# Patient Record
Sex: Male | Born: 1960 | Race: White | Hispanic: No | Marital: Married | Smoking: Never smoker
Health system: Southern US, Community
[De-identification: ages and names within clinical notes are randomized; demographics above are authoritative.]

## PROBLEM LIST (undated history)

## (undated) DIAGNOSIS — C73 Malignant neoplasm of thyroid gland: Secondary | ICD-10-CM

## (undated) DIAGNOSIS — F4310 Post-traumatic stress disorder, unspecified: Secondary | ICD-10-CM

## (undated) DIAGNOSIS — I10 Essential (primary) hypertension: Secondary | ICD-10-CM

## (undated) DIAGNOSIS — T8859XA Other complications of anesthesia, initial encounter: Secondary | ICD-10-CM

## (undated) DIAGNOSIS — C61 Malignant neoplasm of prostate: Secondary | ICD-10-CM

## (undated) DIAGNOSIS — T7840XA Allergy, unspecified, initial encounter: Secondary | ICD-10-CM

## (undated) DIAGNOSIS — J301 Allergic rhinitis due to pollen: Secondary | ICD-10-CM

## (undated) DIAGNOSIS — T148XXA Other injury of unspecified body region, initial encounter: Secondary | ICD-10-CM

## (undated) DIAGNOSIS — H9193 Unspecified hearing loss, bilateral: Secondary | ICD-10-CM

## (undated) DIAGNOSIS — J449 Chronic obstructive pulmonary disease, unspecified: Secondary | ICD-10-CM

## (undated) DIAGNOSIS — K219 Gastro-esophageal reflux disease without esophagitis: Secondary | ICD-10-CM

## (undated) DIAGNOSIS — L570 Actinic keratosis: Secondary | ICD-10-CM

## (undated) DIAGNOSIS — J45909 Unspecified asthma, uncomplicated: Secondary | ICD-10-CM

## (undated) DIAGNOSIS — T4145XA Adverse effect of unspecified anesthetic, initial encounter: Secondary | ICD-10-CM

## (undated) DIAGNOSIS — E785 Hyperlipidemia, unspecified: Secondary | ICD-10-CM

## (undated) HISTORY — DX: Chronic obstructive pulmonary disease, unspecified: J44.9

## (undated) HISTORY — DX: Essential (primary) hypertension: I10

## (undated) HISTORY — PX: INGUINAL HERNIA REPAIR: SUR1180

## (undated) HISTORY — DX: Malignant neoplasm of prostate: C61

## (undated) HISTORY — PX: THYROIDECTOMY, PARTIAL: SHX18

## (undated) HISTORY — DX: Hyperlipidemia, unspecified: E78.5

## (undated) HISTORY — DX: Actinic keratosis: L57.0

## (undated) HISTORY — PX: COLONOSCOPY W/ POLYPECTOMY: SHX1380

## (undated) HISTORY — DX: Malignant neoplasm of thyroid gland: C73

## (undated) HISTORY — DX: Post-traumatic stress disorder, unspecified: F43.10

## (undated) HISTORY — DX: Allergic rhinitis due to pollen: J30.1

## (undated) HISTORY — DX: Unspecified asthma, uncomplicated: J45.909

## (undated) HISTORY — DX: Allergy, unspecified, initial encounter: T78.40XA

## (undated) HISTORY — DX: Gastro-esophageal reflux disease without esophagitis: K21.9

---

## 1998-06-20 ENCOUNTER — Emergency Department (HOSPITAL_COMMUNITY): Admission: EM | Admit: 1998-06-20 | Discharge: 1998-06-20 | Payer: Self-pay | Admitting: Emergency Medicine

## 1998-06-20 ENCOUNTER — Encounter: Payer: Self-pay | Admitting: Emergency Medicine

## 2003-02-23 ENCOUNTER — Encounter: Payer: Self-pay | Admitting: *Deleted

## 2003-02-23 ENCOUNTER — Ambulatory Visit (HOSPITAL_COMMUNITY): Admission: RE | Admit: 2003-02-23 | Discharge: 2003-02-23 | Payer: Self-pay | Admitting: *Deleted

## 2004-03-27 ENCOUNTER — Ambulatory Visit (HOSPITAL_COMMUNITY): Admission: RE | Admit: 2004-03-27 | Discharge: 2004-03-27 | Payer: Self-pay | Admitting: *Deleted

## 2007-03-30 ENCOUNTER — Ambulatory Visit: Payer: Self-pay | Admitting: Unknown Physician Specialty

## 2008-07-11 ENCOUNTER — Ambulatory Visit: Payer: Self-pay | Admitting: Unknown Physician Specialty

## 2011-01-29 ENCOUNTER — Ambulatory Visit (INDEPENDENT_AMBULATORY_CARE_PROVIDER_SITE_OTHER): Payer: 59 | Admitting: Cardiovascular Disease

## 2011-01-29 ENCOUNTER — Encounter: Payer: Self-pay | Admitting: Cardiovascular Disease

## 2011-01-29 DIAGNOSIS — E785 Hyperlipidemia, unspecified: Secondary | ICD-10-CM

## 2011-01-29 DIAGNOSIS — I10 Essential (primary) hypertension: Secondary | ICD-10-CM

## 2011-01-29 MED ORDER — ATORVASTATIN CALCIUM 40 MG PO TABS: 40.0000 mg | ORAL_TABLET | Freq: Every day | ORAL | Status: DC

## 2011-01-29 MED ORDER — AMLODIPINE BESYLATE 10 MG PO TABS: 10.0000 mg | ORAL_TABLET | Freq: Every day | ORAL | Status: DC

## 2011-01-29 MED ORDER — LOSARTAN POTASSIUM 100 MG PO TABS: 100.0000 mg | ORAL_TABLET | Freq: Every day | ORAL | Status: DC

## 2011-01-29 NOTE — Assessment & Plan Note (Signed)
We will start him on losartan 100 mg daily. Have asked him to monitor his blood pressure closely. If he continues to be elevated, he can start amlodipine 5 mg daily, titrating to 10 mg daily if needed.

## 2011-01-29 NOTE — Assessment & Plan Note (Signed)
We will start Lipitor 20 mg daily, check of his cholesterol in 2 months and possible titration to 40 mg if needed.

## 2011-01-29 NOTE — Patient Instructions (Addendum)
You are doing well. Please start losartan 100 mg daily If blood pressure continues to be elevated after 5 day, start amlodipine 5 mg daily. Advance to 10 mg if BP continue to be high. Start lipitor 1/2 tab once blood pressure is well controlled.  Please call us if you have new issues that need to be addressed before your next appt.  Follow up in one year

## 2011-01-29 NOTE — Progress Notes (Signed)
   Patient ID: Austin Houston, male    DOB: 03-Dec-1960, 50 y.o.   MRN: [id number]  HPI Comments: Mr. Austin Houston, also known as "Austin Houston",  Works in the cardiac Cath Lab at [employer] with a history of hypertension and hyperlipidemia with no known coronary artery disease who presents to establish care. He was previously seen by myself at [facility].  He reports that he is doing well. He had come off his medications including atenolol, Vytorin and irbesartan/Avapro. He reports that his blood pressure had been well controlled. Recently, his blood pressure has been elevated. He would like to restart medications. He does take aspirin and Prilosec.  He reports that his triglycerides are 201, total cholesterol 222, LDL 112, HDL 70.  EKG shows normal sinus rhythm with rate 70 beats per minute with no significant ST-T wave changes, left axis deviation   Outpatient Encounter Prescriptions as of 01/29/2011  Medication Sig Dispense Refill  . Amoxicillin-Pot Clavulanate (AUGMENTIN PO) Take 875 mg by mouth once. Take one tablet daily for 10 days.       Marland Kitchen aspirin 81 MG tablet Take 81 mg by mouth daily.           Review of Systems  Constitutional: Negative.   HENT: Negative.   Eyes: Negative.   Respiratory: Negative.   Cardiovascular: Negative.   Gastrointestinal: Negative.   Musculoskeletal: Negative.   Skin: Negative.   Neurological: Negative.   Hematological: Negative.   Psychiatric/Behavioral: Negative.   All other systems reviewed and are negative.    BP 160/110  Pulse 92  Ht 6' (1.829 m)  Wt 214 lb (97.07 kg)  BMI 29.02 kg/m2  Physical Exam  Nursing note and vitals reviewed. Constitutional: He is oriented to person, place, and time. He appears well-developed and well-nourished.  HENT:  Head: Normocephalic.  Nose: Nose normal.  Mouth/Throat: Oropharynx is clear and moist.  Eyes: Conjunctivae are normal. Pupils are equal, round, and reactive to light.  Neck:  Normal range of motion. Neck supple. No JVD present.  Cardiovascular: Normal rate, regular rhythm, S1 normal, S2 normal, normal heart sounds and intact distal pulses.  Exam reveals no gallop and no friction rub.   No murmur heard. Pulmonary/Chest: Effort normal and breath sounds normal. No respiratory distress. He has no wheezes. He has no rales. He exhibits no tenderness.  Abdominal: Soft. Bowel sounds are normal. He exhibits no distension. There is no tenderness.  Musculoskeletal: Normal range of motion. He exhibits no edema and no tenderness.  Lymphadenopathy:    He has no cervical adenopathy.  Neurological: He is alert and oriented to person, place, and time. Coordination normal.  Skin: Skin is warm and dry. No rash noted. No erythema.  Psychiatric: He has a normal mood and affect. His behavior is normal. Judgment and thought content normal.           Assessment and Plan

## 2012-02-01 ENCOUNTER — Other Ambulatory Visit: Payer: Self-pay | Admitting: *Deleted

## 2012-02-01 DIAGNOSIS — I10 Essential (primary) hypertension: Secondary | ICD-10-CM

## 2012-02-01 MED ORDER — LOSARTAN POTASSIUM 100 MG PO TABS: 100.0000 mg | ORAL_TABLET | Freq: Every day | ORAL | Status: DC

## 2012-02-01 MED ORDER — AMLODIPINE BESYLATE 10 MG PO TABS: 10.0000 mg | ORAL_TABLET | Freq: Every day | ORAL | Status: DC

## 2012-02-01 NOTE — Telephone Encounter (Signed)
Refilled Losartan and Amlodipine.

## 2012-02-03 ENCOUNTER — Other Ambulatory Visit: Payer: Self-pay | Admitting: Cardiovascular Disease

## 2012-02-03 NOTE — Telephone Encounter (Signed)
Refilled Lipitor

## 2012-11-24 ENCOUNTER — Encounter: Payer: Self-pay | Admitting: Family Medicine

## 2012-11-24 ENCOUNTER — Telehealth: Payer: Self-pay | Admitting: Family Medicine

## 2012-11-24 ENCOUNTER — Ambulatory Visit (INDEPENDENT_AMBULATORY_CARE_PROVIDER_SITE_OTHER): Payer: 59 | Admitting: Family Medicine

## 2012-11-24 VITALS — BP 140/100 | HR 80 | Temp 98.0°F | Wt 223.0 lb

## 2012-11-24 DIAGNOSIS — E785 Hyperlipidemia, unspecified: Secondary | ICD-10-CM

## 2012-11-24 DIAGNOSIS — K219 Gastro-esophageal reflux disease without esophagitis: Secondary | ICD-10-CM

## 2012-11-24 DIAGNOSIS — F17200 Nicotine dependence, unspecified, uncomplicated: Secondary | ICD-10-CM

## 2012-11-24 DIAGNOSIS — C73 Malignant neoplasm of thyroid gland: Secondary | ICD-10-CM

## 2012-11-24 DIAGNOSIS — F4310 Post-traumatic stress disorder, unspecified: Secondary | ICD-10-CM

## 2012-11-24 DIAGNOSIS — F17220 Nicotine dependence, chewing tobacco, uncomplicated: Secondary | ICD-10-CM

## 2012-11-24 DIAGNOSIS — I10 Essential (primary) hypertension: Secondary | ICD-10-CM

## 2012-11-24 DIAGNOSIS — J301 Allergic rhinitis due to pollen: Secondary | ICD-10-CM

## 2012-11-24 MED ORDER — VARENICLINE TARTRATE 1 MG PO TABS: 1.0000 mg | ORAL_TABLET | Freq: Two times a day (BID) | ORAL | Status: DC

## 2012-11-24 MED ORDER — ALBUTEROL SULFATE HFA 108 (90 BASE) MCG/ACT IN AERS: 2.0000 | INHALATION_SPRAY | Freq: Four times a day (QID) | RESPIRATORY_TRACT | Status: DC | PRN

## 2012-11-24 MED ORDER — VARENICLINE TARTRATE 0.5 MG X 11 & 1 MG X 42 PO MISC: ORAL | Status: DC

## 2012-11-24 NOTE — Progress Notes (Signed)
[facility] [address] [place] [address] Phone: [phone] Fax: [phone]  Date:  11/24/2012   Name:  Austin Houston   DOB:  [birth year]   MRN:  [id number] Gender: male Age: 52 y.o.  Primary Physician:  Hannah Beat, MD  Evaluating MD: Hannah Beat, MD   Chief Complaint: Establish Care   History of Present Illness:  Austin Houston is a 52 y.o. pleasant patient who presents with the following:  [employer], then   Pleasant gentleman with h/o follicular adenoma of the thyroid, stable, with generally stable HTN and hyperlipidemia here to establish care.   On last tour of duty, started chewing SNUS, and has not been able to stop doing it this time.  He also has stable GERD and intermittent allergies.  Patient Active Problem List   Diagnosis Date Noted  . Chewing tobacco nicotine dependence, uncomplicated 11/25/2012  . Follicular cancer of thyroid   . GERD (gastroesophageal reflux disease)   . Allergic rhinitis due to pollen   . Hypertension   . Hyperlipidemia   . PTSD (post-traumatic stress disorder)     Past Medical History  Diagnosis Date  . Follicular cancer of thyroid     s/p partial thyroidectomy (follicular adenoma)  . GERD (gastroesophageal reflux disease)   . Allergic rhinitis due to pollen   . Hypertension   . Hyperlipidemia   . PTSD (post-traumatic stress disorder)     s/p multiple active deployments for [employer]    Past Surgical History  Procedure Laterality Date  . Inguinal hernia repair      right  . Thyroidectomy, partial      [facility])    History   Social History  . Marital Status: Married    Spouse Name: N/A    Number of Children: N/A  . Years of Education: N/A   Occupational History  . 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 Smoking status: Never Smoker   . Smokeless tobacco: Current User    Types: Snuff  .  Alcohol Use: 0.5 oz/week    1 drink(s) per week  . Drug Use: No  . Sexually Active: Yes -- Male partner(s)   Other Topics Concern  . 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   Active duty and Associate Professor    Family History  Problem Relation Age of Onset  . Heart disease Paternal Grandmother   . Prostate cancer Father     Recurrent x 3  . Hyperlipidemia Mother   . Hyperlipidemia Father   . Hypertension Maternal Grandmother   . Hypertension Maternal Grandfather   . Sudden death Brother 81    Allergies  Allergen Reactions  . Vioxx (Rofecoxib) Anaphylaxis  . Celebrex (Celecoxib) Rash  . Codeine Rash    Current Outpatient Prescriptions on File Prior to Visit  Medication Sig Dispense Refill  . amLODipine (NORVASC) 10 MG tablet Take 10 mg by mouth daily.      Marland Kitchen aspirin 81 MG tablet Take 81 mg by mouth daily.        Marland Kitchen atorvastatin (LIPITOR) 40 MG tablet TAKE 1 TABLET BY MOUTH DAILY  90 tablet  4  . losartan (COZAAR) 100 MG tablet Take 1 tablet (100 mg total) by mouth daily.  90 tablet  4   No current facility-administered medications on file prior to visit.     Review of Systems:   GEN: No acute illnesses, no fevers, chills. GI: No n/v/d, eating normally Pulm: No SOB Interactive and getting along well at home.  Otherwise, ROS is as per the HPI.   Physical Examination: BP 140/100  Pulse 80  Temp(Src) 98 F (36.7 C)  Wt 223 lb (101.152 kg)  BMI 30.24 kg/m2  Ideal Body Weight:     GEN: WDWN, NAD, Non-toxic, A & O x 3 HEENT: Atraumatic, Normocephalic. Neck supple. No masses, No LAD. Ears and Nose: No external deformity. CV: RRR, No M/G/R. No JVD. No thrill. No extra heart sounds. PULM: CTA B, mild tightness with minimal wheeze. No retractions. No resp. distress. No accessory muscle use. EXTR: No c/c/e NEURO  Normal gait.  PSYCH: Normally interactive. Conversant. Not depressed or anxious appearing.  Calm demeanor.    Assessment and Plan:  Follicular cancer of thyroid  GERD (gastroesophageal reflux disease)  Allergic rhinitis due to pollen  Hypertension  Hyperlipidemia  PTSD (post-traumatic stress disorder)  Chewing tobacco nicotine dependence, uncomplicated  Chantix. May have minor rad - prn ventolin  F/u full cpx in fall  Orders Today:  No orders of the defined types were placed in this encounter.    Updated Medication List: (Includes new medications, updates to list, dose adjustments) Meds ordered this encounter  Medications  . omeprazole (PRILOSEC) 40 MG capsule    Sig: Take 40 mg by mouth daily.  . cetirizine (ZYRTEC) 10 MG tablet    Sig: Take 10 mg by mouth daily.  . naproxen sodium (ANAPROX) 220 MG tablet    Sig: Take 220 mg by mouth daily.  . varenicline (CHANTIX STARTING MONTH PAK) 0.5 MG X 11 & 1 MG X 42 tablet    Sig: Take one 0.5mg  tablet once daily for 3 days, then increase to one 0.5mg  twice daily for 3 days, then increase to one 1mg  tablet twice daily.    Dispense:  53 tablet    Refill:  0  . varenicline (CHANTIX) 1 MG tablet    Sig: Take 1 tablet (1 mg total) by mouth 2 (two) times daily.    Dispense:  60 tablet    Refill:  3  . albuterol (PROVENTIL HFA;VENTOLIN HFA) 108 (90 BASE) MCG/ACT inhaler    Sig: Inhale 2 puffs into the lungs every 6 (six) hours as needed for wheezing.    Dispense:  1 Inhaler    Refill:  3    Medications Discontinued: Medications Discontinued During This Encounter  Medication Reason  . amLODipine (NORVASC) 10 MG tablet Error  . Amoxicillin-Pot Clavulanate (AUGMENTIN PO) Error  . atorvastatin (LIPITOR) 40 MG tablet Error  . losartan (COZAAR) 100 MG tablet Error      Signed, Marijose Curington T. Jalynne Persico, MD 11/24/2012 2:11 PM

## 2012-11-24 NOTE — Telephone Encounter (Signed)
Call  i do not know how to do that in Epic, but I can easily mail an order to him that he can use at the [facility] lab on a script.

## 2012-11-24 NOTE — Telephone Encounter (Signed)
Pt has a CPE scheduled for 04/13/2013. He works at the [employer] and would like to have his lab work drawn there. He wants to know can you put in the order so he can have his CPE lab work drawn there?

## 2012-11-25 ENCOUNTER — Encounter: Payer: Self-pay | Admitting: Family Medicine

## 2012-11-25 DIAGNOSIS — I10 Essential (primary) hypertension: Secondary | ICD-10-CM | POA: Insufficient documentation

## 2012-11-25 DIAGNOSIS — E785 Hyperlipidemia, unspecified: Secondary | ICD-10-CM | POA: Insufficient documentation

## 2012-11-25 DIAGNOSIS — E782 Mixed hyperlipidemia: Secondary | ICD-10-CM | POA: Insufficient documentation

## 2012-11-25 DIAGNOSIS — Z87891 Personal history of nicotine dependence: Secondary | ICD-10-CM | POA: Insufficient documentation

## 2012-11-25 DIAGNOSIS — C73 Malignant neoplasm of thyroid gland: Secondary | ICD-10-CM | POA: Insufficient documentation

## 2012-11-25 DIAGNOSIS — K219 Gastro-esophageal reflux disease without esophagitis: Secondary | ICD-10-CM | POA: Insufficient documentation

## 2012-11-25 DIAGNOSIS — J301 Allergic rhinitis due to pollen: Secondary | ICD-10-CM | POA: Insufficient documentation

## 2012-11-25 DIAGNOSIS — F4310 Post-traumatic stress disorder, unspecified: Secondary | ICD-10-CM | POA: Insufficient documentation

## 2012-11-28 NOTE — Telephone Encounter (Signed)
Patient is fine with order being mailed

## 2012-12-01 ENCOUNTER — Other Ambulatory Visit: Payer: Self-pay | Admitting: Family Medicine

## 2012-12-01 MED ORDER — NONFORMULARY OR COMPOUNDED ITEM: Status: DC

## 2012-12-01 NOTE — Telephone Encounter (Signed)
Done, ok to mail   Hannah Beat, MD 12/01/2012, 3:14 PM

## 2012-12-02 NOTE — Telephone Encounter (Signed)
Mailed to patient

## 2012-12-22 ENCOUNTER — Other Ambulatory Visit: Payer: Self-pay | Admitting: Family Medicine

## 2012-12-22 MED ORDER — OMEPRAZOLE 40 MG PO CPDR: 40.0000 mg | DELAYED_RELEASE_CAPSULE | Freq: Every day | ORAL | Status: DC

## 2013-02-20 ENCOUNTER — Other Ambulatory Visit: Payer: Self-pay | Admitting: Cardiovascular Disease

## 2013-02-20 ENCOUNTER — Telehealth: Payer: Self-pay | Admitting: *Deleted

## 2013-02-20 ENCOUNTER — Other Ambulatory Visit: Payer: Self-pay | Admitting: *Deleted

## 2013-02-20 DIAGNOSIS — I10 Essential (primary) hypertension: Secondary | ICD-10-CM

## 2013-02-20 MED ORDER — LOSARTAN POTASSIUM 100 MG PO TABS: 100.0000 mg | ORAL_TABLET | Freq: Every day | ORAL | Status: DC

## 2013-02-20 MED ORDER — AMLODIPINE BESYLATE 10 MG PO TABS: 10.0000 mg | ORAL_TABLET | Freq: Every day | ORAL | Status: DC

## 2013-02-20 MED ORDER — ATORVASTATIN CALCIUM 40 MG PO TABS: ORAL_TABLET | ORAL | Status: DC

## 2013-02-20 NOTE — Telephone Encounter (Signed)
Pt has not been seen since 2012 received refill request for Lipitor, Norvasc and Cozaar. LMTCB pt needs to schedule future appointment with Dr. Mariah Milling.

## 2013-02-21 ENCOUNTER — Telehealth: Payer: Self-pay

## 2013-02-21 NOTE — Telephone Encounter (Signed)
Pt called for status of refills; spoke with shasta at [facility] outpt pharmacy and meds ready for pick up. Pt was notified.

## 2013-02-21 NOTE — Telephone Encounter (Signed)
Pt called back regarding rx's. Per Jearld Adjutant, pt can have 30 day refill, but will need to schedule appt, pt informed, and states he will go to dr copland's office, since he doesn't have to pay to be seen there, but does have to pay copay, deductible, for dr Mariah Milling.

## 2013-02-24 ENCOUNTER — Telehealth: Payer: Self-pay | Admitting: Vascular Surgery

## 2013-02-24 NOTE — Telephone Encounter (Addendum)
Message copied by Fredrich Birks on Fri Feb 24, 2013  3:30 PM ------      Message from: Melene Plan      Created: Fri Feb 24, 2013 12:26 PM       Per Dr Kipp Brood make him an office visit with anyone next week. Also, needs U/S reflux study Bil LE for painful, bulging VV. To start Conservative Treatment & have 3 month follow up with him.He wants to have LA before end of year. His # is [phone]. He is off on Wed but can trade if needs to. 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 ------  02/24/13: left message for patient, dpm

## 2013-02-27 ENCOUNTER — Telehealth: Payer: Self-pay | Admitting: Vascular Surgery

## 2013-02-27 NOTE — Telephone Encounter (Signed)
Message copied by Jena Gauss on Mon Feb 27, 2013  3:53 PM ------      Message from: Micki Riley      Created: Mon Feb 27, 2013  8:06 AM       I will forward this to the appointment desk since I don't schedule labs. Thx      ----- Message -----         From: Melene Plan, RN         Sent: 02/24/2013  12:26 PM           To: Micki Riley, RN, Fredrich Birks            Per Dr Kipp Brood make him an office visit with anyone next week. Also, needs U/S reflux study Bil LE for painful, bulging VV. To start Conservative Treatment & have 3 month follow up with him.He wants to have LA before end of year. His # is [phone]. He is off on Wed but can trade if needs to. 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       ------

## 2013-02-28 ENCOUNTER — Encounter: Payer: Self-pay | Admitting: Vascular Surgery

## 2013-02-28 ENCOUNTER — Other Ambulatory Visit: Payer: Self-pay | Admitting: *Deleted

## 2013-02-28 DIAGNOSIS — M79609 Pain in unspecified limb: Secondary | ICD-10-CM

## 2013-03-01 ENCOUNTER — Ambulatory Visit (INDEPENDENT_AMBULATORY_CARE_PROVIDER_SITE_OTHER): Payer: 59 | Admitting: Vascular Surgery

## 2013-03-01 ENCOUNTER — Encounter: Payer: Self-pay | Admitting: Vascular Surgery

## 2013-03-01 ENCOUNTER — Encounter (INDEPENDENT_AMBULATORY_CARE_PROVIDER_SITE_OTHER): Payer: 59 | Admitting: *Deleted

## 2013-03-01 VITALS — BP 157/92 | HR 59 | Ht 72.0 in | Wt 227.3 lb

## 2013-03-01 DIAGNOSIS — M79609 Pain in unspecified limb: Secondary | ICD-10-CM

## 2013-03-01 DIAGNOSIS — I83893 Varicose veins of bilateral lower extremities with other complications: Secondary | ICD-10-CM | POA: Insufficient documentation

## 2013-03-01 NOTE — Progress Notes (Signed)
[facility]ascular and [facility]in Specialist of [facility]  Patient name: Austin Houston MRN: [id number] DOB: May 21, 1961 Sex: male  REASON FOR CONSULT: painful varicose veins  HPI: Austin Houston is a 52 y.o. 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 His symptoms are more significant on the right side. 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 He experiences aching pain in the right leg associated with standing and relieved with elevation. He has been wearing knee-high compression stockings for a long time and they do help some at work. He did have an episode of superficial thrombophlebitis of his right leg in 2010. He is unaware of any history of DVT. He denies any problems with bleeding from his varicosities.  Past Medical History  Diagnosis Date  . Follicular cancer of thyroid     s/p partial thyroidectomy (follicular adenoma)  . GERD (gastroesophageal reflux disease)   . Allergic rhinitis due to pollen   . Hypertension   . Hyperlipidemia   . PTSD (post-traumatic stress disorder)     s/p multiple active deployments for [employer]   Family History  Problem Relation Age of Onset  . Heart disease Paternal Grandmother   . Prostate cancer Father     Recurrent x 3  . Hyperlipidemia Father   . Other Father     varicose veins  . Hyperlipidemia Mother   . Other Mother     varicose veins  . Hypertension Maternal Grandmother   . Hypertension Maternal Grandfather   . Sudden death Brother 66   SOCIAL HISTORY: History  Substance Use Topics  . Smoking status: Never Smoker   . Smokeless tobacco: Former Neurosurgeon    Types: Snuff  . Alcohol Use: 6.0 oz/week    10 Cans of beer per week   Allergies  Allergen Reactions  . Vioxx [Rofecoxib] Anaphylaxis  . Celebrex [Celecoxib] Rash  . Codeine Rash   Current Outpatient Prescriptions  Medication Sig Dispense Refill  . albuterol (PROVENTIL HFA;VENTOLIN HFA) 108 (90  BASE) MCG/ACT inhaler Inhale 2 puffs into the lungs every 6 (six) hours as needed for wheezing.  1 Inhaler  3  . amLODipine (NORVASC) 10 MG tablet Take 1 tablet (10 mg total) by mouth daily.  90 tablet  1  . aspirin 81 MG tablet Take 81 mg by mouth daily.        Marland Kitchen atorvastatin (LIPITOR) 40 MG tablet TAKE 1 TABLET BY MOUTH DAILY  90 tablet  1  . cetirizine (ZYRTEC) 10 MG tablet Take 10 mg by mouth daily.      Marland Kitchen losartan (COZAAR) 100 MG tablet Take 1 tablet (100 mg total) by mouth daily.  90 tablet  1  . naproxen sodium (ANAPROX) 220 MG tablet Take 220 mg by mouth daily.      . NONFORMULARY OR COMPOUNDED ITEM Epic Account: [id number] Lab Studies: BMP, CBC with diff, HFP: v58.69 FLP: 272.4 PSA: v76.44  1 each  0  . omeprazole (PRILOSEC) 40 MG capsule Take 1 capsule (40 mg total) by mouth daily.  30 capsule  11  . varenicline (CHANTIX STARTING MONTH PAK) 0.5 MG X 11 & 1 MG X 42 tablet Take one 0.5mg  tablet once daily for 3 days, then increase to one 0.5mg  twice daily for 3 days, then increase to one 1mg  tablet twice daily.  53 tablet  0  . varenicline (CHANTIX) 1 MG tablet Take 1 tablet (  1 mg total) by mouth 2 (two) times daily.  60 tablet  3   No current facility-administered medications for this visit.   REVIEW OF SYSTEMS: Arly.Keller ] denotes positive finding; [  ] denotes negative finding  CARDIOVASCULAR:  [ ]  chest pain   [ ]  chest pressure   [ ]  palpitations   [ ]  orthopnea   [ ]  dyspnea on exertion   [ ]  claudication   [ ]  rest pain   [ ]  DVT   [ ]  phlebitis PULMONARY:   [ ]  productive cough   [ ]  asthma   [ ]  wheezing NEUROLOGIC:   [ ]  weakness  [ ]  paresthesias  [ ]  aphasia  [ ]  amaurosis  [ ]  dizziness HEMATOLOGIC:   [ ]  bleeding problems   [ ]  clotting disorders MUSCULOSKELETAL:  [ ]  joint pain   [ ]  joint swelling Arly.Keller ] leg swelling GASTROINTESTINAL: [ ]   blood in stool  [ ]   hematemesis GENITOURINARY:  [ ]   dysuria  [ ]   hematuria PSYCHIATRIC:  [ ]  history of major depression INTEGUMENTARY:  [  ] rashes  [ ]  ulcers CONSTITUTIONAL:  [ ]  fever   [ ]  chills  PHYSICAL EXAM: Filed Vitals:   03/01/13 1055  BP: 157/92  Pulse: 59  Height: 6' (1.829 m)  Weight: 227 lb 4.8 oz (103.103 kg)  SpO2: 100%   Body mass index is 30.82 kg/(m^2). GENERAL: The patient is a well-nourished male, in no acute distress. The vital signs are documented above. CARDIOVASCULAR: There is a regular rate and rhythm. I do not detect carotid bruits. He has palpable pedal pulses bilaterally. PULMONARY: There is good air exchange bilaterally without wheezing or rales. ABDOMEN: Soft and non-tender with normal pitched bowel sounds.  MUSCULOSKELETAL: There are no major deformities or cyanosis. NEUROLOGIC: No focal weakness or paresthesias are detected. SKIN: he has some large truncal varicosities along the medial aspect of his right leg. There is no evidence of phlebitis currently. PSYCHIATRIC: The patient has a normal affect.  DATA:  I have independently interpreted his venous duplex scan. He has no evidence of DVT in the right or left lower extremity. He had some partially occlusive thrombus in the left proximal small saphenous vein. He had venous incompetence in the right greater saphenous vein and also in the deep system.  MEDICAL ISSUES: This patient has painful varicose veins especially the right lower extremity. We have discussed the importance of intermittent leg elevation and compression therapy. I have written him a prescription for a thigh high compression stockings with a 20-30 mm of mercury pressure gradient. He will attempt treatment with this. We'll arrange for a follow up visit in 6 months. His symptoms progress and he could potentially be considered for laser ablation of the right greater saphenous vein. He knows to call sooner if he has problems.  Jazlen Ogarro S [facility]: [phone]

## 2013-03-01 NOTE — Addendum Note (Signed)
Addended by: Adria Dill L on: 03/01/2013 03:53 PM   Modules accepted: Orders

## 2013-04-13 ENCOUNTER — Other Ambulatory Visit (INDEPENDENT_AMBULATORY_CARE_PROVIDER_SITE_OTHER): Payer: 59

## 2013-04-13 ENCOUNTER — Encounter: Payer: 59 | Admitting: Family Medicine

## 2013-04-13 DIAGNOSIS — Z13220 Encounter for screening for lipoid disorders: Secondary | ICD-10-CM

## 2013-04-13 DIAGNOSIS — Z125 Encounter for screening for malignant neoplasm of prostate: Secondary | ICD-10-CM

## 2013-04-13 DIAGNOSIS — R5381 Other malaise: Secondary | ICD-10-CM

## 2013-04-13 LAB — CBC WITH DIFFERENTIAL/PLATELET
Basophils Relative: 0.3 % (ref 0.0–3.0)
Eosinophils Relative: 7.5 % — ABNORMAL HIGH (ref 0.0–5.0)
HCT: 41.7 % (ref 39.0–52.0)
Hemoglobin: 14.3 g/dL (ref 13.0–17.0)
Lymphocytes Relative: 28 % (ref 12.0–46.0)
Lymphs Abs: 1.2 10*3/uL (ref 0.7–4.0)
Monocytes Relative: 8.4 % (ref 3.0–12.0)
Neutro Abs: 2.4 10*3/uL (ref 1.4–7.7)
RBC: 4.5 Mil/uL (ref 4.22–5.81)

## 2013-04-14 LAB — HEPATIC FUNCTION PANEL
ALT: 75 U/L — ABNORMAL HIGH (ref 0–53)
AST: 47 U/L — ABNORMAL HIGH (ref 0–37)
Albumin: 4.2 g/dL (ref 3.5–5.2)
Alkaline Phosphatase: 54 U/L (ref 39–117)
Bilirubin, Direct: 0.1 mg/dL (ref 0.0–0.3)
Total Bilirubin: 0.7 mg/dL (ref 0.3–1.2)
Total Protein: 6.7 g/dL (ref 6.0–8.3)

## 2013-04-14 LAB — BASIC METABOLIC PANEL
CO2: 28 mEq/L (ref 19–32)
Calcium: 9.1 mg/dL (ref 8.4–10.5)
Chloride: 104 mEq/L (ref 96–112)
Creatinine, Ser: 1.2 mg/dL (ref 0.4–1.5)
Glucose, Bld: 87 mg/dL (ref 70–99)
Sodium: 136 mEq/L (ref 135–145)

## 2013-04-14 LAB — LIPID PANEL
HDL: 57 mg/dL (ref >39.00)
LDL Cholesterol: 78 mg/dL (ref 0–99)
Total CHOL/HDL Ratio: 3
Triglycerides: 50 mg/dL (ref 0.0–149.0)

## 2013-04-14 LAB — PSA: PSA: 2.5 ng/mL (ref 0.10–4.00)

## 2013-04-17 ENCOUNTER — Encounter: Payer: Self-pay | Admitting: Family Medicine

## 2013-04-17 ENCOUNTER — Ambulatory Visit (INDEPENDENT_AMBULATORY_CARE_PROVIDER_SITE_OTHER): Payer: 59 | Admitting: Family Medicine

## 2013-04-17 VITALS — BP 130/80 | HR 63 | Temp 97.8°F | Ht 73.0 in | Wt 222.5 lb

## 2013-04-17 DIAGNOSIS — Z1211 Encounter for screening for malignant neoplasm of colon: Secondary | ICD-10-CM

## 2013-04-17 DIAGNOSIS — Z0000 Encounter for general adult medical examination without abnormal findings: Secondary | ICD-10-CM

## 2013-04-17 DIAGNOSIS — R062 Wheezing: Secondary | ICD-10-CM

## 2013-04-17 DIAGNOSIS — Z125 Encounter for screening for malignant neoplasm of prostate: Secondary | ICD-10-CM | POA: Insufficient documentation

## 2013-04-17 DIAGNOSIS — R059 Cough, unspecified: Secondary | ICD-10-CM

## 2013-04-17 DIAGNOSIS — R748 Abnormal levels of other serum enzymes: Secondary | ICD-10-CM

## 2013-04-17 DIAGNOSIS — R05 Cough: Secondary | ICD-10-CM

## 2013-04-17 NOTE — Patient Instructions (Addendum)
REFERRAL: GO THE THE FRONT ROOM AT THE ENTRANCE OF OUR CLINIC, NEAR CHECK IN. ASK FOR MARION. SHE WILL HELP YOU SET UP YOUR REFERRAL. DATE: TIME:  

## 2013-04-17 NOTE — Progress Notes (Signed)
[facility] [address] [place] [address] Phone: [phone] Fax: [phone]  Date:  04/17/2013   Name:  Austin Houston   DOB:  [birth year]   MRN:  [id number] Gender: male Age: 52 y.o.  Primary Physician:  Hannah Beat, MD   Chief Complaint: Annual Exam   History of Present Illness:  [name] is a 52 y.o. pleasant patient who presents with the following:  CPX:  Flu Colon -   Preventative Health Maintenance Visit:  Health Maintenance Summary Reviewed and updated, unless pt declines services.  Tobacco History Reviewed. Alcohol: No concerns, no excessive use Exercise Habits: Some activity, rec at least 30 mins 5 times a week STD concerns: no risk or activity to increase risk Drug Use: None Encouraged self-testicular check  Health Maintenance  Topic Date Due  . Colonoscopy  07/11/2012  . Influenza Vaccine  02/10/2014  . Tetanus/tdap  07/13/2016    Labs reviewed with the patient.   Lipids:    Component Value Date/Time   CHOL 145 04/13/2013 1530   TRIG 50.0 04/13/2013 1530   HDL 57.00 04/13/2013 1530   VLDL 10.0 04/13/2013 1530   CHOLHDL 3 04/13/2013 1530    CBC:    Component Value Date/Time   WBC 4.3* 04/13/2013 1530   HGB 14.3 04/13/2013 1530   HCT 41.7 04/13/2013 1530   PLT 274.0 04/13/2013 1530   MCV 92.6 04/13/2013 1530   NEUTROABS 2.4 04/13/2013 1530   LYMPHSABS 1.2 04/13/2013 1530   MONOABS 0.4 04/13/2013 1530   EOSABS 0.3 04/13/2013 1530   BASOSABS 0.0 04/13/2013 1530    Basic Metabolic Panel:    Component Value Date/Time   NA 136 04/13/2013 1530   K 4.7 04/13/2013 1530   CL 104 04/13/2013 1530   CO2 28 04/13/2013 1530   BUN 13 04/13/2013 1530   CREATININE 1.2 04/13/2013 1530   GLUCOSE 87 04/13/2013 1530   CALCIUM 9.1 04/13/2013 1530    Lab Results  Component Value Date   ALT 75* 04/13/2013   AST 47* 04/13/2013   ALKPHOS 54 04/13/2013   BILITOT 0.7 04/13/2013    Lab Results  Component Value Date   PSA  2.50 04/13/2013     Saw Dr. Edilia Bo, f/u next year. Wearing compression stockings. Helping a little bit.  Asthma / cough? In the last two weeks, was coughing a lot at night. Was using his albuterol about every day and tightness in his chest. Sometimes in the middle of the night and could hear in his chest. Inhaler would make it better.   He will intermittently cough at night. Up and walking during the day, he will feel it. Most of the time, not. Sometimes wheezing during the day.   PFT's.    Patient Active Problem List   Diagnosis Date Noted  . Pain in limb 03/01/2013  . Varicose veins of lower extremities with other complications 03/01/2013  . Chewing tobacco nicotine dependence, uncomplicated 11/25/2012  . Follicular cancer of thyroid   . GERD (gastroesophageal reflux disease)   . Allergic rhinitis due to pollen   . Hypertension   . Hyperlipidemia   . PTSD (post-traumatic stress disorder)     Past Medical History  Diagnosis Date  . Follicular cancer of thyroid     s/p partial thyroidectomy (follicular adenoma)  . GERD (gastroesophageal reflux disease)   . Allergic rhinitis due to pollen   . Hypertension   . Hyperlipidemia   . PTSD (  post-traumatic stress disorder)     s/p multiple active deployments for [employer]    Past Surgical History  Procedure Laterality Date  . Inguinal hernia repair      right  . Thyroidectomy, partial      [facility])    History   Social History  . Marital Status: Married    Spouse Name: N/A    Number of Children: N/A  . Years of Education: N/A   Occupational History  . 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 Smoking status: Never Smoker   . Smokeless tobacco: Former Neurosurgeon    Types: Snuff    Quit date: 12/03/2012  . Alcohol Use: 6.0 oz/week    10 Cans of beer per week  . Drug Use: No  . Sexual Activity: Yes    Partners: Female   Other Topics Concern  .  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 Heart disease Paternal Grandmother   . Prostate cancer Father     Recurrent x 3  . Hyperlipidemia Father   . Other Father     varicose veins  . Hyperlipidemia Mother   . Other Mother     varicose veins  . Hypertension Maternal Grandmother   . Hypertension Maternal Grandfather   . Sudden death Brother 68    Allergies  Allergen Reactions  . Vioxx [Rofecoxib] Anaphylaxis  . Celebrex [Celecoxib] Rash  . Codeine Rash  . Doxycycline Rash    Medication list has been reviewed and updated.  Outpatient Prescriptions Prior to Visit  Medication Sig Dispense Refill  . albuterol (PROVENTIL HFA;VENTOLIN HFA) 108 (90 BASE) MCG/ACT inhaler Inhale 2 puffs into the lungs every 6 (six) hours as needed for wheezing.  1 Inhaler  3  . amLODipine (NORVASC) 10 MG tablet Take 1 tablet (10 mg total) by mouth daily.  90 tablet  1  . atorvastatin (LIPITOR) 40 MG tablet TAKE 1 TABLET BY MOUTH DAILY  90 tablet  1  . cetirizine (ZYRTEC) 10 MG tablet Take 10 mg by mouth daily.      Marland Kitchen losartan (COZAAR) 100 MG tablet Take 1 tablet (100 mg total) by mouth daily.  90 tablet  1  . naproxen sodium (ANAPROX) 220 MG tablet Take 220 mg by mouth daily.      . NONFORMULARY OR COMPOUNDED ITEM Epic Account: [id number] Lab Studies: BMP, CBC with diff, HFP: v58.69 FLP: 272.4 PSA: v76.44  1 each  0  . omeprazole (PRILOSEC) 40 MG capsule Take 1 capsule (40 mg total) by mouth daily.  30 capsule  11  . aspirin 81 MG tablet Take 81 mg by mouth daily.        . varenicline (CHANTIX STARTING MONTH PAK) 0.5 MG X 11 & 1 MG X 42 tablet Take one 0.5mg  tablet once daily for 3 days, then increase to one 0.5mg  twice daily for 3 days, then increase to one 1mg   tablet twice daily.  53 tablet  0  . varenicline (CHANTIX) 1 MG tablet Take 1 tablet (1 mg total) by mouth 2 (two) times  daily.  60 tablet  3   No facility-administered medications prior to visit.    Review of Systems:   General: Denies fever, chills, sweats. No significant weight loss. Eyes: Denies blurring,significant itching ENT: Denies earache, sore throat, and hoarseness. Cardiovascular: Denies chest pains, palpitations, dyspnea on exertion Respiratory: as above Breast: no concerns about lumps GI: Denies nausea, vomiting, diarrhea, constipation, change in bowel habits, abdominal pain, melena, hematochezia GU: Denies penile discharge, ED, urinary flow / outflow problems. No STD concerns. Musculoskeletal: Denies back pain, joint pain Derm: Denies rash, itching Neuro: Denies  paresthesias, frequent falls, frequent headaches Psych: Denies depression, anxiety Endocrine: Denies cold intolerance, heat intolerance, polydipsia Heme: Denies enlarged lymph nodes Allergy: No hayfever   Physical Examination: BP 130/80  Pulse 63  Temp(Src) 97.8 F (36.6 C) (Oral)  Ht 6\' 1"  (1.854 m)  Wt 222 lb 8 oz (100.925 kg)  BMI 29.36 kg/m2  Ideal Body Weight: Weight in (lb) to have BMI = 25: 189.1   Wt Readings from Last 3 Encounters:  04/17/13 222 lb 8 oz (100.925 kg)  03/01/13 227 lb 4.8 oz (103.103 kg)  11/24/12 223 lb (101.152 kg)    GEN: well developed, well nourished, no acute distress Eyes: conjunctiva and lids normal, PERRLA, EOMI ENT: TM clear, nares clear, oral exam WNL Neck: supple, no lymphadenopathy, no thyromegaly, no JVD Pulm: clear to auscultation and percussion, respiratory effort normal CV: regular rate and rhythm, S1-S2, no murmur, rub or gallop, no bruits, peripheral pulses normal and symmetric, no cyanosis, clubbing, edema or varicosities Chest: no scars, masses GI: soft, non-tender; no hepatosplenomegaly, masses; active bowel sounds all quadrants GU: no hernia,  testicular mass, penile discharge, or prostate enlargement Lymph: no cervical, axillary or inguinal adenopathy MSK: gait normal, muscle tone and strength WNL, no joint swelling, effusions, discoloration, crepitus  SKIN: clear, good turgor, color WNL, no rashes, lesions, or ulcerations Neuro: normal mental status, normal strength, sensation, and motion Psych: alert; oriented to person, place and time, normally interactive and not anxious or depressed in appearance.  Assessment and Plan:  Routine general medical examination at a health care facility  Wheezing - Plan: Pulmonary function test: wheezing ? Asthma, check formal PFT's to clearly define.   Cough - Plan: Pulmonary function test  Elevated liver enzymes - Plan: Hepatitis B core antibody, IgM, Hepatitis B surface antibody, Hepatitis B surface antigen, Hepatitis C antibody: unclear source, check Hep panel, if ok then check u/s  Special screening for malignant neoplasms, colon - Plan: Ambulatory referral to Gastroenterology  The patient's preventative maintenance and recommended screening tests for an annual wellness exam were reviewed in full today. Brought up to date unless services declined.  Counselled on the importance of diet, exercise, and its role in overall health and mortality. The patient's FH and SH was reviewed, including their home life, tobacco status, and drug and alcohol status.   Results for orders placed in visit on 04/17/13  HEPATITIS B CORE ANTIBODY, IGM      Result Value Range   Hep B C IgM NEG  NEGATIVE  HEPATITIS B SURFACE ANTIBODY      Result Value Range   Hep B S Ab POS (*) NEGATIVE  HEPATITIS B SURFACE ANTIGEN      Result Value Range   Hepatitis B Surface Ag NEGATIVE  NEGATIVE  HEPATITIS C ANTIBODY      Result Value Range   HCV Ab NEGATIVE  NEGATIVE     Orders Today:  Orders Placed This  Encounter  Procedures  . Hepatitis B core antibody, IgM  . Hepatitis B surface antibody  . Hepatitis B  surface antigen  . Hepatitis C antibody  . Ambulatory referral to Gastroenterology    Referral Priority:  Routine    Referral Type:  Consultation    Referral Reason:  Specialty Services Required    Requested Specialty:  Gastroenterology    Number of Visits Requested:  1  . Pulmonary function test    Standing Status: Future     Number of Occurrences:      Standing Expiration Date: 04/17/2014    Order Specific Question:  Where should this test be performed?    Answer:  [facility]    Order Specific Question:  Full PFT    Answer:  Yes    Order Specific Question:  Basic spirometry    Answer:  No    Order Specific Question:  Spirometry pre & post bronchodilator    Answer:  Yes    Order Specific Question:  ABG    Answer:  No    Order Specific Question:  Diffusion capacity (DLCO)    Answer:  No    Order Specific Question:  MIP/MEP    Answer:  No    Order Specific Question:  Lung volumes    Answer:  No    Order Specific Question:  Methacholine challenge    Answer:  No    Order Specific Question:  Exercise induced bronchospasm study (EIB)    Answer:  No    Order Specific Question:  Portable before and after    Answer:  No    Order Specific Question:  Portable simple    Answer:  No    Updated Medication List: (Includes new medications, updates to list, dose adjustments) No orders of the defined types were placed in this encounter.    Medications Discontinued: Medications Discontinued During This Encounter  Medication Reason  . varenicline (CHANTIX STARTING MONTH PAK) 0.5 MG X 11 & 1 MG X 42 tablet Completed Course  . varenicline (CHANTIX) 1 MG tablet Completed Course      Signed,  Karleen Hampshire T. Emin Foree, MD

## 2013-04-18 LAB — HEPATITIS B SURFACE ANTIBODY,QUALITATIVE: Hep B S Ab: POSITIVE — AB

## 2013-04-18 LAB — HEPATITIS B CORE ANTIBODY, IGM: Hep B C IgM: NEGATIVE

## 2013-04-18 LAB — HEPATITIS C ANTIBODY: HCV Ab: NEGATIVE

## 2013-04-20 ENCOUNTER — Ambulatory Visit (HOSPITAL_COMMUNITY)
Admission: RE | Admit: 2013-04-20 | Discharge: 2013-04-20 | Disposition: A | Discharge status: Home / Self Care | Payer: 59 | Source: Ambulatory Visit | Attending: Family Medicine | Admitting: Family Medicine

## 2013-04-20 DIAGNOSIS — R0989 Other specified symptoms and signs involving the circulatory and respiratory systems: Secondary | ICD-10-CM | POA: Insufficient documentation

## 2013-04-20 DIAGNOSIS — R0609 Other forms of dyspnea: Secondary | ICD-10-CM | POA: Insufficient documentation

## 2013-04-20 DIAGNOSIS — R05 Cough: Secondary | ICD-10-CM

## 2013-04-20 DIAGNOSIS — R062 Wheezing: Secondary | ICD-10-CM | POA: Insufficient documentation

## 2013-04-20 DIAGNOSIS — R059 Cough, unspecified: Secondary | ICD-10-CM | POA: Insufficient documentation

## 2013-04-20 LAB — PULMONARY FUNCTION TEST

## 2013-04-20 MED ORDER — ALBUTEROL SULFATE (5 MG/ML) 0.5% IN NEBU
2.5000 mg | INHALATION_SOLUTION | Freq: Once | RESPIRATORY_TRACT | Status: AC
  Administered 2013-04-20: 2.5 mg via RESPIRATORY_TRACT

## 2013-05-03 ENCOUNTER — Telehealth: Payer: Self-pay

## 2013-05-03 MED ORDER — ALBUTEROL SULFATE HFA 108 (90 BASE) MCG/ACT IN AERS: 2.0000 | INHALATION_SPRAY | Freq: Four times a day (QID) | RESPIRATORY_TRACT | Status: DC | PRN

## 2013-05-03 MED ORDER — FORMOTEROL FUMARATE 12 MCG IN CAPS: 12.0000 ug | ORAL_CAPSULE | Freq: Two times a day (BID) | RESPIRATORY_TRACT | Status: DC

## 2013-05-03 NOTE — Telephone Encounter (Signed)
i spoke with him and going to start foradil bid

## 2013-05-03 NOTE — Telephone Encounter (Signed)
Pt left v/m requesting cb [phone]; pt going out of town 05/04/13 for one week; pt request results of pulmonary function test done 04/20/13 (report under media tab). Pt still has persistent cough and using albuterol inhaler;Please advise.

## 2013-05-03 NOTE — Telephone Encounter (Signed)
Please call Austin Houston  PFT's showed a mild obstructive pattern.  He is using a lot of inhaler (albuterol) --- using a schedule long-acting b--agonist would be a good idea to decrease his need for albuterol. I think it would help him breath and feel better.

## 2013-05-03 NOTE — Telephone Encounter (Signed)
Left message for patient to return my call.

## 2013-06-28 ENCOUNTER — Other Ambulatory Visit: Payer: Self-pay | Admitting: Vascular Surgery

## 2013-06-28 DIAGNOSIS — M79609 Pain in unspecified limb: Secondary | ICD-10-CM

## 2013-06-28 DIAGNOSIS — I83893 Varicose veins of bilateral lower extremities with other complications: Secondary | ICD-10-CM

## 2013-08-17 ENCOUNTER — Encounter: Payer: Self-pay | Admitting: Family Medicine

## 2013-08-17 ENCOUNTER — Encounter: Payer: Self-pay | Admitting: Vascular Surgery

## 2013-08-18 MED ORDER — OSELTAMIVIR PHOSPHATE 75 MG PO CAPS: 75.0000 mg | ORAL_CAPSULE | Freq: Two times a day (BID) | ORAL | Status: DC

## 2013-09-05 ENCOUNTER — Ambulatory Visit: Payer: 59 | Admitting: Vascular Surgery

## 2013-09-05 ENCOUNTER — Encounter (HOSPITAL_COMMUNITY): Payer: 59

## 2013-09-06 ENCOUNTER — Other Ambulatory Visit: Payer: Self-pay | Admitting: Family Medicine

## 2013-09-08 ENCOUNTER — Encounter: Payer: Self-pay | Admitting: Internal Medicine

## 2013-09-08 ENCOUNTER — Ambulatory Visit (INDEPENDENT_AMBULATORY_CARE_PROVIDER_SITE_OTHER): Payer: 59 | Admitting: Internal Medicine

## 2013-09-08 VITALS — BP 122/86 | HR 67 | Temp 98.0°F | Wt 229.5 lb

## 2013-09-08 DIAGNOSIS — R059 Cough, unspecified: Secondary | ICD-10-CM

## 2013-09-08 DIAGNOSIS — R05 Cough: Secondary | ICD-10-CM

## 2013-09-08 DIAGNOSIS — J441 Chronic obstructive pulmonary disease with (acute) exacerbation: Secondary | ICD-10-CM

## 2013-09-08 MED ORDER — PREDNISONE 10 MG PO TABS: ORAL_TABLET | ORAL | Status: DC

## 2013-09-08 NOTE — Progress Notes (Signed)
Pre visit review using our clinic review tool, if applicable. No additional management support is needed unless otherwise documented below in the visit note. 

## 2013-09-08 NOTE — Patient Instructions (Addendum)

## 2013-09-08 NOTE — Progress Notes (Signed)
HPI  Pt presents to the clinic today with c/o cough. This started about 3 weeks ago. He had flu like symptoms and was prescribed Tamiflu. The cough is worse at night. It is productive of thick green sputum. He c/o coughing fits. He has been using his inhalers. He does have a history of allergies and COPD. He denies fever, chills or body aches. He has had PFT's which only showed mild decrease in lung function.  Review of Systems      Past Medical History  Diagnosis Date  . Follicular cancer of thyroid     s/p partial thyroidectomy (follicular adenoma)  . GERD (gastroesophageal reflux disease)   . Allergic rhinitis due to pollen   . Hypertension   . Hyperlipidemia   . PTSD (post-traumatic stress disorder)     s/p multiple active deployments for [employer]    Family History  Problem Relation Age of Onset  . Heart disease Paternal Grandmother   . Prostate cancer Father     Recurrent x 3  . Hyperlipidemia Father   . Other Father     varicose veins  . Hyperlipidemia Mother   . Other Mother     varicose veins  . Hypertension Maternal Grandmother   . Hypertension Maternal Grandfather   . Sudden death Brother 44    History   Social History  . Marital Status: Married    Spouse Name: N/A    Number of Children: N/A  . Years of Education: N/A   Occupational History  . 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 Smoking status: Never Smoker   . Smokeless tobacco: Former Systems developer    Types: Snuff    Quit date: 12/03/2012  . Alcohol Use: 6.0 oz/week    10 Cans of beer per week     Comment: moderate  . Drug Use: No  . Sexual Activity: Yes    Partners: Female   Other Topics Concern  . 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 Vioxx [Rofecoxib] Anaphylaxis  . Celebrex [Celecoxib] Rash  . Codeine Rash  . Doxycycline Rash     Constitutional: Denies headache, fatigue, fever or abrupt weight changes.  HEENT:  Denies eye redness, eye pain, pressure behind the eyes, facial pain, nasal congestion, ear pain, ringing in the ears, wax buildup, runny nose or bloody nose. Respiratory: Positive cough and wheezing. Denies difficulty breathing or shortness of breath.  Cardiovascular: Denies chest pain, chest tightness, palpitations or swelling in the hands or feet.   No other specific complaints in a complete review of systems (except as listed in HPI above).  Objective:   BP 122/86  Pulse 67  Temp(Src) 98 F (36.7 C) (Oral)  Wt 229 lb 8 oz (104.101 kg)  SpO2 97% Wt Readings from Last 3 Encounters:  09/08/13 229 lb 8 oz (104.101 kg)  04/17/13 222 lb 8 oz (100.925 kg)  03/01/13 227 lb 4.8 oz (103.103 kg)     General: Appears his stated age, well developed, well nourished in NAD. HEENT: Head: normal shape and size; Eyes: sclera white, no icterus, conjunctiva pink,  PERRLA and EOMs intact; Ears: Tm's gray and intact, normal light reflex; Nose: mucosa pink and moist, septum midline; Throat/Mouth: + PND. Teeth present, mucosa erythematous and moist, no exudate noted, no lesions or ulcerations noted.  Neck: Mild cervical lymphadenopathy. Neck supple, trachea midline. No massses, lumps or thyromegaly present.  Cardiovascular: Normal rate and rhythm. S1,S2 noted.  No murmur, rubs or gallops noted. No JVD or BLE edema. No carotid bruits noted. Pulmonary/Chest: Normal effort and bilateral inspiratory or expiratory wheeze. No respiratory distress. No  rales or ronchi noted.      Assessment & Plan:   Copd exacerbation:  Continue inhalers as prescribed eRx for pred taper x 6 days No need for abx at this time- I do not think this is infectious  RTC as needed or if symptoms  persist.

## 2013-09-11 ENCOUNTER — Telehealth: Payer: Self-pay | Admitting: Family Medicine

## 2013-09-11 NOTE — Telephone Encounter (Signed)
Relevant patient education assigned to patient using Emmi. ° °

## 2013-09-26 ENCOUNTER — Encounter: Payer: Self-pay | Admitting: Internal Medicine

## 2013-12-07 ENCOUNTER — Encounter: Payer: 59 | Admitting: Internal Medicine

## 2014-01-10 ENCOUNTER — Ambulatory Visit (AMBULATORY_SURGERY_CENTER): Payer: Self-pay | Admitting: *Deleted

## 2014-01-10 VITALS — Ht 74.0 in | Wt 229.6 lb

## 2014-01-10 DIAGNOSIS — Z1211 Encounter for screening for malignant neoplasm of colon: Secondary | ICD-10-CM

## 2014-01-10 MED ORDER — MOVIPREP 100 G PO SOLR: ORAL | Status: DC

## 2014-01-10 NOTE — Progress Notes (Signed)
No egg or soy allergy  Pt states when he has had general anesthesia twice before, he has become very hypertensive  No trouble with intubation  No diet medications taken  Registered in [facility]

## 2014-01-29 ENCOUNTER — Ambulatory Visit (AMBULATORY_SURGERY_CENTER): Payer: 59 | Admitting: Internal Medicine

## 2014-01-29 ENCOUNTER — Encounter: Payer: Self-pay | Admitting: Internal Medicine

## 2014-01-29 VITALS — BP 118/76 | HR 62 | Temp 97.7°F | Resp 17 | Ht 74.0 in | Wt 229.0 lb

## 2014-01-29 DIAGNOSIS — D126 Benign neoplasm of colon, unspecified: Secondary | ICD-10-CM

## 2014-01-29 DIAGNOSIS — Z1211 Encounter for screening for malignant neoplasm of colon: Secondary | ICD-10-CM

## 2014-01-29 MED ORDER — SODIUM CHLORIDE 0.9 % IV SOLN: 500.0000 mL | INTRAVENOUS | Status: DC

## 2014-01-29 NOTE — Progress Notes (Signed)
Procedure ends, to recovery, report given and VSS. 

## 2014-01-29 NOTE — Progress Notes (Signed)
Called to room to assist during endoscopic procedure.  Patient ID and intended procedure confirmed with present staff. Received instructions for my participation in the procedure from the performing physician.  

## 2014-01-29 NOTE — Patient Instructions (Signed)
YOU HAD AN ENDOSCOPIC PROCEDURE TODAY AT THE  ENDOSCOPY CENTER: Refer to the procedure report that was given to you for any specific questions about what was found during the examination.  If the procedure report does not answer your questions, please call your gastroenterologist to clarify.  If you requested that your care partner not be given the details of your procedure findings, then the procedure report has been included in a sealed envelope for you to review at your convenience later.  YOU SHOULD EXPECT: Some feelings of bloating in the abdomen. Passage of more gas than usual.  Walking can help get rid of the air that was put into your GI tract during the procedure and reduce the bloating. If you had a lower endoscopy (such as a colonoscopy or flexible sigmoidoscopy) you may notice spotting of blood in your stool or on the toilet paper. If you underwent a bowel prep for your procedure, then you may not have a normal bowel movement for a few days.  DIET: Your first meal following the procedure should be a light meal and then it is ok to progress to your normal diet.  A half-sandwich or bowl of soup is an example of a good first meal.  Heavy or fried foods are harder to digest and may make you feel nauseous or bloated.  Likewise meals heavy in dairy and vegetables can cause extra gas to form and this can also increase the bloating.  Drink plenty of fluids but you should avoid alcoholic beverages for 24 hours.  ACTIVITY: Your care partner should take you home directly after the procedure.  You should plan to take it easy, moving slowly for the rest of the day.  You can resume normal activity the day after the procedure however you should NOT DRIVE or use heavy machinery for 24 hours (because of the sedation medicines used during the test).    SYMPTOMS TO REPORT IMMEDIATELY: A gastroenterologist can be reached at any hour.  During normal business hours, 8:30 AM to 5:00 PM Monday through Friday,  call [phone].  After hours and on weekends, please call the GI answering service at [phone] who will take a message and have the physician on call contact you.   Following lower endoscopy (colonoscopy or flexible sigmoidoscopy):  Excessive amounts of blood in the stool  Significant tenderness or worsening of abdominal pains  Swelling of the abdomen that is new, acute  Fever of 100F or higher   FOLLOW UP: If any biopsies were taken you will be contacted by phone or by letter within the next 1-3 weeks.  Call your gastroenterologist if you have not heard about the biopsies in 3 weeks.  Our staff will call the home number listed on your records the next business day following your procedure to check on you and address any questions or concerns that you may have at that time regarding the information given to you following your procedure. This is a courtesy call and so if there is no answer at the home number and we have not heard from you through the emergency physician on call, we will assume that you have returned to your regular daily activities without incident.  SIGNATURES/CONFIDENTIALITY: You and/or your care partner have signed paperwork which will be entered into your electronic medical record.  These signatures attest to the fact that that the information above on your After Visit Summary has been reviewed and is understood.  Full responsibility of the confidentiality of   this discharge information lies with you and/or your care-partner.   Resume medications. Information given on polyps,diverticulosis and high fiber diet with discharge instructions. 

## 2014-01-29 NOTE — Op Note (Signed)
[facility]  [address] [place], [id number]   COLONOSCOPY PROCEDURE REPORT  PATIENT: Austin Houston, Austin Houston  MR#: [id number] BIRTHDATE: Aug 02, 1960 , 52  yrs. old GENDER: Male ENDOSCOPIST: Jerene Bears, MD REFERRED EB:RAXENMM Celedonio Savage, M.D. PROCEDURE DATE:  01/29/2014 PROCEDURE:   Colonoscopy with snare polypectomy First Screening Colonoscopy - Avg.  risk and is 50 yrs.  old or older Yes.  Prior Negative Screening - Now for repeat screening. N/A  History of Adenoma - Now for follow-up colonoscopy & has been > or = to 3 yrs.  N/A  Polyps Removed Today? Yes. ASA CLASS:   Class III INDICATIONS:average risk screening and first colonoscopy. MEDICATIONS: MAC sedation, administered by CRNA and propofol (Diprivan) 400mg  IV  DESCRIPTION OF PROCEDURE:   After the risks benefits and alternatives of the procedure were thoroughly explained, informed consent was obtained.  A digital rectal exam revealed no rectal mass.   The LB [id number]  endoscope was introduced through the anus and advanced to the cecum, which was identified by both the appendix and ileocecal valve. No adverse events experienced. The quality of the prep was good, using MoviPrep  The instrument was then slowly withdrawn as the colon was fully examined.  COLON FINDINGS: A sessile polyp measuring 5 mm in size was found in the ascending colon.  A polypectomy was performed with a cold snare.  The resection was complete and the polyp tissue was completely retrieved.   There was mild diverticulosis noted at the splenic flexure, in the descending colon, and sigmoid colon with associated muscular hypertrophy.  Retroflexed views revealed no abnormalities. The time to cecum=2 minutes 39 seconds.  Withdrawal time=13 minutes 50 seconds.  The scope was withdrawn and the procedure completed. COMPLICATIONS: There were no complications.  ENDOSCOPIC IMPRESSION: 1.   Sessile polyp measuring 5 mm in size was found in  the ascending colon; polypectomy was performed with a cold snare 2.   There was mild diverticulosis noted at the splenic flexure, in the descending colon, and sigmoid colon  RECOMMENDATIONS: 1.  Await pathology results 2.  High fiber diet 3.  Timing of repeat colonoscopy will be determined by pathology findings, 5 years if polyp is adenomatous. 4.  You will receive a letter within 1-2 weeks with the results of your biopsy as well as final recommendations.  Please call my office if you have not received a letter after 3 weeks.   eSigned:  Jerene Bears, MD 01/29/2014 9:05 AM  cc: The Patient and Kathryne Eriksson, MD

## 2014-01-30 ENCOUNTER — Telehealth: Payer: Self-pay

## 2014-01-30 NOTE — Telephone Encounter (Signed)
Left a message at [phone]  For the pt to call us back if any questions or concerns. Maw

## 2014-02-01 ENCOUNTER — Encounter: Payer: Self-pay | Admitting: Internal Medicine

## 2014-03-05 ENCOUNTER — Other Ambulatory Visit: Payer: Self-pay | Admitting: *Deleted

## 2014-03-05 MED ORDER — LOSARTAN POTASSIUM 100 MG PO TABS
ORAL_TABLET | ORAL | Status: DC
Start: 2014-03-05 — End: 2014-06-18

## 2014-03-05 MED ORDER — OMEPRAZOLE 40 MG PO CPDR: 40.0000 mg | DELAYED_RELEASE_CAPSULE | Freq: Every day | ORAL | Status: DC

## 2014-03-05 MED ORDER — ATORVASTATIN CALCIUM 40 MG PO TABS: ORAL_TABLET | ORAL | Status: DC

## 2014-03-05 MED ORDER — AMLODIPINE BESYLATE 10 MG PO TABS
ORAL_TABLET | ORAL | Status: DC
Start: 2014-03-05 — End: 2014-06-18

## 2014-03-05 NOTE — Telephone Encounter (Signed)
Received faxed refill request from pharmacy. Refills sent to pharmacy electronically. 

## 2014-03-29 ENCOUNTER — Emergency Department (INDEPENDENT_AMBULATORY_CARE_PROVIDER_SITE_OTHER): Payer: 59

## 2014-03-29 ENCOUNTER — Emergency Department (INDEPENDENT_AMBULATORY_CARE_PROVIDER_SITE_OTHER)
Admission: EM | Admit: 2014-03-29 | Discharge: 2014-03-29 | Disposition: A | Discharge status: Home / Self Care | Payer: Self-pay | Source: Home / Self Care | Attending: Family Medicine | Admitting: Family Medicine

## 2014-03-29 ENCOUNTER — Encounter (HOSPITAL_COMMUNITY): Payer: Self-pay | Admitting: Emergency Medicine

## 2014-03-29 DIAGNOSIS — M542 Cervicalgia: Secondary | ICD-10-CM

## 2014-03-29 DIAGNOSIS — M545 Low back pain, unspecified: Secondary | ICD-10-CM

## 2014-03-29 DIAGNOSIS — M25562 Pain in left knee: Secondary | ICD-10-CM

## 2014-03-29 DIAGNOSIS — M25569 Pain in unspecified knee: Secondary | ICD-10-CM
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
## 2014-03-29 NOTE — Discharge Instructions (Signed)
Thank you for coming in today. Continue ibuprofen as needed. Use a heating pad for the neck and back Followup with Dr. Edilia Bo for left knee pain if it continues for more than one or 2 weeks. Come back or go to the emergency room if you notice new weakness new numbness problems walking or bowel or bladder problems.   Meniscus Tear with Phase I Rehab The meniscus is a C-shaped cartilage structure, located in the knee joint between the thigh bone (femur) and the shinbone (tibia). Two menisci are located in each knee joint: the inner and outer meniscus. The meniscus acts as an adapter between the thigh bone and shinbone, allowing them to fit properly together. It also functions as a shock absorber, to reduce the stress placed on the knee joint and to help supply nutrients to the knee joint cartilage. As people age, the meniscus begins to harden and become more vulnerable to injury. Meniscus tears are a common injury, especially in older athletes. Inner meniscus tears are more common than outer meniscus tears.  SYMPTOMS   Pain in the knee, especially with standing or squatting with the affected leg.  Tenderness along the joint line.  Swelling in the knee joint (effusion), usually starting 1 to 2 days after injury.  Locking or catching of the knee joint, causing inability to straighten the knee completely.  Giving way or buckling of the knee. CAUSES  A meniscus tear occurs when a force is placed on the meniscus that is greater than it can handle. Common causes of injury include:  Direct hit (trauma) to the knee.  Twisting, pivoting, or cutting (rapidly changing direction while running), kneeling or squatting.  Without injury, due to aging. RISK INCREASES WITH:  Contact sports (football, rugby).  Sports in which cleats are used with pivoting (soccer, lacrosse) or sports in which good shoe grip and sudden change in direction are required (racquetball, basketball, squash).  Previous knee  injury.  Associated knee injury, particularly ligament injuries.  Poor strength and flexibility. PREVENTION  Warm up and stretch properly before activity.  Maintain physical fitness:  Strength, flexibility, and endurance.  Cardiovascular fitness.  Protect the knee with a brace or elastic bandage.  Wear properly fitted protective equipment (proper cleats for the surface). PROGNOSIS  Sometimes, meniscus tears heal on their own. However, definitive treatment requires surgery, followed by at least 6 weeks of recovery.  RELATED COMPLICATIONS   Recurring symptoms that result in a chronic problem.  Repeated knee injury, especially if sports are resumed too soon after injury or surgery.  Progression of the tear (the tear gets larger), if untreated.  Arthritis of the knee in later years (with or without surgery).  Complications of surgery, including infection, bleeding, injury to nerves (numbness, weakness, paralysis) continued pain, giving way, locking, nonhealing of meniscus (if repaired), need for further surgery, and knee stiffness (loss of motion). TREATMENT  Treatment first involves the use of ice and medicine, to reduce pain and inflammation. You may find using crutches to walk more comfortable. However, it is okay to bear weight on the injured knee, if the pain will allow it. Surgery is often advised as a definitive treatment. Surgery is performed through an incision near the joint (arthroscopically). The torn piece of the meniscus is removed, and if possible the joint cartilage is repaired. After surgery, the joint must be restrained. After restraint, it is important to perform strengthening and stretching exercises to help regain strength and a full range of motion. These exercises may  be completed at home or with a therapist.  MEDICATION  If pain medicine is needed, nonsteroidal anti-inflammatory medicines (aspirin and ibuprofen), or other minor pain relievers (acetaminophen),  are often advised.  Do not take pain medicine for 7 days before surgery.  Prescription pain relievers may be given, if your caregiver thinks they are needed. Use only as directed and only as much as you need. HEAT AND COLD  Cold treatment (icing) should be applied for 10 to 15 minutes every 2 to 3 hours for inflammation and pain, and immediately after activity that aggravates your symptoms. Use ice packs or an ice massage.  Heat treatment may be used before performing stretching and strengthening activities prescribed by your caregiver, physical therapist, or athletic trainer. Use a heat pack or a warm water soak. SEEK MEDICAL CARE IF:   Symptoms get worse or do not improve in 2 weeks, despite treatment.  New, unexplained symptoms develop. (Drugs used in treatment may produce side effects.) EXERCISES RANGE OF MOTION (ROM) AND STRETCHING EXERCISES - Meniscus Tear, Non-operative, Phase I These are some of the initial exercises with which you may start your rehabilitation program, until you see your caregiver again or until your symptoms are resolved. Remember:   These initial exercises are intended to be gentle. They will help you restore motion without increasing any swelling.  Completing these exercises allows less painful movement and prepares you for the more aggressive strengthening exercises in Phase II.  An effective stretch should be held for at least 30 seconds.  A stretch should never be painful. You should only feel a gentle lengthening or release in the stretched tissue. RANGE OF MOTION - Knee Flexion, Active  Lie on your back with both knees straight. (If this causes back discomfort, bend your healthy knee, placing your foot flat on the floor.)  Slowly slide your heel back toward your buttocks until you feel a gentle stretch in the front of your knee or thigh.  Hold for __________ seconds. Slowly slide your heel back to the starting position. Repeat __________ times.  Complete this exercise __________ times per day.  RANGE OF MOTION - Knee Flexion and Extension, Active-Assisted  Sit on the edge of a table or chair with your thighs firmly supported. It may be helpful to place a folded towel under the end of your right / left thigh.  Flexion (bending): Place the ankle of your healthy leg on top of the other ankle. Use your healthy leg to gently bend your right / left knee until you feel a mild tension across the top of your knee.  Hold for __________ seconds.  Extension (straightening): Switch your ankles so your right / left leg is on top. Use your healthy leg to straighten your right / left knee until you feel a mild tension on the backside of your knee.  Hold for __________ seconds. Repeat __________ times. Complete __________ times per day. STRETCH - Knee Flexion, Supine  Lie on the floor with your right / left heel and foot lightly touching the wall. (Place both feet on the wall if you do not use a door frame.)  Without using any effort, allow gravity to slide your foot down the wall slowly until you feel a gentle stretch in the front of your right / left knee.  Hold this stretch for __________ seconds. Then return the leg to the starting position, using your healthy leg for help, if needed. Repeat __________ times. Complete this stretch __________ times per day.  STRETCH - Knee Extension Sitting  Sit with your right / left leg/heel propped on another chair, coffee table, or foot stool.  Allow your leg muscles to relax, letting gravity straighten out your knee.*  You should feel a stretch behind your right / left knee. Hold this position for __________ seconds. Repeat __________ times. Complete this stretch __________ times per day.  *Your physician, physical therapist or athletic trainer may instruct you place a __________ weight on your thigh, just above your kneecap, to deepen the stretch.  STRENGTHENING EXERCISES - Meniscus Tear,  Non-operative, Phase I These exercises may help you when beginning to rehabilitate your injury. They may resolve your symptoms with or without further involvement from your physician, physical therapist or athletic trainer. While completing these exercises, remember:   Muscles can gain both the endurance and the strength needed for everyday activities through controlled exercises.  Complete these exercises as instructed by your physician, physical therapist or athletic trainer. Progress the resistance and repetitions only as guided. STRENGTH - Quadriceps, Isometrics  Lie on your back with your right / left leg extended and your opposite knee bent.  Gradually tense the muscles in the front of your right / left thigh. You should see either your knee cap slide up toward your hip or increased dimpling just above the knee. This motion will push the back of the knee down toward the floor, mat, or bed on which you are lying.  Hold the muscle as tight as you can, without increasing your pain, for __________ seconds.  Relax the muscles slowly and completely between each repetition. Repeat __________ times. Complete this exercise __________ times per day.  STRENGTH - Quadriceps, Short Arcs   Lie on your back. Place a __________ inch towel roll under your right / left knee, so that the knee bends slightly.  Raise only your lower leg by tightening the muscles in the front of your thigh. Do not allow your thigh to rise.  Hold this position for __________ seconds. Repeat __________ times. Complete this exercise __________ times per day.  OPTIONAL ANKLE WEIGHTS: Begin with ____________________, but DO NOT exceed ____________________. Increase in 1 pound/0.5 kilogram increments. STRENGTH - Quadriceps, Straight Leg Raises  Quality counts! Watch for signs that the quadriceps muscle is working, to be sure you are strengthening the correct muscles and not "cheating" by substituting with healthier  muscles.  Lay on your back with your right / left leg extended and your opposite knee bent.  Tense the muscles in the front of your right / left thigh. You should see either your knee cap slide up or increased dimpling just above the knee. Your thigh may even shake a bit.  Tighten these muscles even more and raise your leg 4 to 6 inches off the floor. Hold for __________ seconds.  Keeping these muscles tense, lower your leg.  Relax the muscles slowly and completely in between each repetition. Repeat __________ times. Complete this exercise __________ times per day.  STRENGTH - Hamstring, Curls   Lay on your stomach with your legs extended. (If you lay on a bed, your feet may hang over the edge.)  Tighten the muscles in the back of your thigh to bend your right / left knee up to 90 degrees. Keep your hips flat on the bed.  Hold this position for __________ seconds.  Slowly lower your leg back to the starting position. Repeat __________ times. Complete this exercise __________ times per day.  STRENGTH - Quadriceps, Squats  Stand in a door frame so that your feet and knees are in line with the frame.  Use your hands for balance, not support, on the frame.  Slowly lower your weight, bending at the hips and knees. Keep your lower legs upright so that they are parallel with the door frame. Squat only within the range that does not increase your knee pain. Never let your hips drop below your knees.  Slowly return upright, pushing with your legs, not pulling with your hands. Repeat __________ times. Complete this exercise __________ times per day.  STRENGTH - Quad/VMO, Isometric   Sit in a chair with your right / left knee slightly bent. With your fingertips, feel the VMO muscle just above the inside of your knee. The VMO is important in controlling the position of your kneecap.  Keeping your fingertips on this muscle. Without actually moving your leg, attempt to drive your knee down as  if straightening your leg. You should feel your VMO tense. If you have a difficult time, you may wish to try the same exercise on your healthy knee first.  Tense this muscle as hard as you can without increasing any knee pain.  Hold for __________ seconds. Relax the muscles slowly and completely in between each repetition. Repeat __________ times. Complete exercise __________ times per day.  Document Released: 07/13/1998 Document Revised: 09/21/2011 Document Reviewed: 10/11/2008 [facility] Patient Information 2015 [place], [place]. This information is not intended to replace advice given to you by your health care provider. Make sure you discuss any questions you have with your health care provider.

## 2014-06-15 ENCOUNTER — Other Ambulatory Visit (INDEPENDENT_AMBULATORY_CARE_PROVIDER_SITE_OTHER): Payer: 59

## 2014-06-15 DIAGNOSIS — E785 Hyperlipidemia, unspecified: Secondary | ICD-10-CM

## 2014-06-15 DIAGNOSIS — Z125 Encounter for screening for malignant neoplasm of prostate: Secondary | ICD-10-CM

## 2014-06-15 DIAGNOSIS — I10 Essential (primary) hypertension: Secondary | ICD-10-CM

## 2014-06-17 LAB — TSH: TSH: 3.01 u[IU]/mL (ref 0.35–4.50)

## 2014-06-17 LAB — CBC WITH DIFFERENTIAL/PLATELET
BASOS PCT: 0.6 % (ref 0.0–3.0)
Basophils Absolute: 0 10*3/uL (ref 0.0–0.1)
EOS ABS: 0.2 10*3/uL (ref 0.0–0.7)
EOS PCT: 4.1 % (ref 0.0–5.0)
HCT: 43.4 % (ref 39.0–52.0)
HEMOGLOBIN: 15 g/dL (ref 13.0–17.0)
LYMPHS PCT: 24.8 % (ref 12.0–46.0)
Lymphs Abs: 1.2 10*3/uL (ref 0.7–4.0)
MCHC: 34.6 g/dL (ref 30.0–36.0)
MCV: 91.5 fl (ref 78.0–100.0)
MONO ABS: 0.3 10*3/uL (ref 0.1–1.0)
Monocytes Relative: 6.7 % (ref 3.0–12.0)
NEUTROS ABS: 3 10*3/uL (ref 1.4–7.7)
Neutrophils Relative %: 63.8 % (ref 43.0–77.0)
Platelets: 277 10*3/uL (ref 150.0–400.0)
RBC: 4.74 Mil/uL (ref 4.22–5.81)
RDW: 12.5 % (ref 11.5–15.5)
WBC: 4.8 10*3/uL (ref 4.0–10.5)

## 2014-06-17 LAB — COMPREHENSIVE METABOLIC PANEL
ALK PHOS: 55 U/L (ref 39–117)
ALT: 96 U/L — AB (ref 0–53)
AST: 64 U/L — ABNORMAL HIGH (ref 0–37)
Albumin: 4.5 g/dL (ref 3.5–5.2)
BILIRUBIN TOTAL: 1 mg/dL (ref 0.2–1.2)
BUN: 15 mg/dL (ref 6–23)
CO2: 24 mEq/L (ref 19–32)
Calcium: 9.3 mg/dL (ref 8.4–10.5)
Chloride: 104 mEq/L (ref 96–112)
Creatinine, Ser: 1.2 mg/dL (ref 0.4–1.5)
GFR: 69.6 mL/min (ref >60.00)
Glucose, Bld: 93 mg/dL (ref 70–99)
Potassium: 4.5 mEq/L (ref 3.5–5.1)
SODIUM: 138 meq/L (ref 135–145)
Total Protein: 7.2 g/dL (ref 6.0–8.3)

## 2014-06-17 LAB — PSA: PSA: 3.47 ng/mL (ref 0.10–4.00)

## 2014-06-17 LAB — LIPID PANEL
CHOL/HDL RATIO: 3
Cholesterol: 214 mg/dL — ABNORMAL HIGH (ref 0–200)
HDL: 76.2 mg/dL (ref >39.00)
LDL CALC: 119 mg/dL — AB (ref 0–99)
NONHDL: 137.8
Triglycerides: 93 mg/dL (ref 0.0–149.0)
VLDL: 18.6 mg/dL (ref 0.0–40.0)

## 2014-06-18 ENCOUNTER — Other Ambulatory Visit: Payer: Self-pay | Admitting: Family Medicine

## 2014-06-18 ENCOUNTER — Other Ambulatory Visit: Payer: 59

## 2014-06-18 ENCOUNTER — Ambulatory Visit (INDEPENDENT_AMBULATORY_CARE_PROVIDER_SITE_OTHER): Payer: 59 | Admitting: Family Medicine

## 2014-06-18 ENCOUNTER — Encounter: Payer: Self-pay | Admitting: Family Medicine

## 2014-06-18 VITALS — BP 118/84 | HR 83 | Temp 98.2°F | Ht 72.5 in | Wt 227.5 lb

## 2014-06-18 DIAGNOSIS — E785 Hyperlipidemia, unspecified: Secondary | ICD-10-CM

## 2014-06-18 DIAGNOSIS — Z658 Other specified problems related to psychosocial circumstances: Secondary | ICD-10-CM

## 2014-06-18 DIAGNOSIS — Z8042 Family history of malignant neoplasm of prostate: Secondary | ICD-10-CM

## 2014-06-18 DIAGNOSIS — F439 Reaction to severe stress, unspecified: Secondary | ICD-10-CM

## 2014-06-18 DIAGNOSIS — Z0000 Encounter for general adult medical examination without abnormal findings: Secondary | ICD-10-CM

## 2014-06-18 DIAGNOSIS — R945 Abnormal results of liver function studies: Principal | ICD-10-CM

## 2014-06-18 DIAGNOSIS — R7989 Other specified abnormal findings of blood chemistry: Secondary | ICD-10-CM

## 2014-06-18 MED ORDER — ONDANSETRON HCL 4 MG PO TABS: 4.0000 mg | ORAL_TABLET | Freq: Three times a day (TID) | ORAL | Status: DC | PRN

## 2014-06-18 MED ORDER — PROMETHAZINE HCL 25 MG RE SUPP: 25.0000 mg | Freq: Four times a day (QID) | RECTAL | Status: DC | PRN

## 2014-06-18 NOTE — Patient Instructions (Signed)
Stop Lipitor Cut back on alcohol. Recheck liver in a few months.

## 2014-06-18 NOTE — Progress Notes (Signed)
Pre visit review using our clinic review tool, if applicable. No additional management support is needed unless otherwise documented below in the visit note. 

## 2014-06-18 NOTE — Progress Notes (Signed)
Dr. Frederico Hamman T. Sharvi Mooneyhan, MD, [facility] [place], [address] Phone: [phone] Fax: [phone]  06/18/2014  Patient: Austin Houston, MRN: [id number], DOB: March 19, 1961, 53 y.o.  Primary Physician:  Austin Loffler, MD  Chief Complaint: Annual Exam  Subjective:   Austin Houston is a 53 y.o. pleasant patient who presents with the following:  Preventative Health Maintenance Visit and a few acute issues.  Health Maintenance Summary Reviewed and updated, unless pt declines services.  Tobacco History Reviewed. Quit smokeless. Alcohol: 2-3 beers daily Exercise Habits: rare STD concerns: no risk or activity to increase risk Drug Use: None Encouraged self-testicular check  A lot of stress at home: Stress, step father dies in [place]. Doing LVAD, etc.  Step son living with him now. DOG Mother in law in the hospital a few times since august.  -Salmonella sepsis.   Elevated LFT's. Hepatic Function Latest Ref Rng 06/15/2014 04/13/2013  Total Protein 6.0 - 8.3 g/dL 7.2 6.7  Albumin 3.5 - 5.2 g/dL 4.5 4.2  AST 0 - 37 U/L 64(H) 47(H)  ALT 0 - 53 U/L 96(H) 75(H)  Alk Phosphatase 39 - 117 U/L 55 54  Total Bilirubin 0.2 - 1.2 mg/dL 1.0 0.7  Bilirubin, Direct 0.0 - 0.3 mg/dL - 0.1   Hep B and C negative last year.  Stop Lipitor Cut back on alcohol. Recheck liver in a few months.  Flu shot at work.   Lipids: Worsened despite taking meds Diet has been poor.  Limited exercise. Panel reviewed with patient.  Lipids:    Component Value Date/Time   CHOL 214* 06/15/2014 1210   TRIG 93.0 06/15/2014 1210   HDL 76.20 06/15/2014 1210   VLDL 18.6 06/15/2014 1210   CHOLHDL 3 06/15/2014 1210    Lab Results  Component Value Date   ALT 96* 06/15/2014   AST 64* 06/15/2014   ALKPHOS 55 06/15/2014   BILITOT 1.0 06/15/2014     Health Maintenance  Topic Date Due  . INFLUENZA VACCINE  02/11/2015  . TETANUS/TDAP  07/13/2016    . COLONOSCOPY  01/30/2019    There is no immunization history on file for this patient. Patient Active Problem List   Diagnosis Date Noted  . Follicular cancer of thyroid     Priority: High  . Family history of prostate cancer in father 06/19/2014  . Elevated LFTs 06/19/2014  . Special screening for malignant neoplasm of prostate 04/17/2013  . Varicose veins of lower extremities with other complications 25/95/6387  . Former smokeless tobacco use 11/25/2012  . GERD (gastroesophageal reflux disease)   . Allergic rhinitis due to pollen   . Hypertension   . Hyperlipidemia   . PTSD (post-traumatic stress disorder)    Past Medical History  Diagnosis Date  . Follicular cancer of thyroid     s/p partial thyroidectomy (follicular adenoma)  . GERD (gastroesophageal reflux disease)   . Allergic rhinitis due to pollen   . Hypertension   . Hyperlipidemia   . PTSD (post-traumatic stress disorder)     s/p multiple active deployments for [employer], no medications taken currently  . Allergy   . Asthma     vs COPD- taking inhalers  . COPD (chronic obstructive pulmonary disease)     vs asthma   Past Surgical History  Procedure Laterality Date  . Inguinal hernia repair      right  . Thyroidectomy, partial      Austin Houston [facility])  History   Social History  . Marital Status: Married    Spouse Name: N/A    Number of Children: N/A  . Years of Education: N/A   Occupational History  . 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 Smoking status: Never Smoker   . Smokeless tobacco: Former Systems developer    Types: Snuff    Quit date: 12/03/2012  . Alcohol Use: 6.0 oz/week    10 Cans of beer per week     Comment: moderate  . Drug Use: No  . Sexual Activity:    Partners: Female   Other Topics Concern  . 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 Heart disease Paternal Grandmother   . Prostate cancer Father     Recurrent x 3  . Hyperlipidemia Father   . Other Father     varicose veins  . Hyperlipidemia Mother   . Other Mother     varicose veins  . Hypertension Maternal Grandmother   . Hypertension Maternal Grandfather   . Sudden death Brother 8  . Colon cancer Neg Hx   . Esophageal cancer Neg Hx   . Rectal cancer Neg Hx   . Stomach cancer Neg Hx    Allergies  Allergen Reactions  . Vioxx [Rofecoxib] Anaphylaxis  . Celebrex [Celecoxib] Rash  . Codeine Rash  . Doxycycline Rash    Medication list has been reviewed and updated.   General: Denies fever, chills, sweats. No significant weight loss. Eyes: Denies blurring,significant itching ENT: Denies earache, sore throat, and hoarseness. Cardiovascular: Denies chest pains, palpitations, dyspnea on exertion Respiratory: Denies cough, dyspnea at rest,wheeezing Breast: no concerns about lumps GI: Denies nausea, vomiting, diarrhea, constipation, change in bowel habits, abdominal pain, melena, hematochezia GU: Denies penile discharge, ED, urinary flow / outflow problems. No STD concerns. Musculoskeletal: Denies back pain, joint pain Derm: Denies rash, itching Neuro: Denies  paresthesias, frequent falls, frequent headaches Psych: stress as above Endocrine: Denies cold intolerance, heat intolerance, polydipsia Heme: Denies enlarged lymph nodes Allergy: No hayfever  Objective:   BP 118/84 mmHg  Pulse 83  Temp(Src) 98.2 F (36.8 C) (Oral)  Ht 6' 0.5" (1.842 m)  Wt 227 lb 8 oz (103.193 kg)  BMI 30.41 kg/m2  No exam data present  GEN: well developed, well nourished, no acute distress Eyes: conjunctiva and lids normal, PERRLA, EOMI ENT: TM clear, nares clear, oral exam WNL Neck: supple, no lymphadenopathy, no  thyromegaly, no JVD Pulm: clear to auscultation and percussion, respiratory effort normal CV: regular rate and rhythm, S1-S2, no murmur, rub or gallop, no bruits, peripheral pulses normal and symmetric, no cyanosis, clubbing, edema or varicosities Chest: no scars, masses GI: soft, non-tender; no hepatosplenomegaly, masses; active bowel sounds all quadrants GU: no hernia, testicular mass, penile discharge, or prostate enlargement Lymph: no cervical, axillary or inguinal adenopathy MSK: gait normal, muscle tone and strength WNL, no joint swelling, effusions, discoloration, crepitus  SKIN: clear, good turgor, color WNL, no rashes, lesions, or ulcerations Neuro: normal mental status, normal strength, sensation, and motion Psych: alert; oriented to person, place and time, normally  interactive and not anxious or depressed in appearance. All labs reviewed with patient.  Lipids:    Component Value Date/Time   CHOL 214* 06/15/2014 1210   TRIG 93.0 06/15/2014 1210   HDL 76.20 06/15/2014 1210   VLDL 18.6 06/15/2014 1210   CHOLHDL 3 06/15/2014 1210   CBC: CBC Latest Ref Rng 06/15/2014 04/13/2013  WBC 4.0 - 10.5 K/uL 4.8 4.3(L)  Hemoglobin 13.0 - 17.0 g/dL 15.0 14.3  Hematocrit 39.0 - 52.0 % 43.4 41.7  Platelets 150.0 - 400.0 K/uL 277.0 947.6    Basic Metabolic Panel:    Component Value Date/Time   NA 138 06/15/2014 1210   K 4.5 06/15/2014 1210   CL 104 06/15/2014 1210   CO2 24 06/15/2014 1210   BUN 15 06/15/2014 1210   CREATININE 1.2 06/15/2014 1210   GLUCOSE 93 06/15/2014 1210   CALCIUM 9.3 06/15/2014 1210   Hepatic Function Latest Ref Rng 06/15/2014 04/13/2013  Total Protein 6.0 - 8.3 g/dL 7.2 6.7  Albumin 3.5 - 5.2 g/dL 4.5 4.2  AST 0 - 37 U/L 64(H) 47(H)  ALT 0 - 53 U/L 96(H) 75(H)  Alk Phosphatase 39 - 117 U/L 55 54  Total Bilirubin 0.2 - 1.2 mg/dL 1.0 0.7  Bilirubin, Direct 0.0 - 0.3 mg/dL - 0.1    Lab Results  Component Value Date   TSH 3.01 06/15/2014   Lab Results    Component Value Date   PSA 3.47 06/15/2014   PSA 2.50 04/13/2013    Assessment and Plan:   Elevated LFTs: discontinue Lipitor.  Discontinue alcohol. Increase exercise.  Recheck in 3 months.   Healthcare maintenance  Family history of prostate cancer in father  Hyperlipidemia: >15 minutes spent in face to face time with patient, >50% spent in counselling or coordination of care: Spent in discussion regarding hyperlipidemia, management of this, diet, exercise, change in lifestyle, decreased alcohol, and LFT elevation.  Also discussed stress and home situation.  Situational stress  Health Maintenance Exam: The patient's preventative maintenance and recommended screening tests for an annual wellness exam were reviewed in full today. Brought up to date unless services declined.  Counselled on the importance of diet, exercise, and its role in overall health and mortality. The patient's FH and SH was reviewed, including their home life, tobacco status, and drug and alcohol status.  Follow-up: 3 mo  New Prescriptions   ONDANSETRON (ZOFRAN) 4 MG TABLET    Take 1 tablet (4 mg total) by mouth every 8 (eight) hours as needed for nausea or vomiting.   PROMETHAZINE (PHENERGAN) 25 MG SUPPOSITORY    Place 1 suppository (25 mg total) rectally every 6 (six) hours as needed for nausea or vomiting.   No orders of the defined types were placed in this encounter.    Signed,  Maud Deed. , MD   Patient's Medications  New Prescriptions   ONDANSETRON (ZOFRAN) 4 MG TABLET    Take 1 tablet (4 mg total) by mouth every 8 (eight) hours as needed for nausea or vomiting.   PROMETHAZINE (PHENERGAN) 25 MG SUPPOSITORY    Place 1 suppository (25 mg total) rectally every 6 (six) hours as needed for nausea or vomiting.  Previous Medications   AMLODIPINE (NORVASC) 10 MG TABLET    TAKE 1 TABLET BY MOUTH DAILY   ASPIRIN 81 MG TABLET    Take 81 mg by mouth daily.     ATORVASTATIN (LIPITOR) 40 MG TABLET     TAKE 1 TABLET BY MOUTH DAILY  CETIRIZINE (ZYRTEC) 10 MG TABLET    Take 10 mg by mouth daily.   LOSARTAN (COZAAR) 100 MG TABLET    TAKE 1 TABLET BY MOUTH DAILY.   NAPROXEN SODIUM (ANAPROX) 220 MG TABLET    Take 440 mg by mouth 2 (two) times daily with a meal.   OMEPRAZOLE (PRILOSEC) 40 MG CAPSULE    TAKE 1 CAPSULE BY MOUTH DAILY.   VENTOLIN HFA 108 (90 BASE) MCG/ACT INHALER    INHALE 2 PUFFS INTO THE LUNGS EVERY 6 (SIX) HOURS AS NEEDED FOR WHEEZING.  Modified Medications   No medications on file  Discontinued Medications   FORMOTEROL (FORADIL) 12 MCG CAPSULE FOR INHALER    Place 1 capsule (12 mcg total) into inhaler and inhale 2 (two) times daily.   NAPROXEN SODIUM (ANAPROX) 220 MG TABLET    Take 220 mg by mouth daily. Total of 440 mg taken daily   NAPROXEN SODIUM (TH NAPROXEN) 220 MG TABLET    Take 440 mg by mouth 2 (two) times daily with a meal.

## 2014-06-19 DIAGNOSIS — R945 Abnormal results of liver function studies: Principal | ICD-10-CM

## 2014-06-19 DIAGNOSIS — Z8042 Family history of malignant neoplasm of prostate: Secondary | ICD-10-CM | POA: Insufficient documentation

## 2014-06-19 DIAGNOSIS — R7989 Other specified abnormal findings of blood chemistry: Secondary | ICD-10-CM | POA: Insufficient documentation

## 2015-05-28 ENCOUNTER — Encounter: Payer: Self-pay | Admitting: Physician Assistant

## 2015-05-28 ENCOUNTER — Ambulatory Visit: Payer: Self-pay | Admitting: Physician Assistant

## 2015-05-28 VITALS — BP 138/100 | HR 80 | Temp 98.5°F

## 2015-05-28 DIAGNOSIS — J069 Acute upper respiratory infection, unspecified: Secondary | ICD-10-CM

## 2015-05-28 DIAGNOSIS — J4520 Mild intermittent asthma, uncomplicated: Secondary | ICD-10-CM

## 2015-05-28 DIAGNOSIS — R05 Cough: Secondary | ICD-10-CM

## 2015-05-28 DIAGNOSIS — R059 Cough, unspecified: Secondary | ICD-10-CM

## 2015-05-28 MED ORDER — IPRATROPIUM-ALBUTEROL 0.5-2.5 (3) MG/3ML IN SOLN
3.0000 mL | Freq: Once | RESPIRATORY_TRACT | Status: AC
  Administered 2015-05-28: 3 mL via RESPIRATORY_TRACT

## 2015-05-28 MED ORDER — PREDNISONE 20 MG PO TABS: 20.0000 mg | ORAL_TABLET | Freq: Every day | ORAL | Status: DC

## 2015-05-28 MED ORDER — AZITHROMYCIN 250 MG PO TABS: ORAL_TABLET | ORAL | Status: DC

## 2015-05-28 MED ORDER — HYDROCOD POLST-CPM POLST ER 10-8 MG/5ML PO SUER: 5.0000 mL | Freq: Two times a day (BID) | ORAL | Status: DC | PRN

## 2015-05-28 NOTE — Addendum Note (Signed)
Addended by: Colleen Can A on: 05/28/2015 10:40 AM   Modules accepted: Orders

## 2015-05-28 NOTE — Progress Notes (Signed)
S/ 54 y/o vet with h/o RAD and seasonal allergies, 4 d hx uri sxs and increased wheezing , sob,  O/ mildly winded with talking NAD  ENT viral changes. Neck supple without nodes Heart rsr Lungs clear with scattered wheezes  A/ uri with rad  P/  duoneb given with subjective relief . viral course discussed and supportive measures encouraged.  rx for antbx to have on hand if sxs persist beyond viral timeline. Supportive measures.  rx pred taper. rx tussionex written i tsp q 12 h prn 100 cc o rf.  If sxs not improving seek urgent care.

## 2015-07-19 ENCOUNTER — Other Ambulatory Visit: Payer: Self-pay | Admitting: Family Medicine

## 2015-07-19 MED FILL — AMLODIPINE BESYLATE 10 MG T: 10 | 90 days supply | Qty: 90 | Fill #0

## 2015-07-19 MED FILL — OMEPRAZOLE DR 40 MG CAPSULE: 40 | 90 days supply | Qty: 90 | Fill #0

## 2015-07-19 MED FILL — LOSARTAN POTASSIUM 100 MG T: 100 | 90 days supply | Qty: 90 | Fill #0

## 2015-07-24 ENCOUNTER — Encounter: Payer: Self-pay | Admitting: Family Medicine

## 2015-07-24 ENCOUNTER — Telehealth: Payer: Self-pay | Admitting: Family Medicine

## 2015-07-24 NOTE — Telephone Encounter (Signed)
Pt did not come in for their appt today for cpe. Please let me know if pt needs to be contacted immediately for follow up or no follow up needed. Best phone number to contact pt is [phone].

## 2015-07-25 NOTE — Telephone Encounter (Signed)
Austin Houston should f/u for his physical, but it can be any time in the next couple of months when he has time.

## 2015-07-25 NOTE — Telephone Encounter (Signed)
Called pt and left voicemail to call back and reschedule appt at his convenience per [place].

## 2015-09-25 ENCOUNTER — Ambulatory Visit (INDEPENDENT_AMBULATORY_CARE_PROVIDER_SITE_OTHER): Payer: 59 | Admitting: Family Medicine

## 2015-09-25 ENCOUNTER — Encounter: Payer: Self-pay | Admitting: Family Medicine

## 2015-09-25 VITALS — BP 130/80 | HR 57 | Temp 97.8°F | Ht 72.75 in | Wt 222.2 lb

## 2015-09-25 DIAGNOSIS — Z79899 Other long term (current) drug therapy: Secondary | ICD-10-CM

## 2015-09-25 DIAGNOSIS — Z0000 Encounter for general adult medical examination without abnormal findings: Secondary | ICD-10-CM

## 2015-09-25 DIAGNOSIS — R945 Abnormal results of liver function studies: Secondary | ICD-10-CM

## 2015-09-25 DIAGNOSIS — Z125 Encounter for screening for malignant neoplasm of prostate: Secondary | ICD-10-CM

## 2015-09-25 DIAGNOSIS — E785 Hyperlipidemia, unspecified: Secondary | ICD-10-CM | POA: Diagnosis not present

## 2015-09-25 DIAGNOSIS — R7989 Other specified abnormal findings of blood chemistry: Secondary | ICD-10-CM | POA: Diagnosis not present

## 2015-09-25 LAB — HEPATIC FUNCTION PANEL
ALBUMIN: 4.7 g/dL (ref 3.5–5.2)
ALT: 97 U/L — ABNORMAL HIGH (ref 0–53)
AST: 85 U/L — ABNORMAL HIGH (ref 0–37)
Alkaline Phosphatase: 54 U/L (ref 39–117)
BILIRUBIN TOTAL: 0.6 mg/dL (ref 0.2–1.2)
Bilirubin, Direct: 0.2 mg/dL (ref 0.0–0.3)
Total Protein: 7.1 g/dL (ref 6.0–8.3)

## 2015-09-25 LAB — BASIC METABOLIC PANEL
BUN: 13 mg/dL (ref 6–23)
CHLORIDE: 100 meq/L (ref 96–112)
CO2: 30 mEq/L (ref 19–32)
Calcium: 9.4 mg/dL (ref 8.4–10.5)
Creatinine, Ser: 0.95 mg/dL (ref 0.40–1.50)
GFR: 88.07 mL/min (ref >60.00)
GLUCOSE: 81 mg/dL (ref 70–99)
POTASSIUM: 4.3 meq/L (ref 3.5–5.1)
SODIUM: 138 meq/L (ref 135–145)

## 2015-09-25 LAB — CBC WITH DIFFERENTIAL/PLATELET
BASOS ABS: 0 10*3/uL (ref 0.0–0.1)
Basophils Relative: 0.7 % (ref 0.0–3.0)
EOS PCT: 3.4 % (ref 0.0–5.0)
Eosinophils Absolute: 0.2 10*3/uL (ref 0.0–0.7)
HCT: 41.9 % (ref 39.0–52.0)
Hemoglobin: 14.5 g/dL (ref 13.0–17.0)
LYMPHS ABS: 1.3 10*3/uL (ref 0.7–4.0)
Lymphocytes Relative: 23 % (ref 12.0–46.0)
MCHC: 34.5 g/dL (ref 30.0–36.0)
MCV: 94 fl (ref 78.0–100.0)
MONO ABS: 0.4 10*3/uL (ref 0.1–1.0)
MONOS PCT: 7.6 % (ref 3.0–12.0)
NEUTROS ABS: 3.7 10*3/uL (ref 1.4–7.7)
NEUTROS PCT: 65.3 % (ref 43.0–77.0)
PLATELETS: 279 10*3/uL (ref 150.0–400.0)
RBC: 4.46 Mil/uL (ref 4.22–5.81)
RDW: 12.3 % (ref 11.5–15.5)
WBC: 5.6 10*3/uL (ref 4.0–10.5)

## 2015-09-25 LAB — LIPID PANEL
CHOLESTEROL: 228 mg/dL — AB (ref 0–200)
HDL: 91.6 mg/dL (ref >39.00)
LDL Cholesterol: 123 mg/dL — ABNORMAL HIGH (ref 0–99)
NonHDL: 135.95
Total CHOL/HDL Ratio: 2
Triglycerides: 64 mg/dL (ref 0.0–149.0)
VLDL: 12.8 mg/dL (ref 0.0–40.0)

## 2015-09-25 LAB — PSA: PSA: 4.67 ng/mL — ABNORMAL HIGH (ref 0.10–4.00)

## 2015-09-25 MED ORDER — AMLODIPINE BESYLATE 10 MG PO TABS: 10.0000 mg | ORAL_TABLET | Freq: Every day | ORAL | Status: DC

## 2015-09-25 MED ORDER — ALBUTEROL SULFATE HFA 108 (90 BASE) MCG/ACT IN AERS: INHALATION_SPRAY | RESPIRATORY_TRACT | Status: DC

## 2015-09-25 MED ORDER — LOSARTAN POTASSIUM 100 MG PO TABS: 100.0000 mg | ORAL_TABLET | Freq: Every day | ORAL | Status: DC

## 2015-09-25 MED ORDER — OMEPRAZOLE 40 MG PO CPDR: 40.0000 mg | DELAYED_RELEASE_CAPSULE | Freq: Every day | ORAL | Status: DC

## 2015-09-25 NOTE — Progress Notes (Signed)
Dr. Frederico Hamman T. Austin Hinchey, MD, [facility] [place], [address] Phone: [phone] Fax: [phone]  09/25/2015  Patient: Austin Houston, MRN: [id number], DOB: Jan 24, 1961, 55 y.o.  Primary Physician:  Austin Loffler, MD   Chief Complaint  Patient presents with  . Annual Exam   Subjective:   Austin Houston is a 55 y.o. pleasant patient who presents with the following:  Preventative Health Maintenance Visit:  Health Maintenance Summary Reviewed and updated, unless pt declines services.  Tobacco History Reviewed. Alcohol: No concerns, no excessive use Exercise Habits: Some activity, rec at least 30 mins 5 times a week STD concerns: no risk or activity to increase risk Drug Use: None Encouraged self-testicular check  Feeling really good. Lost 15 pounds.  Health Maintenance  Topic Date Due  . HIV Screening  07/11/1977  . INFLUENZA VACCINE  02/11/2016  . TETANUS/TDAP  07/13/2016  . COLONOSCOPY  01/30/2019  . Hepatitis C Screening  Completed   Immunization History  Administered Date(s) Administered  . Influenza,inj,Quad PF,36+ Mos 04/13/2015   Patient Active Problem List   Diagnosis Date Noted  . 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[place])     Priority: High  . Family history of prostate cancer in father 06/19/2014  . Elevated LFTs 06/19/2014  . Varicose veins of lower extremities with other complications 95/28/4132  . Former smokeless tobacco use 11/25/2012  . GERD (gastroesophageal reflux disease)   . Allergic rhinitis due to pollen   . Hypertension   . Hyperlipidemia   . PTSD (post-traumatic stress disorder)    Past Medical History  Diagnosis Date  . 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[place])     s/p partial thyroidectomy (follicular adenoma)  . GERD (gastroesophageal reflux disease)   . Allergic rhinitis due to pollen   . Hypertension   . Hyperlipidemia   . PTSD (post-traumatic stress disorder)     s/p  multiple active deployments for [employer], no medications taken currently  . Allergy   . Asthma     vs COPD- taking inhalers  . COPD (chronic obstructive pulmonary disease) ([facility])     vs asthma   Past Surgical History  Procedure Laterality Date  . Inguinal hernia repair      right  . Thyroidectomy, partial      Tami Ribas [facility])   Social History   Social History  . Marital Status: Married    Spouse Name: N/A  . Number of Children: N/A  . Years of Education: N/A   Occupational History  . 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 Smoking status: Never Smoker   . Smokeless tobacco: Former Systems developer    Types: Snuff    Quit date: 12/03/2012  . Alcohol Use: 6.0 oz/week    10 Cans of beer per week     Comment: moderate  . Drug Use: No  . Sexual Activity:    Partners: Female   Other Topics Concern  . 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 Heart disease Paternal Grandmother   . Prostate cancer  Father     Recurrent x 3  . Hyperlipidemia Father   . Other Father     varicose veins  . Hyperlipidemia Mother   . Other Mother     varicose veins  . Hypertension Maternal Grandmother   . Hypertension Maternal Grandfather   . Sudden death Brother 18  . Colon cancer Neg Hx   . Esophageal cancer Neg Hx   . Rectal cancer Neg Hx   . Stomach cancer Neg Hx    Allergies  Allergen Reactions  . Vioxx [Rofecoxib] Anaphylaxis  . Celebrex [Celecoxib] Rash  . Codeine Rash  . Doxycycline Rash    Medication list has been reviewed and updated.   General: Denies fever, chills, sweats. No significant weight loss. Eyes: Denies blurring,significant itching ENT: Denies earache, sore throat,  and hoarseness. Cardiovascular: Denies chest pains, palpitations, dyspnea on exertion Respiratory: Denies cough, dyspnea at rest,wheeezing Breast: no concerns about lumps GI: Denies nausea, vomiting, diarrhea, constipation, change in bowel habits, abdominal pain, melena, hematochezia GU: Denies penile discharge, (occ ED), urinary flow / outflow problems. No STD concerns. Musculoskeletal: Denies back pain, joint pain Derm: Denies rash, itching Neuro: Denies  paresthesias, frequent falls, frequent headaches Psych: Denies depression, anxiety Endocrine: Denies cold intolerance, heat intolerance, polydipsia Heme: Denies enlarged lymph nodes Allergy: No hayfever  Objective:   BP 130/80 mmHg  Pulse 57  Temp(Src) 97.8 F (36.6 C) (Oral)  Ht 6' 0.75" (1.848 m)  Wt 222 lb 4 oz (100.812 kg)  BMI 29.52 kg/m2 Ideal Body Weight: Weight in (lb) to have BMI = 25: 187.8  No exam data present  GEN: well developed, well nourished, no acute distress Eyes: conjunctiva and lids normal, PERRLA, EOMI ENT: TM clear, nares clear, oral exam WNL Neck: supple, no lymphadenopathy, no thyromegaly, no JVD Pulm: clear to auscultation and percussion, respiratory effort normal CV: regular rate and rhythm, S1-S2, no murmur, rub or gallop, no bruits, peripheral pulses normal and symmetric, no cyanosis, clubbing, edema or varicosities GI: soft, non-tender; no hepatosplenomegaly, masses; active bowel sounds all quadrants GU: no hernia, testicular mass, penile discharge Lymph: no cervical, axillary or inguinal adenopathy MSK: gait normal, muscle tone and strength WNL, no joint swelling, effusions, discoloration, crepitus  SKIN: clear, good turgor, color WNL, no rashes, lesions, or ulcerations Neuro: normal mental status, normal strength, sensation, and motion Psych: alert; oriented to person, place and time, normally interactive and not anxious or depressed in appearance. All labs reviewed with patient.  Lipids:     Component Value Date/Time   CHOL 214* 06/15/2014 1210   TRIG 93.0 06/15/2014 1210   HDL 76.20 06/15/2014 1210   VLDL 18.6 06/15/2014 1210   CHOLHDL 3 06/15/2014 1210   CBC: CBC Latest Ref Rng 06/15/2014 04/13/2013  WBC 4.0 - 10.5 K/uL 4.8 4.3(L)  Hemoglobin 13.0 - 17.0 g/dL 15.0 14.3  Hematocrit 39.0 - 52.0 % 43.4 41.7  Platelets 150.0 - 400.0 K/uL 277.0 294.7    Basic Metabolic Panel:    Component Value Date/Time   NA 138 06/15/2014 1210   K 4.5 06/15/2014 1210   CL 104 06/15/2014 1210   CO2 24 06/15/2014 1210   BUN 15 06/15/2014 1210   CREATININE 1.2 06/15/2014 1210   GLUCOSE 93 06/15/2014 1210   CALCIUM 9.3 06/15/2014 1210   Hepatic Function Latest Ref Rng 06/15/2014 04/13/2013  Total Protein 6.0 - 8.3 g/dL 7.2 6.7  Albumin 3.5 - 5.2 g/dL 4.5 4.2  AST 0 - 37 U/L 64(H) 47(H)  ALT  0 - 53 U/L 96(H) 75(H)  Alk Phosphatase 39 - 117 U/L 55 54  Total Bilirubin 0.2 - 1.2 mg/dL 1.0 0.7  Bilirubin, Direct 0.0 - 0.3 mg/dL - 0.1    Lab Results  Component Value Date   TSH 3.01 06/15/2014   Lab Results  Component Value Date   PSA 3.47 06/15/2014   PSA 2.50 04/13/2013    Assessment and Plan:   Healthcare maintenance  Hyperlipidemia - Plan: Lipid panel  Elevated LFTs - Plan: Hepatic function panel  Screening PSA (prostate specific antigen) - Plan: PSA  Encounter for long-term (current) use of medications - Plan: Basic metabolic panel, CBC with Differential/Platelet  Health Maintenance Exam: The patient's preventative maintenance and recommended screening tests for an annual wellness exam were reviewed in full today. Brought up to date unless services declined.  Counselled on the importance of diet, exercise, and its role in overall health and mortality. The patient's FH and SH was reviewed, including their home life, tobacco status, and drug and alcohol status.  Doing well overall and feeling well.  Follow-up: No Follow-up on file. Unless noted, follow-up in 1  year for Health Maintenance Exam.  New Prescriptions   No medications on file   Modified Medications   Modified Medication Previous Medication   ALBUTEROL (VENTOLIN HFA) 108 (90 BASE) MCG/ACT INHALER VENTOLIN HFA 108 (90 BASE) MCG/ACT inhaler      INHALE 2 PUFFS INTO THE LUNGS EVERY 6 (SIX) HOURS AS NEEDED FOR WHEEZING.    INHALE 2 PUFFS INTO THE LUNGS EVERY 6 (SIX) HOURS AS NEEDED FOR WHEEZING.   AMLODIPINE (NORVASC) 10 MG TABLET amLODipine (NORVASC) 10 MG tablet      Take 1 tablet (10 mg total) by mouth daily.    TAKE 1 TABLET BY MOUTH DAILY   LOSARTAN (COZAAR) 100 MG TABLET losartan (COZAAR) 100 MG tablet      Take 1 tablet (100 mg total) by mouth daily.    TAKE 1 TABLET BY MOUTH DAILY.   OMEPRAZOLE (PRILOSEC) 40 MG CAPSULE omeprazole (PRILOSEC) 40 MG capsule      Take 1 capsule (40 mg total) by mouth daily.    TAKE 1 CAPSULE BY MOUTH DAILY.   Orders Placed This Encounter  Procedures  . Basic metabolic panel  . CBC with Differential/Platelet  . Hepatic function panel  . Lipid panel  . PSA    Signed,  Austin Hamman T. Aleksandar Duve, MD   Patient's Medications  New Prescriptions   No medications on file  Previous Medications   ASPIRIN 81 MG TABLET    Take 81 mg by mouth daily.     CETIRIZINE (ZYRTEC) 10 MG TABLET    Take 10 mg by mouth daily.   NAPROXEN SODIUM (ANAPROX) 220 MG TABLET    Take 440 mg by mouth 2 (two) times daily with a meal.   ONDANSETRON (ZOFRAN) 4 MG TABLET    Take 1 tablet (4 mg total) by mouth every 8 (eight) hours as needed for nausea or vomiting.   PROMETHAZINE (PHENERGAN) 25 MG SUPPOSITORY    Place 1 suppository (25 mg total) rectally every 6 (six) hours as needed for nausea or vomiting.  Modified Medications   Modified Medication Previous Medication   ALBUTEROL (VENTOLIN HFA) 108 (90 BASE) MCG/ACT INHALER VENTOLIN HFA 108 (90 BASE) MCG/ACT inhaler      INHALE 2 PUFFS INTO THE LUNGS EVERY 6 (SIX) HOURS AS NEEDED FOR WHEEZING.    INHALE 2 PUFFS INTO THE LUNGS EVERY  6 (SIX) HOURS AS NEEDED FOR WHEEZING.   AMLODIPINE (NORVASC) 10 MG TABLET amLODipine (NORVASC) 10 MG tablet      Take 1 tablet (10 mg total) by mouth daily.    TAKE 1 TABLET BY MOUTH DAILY   LOSARTAN (COZAAR) 100 MG TABLET losartan (COZAAR) 100 MG tablet      Take 1 tablet (100 mg total) by mouth daily.    TAKE 1 TABLET BY MOUTH DAILY.   OMEPRAZOLE (PRILOSEC) 40 MG CAPSULE omeprazole (PRILOSEC) 40 MG capsule      Take 1 capsule (40 mg total) by mouth daily.    TAKE 1 CAPSULE BY MOUTH DAILY.  Discontinued Medications   ATORVASTATIN (LIPITOR) 40 MG TABLET    TAKE 1 TABLET BY MOUTH DAILY   AZITHROMYCIN (ZITHROMAX) 250 MG TABLET    As directed   CHLORPHENIRAMINE-HYDROCODONE (TUSSIONEX PENNKINETIC ER) 10-8 MG/5ML SUER    Take 5 mLs by mouth every 12 (twelve) hours as needed for cough.   PREDNISONE (DELTASONE) 20 MG TABLET    Take 1 tablet (20 mg total) by mouth daily with breakfast.

## 2015-09-25 NOTE — Progress Notes (Signed)
Pre visit review using our clinic review tool, if applicable. No additional management support is needed unless otherwise documented below in the visit note. 

## 2015-10-01 ENCOUNTER — Telehealth: Payer: Self-pay | Admitting: Family Medicine

## 2015-10-01 NOTE — Telephone Encounter (Signed)
Patient returned Donna's call.  Patient said he's agreeable to referral, but would like to wait until another PSA is drawn.

## 2015-10-01 NOTE — Telephone Encounter (Signed)
Patient returned Donna's call. °

## 2015-10-01 NOTE — Telephone Encounter (Signed)
See result note on 09/25/2015 labs.

## 2015-10-02 ENCOUNTER — Other Ambulatory Visit: Payer: Self-pay | Admitting: Family Medicine

## 2015-10-02 DIAGNOSIS — R972 Elevated prostate specific antigen [PSA]: Secondary | ICD-10-CM

## 2015-10-02 NOTE — Telephone Encounter (Signed)
Left message for [name] to repeat PSA in about 4 weeks per Dr. Lorelei Pont.

## 2015-10-02 NOTE — Telephone Encounter (Signed)
Please call [name] - I think that is a very reasonable approach, too. Let's repeat a PSA in a about 4 weeks. He can schedule a lab draw when it is convenient to him sometime around that time.

## 2015-10-03 ENCOUNTER — Encounter: Payer: Self-pay | Admitting: Family Medicine

## 2015-10-04 ENCOUNTER — Encounter: Payer: Self-pay | Admitting: Family Medicine

## 2015-10-04 NOTE — Telephone Encounter (Signed)
Medication is not on pts current med list. Last f/u 09/2015.  pls advise

## 2015-10-07 ENCOUNTER — Encounter: Payer: Self-pay | Admitting: Family Medicine

## 2015-10-07 MED ORDER — SILDENAFIL CITRATE 20 MG PO TABS: ORAL_TABLET | ORAL | Status: DC

## 2015-10-07 NOTE — Telephone Encounter (Signed)
DONE

## 2015-11-01 ENCOUNTER — Other Ambulatory Visit (INDEPENDENT_AMBULATORY_CARE_PROVIDER_SITE_OTHER): Payer: 59

## 2015-11-01 DIAGNOSIS — R972 Elevated prostate specific antigen [PSA]: Secondary | ICD-10-CM | POA: Diagnosis not present

## 2015-11-02 LAB — PSA, TOTAL AND FREE
PSA FREE PCT: 10 % — AB (ref >25)
PSA FREE: 0.43 ng/mL
PSA: 4.36 ng/mL — ABNORMAL HIGH (ref <=4.00)

## 2015-11-04 ENCOUNTER — Encounter: Payer: Self-pay | Admitting: Family Medicine

## 2015-11-04 DIAGNOSIS — R972 Elevated prostate specific antigen [PSA]: Secondary | ICD-10-CM

## 2015-11-12 MED FILL — OMEPRAZOLE DR 40 MG CAPSULE: 40 | 90 days supply | Qty: 90 | Fill #0

## 2015-11-12 MED FILL — LOSARTAN POTASSIUM 100 MG T: 100 | 90 days supply | Qty: 90 | Fill #0

## 2015-11-12 MED FILL — AMLODIPINE BESYLATE 10 MG T: 10 | 90 days supply | Qty: 90 | Fill #0

## 2015-12-06 DIAGNOSIS — Z0000 Encounter for general adult medical examination without abnormal findings: Secondary | ICD-10-CM | POA: Diagnosis not present

## 2015-12-06 DIAGNOSIS — R972 Elevated prostate specific antigen [PSA]: Secondary | ICD-10-CM | POA: Diagnosis not present

## 2015-12-06 MED FILL — levoFLOXacin 500 MG TABS: 500 | 3 days supply | Qty: 3 | Fill #0

## 2015-12-19 DIAGNOSIS — R972 Elevated prostate specific antigen [PSA]: Secondary | ICD-10-CM | POA: Diagnosis not present

## 2015-12-19 DIAGNOSIS — D075 Carcinoma in situ of prostate: Secondary | ICD-10-CM | POA: Diagnosis not present

## 2015-12-20 ENCOUNTER — Encounter: Payer: Self-pay | Admitting: Family Medicine

## 2016-01-01 ENCOUNTER — Ambulatory Visit: Payer: 59 | Admitting: Family Medicine

## 2016-01-01 ENCOUNTER — Telehealth: Payer: Self-pay | Admitting: Family Medicine

## 2016-01-01 DIAGNOSIS — Z0289 Encounter for other administrative examinations: Secondary | ICD-10-CM

## 2016-01-01 NOTE — Telephone Encounter (Signed)
Austin Houston can reschedule at his discretion

## 2016-01-01 NOTE — Telephone Encounter (Signed)
Patient did not come for their scheduled appointment todayLeft shoulder pain. Rotator cuff? Follow up re: +PCa biopsies. 3 of 12 +. .   Please let me know if the patient needs to be contacted immediately for follow up or if no follow up is necessary.

## 2016-01-08 ENCOUNTER — Ambulatory Visit (INDEPENDENT_AMBULATORY_CARE_PROVIDER_SITE_OTHER): Payer: 59 | Admitting: Family Medicine

## 2016-01-08 ENCOUNTER — Encounter: Payer: Self-pay | Admitting: Family Medicine

## 2016-01-08 VITALS — BP 108/64 | HR 60 | Temp 97.8°F | Ht 72.75 in | Wt 216.8 lb

## 2016-01-08 DIAGNOSIS — C61 Malignant neoplasm of prostate: Secondary | ICD-10-CM | POA: Diagnosis not present

## 2016-01-08 DIAGNOSIS — M7542 Impingement syndrome of left shoulder: Secondary | ICD-10-CM | POA: Diagnosis not present

## 2016-01-08 DIAGNOSIS — M7582 Other shoulder lesions, left shoulder: Secondary | ICD-10-CM

## 2016-01-08 DIAGNOSIS — M7552 Bursitis of left shoulder: Secondary | ICD-10-CM

## 2016-01-08 HISTORY — DX: Malignant neoplasm of prostate: C61

## 2016-01-08 NOTE — Progress Notes (Signed)
Dr. Frederico Hamman T. Kiyanna Biegler, MD, [facility] [place], [address] Phone: [phone] Fax: [phone]  01/08/2016  Patient: Austin Houston, MRN: [id number], DOB: [birth year], 55 y.o.  Primary Physician:  Owens Loffler, MD   Chief Complaint  Patient presents with  . Shoulder Pain    Left x 8 to 10 weeks   Subjective:   This 55 y.o. male patient noted above presents with shoulder pain that has been ongoing for 8-10 weeks there is no history of trauma or accident recently The patient denies neck pain or radicular symptoms. Denies dislocation, subluxation, separation of the shoulder. The patient does complain of pain in the overhead plane with significant painful arc of motion.  L RTC tendonitis  Left shoulder, has been ongoing for about eight or ten weeks. Certain things hurt a lot - a constant ache. Has been taken some alleve and in the mornings. Wife has a compound with lidocaine and asa. Cannot really apply at work.  Cannot flex arm and now pain with   Recent dx prostate ca  Medications Tried: Tylenol, NSAIDS Ice or Heat: minimally helpful Tried PT: No  Prior shoulder Injury: No Prior surgery: No Prior fracture: No  The PMH, PSH, Social History, Family History, Medications, and allergies have been reviewed in [facility], and have been updated if relevant.  Patient Active Problem List   Diagnosis Date Noted  . 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 Prostate cancer ([place]) 01/08/2016  . Family history of prostate cancer in father 06/19/2014  . Varicose veins of lower extremities with other complications [id number]  . Former smokeless tobacco use 11/25/2012  . GERD (gastroesophageal reflux disease)   . Allergic rhinitis due to pollen   . Hypertension   . Hyperlipidemia   . PTSD (post-traumatic stress disorder)     Past Medical History  Diagnosis Date  . 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 GERD (gastroesophageal reflux disease)   . Allergic rhinitis due to pollen   . Hypertension   . Hyperlipidemia   . PTSD (post-traumatic stress disorder)     s/p multiple active deployments for [employer], no medications taken currently  . Allergy   . Asthma     vs COPD- taking inhalers  . COPD (chronic obstructive pulmonary disease) (HCC)     vs asthma  . Prostate cancer ([place]) 01/08/2016    Past Surgical History  Procedure Laterality Date  . Inguinal hernia repair      right  . Thyroidectomy, partial      Tami Ribas [facility])    Social History   Social History  . Marital Status: Married    Spouse Name: N/A  . Number of Children: N/A  . Years of Education: N/A   Occupational History  . 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 Smoking status: Never Smoker   . Smokeless tobacco: Former Systems developer    Types: Snuff    Quit date: 12/03/2012  . Alcohol Use: 6.0 oz/week    10 Cans of beer per week     Comment: moderate  . Drug Use: No  . Sexual Activity:    Partners: Female   Other Topics Concern  . 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   Active duty and Magazine features editor    Family History  Problem Relation Age of Onset  . Heart disease Paternal Grandmother   . Prostate cancer Father     Recurrent x 3  . Hyperlipidemia Father   . Other Father     varicose veins  . Hyperlipidemia Mother   . Other Mother     varicose veins  . Hypertension Maternal Grandmother   . Hypertension Maternal Grandfather   . Sudden death Brother 67  . Colon cancer Neg Hx   . Esophageal cancer Neg Hx   . Rectal cancer Neg Hx   . Stomach cancer Neg Hx     Allergies  Allergen Reactions  . Vioxx [Rofecoxib] Anaphylaxis  .  Celebrex [Celecoxib] Rash  . Codeine Rash  . Doxycycline Rash    Medication list reviewed and updated in full in [facility].  GEN: No fevers, chills. Nontoxic. Primarily MSK c/o today. MSK: Detailed in the HPI GI: tolerating PO intake without difficulty Neuro: No numbness, parasthesias, or tingling associated. Otherwise the pertinent positives of the ROS are noted above.   Objective:   Blood pressure 108/64, pulse 60, temperature 97.8 F (36.6 C), temperature source Oral, height 6' 0.75" (1.848 m), weight 216 lb 12 oz (98.317 kg).  GEN: Well-developed,well-nourished,in no acute distress; alert,appropriate and cooperative throughout examination HEENT: Normocephalic and atraumatic without obvious abnormalities. Ears, externally no deformities PULM: Breathing comfortably in no respiratory distress EXT: No clubbing, cyanosis, or edema PSYCH: Normally interactive. Cooperative during the interview. Pleasant. Friendly and conversant. Not anxious or depressed appearing. Normal, full affect.  Shoulder: L Inspection: No muscle wasting or winging Ecchymosis/edema: neg  AC joint, scapula, clavicle: NT Cervical spine: NT, full ROM Spurling's: neg Abduction: full, 5/5 Flexion: full, 5/5 IR, full, lift-off: 5/5 ER at neutral: full, 5/5 AC crossover: neg Neer: pos Hawkins: pos Drop Test: neg Empty Can: pos Supraspinatus insertion: mild-mod T Bicipital groove: NT Speed's: neg Yergason's: neg Sulcus sign: neg Scapular dyskinesis: none C5-T1 intact  Neuro: Sensation intact Grip 5/5   Radiology: No results found.  Assessment and Plan:    Shoulder impingement, left - Plan: Ambulatory referral to Physical Therapy  Rotator cuff tendonitis, left - Plan: Ambulatory referral to Physical Therapy  Subacromial bursitis, left - Plan: Ambulatory referral to Physical Therapy  Prostate cancer ([place])  >25 minutes spent in face to face time with patient, >50% spent in counselling or  coordination of care: Additional time spent in discussing the patient's new diagnosis of prostate cancer, counseling, and discussing different types of treatment options. He has good follow-up and will be seeing Dr. Alinda Money in short order.  Rotator cuff strengthening and scapular stabilization exercises were reviewed with the patient.  MOON shoulder protocol given to the patient. Retraining shoulder mechanics and function was emphasized to the patient with rehab done at least 5-6 days a week.  The patient could benefit from formal PT to assist with scapular stabilization and RTC strengthening.   Follow-up: 6-8 weeks if needed  Orders Placed This Encounter  Procedures  . Ambulatory referral to Physical Therapy    Signed,  Frederico Hamman T. Jessicamarie Amiri, MD   Patient's Medications  New Prescriptions   No medications on file  Previous Medications   ALBUTEROL (VENTOLIN HFA) 108 (90 BASE) MCG/ACT INHALER    INHALE 2 PUFFS INTO  THE LUNGS EVERY 6 (SIX) HOURS AS NEEDED FOR WHEEZING.   AMLODIPINE (NORVASC) 10 MG TABLET    Take 1 tablet (10 mg total) by mouth daily.   ASPIRIN 81 MG TABLET    Take 81 mg by mouth daily.     CETIRIZINE (ZYRTEC) 10 MG TABLET    Take 10 mg by mouth daily.   LOSARTAN (COZAAR) 100 MG TABLET    Take 1 tablet (100 mg total) by mouth daily.   NAPROXEN SODIUM (ANAPROX) 220 MG TABLET    Take 440 mg by mouth 2 (two) times daily with a meal.   OMEPRAZOLE (PRILOSEC) 40 MG CAPSULE    Take 1 capsule (40 mg total) by mouth daily.   ONDANSETRON (ZOFRAN) 4 MG TABLET    Take 1 tablet (4 mg total) by mouth every 8 (eight) hours as needed for nausea or vomiting.   PROMETHAZINE (PHENERGAN) 25 MG SUPPOSITORY    Place 1 suppository (25 mg total) rectally every 6 (six) hours as needed for nausea or vomiting.   SILDENAFIL (REVATIO) 20 MG TABLET    Generic Revatio / Sildanefil 20 mg. 2 - 5 tabs 30 mins prior to intercourse.  Modified Medications   No medications on file  Discontinued Medications    No medications on file

## 2016-01-08 NOTE — Progress Notes (Signed)
Pre visit review using our clinic review tool, if applicable. No additional management support is needed unless otherwise documented below in the visit note. 

## 2016-01-16 DIAGNOSIS — C61 Malignant neoplasm of prostate: Secondary | ICD-10-CM | POA: Diagnosis not present

## 2016-01-23 ENCOUNTER — Ambulatory Visit: Payer: 59 | Attending: Family Medicine

## 2016-01-23 DIAGNOSIS — M25512 Pain in left shoulder: Secondary | ICD-10-CM | POA: Diagnosis not present

## 2016-01-23 NOTE — Therapy (Signed)
[facility] [address] [place], [address] Phone: [phone]   Fax:  [phone]  Physical Therapy Evaluation  Patient Details  Name: Austin Houston MRN: [id number] Date of Birth: [birth year] Referring Provider: Owens Loffler, MD  Encounter Date: 01/23/2016      PT End of Session - 01/23/16 0937    Visit Number 1   Number of Visits 5   Date for PT Re-Evaluation 02/20/16   PT Start Time 0937   PT Stop Time 1036   PT Time Calculation (min) 59 min   Activity Tolerance Patient tolerated treatment well   Behavior During Therapy [facility] for tasks assessed/performed      Past Medical History  Diagnosis Date  . 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 GERD (gastroesophageal reflux disease)   . Allergic rhinitis due to pollen   . Hypertension   . Hyperlipidemia   . PTSD (post-traumatic stress disorder)     s/p multiple active deployments for [employer], no medications taken currently  . Allergy   . Asthma     vs COPD- taking inhalers  . COPD (chronic obstructive pulmonary disease) (HCC)     vs asthma  . Prostate cancer ([place]) 01/08/2016    Past Surgical History  Procedure Laterality Date  . Inguinal hernia repair      right  . Thyroidectomy, partial      [name])    There were no vitals filed for this visit.      Subjective Assessment - 01/23/16 0940    Subjective L shoulder: 0.5/10 current. Pt states that his L shoulder pain is always there but not more than a 1-2/10. 2/10 at most when he sleeps on his L shoulder.    Pertinent History L shoulder pain. Has been performing the MOON shoulder protocol which has been helping. 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 Also took an 8 hour car ride at the end of April 2017 which excacerbated his L shoulder pain. Driving and moving patients (at work) increases his shoulder  symptoms. Was recently diagnosed with prostate CA but localized. The surgery for his thyroid also took care of his thyroid CA.    Patient Stated Goals No pain.    Currently in Pain? Yes   Pain Score 1   0.5/10   Pain Location Shoulder   Pain Orientation Left   Pain Descriptors / Indicators Aching   Pain Type Acute pain   Pain Onset More than a month ago  Mid April 2017   Pain Frequency Constant   Aggravating Factors  sleeping on his L side, driving, lifting patients at work   Pain Relieving Factors naproxed sodium, rest, hot shower. Pt states that ice does not help nearly as much as heat from a hot shower.    Multiple Pain Sites No            [facility] PT Assessment - 01/23/16 0950    Assessment   Medical Diagnosis L shoulder impingement, rotator cuff tendonitis, subacromial bursitis   Referring Provider Owens Loffler, MD   Onset Date/Surgical Date 10/28/15  Mid April 2017. No specific date provided.    Hand Dominance Right   Next MD Visit None scheduled yet.    Prior Therapy None   Precautions   Precaution Comments No known precautions   Restrictions   Other Position/Activity Restrictions  No known restrictions   Balance Screen   Has the patient fallen in the past 6 months No   Has the patient had a decrease in activity level because of a fear of falling?  No   Is the patient reluctant to leave their home because of a fear of falling?  No   Prior Function   Vocation Full time employment   Vocation Requirements PLOF: able to drive, sleep on his L side, lift patients at work without L shoulder pain   Observation/Other Assessments   Observations (-) empty can test, (+) Michel Bickers, Yocum, and Neer's impingement tests   Quick DASH  16%   Posture/Postural Control   Posture Comments Bilaterally protracted shoulders and neck. slight L lateral shift, L shoulder blade higher   AROM   Overall AROM Comments painful arc L shoulder for flexion and abduction   Right Shoulder  Flexion 151 Degrees   Right Shoulder ABduction 154 Degrees   Left Shoulder Flexion 150 Degrees  with pain; PROM 160 degrees   Left Shoulder ABduction 156 Degrees  with pain; PROM 175 degrees with pain   Left Shoulder Internal Rotation 82 Degrees  with discomfort; PROM 92 degrees   Left Shoulder External Rotation 91 Degrees   Strength   Right Shoulder Flexion 5/5   Right Shoulder ABduction 5/5   Right Shoulder Internal Rotation 5/5   Right Shoulder External Rotation 4+/5   Left Shoulder Flexion 5/5   Left Shoulder ABduction 4+/5  with discomfort   Left Shoulder Internal Rotation 5/5   Left Shoulder External Rotation 5/5  with slight shoulder pain   Palpation   Palpation comment Slight decreased mobility posterior and inferior L glenohumeral joint capsule. slight decreased inferior and posterior glide L distal clavicle            Objectives:  Manual therapy:   Posterior and inferior glide L shoulder joint in supine, with L arm in about 90 to 100 degrees flexion and then abduction grade 3  Decreased pain with shoulder flexion and abduction  Caudal glide L distal clavilcle grade 3  Decreased pain with shoulder abduction     There-ex  Directed patient with L shoulder ER in about 90 degrees scaption, with manual resistance 10x2.  Decreased L shoulder pain with abduction  Improved exercise technique, movement at target joints, use of target muscles after mod verbal, visual, tactile cues.      Decreased L shoulder pain wit flexion and abduction after manual therapy and there-ex           PT Education - 01/23/16 2145    Education provided Yes   Education Details plan of care   Person(s) Educated Patient   Methods Explanation   Comprehension Verbalized understanding             PT Long Term Goals - 01/23/16 2149    PT LONG TERM GOAL #1   Title Patient will have a decrease in L shoulder pain to 0.5/10 at worst to promote ability to drive, and lift  patients at work.     Baseline 2/10 at worst   Time 4   Period Weeks   Status New   PT LONG TERM GOAL #2   Title Patient will improve his Quick Dash Disability/Symptom Score by at least 8% as a demonstration of improved function.    Baseline 16%   Time 4   Period Weeks   Status New   PT LONG TERM GOAL #3  Title Patient will be able to perform L shoulder flexion and abduction AROM without complain of pain to promote ability to raise his arm.   Baseline Painful arc with L shoulder flexion and abduction.    Time 4   Period Weeks   Status New               Plan - 01/23/16 1309    Clinical Impression Statement Patient is a 55 year old male who came to physical therapy secondary to L shoulder pain. He also presents with reproduction of symptoms with L shoulder flexion and abduction, positive special tests suggesting impingement; poor posture, decreased use of ER muscles when raising his arm, slight decreased posterior and inferior L shoulder joint and distal clavicle mobility, and difficulty performing functional tasks such as driving, and lifting patients at work. Patient will benefit from skilled physical therapy services to address the aforementioned deficits.    Rehab Potential Good   Clinical Impairments Affecting Rehab Potential No known clinical impairments affecting rehab potential.    PT Frequency 1x / week   PT Duration 4 weeks   PT Treatment/Interventions Therapeutic exercise;Therapeutic activities;Manual techniques;Electrical Stimulation;Iontophoresis 4mg /ml Dexamethasone;Ultrasound;Patient/family education;Neuromuscular re-education;Dry needling   PT Next Visit Plan manual therapy, scapular control, increasing use of ER muscle when raising his L arm   Consulted and Agree with Plan of Care Patient      Patient will benefit from skilled therapeutic intervention in order to improve the following deficits and impairments:  Pain, Decreased strength, Improper body  mechanics  Visit Diagnosis: Pain in left shoulder - Plan: PT plan of care cert/re-cert     Problem List Patient Active Problem List   Diagnosis Date Noted  . Prostate cancer ([place]) 01/08/2016  . Family history of prostate cancer in father 06/19/2014  . Varicose veins of lower extremities with other complications [id number]  . Former smokeless tobacco use 11/25/2012  . 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 GERD (gastroesophageal reflux disease)   . Allergic rhinitis due to pollen   . Hypertension   . Hyperlipidemia   . PTSD (post-traumatic stress disorder)    Thank you for your referral.   Joneen Boers PT, DPT    Florentina Marquart 01/23/2016, 10:02 PM  [facility] [address] [place], [address] Phone: [phone]   Fax:  [phone]  Name: Austin Houston MRN: [id number] Date of Birth: [birth year]

## 2016-02-24 ENCOUNTER — Other Ambulatory Visit: Payer: Self-pay | Admitting: Family Medicine

## 2016-02-24 MED FILL — AMLODIPINE BESYLATE 10 MG T: 10 | 90 days supply | Qty: 90 | Fill #1

## 2016-02-24 MED FILL — LOSARTAN POTASSIUM 100 MG T: 100 | 90 days supply | Qty: 90 | Fill #1

## 2016-02-24 MED FILL — OMEPRAZOLE DR 40 MG CAPSULE: 40 | 90 days supply | Qty: 90 | Fill #1

## 2016-02-24 MED FILL — ATORVASTATIN 40 MG TABLET: 40 | 90 days supply | Qty: 90 | Fill #0

## 2016-04-02 DIAGNOSIS — C61 Malignant neoplasm of prostate: Secondary | ICD-10-CM | POA: Diagnosis not present

## 2016-05-14 ENCOUNTER — Other Ambulatory Visit: Payer: Self-pay | Admitting: Urology

## 2016-05-18 ENCOUNTER — Other Ambulatory Visit: Payer: Self-pay | Admitting: *Deleted

## 2016-05-18 MED ORDER — ONDANSETRON HCL 4 MG PO TABS: 4.0000 mg | ORAL_TABLET | Freq: Three times a day (TID) | ORAL | 2 refills | Status: DC | PRN

## 2016-05-18 MED FILL — AMLODIPINE BESYLATE 10 MG T: 10 | 90 days supply | Qty: 90 | Fill #2 | Status: TO

## 2016-05-18 MED FILL — OMEPRAZOLE DR 40 MG CAPSULE: 40 | 90 days supply | Qty: 90 | Fill #2 | Status: TO

## 2016-05-18 MED FILL — VENTOLIN HFA 90 MCG INHALER: 108 (90 BAS | 75 days supply | Qty: 54 | Fill #0

## 2016-05-18 MED FILL — LOSARTAN POTASSIUM 100 MG T: 100 | 90 days supply | Qty: 90 | Fill #2 | Status: TO

## 2016-05-18 NOTE — Telephone Encounter (Signed)
Last office visit 01/08/2016.  Last refilled 06/18/2014 for #20 with 2 refills. Ok to refill?

## 2016-05-19 MED FILL — ONDANSETRON HCL 4 MG TABLET: 4 | 7 days supply | Qty: 20 | Fill #0 | Status: TO

## 2016-06-16 DIAGNOSIS — C61 Malignant neoplasm of prostate: Secondary | ICD-10-CM | POA: Diagnosis not present

## 2016-06-22 DIAGNOSIS — C61 Malignant neoplasm of prostate: Secondary | ICD-10-CM | POA: Diagnosis not present

## 2016-06-22 DIAGNOSIS — M6281 Muscle weakness (generalized): Secondary | ICD-10-CM | POA: Diagnosis not present

## 2016-07-08 DIAGNOSIS — M62838 Other muscle spasm: Secondary | ICD-10-CM | POA: Diagnosis not present

## 2016-07-08 DIAGNOSIS — C61 Malignant neoplasm of prostate: Secondary | ICD-10-CM | POA: Diagnosis not present

## 2016-07-08 DIAGNOSIS — M6281 Muscle weakness (generalized): Secondary | ICD-10-CM | POA: Diagnosis not present

## 2016-07-16 ENCOUNTER — Ambulatory Visit (HOSPITAL_COMMUNITY)
Admission: RE | Admit: 2016-07-16 | Discharge: 2016-07-16 | Disposition: A | Discharge status: Home / Self Care | Payer: 59 | Source: Ambulatory Visit | Attending: Anesthesiology | Admitting: Anesthesiology

## 2016-07-16 ENCOUNTER — Encounter (HOSPITAL_COMMUNITY)
Admission: RE | Admit: 2016-07-16 | Discharge: 2016-07-16 | Disposition: A | Discharge status: Home / Self Care | Payer: 59 | Source: Ambulatory Visit | Attending: Urology | Admitting: Urology

## 2016-07-16 ENCOUNTER — Encounter (HOSPITAL_COMMUNITY): Payer: Self-pay

## 2016-07-16 DIAGNOSIS — Z01810 Encounter for preprocedural cardiovascular examination: Secondary | ICD-10-CM | POA: Diagnosis not present

## 2016-07-16 DIAGNOSIS — C61 Malignant neoplasm of prostate: Secondary | ICD-10-CM

## 2016-07-16 DIAGNOSIS — Z01812 Encounter for preprocedural laboratory examination: Secondary | ICD-10-CM | POA: Insufficient documentation

## 2016-07-16 DIAGNOSIS — Z01818 Encounter for other preprocedural examination: Secondary | ICD-10-CM

## 2016-07-16 DIAGNOSIS — I517 Cardiomegaly: Secondary | ICD-10-CM | POA: Diagnosis not present

## 2016-07-16 HISTORY — DX: Other injury of unspecified body region, initial encounter: T14.8XXA

## 2016-07-16 HISTORY — DX: Adverse effect of unspecified anesthetic, initial encounter: T41.45XA

## 2016-07-16 HISTORY — DX: Unspecified hearing loss, bilateral: H91.93

## 2016-07-16 HISTORY — DX: Other complications of anesthesia, initial encounter: T88.59XA

## 2016-07-16 LAB — BASIC METABOLIC PANEL
ANION GAP: 8 (ref 5–15)
BUN: 18 mg/dL (ref 6–20)
CALCIUM: 9.6 mg/dL (ref 8.9–10.3)
CO2: 25 mmol/L (ref 22–32)
Chloride: 102 mmol/L (ref 101–111)
Creatinine, Ser: 1 mg/dL (ref 0.61–1.24)
GFR calc Af Amer: >60 mL/min (ref >60)
GFR calc non Af Amer: >60 mL/min (ref >60)
GLUCOSE: 100 mg/dL — AB (ref 65–99)
Potassium: 4.4 mmol/L (ref 3.5–5.1)
Sodium: 135 mmol/L (ref 135–145)

## 2016-07-16 LAB — CBC
HCT: 40.6 % (ref 39.0–52.0)
Hemoglobin: 14.7 g/dL (ref 13.0–17.0)
MCH: 32.8 pg (ref 26.0–34.0)
MCHC: 36.2 g/dL — ABNORMAL HIGH (ref 30.0–36.0)
MCV: 90.6 fL (ref 78.0–100.0)
PLATELETS: 241 10*3/uL (ref 150–400)
RBC: 4.48 MIL/uL (ref 4.22–5.81)
RDW: 11.8 % (ref 11.5–15.5)
WBC: 4.5 10*3/uL (ref 4.0–10.5)

## 2016-07-16 LAB — ABO/RH: ABO/RH(D): O POS

## 2016-07-16 NOTE — Pre-Procedure Instructions (Signed)
EKG/ CXR done today per MD order. 

## 2016-07-16 NOTE — Patient Instructions (Signed)
Austin Houston  07/16/2016   Your procedure is scheduled on: 07-23-16   Report to [facility] Main  Entrance take [facility]  elevators to 3rd floor to  [place] at  [place]  AM.  Call this number if you have problems the morning of surgery [phone] Follow bowel prep per MD office(Magnesium Citrate 1 bottle and Fleet enema one rectally). Drink clear liquids plentiful day of bowel prep.  Remember: ONLY 1 PERSON MAY GO WITH YOU TO SHORT STAY TO GET  READY MORNING OF YOUR SURGERY.  Do not eat food or drink liquids :After Midnight.     Take these medicines the morning of surgery with A SIP OF WATER:  Amlodipine. Cetirizine. Omeprazole. Eye drops, Inhalers -usual. DO NOT TAKE ANY DIABETIC MEDICATIONS DAY OF YOUR SURGERY                     ________________________________________________________________________              Austin Houston may not have any metal on your body including hair pins and              piercings  Do not wear jewelry, make-up, lotions, powders or perfumes, deodorant             Do not wear nail polish.  Do not shave  48 hours prior to surgery.              Men may shave face and neck.   Do not bring valuables to the hospital. [place].  Contacts, dentures or bridgework may not be worn into surgery.  Leave suitcase in the car. After surgery it may be brought to your room.     Patients discharged the day of surgery will not be allowed to drive home.  Name and phone number of your driver: Austin Houston- spouse [phone] cell  Special Instructions: N/A              Please read over the following fact sheets you were given: _____________________________________________________________________             [facility] - Preparing for Surgery Before surgery, you can play an important role.  Because skin is not sterile, your skin needs to be as free of germs as possible.  You can reduce the number of  germs on your skin by washing with CHG (chlorahexidine gluconate) soap before surgery.  CHG is an antiseptic cleaner which kills germs and bonds with the skin to continue killing germs even after washing. Please DO NOT use if you have an allergy to CHG or antibacterial soaps.  If your skin becomes reddened/irritated stop using the CHG and inform your nurse when you arrive at Short Stay. Do not shave (including legs and underarms) for at least 48 hours prior to the first CHG shower.  You may shave your face/neck. Please follow these instructions carefully:  1.  Shower with CHG Soap the night before surgery and the  morning of Surgery.  2.  If you choose to wash your hair, wash your hair first as usual with your  normal  shampoo.  3.  After you shampoo, rinse your hair and body thoroughly to remove the  shampoo.  4.  Use CHG as you would any other liquid soap.  You can apply chg directly  to the skin and wash                       Gently with a scrungie or clean washcloth.  5.  Apply the CHG Soap to your body ONLY FROM THE NECK DOWN.   Do not use on face/ open                           Wound or open sores. Avoid contact with eyes, ears mouth and genitals (private parts).                       Wash face,  Genitals (private parts) with your normal soap.             6.  Wash thoroughly, paying special attention to the area where your surgery  will be performed.  7.  Thoroughly rinse your body with warm water from the neck down.  8.  DO NOT shower/wash with your normal soap after using and rinsing off  the CHG Soap.                9.  Pat yourself dry with a clean towel.            10.  Wear clean pajamas.            11.  Place clean sheets on your bed the night of your first shower and do not  sleep with pets. Day of Surgery : Do not apply any lotions/deodorants the morning of surgery.  Please wear clean clothes to the hospital/surgery center.  FAILURE TO FOLLOW THESE  INSTRUCTIONS MAY RESULT IN THE CANCELLATION OF YOUR SURGERY PATIENT SIGNATURE_________________________________  NURSE SIGNATURE__________________________________  ________________________________________________________________________  Austin Houston  An incentive spirometer is a tool that can help keep your lungs clear and active. This tool measures how well you are filling your lungs with each breath. Taking long deep breaths may help reverse or decrease the chance of developing breathing (pulmonary) problems (especially infection) following:  A long period of time when you are unable to move or be active. BEFORE THE PROCEDURE   If the spirometer includes an indicator to show your best effort, your nurse or respiratory therapist will set it to a desired goal.  If possible, sit up straight or lean slightly forward. Try not to slouch.  Hold the incentive spirometer in an upright position. INSTRUCTIONS FOR USE  1. Sit on the edge of your bed if possible, or sit up as far as you can in bed or on a chair. 2. Hold the incentive spirometer in an upright position. 3. Breathe out normally. 4. Place the mouthpiece in your mouth and seal your lips tightly around it. 5. Breathe in slowly and as deeply as possible, raising the piston or the ball toward the top of the column. 6. Hold your breath for 3-5 seconds or for as long as possible. Allow the piston or ball to fall to the bottom of the column. 7. Remove the mouthpiece from your mouth and breathe out normally. 8. Rest for a few seconds and repeat Steps 1 through 7 at least 10 times every 1-2 hours when you are awake. Take your time and take a few normal breaths between deep breaths. 9. The spirometer may include an indicator to show your  best effort. Use the indicator as a goal to work toward during each repetition. 10. After each set of 10 deep breaths, practice coughing to be sure your lungs are clear. If you have an incision (the cut  made at the time of surgery), support your incision when coughing by placing a pillow or rolled up towels firmly against it. Once you are able to get out of bed, walk around indoors and cough well. You may stop using the incentive spirometer when instructed by your caregiver.  RISKS AND COMPLICATIONS  Take your time so you do not get dizzy or light-headed.  If you are in pain, you may need to take or ask for pain medication before doing incentive spirometry. It is harder to take a deep breath if you are having pain. AFTER USE  Rest and breathe slowly and easily.  It can be helpful to keep track of a log of your progress. Your caregiver can provide you with a simple table to help with this. If you are using the spirometer at home, follow these instructions: [place] IF:   You are having difficultly using the spirometer.  You have trouble using the spirometer as often as instructed.  Your pain medication is not giving enough relief while using the spirometer.  You develop fever of 100.5 F (38.1 C) or higher. SEEK IMMEDIATE MEDICAL CARE IF:   You cough up bloody sputum that had not been present before.  You develop fever of 102 F (38.9 C) or greater.  You develop worsening pain at or near the incision site. MAKE SURE YOU:   Understand these instructions.  Will watch your condition.  Will get help right away if you are not doing well or get worse. Document Released: 11/09/2006 Document Revised: 09/21/2011 Document Reviewed: 01/10/2007 [facility] Patient Information 2014 [place], [place].   ________________________________________________________________________

## 2016-07-22 NOTE — H&P (Signed)
Office Visit Report     06/16/2016   --------------------------------------------------------------------------------   Austin Houston  MRN: [id number]  PRIMARY CARE:  Owens Loffler, MD  DOB: [birth year], 56 year old Male  REFERRING:    SSN:   PROVIDER:  Raynelle Bring, M.D.    LOCATION:  [facility] [phone]   --------------------------------------------------------------------------------   CC/HPI: CC: Prostate Cancer   PCP: Dr. 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   Family history: None.   Imaging studies: None.   PMH: He has a history of hypertension, GERD, asthma, PTSD, and dyslipidemia. Although he cannot take COX-2 inhibitors, he can take all other NSAIDs.  PSH: Inguinal hernia repair.   TNM stage: cT1c Nx Mx  PSA: 4.36  Gleason score: 3+3=6  Biopsy (01/18/16): 3/12 cores positive  Left: L lateral mid (40%), L mid (20%)  Right: R apex (10%)  Prostate volume: 28.0 cc  PSAD: 0.16   Nomogram  OC disease: 62%  EPE: 37%  SVI: 1%  LNI: 1%  PFS (5 year, 10 year): 95%, 92%   Urinary function: IPSS is 1.  Erectile function: SHIM score is 25.     ALLERGIES: CeleBREX CAPS Codeine Derivatives Doxycycline Hyclate CAPS Vioxx    MEDICATIONS: Adult Aspirin Low Strength 81 MG TBDP Oral  Albuterol 90 MCG/ACT AERS Inhalation  AmLODIPine Besylate 10 MG Oral Tablet Oral  Losartan Potassium 100 MG Oral Tablet Oral  Omeprazole 40 MG Oral Capsule Delayed Release Oral  Zyrtec 10 MG TABS Oral     GU PSH: Prostate Needle Biopsy - 12/19/2015      PSH Notes: Thyroid Surgery Substernal Thyroidectomy Partial, Inguinal Hernia Repair   NON-GU PSH: Surgical Pathology, Gross And  Microscopic Examination For Prostate Needle - 12/19/2015    GU PMH: Prostate Cancer - 01/16/2016    NON-GU PMH: Muscle weakness (generalized) - 06/22/2016 Malignant neoplasm of thyroid gland, 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 Personal history of other diseases of the circulatory system, History of hypertension - 12/05/2015 Personal history of other diseases of the digestive system, History of esophageal reflux - 12/05/2015 Personal history of other diseases of the respiratory system, History of asthma - 12/05/2015 Personal history of other endocrine, nutritional and metabolic disease, History of hyperlipidemia - 12/05/2015 Post-traumatic stress disorder, unspecified, PTSD (post-traumatic stress disorder) - 12/05/2015    FAMILY HISTORY: hyperlipidemia - Runs In Family Prostate Cancer - Runs In Family sudden death - Runs In Family Varicose veins of legs - Runs In Family   SOCIAL HISTORY: Marital Status: Married Current Smoking Status: Patient has never smoked.  Social Drinker.  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     Notes: Occupation, Married, Alcohol use, Never a smoker   REVIEW OF SYSTEMS:    GU Review Male:   Patient denies frequent urination, hard to postpone urination, burning/ pain with urination, get up at night to urinate, leakage of urine, stream starts and stops, trouble starting your streams, and have to strain to urinate .  Gastrointestinal (Upper):   Patient denies nausea and vomiting.  Gastrointestinal (Lower):   Patient denies diarrhea and constipation.  Constitutional:   Patient denies fever, night sweats, weight loss, and  fatigue.  Skin:   Patient denies skin rash/ lesion and itching.  Eyes:   Patient denies blurred vision and double vision.  Ears/ Nose/ Throat:   Patient denies sore throat and sinus problems.  Hematologic/Lymphatic:   Patient denies swollen glands and easy bruising.  Cardiovascular:   Patient denies leg swelling and chest pains.   Respiratory:   Patient denies cough and shortness of breath.  Endocrine:   Patient denies excessive thirst.  Musculoskeletal:   Patient denies back pain and joint pain.  Neurological:   Patient denies headaches and dizziness.  Psychologic:   Patient denies depression and anxiety.   VITAL SIGNS:      06/16/2016 03:56 PM  Weight 211 lb / 95.71 kg  Height 74 in / 187.96 cm  BP 166/90 mmHg  Pulse 68 /min  BMI 27.1 kg/m   MULTI-SYSTEM PHYSICAL EXAMINATION:    Constitutional: Well-nourished. No physical deformities. Normally developed. Good grooming.  Neck: Neck symmetrical, not swollen. Normal tracheal position.  Respiratory: No labored breathing, no use of accessory muscles. Clear bilaterally.  Cardiovascular: Normal temperature, normal extremity pulses, no swelling, no varicosities. Regular rate and rhythm.  Lymphatic: No enlargement of neck, axillae, groin.  Skin: No paleness, no jaundice, no cyanosis. No lesion, no ulcer, no rash.  Neurologic / Psychiatric: Oriented to time, oriented to place, oriented to person. No depression, no anxiety, no agitation.  Gastrointestinal: No mass, no tenderness, no rigidity, non obese abdomen.  Eyes: Normal conjunctivae. Normal eyelids.  Ears, Nose, Mouth, and Throat: Left ear no scars, no lesions, no masses. Right ear no scars, no lesions, no masses. Nose no scars, no lesions, no masses. Normal hearing. Normal lips.  Musculoskeletal: Normal gait and station of head and neck.     PAST DATA REVIEWED:  Source Of History:  Patient  Lab Test Review:   PSA  Records Review:   Pathology Reports, Previous Patient Records  Urine Test Review:   Urinalysis   04/02/16  PSA  Total PSA 5.06     PROCEDURES:          Urinalysis - 81003         Urinalysis w/Scope - 81001    ASSESSMENT:      ICD-10 Details  1 GU:   Prostate Cancer - C61    PLAN:           Schedule Return Visit: Keep Scheduled Appointment  Return Visit: Next Available  Appointment - PT/OT Referral          Document Letter(s):  Created for Patient: Clinical Summary         Notes:   1. Prostate cancer: Austin Houston has elected to proceed with surgical treatment of his low risk prostate cancer. We again reviewed the potential impact with regard to urinary function and erectile function and the expected recovery process associated with this procedure. We discussed surgical therapy for prostate cancer including the different available surgical approaches. We discussed, in detail, the risks and expectations of surgery with regard to cancer control, urinary control, and erectile function as well as the expected postoperative recovery process. Additional risks of surgery including but not limited to bleeding, infection, hernia formation, nerve damage, lymphocele formation, bowel/rectal injury potentially necessitating colostomy, damage to the urinary tract resulting in urine leakage, urethral stricture, and the cardiopulmonary risks such as myocardial infarction, stroke, death, venothromboembolism, etc. were explained. The risk of open surgical conversion for robotic/laparoscopic prostatectomy was also discussed.   He is scheduled to proceed with  a bilateral nerve sparing robot-assisted laparoscopic radical prostatectomy. He did request to have no resident involved with his care He also requested Exparel for postoperative pain management.   Cc: Dr. Frederico Hamman Copland      E & M CODE: I spent at least 40 minutes face to face with the patient, more than 50% of that time was spent on counseling and/or coordinating care.     * Signed by Raynelle Bring, M.D. on 06/25/16 at 2:25 PM (EST)*

## 2016-07-22 NOTE — Anesthesia Preprocedure Evaluation (Addendum)
Anesthesia Evaluation  Patient identified by MRN, date of birth, ID band Patient awake    Reviewed: Allergy & Precautions, H&P , Patient's Chart, lab work & pertinent test results, reviewed documented beta blocker date and time   Airway Mallampati: II  TM Distance: >3 FB Neck ROM: full    Dental no notable dental hx.    Pulmonary    Pulmonary exam normal breath sounds clear to auscultation       Cardiovascular hypertension,  Rhythm:regular Rate:Normal     Neuro/Psych    GI/Hepatic   Endo/Other    Renal/GU      Musculoskeletal   Abdominal   Peds  Hematology   Anesthesia Other Findings GERD (gastroesophageal reflux disease)   Allergic rhinitis due to pollen   Hypertension   Hyperlipidemia   PTSD (post-traumatic stress disorder)  s/p multiple active deployments for [employer], no medications taken currently Allergy   COPD (chronic obstructive pulmonary disease) (HCC)  vs asthma Complication of anesthesia  Hypertension only not malignant in nature in teenage yrs after surgery. Asthma  vs COPD- taking inhalers- as related to allergies only. 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)-surgery only Prostate cancer ([place]) 01/08/2016        Reproductive/Obstetrics                            Anesthesia Physical Anesthesia Plan  ASA: II  Anesthesia Plan: General   Post-op Pain Management:    Induction: Intravenous  Airway Management Planned: Oral ETT  Additional Equipment:   Intra-op Plan:   Post-operative Plan: Extubation in OR  Informed Consent: I have reviewed the patients History and Physical, chart, labs and discussed the procedure including the risks, benefits and alternatives for the proposed anesthesia with the patient or authorized representative who has indicated his/her understanding and acceptance.   Dental Advisory Given and Dental  advisory given  Plan Discussed with: CRNA and Surgeon  Anesthesia Plan Comments: (  Discussed general anesthesia, including possible nausea, instrumentation of airway, sore throat,pulmonary aspiration, etc. I asked if the were any outstanding questions, or  concerns before we proceeded.)        Anesthesia Quick Evaluation

## 2016-07-23 ENCOUNTER — Inpatient Hospital Stay (HOSPITAL_COMMUNITY)
Admission: RE | Admit: 2016-07-23 | Discharge: 2016-07-24 | DRG: 708 | Disposition: A | Discharge status: Home / Self Care | Payer: 59 | Source: Ambulatory Visit | Attending: Urology | Admitting: Urology

## 2016-07-23 ENCOUNTER — Inpatient Hospital Stay (HOSPITAL_COMMUNITY): Payer: 59 | Admitting: Anesthesiology

## 2016-07-23 ENCOUNTER — Encounter (HOSPITAL_COMMUNITY): Payer: Self-pay | Admitting: *Deleted

## 2016-07-23 ENCOUNTER — Encounter (HOSPITAL_COMMUNITY)
Admission: RE | Disposition: A | Discharge status: Home / Self Care | Payer: Self-pay | Source: Ambulatory Visit | Attending: Urology

## 2016-07-23 DIAGNOSIS — Z888 Allergy status to other drugs, medicaments and biological substances status: Secondary | ICD-10-CM | POA: Diagnosis not present

## 2016-07-23 DIAGNOSIS — J45909 Unspecified asthma, uncomplicated: Secondary | ICD-10-CM | POA: Diagnosis present

## 2016-07-23 DIAGNOSIS — Z8042 Family history of malignant neoplasm of prostate: Secondary | ICD-10-CM | POA: Diagnosis not present

## 2016-07-23 DIAGNOSIS — K219 Gastro-esophageal reflux disease without esophagitis: Secondary | ICD-10-CM | POA: Diagnosis present

## 2016-07-23 DIAGNOSIS — Z79899 Other long term (current) drug therapy: Secondary | ICD-10-CM | POA: Diagnosis not present

## 2016-07-23 DIAGNOSIS — F4310 Post-traumatic stress disorder, unspecified: Secondary | ICD-10-CM | POA: Diagnosis present

## 2016-07-23 DIAGNOSIS — E785 Hyperlipidemia, unspecified: Secondary | ICD-10-CM | POA: Diagnosis present

## 2016-07-23 DIAGNOSIS — Z85850 Personal history of malignant neoplasm of thyroid: Secondary | ICD-10-CM | POA: Diagnosis not present

## 2016-07-23 DIAGNOSIS — C61 Malignant neoplasm of prostate: Secondary | ICD-10-CM | POA: Diagnosis not present

## 2016-07-23 DIAGNOSIS — I10 Essential (primary) hypertension: Secondary | ICD-10-CM | POA: Diagnosis present

## 2016-07-23 DIAGNOSIS — Z7982 Long term (current) use of aspirin: Secondary | ICD-10-CM

## 2016-07-23 HISTORY — PX: ROBOT ASSISTED LAPAROSCOPIC RADICAL PROSTATECTOMY: SHX5141

## 2016-07-23 LAB — TYPE AND SCREEN
ABO/RH(D): O POS
ANTIBODY SCREEN: NEGATIVE

## 2016-07-23 LAB — HEMOGLOBIN AND HEMATOCRIT, BLOOD
HCT: 40 % (ref 39.0–52.0)
Hemoglobin: 13.9 g/dL (ref 13.0–17.0)

## 2016-07-23 SURGERY — XI ROBOTIC ASSISTED LAPAROSCOPIC RADICAL PROSTATECTOMY LEVEL 1
Anesthesia: General

## 2016-07-23 MED ORDER — ONDANSETRON HCL 4 MG/2ML IJ SOLN: 4.0000 mg | Freq: Once | INTRAMUSCULAR | Status: DC | PRN

## 2016-07-23 MED ORDER — DIPHENHYDRAMINE HCL 12.5 MG/5ML PO ELIX: 12.5000 mg | ORAL_SOLUTION | Freq: Four times a day (QID) | ORAL | Status: DC | PRN

## 2016-07-23 MED ORDER — KCL IN DEXTROSE-NACL 20-5-0.45 MEQ/L-%-% IV SOLN
INTRAVENOUS | Status: DC
  Administered 2016-07-23 – 2016-07-24 (×3): via INTRAVENOUS
  Filled 2016-07-23 (×3): qty 1000

## 2016-07-23 MED ORDER — HEPARIN SODIUM (PORCINE) 1000 UNIT/ML IJ SOLN
INTRAMUSCULAR | Status: AC
  Filled 2016-07-23: qty 1

## 2016-07-23 MED ORDER — BUPIVACAINE LIPOSOME 1.3 % IJ SUSP
INTRAMUSCULAR | Status: DC | PRN
  Administered 2016-07-23: 20 mL

## 2016-07-23 MED ORDER — ONDANSETRON HCL 4 MG/2ML IJ SOLN
INTRAMUSCULAR | Status: AC
  Filled 2016-07-23: qty 2

## 2016-07-23 MED ORDER — BUPIVACAINE LIPOSOME 1.3 % IJ SUSP
INTRAMUSCULAR | Status: AC
  Filled 2016-07-23: qty 20

## 2016-07-23 MED ORDER — MIDAZOLAM HCL 2 MG/2ML IJ SOLN
INTRAMUSCULAR | Status: DC | PRN
  Administered 2016-07-23: 2 mg via INTRAVENOUS

## 2016-07-23 MED ORDER — LACTATED RINGERS IV SOLN
INTRAVENOUS | Status: DC | PRN
  Administered 2016-07-23: 1000 mL

## 2016-07-23 MED ORDER — CEFAZOLIN SODIUM-DEXTROSE 2-4 GM/100ML-% IV SOLN
INTRAVENOUS | Status: AC
  Filled 2016-07-23: qty 100

## 2016-07-23 MED ORDER — ACETAMINOPHEN 10 MG/ML IV SOLN
INTRAVENOUS | Status: AC
  Filled 2016-07-23: qty 100

## 2016-07-23 MED ORDER — FENTANYL CITRATE (PF) 250 MCG/5ML IJ SOLN
INTRAMUSCULAR | Status: DC | PRN
  Administered 2016-07-23 (×7): 50 ug via INTRAVENOUS

## 2016-07-23 MED ORDER — HYDROCODONE-ACETAMINOPHEN 5-325 MG PO TABS: 1.0000 | ORAL_TABLET | Freq: Four times a day (QID) | ORAL | 0 refills | Status: DC | PRN

## 2016-07-23 MED ORDER — ACETAMINOPHEN 10 MG/ML IV SOLN
1000.0000 mg | Freq: Once | INTRAVENOUS | Status: AC
  Administered 2016-07-23: 1000 mg via INTRAVENOUS

## 2016-07-23 MED ORDER — ROCURONIUM BROMIDE 50 MG/5ML IV SOSY
PREFILLED_SYRINGE | INTRAVENOUS | Status: AC
  Filled 2016-07-23: qty 5

## 2016-07-23 MED ORDER — SODIUM CHLORIDE 0.9 % IR SOLN
Status: DC | PRN
  Administered 2016-07-23: 1000 mL via INTRAVESICAL

## 2016-07-23 MED ORDER — SUGAMMADEX SODIUM 200 MG/2ML IV SOLN
INTRAVENOUS | Status: AC
  Filled 2016-07-23: qty 2

## 2016-07-23 MED ORDER — LIDOCAINE 2% (20 MG/ML) 5 ML SYRINGE
INTRAMUSCULAR | Status: DC | PRN
  Administered 2016-07-23: 80 mg via INTRAVENOUS

## 2016-07-23 MED ORDER — SULFAMETHOXAZOLE-TRIMETHOPRIM 800-160 MG PO TABS: 1.0000 | ORAL_TABLET | Freq: Two times a day (BID) | ORAL | 0 refills | Status: DC

## 2016-07-23 MED ORDER — PROPOFOL 10 MG/ML IV BOLUS
INTRAVENOUS | Status: AC
  Filled 2016-07-23: qty 20

## 2016-07-23 MED ORDER — DEXAMETHASONE SODIUM PHOSPHATE 10 MG/ML IJ SOLN
INTRAMUSCULAR | Status: DC | PRN
  Administered 2016-07-23: 10 mg via INTRAVENOUS

## 2016-07-23 MED ORDER — DEXAMETHASONE SODIUM PHOSPHATE 10 MG/ML IJ SOLN
INTRAMUSCULAR | Status: AC
  Filled 2016-07-23: qty 1

## 2016-07-23 MED ORDER — HYDROMORPHONE HCL 1 MG/ML IJ SOLN
INTRAMUSCULAR | Status: AC
  Filled 2016-07-23: qty 1

## 2016-07-23 MED ORDER — FENTANYL CITRATE (PF) 250 MCG/5ML IJ SOLN
INTRAMUSCULAR | Status: AC
  Filled 2016-07-23: qty 5

## 2016-07-23 MED ORDER — EPHEDRINE SULFATE 50 MG/ML IJ SOLN
INTRAMUSCULAR | Status: DC | PRN
  Administered 2016-07-23: 15 mg via INTRAVENOUS

## 2016-07-23 MED ORDER — SUCCINYLCHOLINE CHLORIDE 200 MG/10ML IV SOSY
PREFILLED_SYRINGE | INTRAVENOUS | Status: AC
  Filled 2016-07-23: qty 10

## 2016-07-23 MED ORDER — CEFAZOLIN IN D5W 1 GM/50ML IV SOLN
1.0000 g | Freq: Three times a day (TID) | INTRAVENOUS | Status: AC
  Administered 2016-07-23 – 2016-07-24 (×2): 1 g via INTRAVENOUS
  Filled 2016-07-23 (×3): qty 50

## 2016-07-23 MED ORDER — PROPOFOL 10 MG/ML IV BOLUS
INTRAVENOUS | Status: DC | PRN
  Administered 2016-07-23: 200 mg via INTRAVENOUS

## 2016-07-23 MED ORDER — FENTANYL CITRATE (PF) 100 MCG/2ML IJ SOLN
INTRAMUSCULAR | Status: AC
  Filled 2016-07-23: qty 2

## 2016-07-23 MED ORDER — AMLODIPINE BESYLATE 10 MG PO TABS
10.0000 mg | ORAL_TABLET | Freq: Every day | ORAL | Status: DC
Start: 2016-07-24 — End: 2016-07-24
  Administered 2016-07-24: 10 mg via ORAL
  Filled 2016-07-23: qty 1

## 2016-07-23 MED ORDER — PANTOPRAZOLE SODIUM 40 MG PO TBEC
40.0000 mg | DELAYED_RELEASE_TABLET | Freq: Every day | ORAL | Status: DC
  Administered 2016-07-24: 40 mg via ORAL
  Filled 2016-07-23: qty 1

## 2016-07-23 MED ORDER — DOCUSATE SODIUM 100 MG PO CAPS
100.0000 mg | ORAL_CAPSULE | Freq: Two times a day (BID) | ORAL | Status: DC
  Administered 2016-07-23 – 2016-07-24 (×2): 100 mg via ORAL
  Filled 2016-07-23 (×2): qty 1

## 2016-07-23 MED ORDER — BACITRACIN-NEOMYCIN-POLYMYXIN 400-5-5000 EX OINT: TOPICAL_OINTMENT | CUTANEOUS | Status: DC | PRN

## 2016-07-23 MED ORDER — ONDANSETRON HCL 4 MG/2ML IJ SOLN
INTRAMUSCULAR | Status: DC | PRN
  Administered 2016-07-23: 4 mg via INTRAVENOUS

## 2016-07-23 MED ORDER — SODIUM CHLORIDE 0.9 % IJ SOLN
INTRAMUSCULAR | Status: AC
  Filled 2016-07-23: qty 50

## 2016-07-23 MED ORDER — DIPHENHYDRAMINE HCL 50 MG/ML IJ SOLN: 12.5000 mg | Freq: Four times a day (QID) | INTRAMUSCULAR | Status: DC | PRN

## 2016-07-23 MED ORDER — TRIAMCINOLONE ACETONIDE 55 MCG/ACT NA AERO
1.0000 | INHALATION_SPRAY | Freq: Every day | NASAL | Status: DC | PRN
  Filled 2016-07-23: qty 10.8

## 2016-07-23 MED ORDER — MORPHINE SULFATE (PF) 10 MG/ML IV SOLN: 2.0000 mg | INTRAVENOUS | Status: DC | PRN

## 2016-07-23 MED ORDER — LIDOCAINE 2% (20 MG/ML) 5 ML SYRINGE
INTRAMUSCULAR | Status: AC
  Filled 2016-07-23: qty 5

## 2016-07-23 MED ORDER — MIDAZOLAM HCL 2 MG/2ML IJ SOLN
INTRAMUSCULAR | Status: AC
  Filled 2016-07-23: qty 2

## 2016-07-23 MED ORDER — FENTANYL CITRATE (PF) 100 MCG/2ML IJ SOLN: 25.0000 ug | INTRAMUSCULAR | Status: DC | PRN

## 2016-07-23 MED ORDER — SODIUM CHLORIDE 0.9 % IV BOLUS (SEPSIS)
1000.0000 mL | Freq: Once | INTRAVENOUS | Status: AC
  Administered 2016-07-23: 1000 mL via INTRAVENOUS

## 2016-07-23 MED ORDER — MORPHINE SULFATE (PF) 2 MG/ML IV SOLN
2.0000 mg | INTRAVENOUS | Status: DC | PRN
  Administered 2016-07-23 – 2016-07-24 (×4): 2 mg via INTRAVENOUS
  Administered 2016-07-24: 4 mg via INTRAVENOUS
  Filled 2016-07-23: qty 1
  Filled 2016-07-23: qty 2
  Filled 2016-07-23 (×3): qty 1

## 2016-07-23 MED ORDER — CEFAZOLIN SODIUM-DEXTROSE 2-4 GM/100ML-% IV SOLN
2.0000 g | INTRAVENOUS | Status: AC
  Administered 2016-07-23: 2 g via INTRAVENOUS

## 2016-07-23 MED ORDER — LORATADINE 10 MG PO TABS
10.0000 mg | ORAL_TABLET | Freq: Every day | ORAL | Status: DC
  Administered 2016-07-24: 10 mg via ORAL
  Filled 2016-07-23: qty 1

## 2016-07-23 MED ORDER — TRIAMCINOLONE ACETONIDE 55 MCG/ACT NA AERO: 1.0000 | INHALATION_SPRAY | Freq: Every day | NASAL | Status: DC | PRN

## 2016-07-23 MED ORDER — ACETAMINOPHEN 325 MG PO TABS: 650.0000 mg | ORAL_TABLET | ORAL | Status: DC | PRN

## 2016-07-23 MED ORDER — SUGAMMADEX SODIUM 200 MG/2ML IV SOLN
INTRAVENOUS | Status: DC | PRN
  Administered 2016-07-23: 200 mg via INTRAVENOUS

## 2016-07-23 MED ORDER — EPHEDRINE 5 MG/ML INJ
INTRAVENOUS | Status: AC
  Filled 2016-07-23: qty 10

## 2016-07-23 MED ORDER — HYDROMORPHONE HCL 1 MG/ML IJ SOLN
0.2500 mg | INTRAMUSCULAR | Status: DC | PRN
  Administered 2016-07-23 (×2): 0.5 mg via INTRAVENOUS

## 2016-07-23 MED ORDER — LACTATED RINGERS IV SOLN
INTRAVENOUS | Status: DC | PRN
  Administered 2016-07-23 (×2): via INTRAVENOUS

## 2016-07-23 MED ORDER — ALBUTEROL SULFATE (2.5 MG/3ML) 0.083% IN NEBU: 2.5000 mg | INHALATION_SOLUTION | Freq: Two times a day (BID) | RESPIRATORY_TRACT | Status: DC | PRN

## 2016-07-23 MED ORDER — ALBUTEROL SULFATE HFA 108 (90 BASE) MCG/ACT IN AERS: 2.0000 | INHALATION_SPRAY | Freq: Two times a day (BID) | RESPIRATORY_TRACT | Status: DC | PRN

## 2016-07-23 MED ORDER — LACTATED RINGERS IV SOLN: INTRAVENOUS | Status: DC

## 2016-07-23 MED ORDER — FENTANYL CITRATE (PF) 100 MCG/2ML IJ SOLN
25.0000 ug | INTRAMUSCULAR | Status: DC | PRN
  Administered 2016-07-23 (×3): 50 ug via INTRAVENOUS

## 2016-07-23 MED ORDER — KETOROLAC TROMETHAMINE 15 MG/ML IJ SOLN
15.0000 mg | Freq: Four times a day (QID) | INTRAMUSCULAR | Status: DC
  Administered 2016-07-23 – 2016-07-24 (×4): 15 mg via INTRAVENOUS
  Filled 2016-07-23 (×4): qty 1

## 2016-07-23 MED ORDER — ROCURONIUM BROMIDE 50 MG/5ML IV SOSY
PREFILLED_SYRINGE | INTRAVENOUS | Status: DC | PRN
  Administered 2016-07-23 (×3): 10 mg via INTRAVENOUS
  Administered 2016-07-23 (×2): 20 mg via INTRAVENOUS
  Administered 2016-07-23: 50 mg via INTRAVENOUS
  Administered 2016-07-23: 10 mg via INTRAVENOUS

## 2016-07-23 SURGICAL SUPPLY — 53 items
APPLICATOR COTTON TIP 6IN STRL (MISCELLANEOUS) ×2 IMPLANT
CATH FOLEY 2WAY SLVR 18FR 30CC (CATHETERS) ×2 IMPLANT
CATH ROBINSON RED A/P 16FR (CATHETERS) ×2 IMPLANT
CATH ROBINSON RED A/P 8FR (CATHETERS) ×2 IMPLANT
CATH TIEMANN FOLEY 18FR 5CC (CATHETERS) ×2 IMPLANT
CHLORAPREP W/TINT 26ML (MISCELLANEOUS) ×2 IMPLANT
CLIP LIGATING HEM O LOK PURPLE (MISCELLANEOUS) ×2 IMPLANT
COVER SURGICAL LIGHT HANDLE (MISCELLANEOUS) ×2 IMPLANT
COVER TIP SHEARS 8 DVNC (MISCELLANEOUS) ×1 IMPLANT
COVER TIP SHEARS 8MM DA VINCI (MISCELLANEOUS) ×1
CUTTER ECHEON FLEX ENDO 45 340 (ENDOMECHANICALS) ×2 IMPLANT
DECANTER SPIKE VIAL GLASS SM (MISCELLANEOUS) IMPLANT
DERMABOND ADVANCED (GAUZE/BANDAGES/DRESSINGS)
DERMABOND ADVANCED .7 DNX12 (GAUZE/BANDAGES/DRESSINGS) IMPLANT
DRAPE ARM DVNC X/XI (DISPOSABLE) ×4 IMPLANT
DRAPE COLUMN DVNC XI (DISPOSABLE) ×1 IMPLANT
DRAPE DA VINCI XI ARM (DISPOSABLE) ×4
DRAPE DA VINCI XI COLUMN (DISPOSABLE) ×1
DRAPE SURG IRRIG POUCH 19X23 (DRAPES) ×2 IMPLANT
DRSG TEGADERM 4X4.75 (GAUZE/BANDAGES/DRESSINGS) ×2 IMPLANT
ELECT REM PT RETURN 9FT ADLT (ELECTROSURGICAL) ×2
ELECTRODE REM PT RTRN 9FT ADLT (ELECTROSURGICAL) ×1 IMPLANT
GLOVE BIO SURGEON STRL SZ 6.5 (GLOVE) ×2 IMPLANT
GLOVE BIOGEL M STRL SZ7.5 (GLOVE) ×4 IMPLANT
GOWN STRL REUS W/TWL LRG LVL3 (GOWN DISPOSABLE) ×6 IMPLANT
HEMOSTAT SURGICEL 2X3 (HEMOSTASIS) ×2 IMPLANT
HOLDER FOLEY CATH W/STRAP (MISCELLANEOUS) ×2 IMPLANT
IRRIG SUCT STRYKERFLOW 2 WTIP (MISCELLANEOUS) ×2
IRRIGATION SUCT STRKRFLW 2 WTP (MISCELLANEOUS) ×1 IMPLANT
IV LACTATED RINGERS 1000ML (IV SOLUTION) ×2 IMPLANT
NDL SAFETY ECLIPSE 18X1.5 (NEEDLE) IMPLANT
NEEDLE HYPO 18GX1.5 SHARP (NEEDLE)
PACK ROBOT UROLOGY CUSTOM (CUSTOM PROCEDURE TRAY) ×2 IMPLANT
RELOAD GREEN ECHELON 45 (STAPLE) ×2 IMPLANT
SEAL CANN UNIV 5-8 DVNC XI (MISCELLANEOUS) ×4 IMPLANT
SEAL XI 5MM-8MM UNIVERSAL (MISCELLANEOUS) ×4
SOLUTION ELECTROLUBE (MISCELLANEOUS) ×2 IMPLANT
SUT ETHILON 3 0 PS 1 (SUTURE) ×2 IMPLANT
SUT MNCRL 3 0 RB1 (SUTURE) ×1 IMPLANT
SUT MNCRL 3 0 VIOLET RB1 (SUTURE) ×1 IMPLANT
SUT MNCRL AB 4-0 PS2 18 (SUTURE) ×4 IMPLANT
SUT MONOCRYL 3 0 RB1 (SUTURE) ×2
SUT VIC AB 0 CT1 27 (SUTURE) ×1
SUT VIC AB 0 CT1 27XBRD ANTBC (SUTURE) ×1 IMPLANT
SUT VIC AB 0 UR5 27 (SUTURE) ×2 IMPLANT
SUT VIC AB 2-0 SH 27 (SUTURE) ×1
SUT VIC AB 2-0 SH 27X BRD (SUTURE) ×1 IMPLANT
SUT VICRYL 0 UR6 27IN ABS (SUTURE) ×4 IMPLANT
SYR 27GX1/2 1ML LL SAFETY (SYRINGE) ×2 IMPLANT
TOWEL OR 17X26 10 PK STRL BLUE (TOWEL DISPOSABLE) IMPLANT
TOWEL OR NON WOVEN STRL DISP B (DISPOSABLE) ×2 IMPLANT
WATER STERILE IRR 1000ML POUR (IV SOLUTION) ×2 IMPLANT
WATER STERILE IRR 1500ML POUR (IV SOLUTION) IMPLANT

## 2016-07-23 NOTE — Progress Notes (Signed)
Dr. Linna Caprice at Bedside to administer Precedex

## 2016-07-23 NOTE — Anesthesia Postprocedure Evaluation (Signed)
Anesthesia Post Note  Patient: CASETON HALABY  Procedure(s) Performed: Procedure(s) (LRB): XI ROBOTIC ASSISTED LAPAROSCOPIC RADICAL PROSTATECTOMY LEVEL 1 (N/A)  Patient location during evaluation: PACU Anesthesia Type: General Level of consciousness: awake, awake and alert and oriented Pain management: pain level controlled Vital Signs Assessment: post-procedure vital signs reviewed and stable Respiratory status: spontaneous breathing, nonlabored ventilation and respiratory function stable Cardiovascular status: blood pressure returned to baseline Anesthetic complications: no       Last Vitals:  Vitals:   07/23/16 1430 07/23/16 1509  BP: 109/66 135/79  Pulse: 69 77  Resp: (!) 21 15  Temp:  36.9 C    Last Pain:  Vitals:   07/23/16 1526  TempSrc:   PainSc: 1                  Ifeoma Vallin COKER

## 2016-07-23 NOTE — Progress Notes (Signed)
Anesthesiology Note:  80 mcg. precedex given in 10-15 mcg increments in PACU over 5 minutes for analgesia.  Roberts Gaudy

## 2016-07-23 NOTE — Interval H&P Note (Signed)
History and Physical Interval Note:  07/23/2016 6:55 AM  Austin Houston  has presented today for surgery, with the diagnosis of PROSTATE CANCER  The various methods of treatment have been discussed with the patient and family. After consideration of risks, benefits and other options for treatment, the patient has consented to  Procedure(s): XI ROBOTIC [place] 1 (N/A) as a surgical intervention .  The patient's history has been reviewed, patient examined, no change in status, stable for surgery.  I have reviewed the patient's chart and labs.  Questions were answered to the patient's satisfaction.     Brittanni Cariker,LES

## 2016-07-23 NOTE — Op Note (Signed)
Preoperative diagnosis: Clinically localized adenocarcinoma of the prostate (clinical stage T1c Nx Mx)  Postoperative diagnosis: Clinically localized adenocarcinoma of the prostate (clinical stage T1c Nx Mx)  Procedure:  1. Robotic assisted laparoscopic radical prostatectomy (bilateral nerve sparing)  Surgeon: Roxy Horseman, Brooke Bonito. M.D.  Assistant: Debbrah Alar, PA-C  An assistant was required for this surgical procedure.  The duties of the assistant included but were not limited to suctioning, passing suture, camera manipulation, retraction. This procedure would not be able to be performed without an Environmental consultant.  Anesthesia: General  Complications: None  EBL: 50 mL  IVF:  1400 mL crystalloid  Specimens: 1. Prostate and seminal vesicles  Disposition of specimens: Pathology  Drains: 1. 20 Fr coude catheter 2. # 19 Blake pelvic drain  Indication: Austin Houston is a 56 y.o. year old patient with clinically localized prostate cancer.  After a thorough review of the management options for treatment of prostate cancer, he elected to proceed with surgical therapy and the above procedure(s).  We have discussed the potential benefits and risks of the procedure, side effects of the proposed treatment, the likelihood of the patient achieving the goals of the procedure, and any potential problems that might occur during the procedure or recuperation. Informed consent has been obtained.  Description of procedure:  The patient was taken to the operating room and a general anesthetic was administered. He was given preoperative antibiotics, placed in the dorsal lithotomy position, and prepped and draped in the usual sterile fashion. Next a preoperative timeout was performed. A urethral catheter was placed into the bladder and a site was selected near the umbilicus for placement of the camera port. This was placed using a standard open Hassan technique which allowed entry into the peritoneal  cavity under direct vision and without difficulty. An 8 mm port was placed and a pneumoperitoneum established. The camera was then used to inspect the abdomen and there was no evidence of any intra-abdominal injuries or other abnormalities. The remaining abdominal ports were then placed. 8 mm robotic ports were placed in the right lower quadrant, left lower quadrant, and far left lateral abdominal wall. A 5 mm port was placed in the right upper quadrant and a 12 mm port was placed in the right lateral abdominal wall for laparoscopic assistance. All ports were placed under direct vision without difficulty. The surgical cart was then docked.   Utilizing the cautery scissors, the bladder was reflected posteriorly allowing entry into the space of Retzius and identification of the endopelvic fascia and prostate. The periprostatic fat was then removed from the prostate allowing full exposure of the endopelvic fascia. The endopelvic fascia was then incised from the apex back to the base of the prostate bilaterally and the underlying levator muscle fibers were swept laterally off the prostate thereby isolating the dorsal venous complex. The dorsal vein was then stapled and divided with a 45 mm Flex Echelon stapler. Attention then turned to the bladder neck which was divided anteriorly thereby allowing entry into the bladder and exposure of the urethral catheter. The catheter balloon was deflated and the catheter was brought into the operative field and used to retract the prostate anteriorly. The posterior bladder neck was then examined and was divided allowing further dissection between the bladder and prostate posteriorly until the vasa deferentia and seminal vessels were identified. The vasa deferentia were isolated, divided, and lifted anteriorly. The seminal vesicles were dissected down to their tips with care to control the seminal vascular arterial blood  supply. These structures were then lifted anteriorly and the  space between Denonvillier's fascia and the anterior rectum was developed with a combination of sharp and blunt dissection. This isolated the vascular pedicles of the prostate.  The lateral prostatic fascia was then sharply incised allowing release of the neurovascular bundles bilaterally. The vascular pedicles of the prostate were then ligated with Weck clips between the prostate and neurovascular bundles and divided with sharp cold scissor dissection resulting in neurovascular bundle preservation. The neurovascular bundles were then separated off the apex of the prostate and urethra bilaterally.  The urethra was then sharply transected allowing the prostate specimen to be disarticulated. The pelvis was copiously irrigated and hemostasis was ensured. There was no evidence for rectal injury.  Attention then turned to the urethral anastomosis. A 2-0 Vicryl slip knot was placed between Denonvillier's fascia, the posterior bladder neck, and the posterior urethra to reapproximate these structures. A double-armed 3-0 Monocryl suture was then used to perform a 360 running tension-free anastomosis between the bladder neck and urethra. A new urethral catheter was then placed into the bladder and irrigated. There were no blood clots within the bladder and the anastomosis appeared to be watertight. A #19 Blake drain was then brought through the left lateral 8 mm port site and positioned appropriately within the pelvis. It was secured to the skin with a nylon suture. The surgical cart was then undocked. The right lateral 12 mm port site was closed at the fascial level with a 0 Vicryl suture placed laparoscopically. All remaining ports were then removed under direct vision. The prostate specimen was removed intact within the Endopouch retrieval bag via the periumbilical camera port site. This fascial opening was closed with two running 0 Vicryl sutures. 0.25% Marcaine was then injected into all port sites and all  incisions were reapproximated at the skin level with 4-0 Monocryl subcuticular sutures and liquid skin adhesive. The patient appeared to tolerate the procedure well and without complications. The patient was able to be extubated and transferred to the recovery unit in satisfactory condition.  Pryor Curia MD

## 2016-07-23 NOTE — Transfer of Care (Signed)
Immediate Anesthesia Transfer of Care Note  Patient: Austin Houston  Procedure(s) Performed: Procedure(s): XI ROBOTIC ASSISTED LAPAROSCOPIC RADICAL PROSTATECTOMY LEVEL 1 (N/A)  Patient Location:2 PACU  Anesthesia Type:General  Level of Consciousness: awake, alert , oriented and patient cooperative  Airway & Oxygen Therapy: Patient Spontanous Breathing and Patient connected to face mask oxygen  Post-op Assessment: Report given to RN and Post -op Vital signs reviewed and stable  Post vital signs: Reviewed and stable  Last Vitals:  Vitals:   07/23/16 0558  BP: (!) 143/99  Pulse: 100  Resp: 16  Temp: 36.9 C    Last Pain:  Vitals:   07/23/16 0558  TempSrc: Oral      Patients Stated Pain Goal: 3 ([id number])  Complications: No apparent anesthesia complications

## 2016-07-23 NOTE — Anesthesia Procedure Notes (Signed)
Procedure Name: Intubation Date/Time: 07/23/2016 7:25 AM Performed by: Dione Booze Pre-anesthesia Checklist: Emergency Drugs available, Suction available, Patient being monitored and Patient identified Patient Re-evaluated:Patient Re-evaluated prior to inductionOxygen Delivery Method: Circle system utilized Preoxygenation: Pre-oxygenation with 100% oxygen Intubation Type: IV induction Ventilation: Two handed mask ventilation required and Oral airway inserted - appropriate to patient size Laryngoscope Size: Mac and 4 Grade View: Grade I Tube type: Oral Tube size: 7.5 mm Number of attempts: 1 Airway Equipment and Method: Stylet Placement Confirmation: ETT inserted through vocal cords under direct vision,  positive ETCO2 and breath sounds checked- equal and bilateral Secured at: 22 cm Tube secured with: Tape Dental Injury: Teeth and Oropharynx as per pre-operative assessment

## 2016-07-23 NOTE — Progress Notes (Signed)
Post-op note  Subjective: The patient is doing well.  No complaints.  Still in PACU.  Denies N/V  Objective: Vital signs in last 24 hours: Temp:  [97.1 F (36.2 C)-98.8 F (37.1 C)] 98.8 F (37.1 C) (01/11 1415) Pulse Rate:  [68-100] 69 (01/11 1430) Resp:  [11-21] 21 (01/11 1430) BP: (101-162)/(66-112) 109/66 (01/11 1430) SpO2:  [90 %-100 %] 100 % (01/11 1430) Weight:  [97.1 kg (214 lb)] 97.1 kg (214 lb) (01/11 0533)  Intake/Output from previous day: No intake/output data recorded. Intake/Output this shift: Total I/O In: 2100 [I.V.:2100] Out: 550 [Urine:500; Blood:50]  Physical Exam:  General: Alert and oriented. Abdomen: Soft, Nondistended. Incisions: Clean and dry. Urine: pink  Lab Results:  Recent Labs  07/23/16 1115  HGB 13.9  HCT 40.0    Assessment/Plan: POD#0   1) Continue to monitor  2) DVT prophy, clears, IS, amb, pain control   LOS: 0 days   Lachina Salsberry 07/23/2016, 2:52 PM

## 2016-07-23 NOTE — Progress Notes (Signed)
Transferring to Main PACU due to no bed available

## 2016-07-23 NOTE — Discharge Instructions (Signed)

## 2016-07-23 NOTE — Progress Notes (Signed)
Resumed care from Ollen Barges, RN

## 2016-07-24 LAB — HEMOGLOBIN AND HEMATOCRIT, BLOOD
HCT: 34.7 % — ABNORMAL LOW (ref 39.0–52.0)
Hemoglobin: 11.9 g/dL — ABNORMAL LOW (ref 13.0–17.0)

## 2016-07-24 MED ORDER — BISACODYL 10 MG RE SUPP
10.0000 mg | Freq: Once | RECTAL | Status: AC
  Administered 2016-07-24: 10 mg via RECTAL
  Filled 2016-07-24: qty 1

## 2016-07-24 MED ORDER — HYDROCODONE-ACETAMINOPHEN 5-325 MG PO TABS
1.0000 | ORAL_TABLET | Freq: Four times a day (QID) | ORAL | Status: DC | PRN
  Administered 2016-07-24: 2 via ORAL
  Filled 2016-07-24: qty 2

## 2016-07-24 NOTE — Care Management Note (Signed)
Case Management Note  Patient Details  Name: Austin Houston MRN: [id number] Date of Birth: 1961/05/20  Subjective/Objective:                    Action/Plan:d/c home.   Expected Discharge Date:  07/24/16               Expected Discharge Plan:  Home/Self Care  In-House Referral:     Discharge planning Services  CM Consult  Post Acute Care Choice:    Choice offered to:     DME Arranged:    DME Agency:     HH Arranged:    HH Agency:     Status of Service:  Completed, signed off  If discussed at [employer] of Stay Meetings, dates discussed:    Additional Comments:  Dessa Phi, RN 07/24/2016, 11:47 AM

## 2016-07-24 NOTE — Discharge Summary (Signed)
Date of admission: 07/23/2016  Date of discharge: 07/24/2016  Admission diagnosis: Prostate Cancer  Discharge diagnosis: Prostate Cancer  History and Physical: For full details, please see admission history and physical. Briefly, Austin Houston is a 56 y.o. gentleman with localized prostate cancer.  After discussing management/treatment options, he elected to proceed with surgical treatment.  Hospital Course: Austin Houston was taken to the operating room on 07/23/2016 and underwent a robotic assisted laparoscopic radical prostatectomy. He tolerated this procedure well and without complications. Postoperatively, he was able to be transferred to a regular hospital room following recovery from anesthesia.  He was able to begin ambulating the night of surgery. He remained hemodynamically stable overnight.  He had excellent urine output with appropriately minimal output from his pelvic drain and his pelvic drain was removed on POD #1.  He was transitioned to oral pain medication, tolerated a clear liquid diet, and had met all discharge criteria and was able to be discharged home later on POD#1.  Laboratory values:  Recent Labs  07/23/16 1115 07/24/16 0517  HGB 13.9 11.9*  HCT 40.0 34.7*    Disposition: Home  Discharge instruction: He was instructed to be ambulatory but to refrain from heavy lifting, strenuous activity, or driving. He was instructed on urethral catheter care.  Discharge medications:  Allergies as of 07/24/2016      Reactions   Vioxx [rofecoxib] Anaphylaxis   Tolerates aspirin and naproxen.   Celebrex [celecoxib] Rash   Tolerates aspirin and naproxen.   Codeine Rash   Doxycycline Rash      Medication List    STOP taking these medications   aspirin 81 MG tablet   cetirizine 10 MG tablet Commonly known as:  ZYRTEC   DAILY MENS HEALTH FORMULA Tabs   naproxen sodium 220 MG tablet Commonly known as:  ANAPROX   sildenafil 20 MG tablet Commonly known as:   REVATIO     TAKE these medications   albuterol 108 (90 Base) MCG/ACT inhaler Commonly known as:  VENTOLIN HFA INHALE 2 PUFFS INTO THE LUNGS EVERY 6 (SIX) HOURS AS NEEDED FOR WHEEZING. What changed:  how much to take  how to take this  when to take this  reasons to take this  additional instructions   amLODipine 10 MG tablet Commonly known as:  NORVASC Take 1 tablet (10 mg total) by mouth daily.   atorvastatin 40 MG tablet Commonly known as:  LIPITOR TAKE 1 TABLET BY MOUTH DAILY   HYDROcodone-acetaminophen 5-325 MG tablet Commonly known as:  NORCO Take 1-2 tablets by mouth every 6 (six) hours as needed for moderate pain or severe pain.   losartan 100 MG tablet Commonly known as:  COZAAR Take 1 tablet (100 mg total) by mouth daily.   LUBRICATING EYE DROPS OP Apply 1 drop to eye daily as needed (dry eyes).   NASACORT ALLERGY 24HR NA Place 1 spray into the nose daily as needed (allergies).   omeprazole 40 MG capsule Commonly known as:  PRILOSEC Take 1 capsule (40 mg total) by mouth daily.   ondansetron 4 MG tablet Commonly known as:  ZOFRAN Take 1 tablet (4 mg total) by mouth every 8 (eight) hours as needed for nausea or vomiting.   promethazine 25 MG suppository Commonly known as:  PHENERGAN Place 1 suppository (25 mg total) rectally every 6 (six) hours as needed for nausea or vomiting.   sulfamethoxazole-trimethoprim 800-160 MG tablet Commonly known as:  BACTRIM DS,SEPTRA DS Take 1 tablet by mouth 2 (  two) times daily. Start the day prior to foley removal appointment       Followup: He will followup in 1 week for catheter removal and to discuss his surgical pathology results.

## 2016-07-24 NOTE — Consult Note (Signed)
   [facility] CM Inpatient Consult   07/24/2016  LEVOY ISIDORE 12-08-1960 [id number]    Came to visit Mr. Steedley at bedside on behalf of Link to [facility] Care Management program for [facility] employees/dependents with [facility] insurance. Had a very pleasant bedside conversation with Mr. Metzker. Discussed Link to Wellness/[facility] program for HTN. He indicates he has already signed up for the [employer]. Confirmed best contact number for post discharge call as [phone]. Appreciative of visit. Contact information and 24-hr nurse line magnet provided.    Marthenia Rolling, MSN-Ed, RN,BSN [facility] Liaison [phone]

## 2016-07-24 NOTE — Progress Notes (Signed)
Patient ID: Austin Houston, male   DOB: [birth year], 56 y.o.   MRN: [id number]  1 Day Post-Op Subjective: The patient is doing well.  No nausea or vomiting. Pain is adequately controlled.  Objective: Vital signs in last 24 hours: Temp:  [97.1 F (36.2 C)-99.2 F (37.3 C)] 98.9 F (37.2 C) (01/12 0532) Pulse Rate:  [64-98] 68 (01/12 0532) Resp:  [11-21] 18 (01/12 0532) BP: (101-162)/(66-112) 128/80 (01/12 0532) SpO2:  [90 %-100 %] 97 % (01/12 0532)  Intake/Output from previous day: 01/11 0701 - 01/12 0700 In: 4167.5 [P.O.:240; I.V.:3827.5; IV Piggyback:100] Out: [id number] [Urine:3450; Drains:40; Blood:50] Intake/Output this shift: No intake/output data recorded.  Physical Exam:  General: Alert and oriented. CV: RRR Lungs: Clear bilaterally. GI: Soft, Nondistended. Incisions: Clean, dry, and intact Urine: Clear Extremities: Nontender, no erythema, no edema.  Lab Results:  Recent Labs  07/23/16 1115 07/24/16 0517  HGB 13.9 11.9*  HCT 40.0 34.7*      Assessment/Plan: POD# 1 s/p robotic prostatectomy.  1) SL IVF 2) Ambulate, Incentive spirometry 3) Transition to oral pain medication 4) Dulcolax suppository 5) D/C pelvic drain 6) Plan for likely discharge later today   Pryor Curia. MD   LOS: 1 day   Austin Houston,LES 07/24/2016, 7:10 AM

## 2016-07-27 ENCOUNTER — Encounter: Payer: Self-pay | Admitting: *Deleted

## 2016-07-27 ENCOUNTER — Other Ambulatory Visit: Payer: Self-pay | Admitting: *Deleted

## 2016-07-27 NOTE — Patient Outreach (Signed)
[facility]) Care Management  07/27/2016  Austin Houston August 29, 1960 [id number]   Subjective: Telephone call to patient's home / mobile number, spoke with patient, and HIPAA verified.  Discussed [facility] Care Management UMR Transition of care follow up, [facility], patient voiced understanding, and is in agreement to complete follow up. Patient states he is doing well, tolerating diet, tolerating activities of daily living with restrictions, having bowel movements without difficulty, making good progress,  and has enrolled in the [facility] program for hypertension.   States he has follow up appointment with surgeon on 07/29/16 and will email primary MD through [place] regarding follow up appointment.  Patient states he utilizes [facility] for medications.  States he has been approved for family medical leave act (FMLA) through [employer].   State he has purchased hospital indemnity supplemental insurance and will file a claim once need documentation obtained.  Patient given contact number for patient accounting ([id number])  to request itemized statement.   States he has already contacted [facility] benefit specialist, received contact numbers for short term disability provider, and supplemental insurance provider.   States he is very frustrated with short term disability provider Scientist, clinical (histocompatibility and immunogenetics)) regarding request for additional information that was already provided and request was only approved through 07/23/16 (date of his surgery).  States he has a copy of the original short term disability request, the documentation that was provided by Psychologist, sport and exercise, he has already faxed all requested information to [employer] Falling, and [employer] is still requesting additional information.   RNCM educated patient on strategies for short term disability and supplemental insurance claims follow up.  States is return to work date is 09/04/16.  Patient voices understanding, appreciative of information, and states he will  follow up to file claims.  Patient states he does not have any transition of care, care coordination, transportation, community resource, or pharmacy needs at this time.  Patient in agreement to receive [facility] Care Management information.   Objective: Per chart review, patient hospitalized 1/11/8 - 07/24/16 for prostate cancer.   Status post Robotic assisted laparoscopic radical prostatectomy (bilateral nerve sparing) on 07/23/16.     Assessment: Received UMR Transition of care referral on 07/24/16.  Transition of care follow up completed, no Telephonic care management needs, and will proceed with case closure.   Plan: RNCM will send patient successful outreach letter, [facility] pamphlet, and magnet. RNCM will send case closure due to follow up completed / no care management needs request to [facility] Management.    Myna Freimark H. Annia Friendly, BSN, [place] Management [facility] Telephonic CM Phone: [phone] Fax: [phone]

## 2016-07-31 ENCOUNTER — Ambulatory Visit: Payer: Self-pay | Admitting: Physician Assistant

## 2016-08-02 ENCOUNTER — Encounter: Payer: Self-pay | Admitting: Family Medicine

## 2016-08-18 DIAGNOSIS — M6281 Muscle weakness (generalized): Secondary | ICD-10-CM | POA: Diagnosis not present

## 2016-08-18 DIAGNOSIS — N393 Stress incontinence (female) (male): Secondary | ICD-10-CM | POA: Diagnosis not present

## 2016-08-18 DIAGNOSIS — M62838 Other muscle spasm: Secondary | ICD-10-CM | POA: Diagnosis not present

## 2016-08-24 DIAGNOSIS — N3000 Acute cystitis without hematuria: Secondary | ICD-10-CM | POA: Diagnosis not present

## 2016-08-24 MED FILL — SULFAMETHOXAZOLE/TMP DS TAB: 800-160 | 7 days supply | Qty: 14 | Fill #0

## 2016-08-27 ENCOUNTER — Telehealth: Payer: Self-pay | Admitting: *Deleted

## 2016-08-27 NOTE — Telephone Encounter (Signed)
Received fax from [place] requesting PA for Sildenafil 20 mg.  PA completed on CoverMyMeds.  PA will be faxed to [place].  Awaiting decision.

## 2016-09-04 NOTE — Telephone Encounter (Signed)
PA for Sildenafil was denied.  [facility] notified of denial via fax.

## 2016-09-09 DIAGNOSIS — M6281 Muscle weakness (generalized): Secondary | ICD-10-CM | POA: Diagnosis not present

## 2016-09-09 DIAGNOSIS — M62838 Other muscle spasm: Secondary | ICD-10-CM | POA: Diagnosis not present

## 2016-09-09 DIAGNOSIS — N393 Stress incontinence (female) (male): Secondary | ICD-10-CM | POA: Diagnosis not present

## 2016-10-13 DIAGNOSIS — C61 Malignant neoplasm of prostate: Secondary | ICD-10-CM | POA: Diagnosis not present

## 2016-10-21 ENCOUNTER — Ambulatory Visit (INDEPENDENT_AMBULATORY_CARE_PROVIDER_SITE_OTHER): Payer: 59 | Admitting: Family Medicine

## 2016-10-21 ENCOUNTER — Encounter: Payer: Self-pay | Admitting: Family Medicine

## 2016-10-21 ENCOUNTER — Other Ambulatory Visit: Payer: 59

## 2016-10-21 VITALS — BP 124/80 | HR 62 | Temp 98.3°F | Ht 72.0 in | Wt 212.0 lb

## 2016-10-21 DIAGNOSIS — Z0000 Encounter for general adult medical examination without abnormal findings: Secondary | ICD-10-CM

## 2016-10-21 DIAGNOSIS — R5383 Other fatigue: Secondary | ICD-10-CM

## 2016-10-21 DIAGNOSIS — E7800 Pure hypercholesterolemia, unspecified: Secondary | ICD-10-CM | POA: Diagnosis not present

## 2016-10-21 DIAGNOSIS — Z23 Encounter for immunization: Secondary | ICD-10-CM

## 2016-10-21 DIAGNOSIS — Z79899 Other long term (current) drug therapy: Secondary | ICD-10-CM | POA: Diagnosis not present

## 2016-10-21 MED ORDER — FORMOTEROL FUMARATE 12 MCG IN CAPS: 12.0000 ug | ORAL_CAPSULE | Freq: Two times a day (BID) | RESPIRATORY_TRACT | 3 refills | Status: DC

## 2016-10-21 MED ORDER — ONDANSETRON HCL 4 MG PO TABS: 4.0000 mg | ORAL_TABLET | Freq: Three times a day (TID) | ORAL | 2 refills | Status: DC | PRN

## 2016-10-21 MED ORDER — ALBUTEROL SULFATE HFA 108 (90 BASE) MCG/ACT IN AERS: INHALATION_SPRAY | RESPIRATORY_TRACT | 3 refills | Status: DC

## 2016-10-21 MED ORDER — AMLODIPINE BESYLATE 10 MG PO TABS: 10.0000 mg | ORAL_TABLET | Freq: Every day | ORAL | 3 refills | Status: DC

## 2016-10-21 MED ORDER — PROMETHAZINE HCL 25 MG RE SUPP: 25.0000 mg | Freq: Four times a day (QID) | RECTAL | 2 refills | Status: DC | PRN

## 2016-10-21 MED ORDER — LOSARTAN POTASSIUM 100 MG PO TABS: 100.0000 mg | ORAL_TABLET | Freq: Every day | ORAL | 3 refills | Status: DC

## 2016-10-21 NOTE — Progress Notes (Signed)
Dr. Frederico Hamman T. Tameca Jerez, MD, [facility] [place], [address] Phone: [phone] Fax: [phone]  10/21/2016  Patient: Austin Houston, MRN: [id number], DOB: May 25, 1961, 56 y.o.  Primary Physician:  Owens Loffler, MD   Chief Complaint  Patient presents with  . Annual Exam   Subjective:   Austin Houston is a 56 y.o. pleasant patient who presents with the following:  Preventative Health Maintenance Visit:  Health Maintenance Summary Reviewed and updated, unless pt declines services.  Tobacco History Reviewed. Alcohol: No concerns, no excessive use Exercise Habits: Some activity, rec at least 30 mins 5 times a week STD concerns: no risk or activity to increase risk Drug Use: None Encouraged self-testicular check  Prostate cancer this last year  Lost 20 pounds.   Enjoyed [facility].  Cough x 3 weeks.  Intermittent coughing for 3 weeks. He has been using his Foradil for one week, and he has also been using some intermittent albuterol.  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  Social research officer, government for many years.  Questions if ongoing pulmonary issues could be secondary to occupational exposure from above.  Health Maintenance  Topic Date Due  . INFLUENZA VACCINE  02/10/2017  . COLONOSCOPY  01/30/2019  . TETANUS/TDAP  10/22/2026  . Hepatitis C Screening  Completed  . HIV Screening  Addressed   Immunization History  Administered Date(s) Administered  . Influenza,inj,Quad PF,36+ Mos 04/13/2015  . Tdap 10/21/2016   Patient Active Problem List   Diagnosis Date Noted  . 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 Prostate cancer ([place]) 01/08/2016  . Family history of prostate cancer in father 06/19/2014  . Varicose veins of lower extremities with other complications 80/32/1224  . Former smokeless tobacco use 11/25/2012  . GERD  (gastroesophageal reflux disease)   . Allergic rhinitis due to pollen   . Hypertension   . Hyperlipidemia   . PTSD (post-traumatic stress disorder)    Past Medical History:  Diagnosis Date  . Allergic rhinitis due to pollen   . Asthma    vs COPD- taking inhalers- as related to allergies only.  . Complication of anesthesia    Hypertension only not malignant in nature in teenage yrs after surgery.  Marland Kitchen COPD (chronic obstructive pulmonary disease) (HCC)    vs asthma  . Follicular cancer of thyroid [facility])    s/p partial thyroidectomy (follicular adenoma)-surgery only  . Fracture    left arm age 50-splinted only  . GERD (gastroesophageal reflux disease)   . Hearing impaired person, bilateral    hearing aida bilateral"high frequency loss  . Hyperlipidemia   . Hypertension   . Prostate cancer ([place]) 01/08/2016  . PTSD (post-traumatic stress disorder)    s/p multiple active deployments for [employer], no medications taken currently   Past Surgical History:  Procedure Laterality Date  . COLONOSCOPY W/ POLYPECTOMY     age 31 "adenoma"  . INGUINAL HERNIA REPAIR     right  . ROBOT ASSISTED LAPAROSCOPIC RADICAL PROSTATECTOMY N/A 07/23/2016   Procedure: XI ROBOTIC ASSISTED LAPAROSCOPIC RADICAL PROSTATECTOMY LEVEL 1;  Surgeon: Raynelle Bring, MD;  Location: [facility];  Service: Urology;  Laterality: N/A;  . THYROIDECTOMY, PARTIAL     Tami Ribas [facility])   Social History   Social History  . Marital status: Married    Spouse name: N/A  . Number of children: N/A  .  Years of education: N/A   Occupational History  . 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 Smoking status: Never Smoker  . Smokeless tobacco: Former Systems developer    Types: Snuff    Quit date: 12/03/2012  . Alcohol use 6.0 oz/week    10 Cans of beer per week     Comment: moderate  . Drug use: No  . Sexual activity: Yes    Partners: Female   Other Topics Concern  . 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   Active duty and Magazine features editor   Family History  Problem Relation Age of Onset  . Heart disease Paternal Grandmother   . Prostate cancer Father     Recurrent x 3  . Hyperlipidemia Father   . Other Father     varicose veins  . Hyperlipidemia Mother   . Other Mother     varicose veins  . Hypertension Maternal Grandmother   . Hypertension Maternal Grandfather   . Sudden death Brother 72  . Colon cancer Neg Hx   . Esophageal cancer Neg Hx   . Rectal cancer Neg Hx   . Stomach cancer Neg Hx    Allergies  Allergen Reactions  . Vioxx [Rofecoxib] Anaphylaxis    Tolerates aspirin and naproxen.  . Celebrex [Celecoxib] Rash    Tolerates aspirin and naproxen.  . Codeine Rash  . Doxycycline Rash    Medication list has been reviewed and updated.   General: Denies fever, chills, sweats. No significant weight loss. Eyes: Denies blurring,significant itching ENT: Denies earache, sore throat, and hoarseness. Cardiovascular: Denies chest pains, palpitations, dyspnea on exertion Respiratory: Denies cough, dyspnea at rest,wheeezing Breast: no concerns about lumps GI: Denies nausea, vomiting, diarrhea, constipation, change in bowel habits, abdominal pain, melena, hematochezia GU: still with intermittent ED after prostatectomy. No significant leakage. Musculoskeletal: Denies back pain, joint pain Derm: Denies rash, itching Neuro: Denies  paresthesias, frequent falls, frequent headaches Psych: Denies depression, anxiety Endocrine: Denies cold intolerance, heat intolerance, polydipsia Heme: Denies enlarged lymph nodes Allergy: No hayfever  Objective:   BP 124/80   Pulse 62   Temp 98.3 F (36.8 C) (Oral)   Ht 6' (1.829 m)   Wt 212 lb (96.2 kg)   BMI 28.75 kg/m   No exam data present  GEN: well  developed, well nourished, no acute distress Eyes: conjunctiva and lids normal, PERRLA, EOMI ENT: TM clear, nares clear, oral exam WNL Neck: supple, no lymphadenopathy, no thyromegaly, no JVD Pulm: clear to auscultation and percussion, respiratory effort normal CV: regular rate and rhythm, S1-S2, no murmur, rub or gallop, no bruits, peripheral pulses normal and symmetric, no cyanosis, clubbing, edema or varicosities Chest: no scars, masses GI: soft, non-tender; no hepatosplenomegaly, masses; active bowel sounds all quadrants GU: no hernia, testicular mass, penile discharge, or prostate enlargement Lymph: no cervical, axillary or inguinal adenopathy MSK: gait normal, muscle tone and strength WNL, no joint swelling, effusions, discoloration, crepitus  SKIN: clear, good turgor, color WNL, no rashes, lesions, or ulcerations Neuro: normal mental status, normal strength, sensation, and motion Psych: alert; oriented to person, place and time, normally interactive and not anxious or depressed in appearance.  All labs reviewed  with patient.  Lipids:    Component Value Date/Time   CHOL 240 (H) 10/21/2016 1613   TRIG 126.0 10/21/2016 1613   HDL 67.40 10/21/2016 1613   VLDL 25.2 10/21/2016 1613   CHOLHDL 4 10/21/2016 1613   CBC: CBC Latest Ref Rng & Units 10/21/2016 07/24/2016 07/23/2016  WBC 4.0 - 10.5 K/uL 5.7 - -  Hemoglobin 13.0 - 17.0 g/dL 15.4 11.9(L) 13.9  Hematocrit 39.0 - 52.0 % 44.8 34.7(L) 40.0  Platelets 150.0 - 400.0 K/uL 315.0 - -    Basic Metabolic Panel:    Component Value Date/Time   NA 139 10/21/2016 1613   K 4.5 10/21/2016 1613   CL 101 10/21/2016 1613   CO2 28 10/21/2016 1613   BUN 15 10/21/2016 1613   CREATININE 1.13 10/21/2016 1613   GLUCOSE 77 10/21/2016 1613   CALCIUM 10.1 10/21/2016 1613   Hepatic Function Latest Ref Rng & Units 10/21/2016 09/25/2015 06/15/2014  Total Protein 6.0 - 8.3 g/dL 7.6 7.1 7.2  Albumin 3.5 - 5.2 g/dL 4.6 4.7 4.5  AST 0 - 37 U/L 36  85(H) 64(H)  ALT 0 - 53 U/L 46 97(H) 96(H)  Alk Phosphatase 39 - 117 U/L 52 54 55  Total Bilirubin 0.2 - 1.2 mg/dL 0.5 0.6 1.0  Bilirubin, Direct 0.0 - 0.3 mg/dL 0.2 0.2 -    Lab Results  Component Value Date   TSH 4.42 10/21/2016   Lab Results  Component Value Date   PSA 4.36 (H) 11/01/2015   PSA 4.67 (H) 09/25/2015   PSA 3.47 06/15/2014    Assessment and Plan:   Healthcare maintenance  Pure hypercholesterolemia - Plan: Lipid panel  Encounter for long-term (current) use of medications - Plan: Basic metabolic panel, CBC with Differential/Platelet, Hepatic function panel  Other fatigue - Plan: TSH  Need for prophylactic vaccination with combined diphtheria-tetanus-pertussis (DTP) vaccine - Plan: Tdap vaccine greater than or equal to 7yo IM   intervally, due to insurance, I converted the patient's Foradil to Serevent.  Overall doing well. Check baseline labs. Good progress on his weight loss.  Health Maintenance Exam: The patient's preventative maintenance and recommended screening tests for an annual wellness exam were reviewed in full today. Brought up to date unless services declined.  Counselled on the importance of diet, exercise, and its role in overall health and mortality. The patient's FH and SH was reviewed, including their home life, tobacco status, and drug and alcohol status.  Follow-up in 1 year for physical exam or additional follow-up below.  Follow-up: No Follow-up on file. Or follow-up in 1 year if not noted.  Meds ordered this encounter  Medications  . DISCONTD: formoterol (FORADIL) 12 MCG capsule for inhaler    Sig: Place 12 mcg into inhaler and inhale every 12 (twelve) hours.  . formoterol (FORADIL) 12 MCG capsule for inhaler    Sig: Place 1 capsule (12 mcg total) into inhaler and inhale every 12 (twelve) hours.    Dispense:  180 capsule    Refill:  3  . ondansetron (ZOFRAN) 4 MG tablet    Sig: Take 1 tablet (4 mg total) by mouth every 8  (eight) hours as needed for nausea or vomiting.    Dispense:  20 tablet    Refill:  2  . promethazine (PHENERGAN) 25 MG suppository    Sig: Place 1 suppository (25 mg total) rectally every 6 (six) hours as needed for nausea or vomiting.    Dispense:  12 each    Refill:  2  . albuterol (VENTOLIN HFA) 108 (90 Base) MCG/ACT inhaler    Sig: INHALE 2 PUFFS INTO THE LUNGS EVERY 6 (SIX) HOURS AS NEEDED FOR WHEEZING.    Dispense:  56 g    Refill:  3  . amLODipine (NORVASC) 10 MG tablet    Sig: Take 1 tablet (10 mg total) by mouth daily.    Dispense:  90 tablet    Refill:  3  . losartan (COZAAR) 100 MG tablet    Sig: Take 1 tablet (100 mg total) by mouth daily.    Dispense:  90 tablet    Refill:  3   Medications Discontinued During This Encounter  Medication Reason  . sulfamethoxazole-trimethoprim (BACTRIM DS,SEPTRA DS) 800-160 MG tablet Completed Course  . atorvastatin (LIPITOR) 40 MG tablet Completed Course  . formoterol (FORADIL) 12 MCG capsule for inhaler Reorder  . ondansetron (ZOFRAN) 4 MG tablet Reorder  . promethazine (PHENERGAN) 25 MG suppository Reorder  . albuterol (VENTOLIN HFA) 108 (90 Base) MCG/ACT inhaler Reorder  . amLODipine (NORVASC) 10 MG tablet Reorder  . losartan (COZAAR) 100 MG tablet Reorder   Orders Placed This Encounter  Procedures  . Tdap vaccine greater than or equal to 7yo IM  . Basic metabolic panel  . CBC with Differential/Platelet  . Hepatic function panel  . Lipid panel  . TSH    Signed,  Frederico Hamman T. Jamieson Lisa, MD   Allergies as of 10/21/2016      Reactions   Vioxx [rofecoxib] Anaphylaxis   Tolerates aspirin and naproxen.   Celebrex [celecoxib] Rash   Tolerates aspirin and naproxen.   Codeine Rash   Doxycycline Rash      Medication List       Accurate as of 10/21/16 11:59 PM. Always use your most recent med list.          albuterol 108 (90 Base) MCG/ACT inhaler Commonly known as:  VENTOLIN HFA INHALE 2 PUFFS INTO THE LUNGS EVERY 6  (SIX) HOURS AS NEEDED FOR WHEEZING.   amLODipine 10 MG tablet Commonly known as:  NORVASC Take 1 tablet (10 mg total) by mouth daily.   cetirizine 10 MG tablet Commonly known as:  ZYRTEC Take 10 mg by mouth daily.   formoterol 12 MCG capsule for inhaler Commonly known as:  FORADIL Place 1 capsule (12 mcg total) into inhaler and inhale every 12 (twelve) hours.   HYDROcodone-acetaminophen 5-325 MG tablet Commonly known as:  NORCO Take 1-2 tablets by mouth every 6 (six) hours as needed for moderate pain or severe pain.   losartan 100 MG tablet Commonly known as:  COZAAR Take 1 tablet (100 mg total) by mouth daily.   LUBRICATING EYE DROPS OP Apply 1 drop to eye daily as needed (dry eyes).   NASACORT ALLERGY 24HR NA Place 1 spray into the nose daily as needed (allergies).   omeprazole 40 MG capsule Commonly known as:  PRILOSEC Take 1 capsule (40 mg total) by mouth daily.   ondansetron 4 MG tablet Commonly known as:  ZOFRAN Take 1 tablet (4 mg total) by mouth every 8 (eight) hours as needed for nausea or vomiting.   promethazine 25 MG suppository Commonly known as:  PHENERGAN Place 1 suppository (25 mg total) rectally every 6 (six) hours as needed for nausea or vomiting.

## 2016-10-21 NOTE — Progress Notes (Signed)
Pre visit review using our clinic review tool, if applicable. No additional management support is needed unless otherwise documented below in the visit note. 

## 2016-10-22 LAB — BASIC METABOLIC PANEL
BUN: 15 mg/dL (ref 6–23)
CALCIUM: 10.1 mg/dL (ref 8.4–10.5)
CO2: 28 mEq/L (ref 19–32)
CREATININE: 1.13 mg/dL (ref 0.40–1.50)
Chloride: 101 mEq/L (ref 96–112)
GFR: 71.8 mL/min (ref >60.00)
GLUCOSE: 77 mg/dL (ref 70–99)
Potassium: 4.5 mEq/L (ref 3.5–5.1)
Sodium: 139 mEq/L (ref 135–145)

## 2016-10-22 LAB — CBC WITH DIFFERENTIAL/PLATELET
BASOS ABS: 0 10*3/uL (ref 0.0–0.1)
Basophils Relative: 0.4 % (ref 0.0–3.0)
EOS ABS: 0.3 10*3/uL (ref 0.0–0.7)
EOS PCT: 4.5 % (ref 0.0–5.0)
HCT: 44.8 % (ref 39.0–52.0)
Hemoglobin: 15.4 g/dL (ref 13.0–17.0)
LYMPHS ABS: 1.7 10*3/uL (ref 0.7–4.0)
Lymphocytes Relative: 29.5 % (ref 12.0–46.0)
MCHC: 34.4 g/dL (ref 30.0–36.0)
MCV: 95.6 fl (ref 78.0–100.0)
MONO ABS: 0.5 10*3/uL (ref 0.1–1.0)
Monocytes Relative: 8 % (ref 3.0–12.0)
Neutro Abs: 3.3 10*3/uL (ref 1.4–7.7)
Neutrophils Relative %: 57.6 % (ref 43.0–77.0)
Platelets: 315 10*3/uL (ref 150.0–400.0)
RBC: 4.69 Mil/uL (ref 4.22–5.81)
RDW: 12.2 % (ref 11.5–15.5)
WBC: 5.7 10*3/uL (ref 4.0–10.5)

## 2016-10-22 LAB — LIPID PANEL
CHOL/HDL RATIO: 4
CHOLESTEROL: 240 mg/dL — AB (ref 0–200)
HDL: 67.4 mg/dL (ref >39.00)
LDL CALC: 147 mg/dL — AB (ref 0–99)
NonHDL: 172.15
Triglycerides: 126 mg/dL (ref 0.0–149.0)
VLDL: 25.2 mg/dL (ref 0.0–40.0)

## 2016-10-22 LAB — TSH: TSH: 4.42 u[IU]/mL (ref 0.35–4.50)

## 2016-10-22 LAB — HEPATIC FUNCTION PANEL
ALT: 46 U/L (ref 0–53)
AST: 36 U/L (ref 0–37)
Albumin: 4.6 g/dL (ref 3.5–5.2)
Alkaline Phosphatase: 52 U/L (ref 39–117)
BILIRUBIN DIRECT: 0.2 mg/dL (ref 0.0–0.3)
BILIRUBIN TOTAL: 0.5 mg/dL (ref 0.2–1.2)
Total Protein: 7.6 g/dL (ref 6.0–8.3)

## 2016-10-28 ENCOUNTER — Encounter: Payer: Self-pay | Admitting: Family Medicine

## 2016-10-28 ENCOUNTER — Other Ambulatory Visit: Payer: Self-pay | Admitting: Family Medicine

## 2016-10-28 DIAGNOSIS — N393 Stress incontinence (female) (male): Secondary | ICD-10-CM | POA: Diagnosis not present

## 2016-10-28 DIAGNOSIS — C61 Malignant neoplasm of prostate: Secondary | ICD-10-CM | POA: Diagnosis not present

## 2016-10-28 DIAGNOSIS — N5201 Erectile dysfunction due to arterial insufficiency: Secondary | ICD-10-CM | POA: Diagnosis not present

## 2016-11-03 ENCOUNTER — Other Ambulatory Visit: Payer: Self-pay | Admitting: Family Medicine

## 2016-11-06 DIAGNOSIS — H02823 Cysts of right eye, unspecified eyelid: Secondary | ICD-10-CM | POA: Diagnosis not present

## 2016-12-02 ENCOUNTER — Other Ambulatory Visit: Payer: Self-pay | Admitting: Family Medicine

## 2016-12-02 ENCOUNTER — Telehealth: Payer: Self-pay | Admitting: Family Medicine

## 2016-12-02 ENCOUNTER — Encounter: Payer: Self-pay | Admitting: Family Medicine

## 2016-12-02 MED ORDER — FLUTICASONE-SALMETEROL 250-50 MCG/DOSE IN AEPB: 1.0000 | INHALATION_SPRAY | Freq: Two times a day (BID) | RESPIRATORY_TRACT | 3 refills | Status: DC

## 2016-12-02 NOTE — Telephone Encounter (Signed)
Have him d/c salmeterol or foradil - I think he has used both in th epsat.   Instead:  Advair 250/50 diskus, 1 puff bid, disp 1 diskus, 3 refill.  I would try this and if no improvement, then f/u

## 2016-12-02 NOTE — Telephone Encounter (Signed)
Left message for [place] to return my call.  Advair sent into [place].  MyChart message also sent to patient.

## 2016-12-30 ENCOUNTER — Encounter: Payer: Self-pay | Admitting: Pulmonary Disease

## 2016-12-30 ENCOUNTER — Ambulatory Visit (INDEPENDENT_AMBULATORY_CARE_PROVIDER_SITE_OTHER): Payer: 59 | Admitting: Pulmonary Disease

## 2016-12-30 VITALS — BP 128/72 | HR 68 | Ht 72.0 in | Wt 216.0 lb

## 2016-12-30 DIAGNOSIS — R05 Cough: Secondary | ICD-10-CM

## 2016-12-30 DIAGNOSIS — R0602 Shortness of breath: Secondary | ICD-10-CM | POA: Diagnosis not present

## 2016-12-30 DIAGNOSIS — R059 Cough, unspecified: Secondary | ICD-10-CM

## 2016-12-30 DIAGNOSIS — J4540 Moderate persistent asthma, uncomplicated: Secondary | ICD-10-CM | POA: Diagnosis not present

## 2016-12-30 LAB — NITRIC OXIDE: Nitric Oxide: 33

## 2016-12-30 MED ORDER — BECLOMETHASONE DIPROP HFA 80 MCG/ACT IN AERB: 2.0000 | INHALATION_SPRAY | Freq: Two times a day (BID) | RESPIRATORY_TRACT | 0 refills | Status: DC

## 2016-12-30 NOTE — Patient Instructions (Signed)
For your cough and shortness of breath: I believe that you have mild to moderate persistent asthma -Take Qvar 2 puffs twice a day -Use albuterol as needed for chest tightness or shortness of breath -We will check a pulmonary function test -We will check a high-resolution CT scan of the chest -We will check an exhaled nitric oxide testing  We will see you back in 3 months or sooner if needed

## 2016-12-30 NOTE — Progress Notes (Signed)
Subjective:    Patient ID: Austin Houston, male    DOB: 1961-05-16, 56 y.o.   MRN: [id number]  Synopsis: Referred in 2018 for evaluation of recurrent cough, wheezing and shortness of breath which occurred after his [place] with burn pit exposure.  HPI Chief Complaint  Patient presents with  . Advice Only    self-referral to establish care for chronic breathing issues- c/o chronic cough worse qhs, keeping pt from sleeping well    "Austin Houston" is here to establish care with me right now for some respiratory problems.  He sees Dr. Lorelei Houston for his PCP.  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  He reports that nearly 6 of his friends that he was deployed with also have respiratory problems.  He was in [place], [place], [place], [place] and worked at [employer] fairly frequently.  He started in [facility], was in [place], [place].  Flew [employer].  He noted cough fairly freuqntly while he was in the [employer] which would improve when he came home.  Childhood was normal, no respiratory problems, stayed very active.  Never smoked.    He ntoes 2 times per year with cough, wheezing, difficulty sleeping due ot "violent coughing spells".  He says that his rescue inhaler helps a little when he takes it.  He has tried several different treatment options including Delsym, but the only thing that has really been effective has been Advair.  He notes that he is keeping the family up due ot his coughing.  He tried using foradil along but this didn't help.  These episodes will last one month at a time.  Over the last 5 years the episodes have been happening more frequently, are more intense, and last longer.  They started around 2010 to 2011 and have worsned since then.  Pollen made it worse at first, but now they happen all the time and don't correllate with the season any more.  He had a radical prostatectomy in January and had no prostate problems afterwards.   However in March he had symptoms again and by April he ended back on foradil but this didn't work so he was started the [place].  He took it for 2-3 weeks.  He denies post nasal drip.  He says that moeprazole controls his GERD (no symptoms now).  The cough is worse with vigorous activity and lying flat.  The rescue inhaler helps it.  The cough is associated with wheezing and dyspnea but only when he has the attacks.    He has been in his house for 19 years, no mold, no new pets.    When the cough is worse he may get.   He exercises regularly.    While in the [employer] (retired 2011) he would see a Risk analyst as well as Austin Houston when he had flare ups of cough which would be treated with cough.     Past Medical History:  Diagnosis Date  . Allergic rhinitis due to pollen   . Asthma    vs COPD- taking inhalers- as related to allergies only.  . Complication of anesthesia    Hypertension only not malignant in nature in teenage yrs after surgery.  Marland Kitchen COPD (chronic obstructive pulmonary disease) (HCC)    vs asthma  . 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 Fracture    left arm age 44-splinted only  .  GERD (gastroesophageal reflux disease)   . Hearing impaired person, bilateral    hearing aida bilateral"high frequency loss  . Hyperlipidemia   . Hypertension   . Prostate cancer ([place]) 01/08/2016  . PTSD (post-traumatic stress disorder)    s/p multiple active deployments for [employer], no medications taken currently     Family History  Problem Relation Age of Onset  . Heart disease Paternal Grandmother   . Prostate cancer Father        Recurrent x 3  . Hyperlipidemia Father   . Other Father        varicose veins  . Hyperlipidemia Mother   . Other Mother        varicose veins  . Hypertension Maternal Grandmother   . Hypertension Maternal Grandfather   . Sudden death Brother 80  . Colon cancer Neg Hx   . Esophageal  cancer Neg Hx   . Rectal cancer Neg Hx   . Stomach cancer Neg Hx      Social History   Social History  . Marital status: Married    Spouse name: N/A  . Number of children: N/A  . Years of education: N/A   Occupational History  . 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 Smoking status: Never Smoker  . Smokeless tobacco: Former Systems developer    Types: Snuff    Quit date: 12/03/2012  . Alcohol use 6.0 oz/week    10 Cans of beer per week     Comment: moderate  . Drug use: No  . Sexual activity: Yes    Partners: Female   Other Topics Concern  . 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   Active duty and Magazine features editor     Allergies  Allergen Reactions  . Vioxx [Rofecoxib] Anaphylaxis    Tolerates aspirin and naproxen.  . Celebrex [Celecoxib] Rash    Tolerates aspirin and naproxen.  . Codeine Rash  . Doxycycline Rash     Outpatient Medications Prior to Visit  Medication Sig Dispense Refill  . albuterol (VENTOLIN HFA) 108 (90 Base) MCG/ACT inhaler INHALE 2 PUFFS INTO THE LUNGS EVERY 6 (SIX) HOURS AS NEEDED FOR WHEEZING. 56 g 3  . amLODipine (NORVASC) 10 MG tablet Take 1 tablet (10 mg total) by mouth daily. 90 tablet 3  . Carboxymethylcellul-Glycerin (LUBRICATING EYE DROPS OP) Apply 1 drop to eye daily as needed (dry eyes).    . cetirizine (ZYRTEC) 10 MG tablet Take 10 mg by mouth daily.    Marland Kitchen HYDROcodone-acetaminophen (NORCO) 5-325 MG tablet Take 1-2 tablets by mouth every 6 (six) hours as needed for moderate pain or severe pain. 30 tablet 0  . losartan (COZAAR) 100 MG tablet Take 1 tablet (100 mg total) by mouth daily. 90 tablet 3  . omeprazole (PRILOSEC) 40 MG capsule TAKE 1 CAPSULE BY MOUTH DAILY. 90 capsule 3  . ondansetron (ZOFRAN) 4 MG tablet Take 1 tablet (4  mg total) by mouth every 8 (eight) hours as needed for nausea or vomiting. 20 tablet 2  . promethazine (PHENERGAN) 25 MG suppository Place 1 suppository (25 mg total) rectally every 6 (six) hours as needed for nausea or vomiting. 12 each 2  .  sildenafil (REVATIO) 20 MG tablet TAKE 2 TO 5 TABLETS BY MOUTH 30 MINS PRIOR INTERCOURSE 50 tablet 11  . Fluticasone-Salmeterol (ADVAIR DISKUS) 250-50 MCG/DOSE AEPB Inhale 1 puff into the lungs 2 (two) times daily. (Patient not taking: Reported on 12/30/2016) 1 each 3  . Triamcinolone Acetonide (NASACORT ALLERGY 24HR NA) Place 1 spray into the nose daily as needed (allergies).     No facility-administered medications prior to visit.       Review of Systems  Constitutional: Negative for fever and unexpected weight change.  HENT: Negative for congestion, dental problem, ear pain, nosebleeds, postnasal drip, rhinorrhea, sinus pressure, sneezing, sore throat and trouble swallowing.   Eyes: Negative for redness and itching.  Respiratory: Positive for cough, chest tightness, shortness of breath and wheezing.   Cardiovascular: Negative for palpitations and leg swelling.  Gastrointestinal: Negative for nausea and vomiting.  Genitourinary: Negative for dysuria.  Musculoskeletal: Negative for joint swelling.  Skin: Negative for rash.  Neurological: Negative for headaches.  Hematological: Does not bruise/bleed easily.  Psychiatric/Behavioral: Negative for dysphoric mood. The patient is not nervous/anxious.        Objective:   Physical Exam Vitals:   12/30/16 0902  BP: 128/72  Pulse: 68  SpO2: 97%  Weight: 216 lb (98 kg)  Height: 6' (1.829 m)   Gen: well appearing, no acute distress HENT: NCAT, OP clear, neck supple without masses Eyes: PERRL, EOMi Lymph: no cervical lymphadenopathy PULM: CTA B CV: RRR, no mgr, no JVD GI: BS+, soft, nontender, no hsm Derm: no rash or skin breakdown MSK: normal bulk and tone Neuro: A&Ox4, CN II-XII intact,  strength 5/5 in all 4 extremities Psyche: normal mood and affect   Chest imaging: February 2018 chest x-ray images and apparently reviewed showing slightly increased AP diameter normal cardiac silhouette normal pulmonary parenchyma otherwise  April 2018 primary care doctor notes reviewed were he was seen for a routine health maintenance visit wellness labs were collected. He was hospitalized in January 2018 for treatment of his prostate cancer    Assessment & Plan:  Cough - Plan: Pulmonary function test, CT Chest High Resolution, Nitric oxide  Shortness of breath - Plan: Pulmonary function test, CT Chest High Resolution, Nitric oxide  Moderate persistent asthma, unspecified whether complicated   Discussion: Mr. Melchior describes symptoms which are suggestive of mild to moderate persistent asthma with recurrent exacerbations throughout the course of the year. The differential diagnosis here includes small airways disease or bronchiolitis obliterans syndrome related to his burn pit exposure which I think is very likely considering the time course of his events. He had no asthma as a child and now he has symptoms which are very suggestive of this. The differential diagnosis includes eosinophilic bronchitis or some sort of other interstitial lung disease which hopefully is not present.  Plan:For your cough and shortness of breath: I believe that you have mild to moderate persistent asthma -Take Qvar 2 puffs twice a day -Use albuterol as needed for chest tightness or shortness of breath -We will check a pulmonary function test -We will check a high-resolution CT scan of the chest -We will check an exhaled nitric oxide testing  We will see you back in 3 months or sooner if needed  Current Outpatient Prescriptions:  .  albuterol (VENTOLIN HFA) 108 (90 Base) MCG/ACT inhaler, INHALE 2 PUFFS INTO THE LUNGS EVERY 6 (SIX) HOURS AS NEEDED FOR WHEEZING., Disp: 56 g, Rfl: 3 .  amLODipine (NORVASC) 10  MG tablet, Take 1  tablet (10 mg total) by mouth daily., Disp: 90 tablet, Rfl: 3 .  Carboxymethylcellul-Glycerin (LUBRICATING EYE DROPS OP), Apply 1 drop to eye daily as needed (dry eyes)., Disp: , Rfl:  .  cetirizine (ZYRTEC) 10 MG tablet, Take 10 mg by mouth daily., Disp: , Rfl:  .  fluticasone (FLONASE) 50 MCG/ACT nasal spray, Place 2 sprays into both nostrils daily., Disp: , Rfl:  .  HYDROcodone-acetaminophen (NORCO) 5-325 MG tablet, Take 1-2 tablets by mouth every 6 (six) hours as needed for moderate pain or severe pain., Disp: 30 tablet, Rfl: 0 .  losartan (COZAAR) 100 MG tablet, Take 1 tablet (100 mg total) by mouth daily., Disp: 90 tablet, Rfl: 3 .  Multiple Vitamin (MULTIVITAMIN WITH MINERALS) TABS tablet, Take 1 tablet by mouth daily., Disp: , Rfl:  .  naproxen (NAPROSYN) 250 MG tablet, Take 500 mg by mouth 2 (two) times daily with a meal., Disp: , Rfl:  .  omeprazole (PRILOSEC) 40 MG capsule, TAKE 1 CAPSULE BY MOUTH DAILY., Disp: 90 capsule, Rfl: 3 .  ondansetron (ZOFRAN) 4 MG tablet, Take 1 tablet (4 mg total) by mouth every 8 (eight) hours as needed for nausea or vomiting., Disp: 20 tablet, Rfl: 2 .  promethazine (PHENERGAN) 25 MG suppository, Place 1 suppository (25 mg total) rectally every 6 (six) hours as needed for nausea or vomiting., Disp: 12 each, Rfl: 2 .  sildenafil (REVATIO) 20 MG tablet, TAKE 2 TO 5 TABLETS BY MOUTH 30 MINS PRIOR INTERCOURSE, Disp: 50 tablet, Rfl: 11 .  Fluticasone-Salmeterol (ADVAIR DISKUS) 250-50 MCG/DOSE AEPB, Inhale 1 puff into the lungs 2 (two) times daily. (Patient not taking: Reported on 12/30/2016), Disp: 1 each, Rfl: 3

## 2017-01-07 ENCOUNTER — Inpatient Hospital Stay: Admission: RE | Admit: 2017-01-07 | Payer: Self-pay | Source: Ambulatory Visit

## 2017-01-07 ENCOUNTER — Ambulatory Visit (INDEPENDENT_AMBULATORY_CARE_PROVIDER_SITE_OTHER)
Admission: RE | Admit: 2017-01-07 | Discharge: 2017-01-07 | Disposition: A | Discharge status: Home / Self Care | Payer: 59 | Source: Ambulatory Visit | Attending: Pulmonary Disease | Admitting: Pulmonary Disease

## 2017-01-07 DIAGNOSIS — R0600 Dyspnea, unspecified: Secondary | ICD-10-CM | POA: Diagnosis not present

## 2017-01-07 DIAGNOSIS — R05 Cough: Secondary | ICD-10-CM | POA: Diagnosis not present

## 2017-01-07 DIAGNOSIS — R0602 Shortness of breath: Secondary | ICD-10-CM

## 2017-01-07 DIAGNOSIS — R059 Cough, unspecified: Secondary | ICD-10-CM

## 2017-01-08 ENCOUNTER — Telehealth: Payer: Self-pay | Admitting: Pulmonary Disease

## 2017-01-08 NOTE — Telephone Encounter (Signed)
Spoke with patient about results. He verbalized understanding and had no further questions.

## 2017-02-01 ENCOUNTER — Telehealth: Payer: Self-pay | Admitting: Pulmonary Disease

## 2017-02-01 MED ORDER — BECLOMETHASONE DIPROP HFA 80 MCG/ACT IN AERB: 2.0000 | INHALATION_SPRAY | Freq: Two times a day (BID) | RESPIRATORY_TRACT | 11 refills | Status: DC

## 2017-02-01 NOTE — Telephone Encounter (Signed)
[id number] x1 

## 2017-02-01 NOTE — Telephone Encounter (Signed)
Pt called back, requesting a refill on qvar.  This has been sent to preferred pharmacy.  Nothing further needed.

## 2017-03-29 DIAGNOSIS — H903 Sensorineural hearing loss, bilateral: Secondary | ICD-10-CM | POA: Diagnosis not present

## 2017-04-06 ENCOUNTER — Ambulatory Visit: Payer: Self-pay | Admitting: Pulmonary Disease

## 2017-04-06 ENCOUNTER — Ambulatory Visit (INDEPENDENT_AMBULATORY_CARE_PROVIDER_SITE_OTHER): Payer: 59 | Admitting: Pulmonary Disease

## 2017-04-06 ENCOUNTER — Encounter: Payer: Self-pay | Admitting: Pulmonary Disease

## 2017-04-06 VITALS — BP 144/76 | HR 82 | Ht 73.0 in | Wt 212.0 lb

## 2017-04-06 DIAGNOSIS — R05 Cough: Secondary | ICD-10-CM | POA: Diagnosis not present

## 2017-04-06 DIAGNOSIS — R059 Cough, unspecified: Secondary | ICD-10-CM

## 2017-04-06 DIAGNOSIS — J4540 Moderate persistent asthma, uncomplicated: Secondary | ICD-10-CM

## 2017-04-06 DIAGNOSIS — R0602 Shortness of breath: Secondary | ICD-10-CM

## 2017-04-06 LAB — PULMONARY FUNCTION TEST
DL/VA % pred: 117 %
DL/VA: 5.62 ml/min/mmHg/L
DLCO COR: 42.25 ml/min/mmHg
DLCO UNC: 42.13 ml/min/mmHg
DLCO cor % pred: 116 %
DLCO unc % pred: 115 %
FEF 25-75 POST: 4.11 L/s
FEF 25-75 PRE: 3.54 L/s
FEF2575-%Change-Post: 16 %
FEF2575-%PRED-PRE: 100 %
FEF2575-%Pred-Post: 116 %
FEV1-%Change-Post: 3 %
FEV1-%PRED-PRE: 103 %
FEV1-%Pred-Post: 106 %
FEV1-POST: 4.45 L
FEV1-Pre: 4.32 L
FEV1FVC-%Change-Post: 6 %
FEV1FVC-%PRED-PRE: 100 %
FEV6-%CHANGE-POST: -1 %
FEV6-%PRED-POST: 103 %
FEV6-%PRED-PRE: 105 %
FEV6-POST: 5.41 L
FEV6-Pre: 5.52 L
FEV6FVC-%CHANGE-POST: 1 %
FEV6FVC-%PRED-POST: 104 %
FEV6FVC-%Pred-Pre: 102 %
FVC-%Change-Post: -2 %
FVC-%PRED-POST: 100 %
FVC-%PRED-PRE: 103 %
FVC-POST: 5.44 L
FVC-Pre: 5.6 L
POST FEV6/FVC RATIO: 100 %
PRE FEV1/FVC RATIO: 77 %
Post FEV1/FVC ratio: 82 %
Pre FEV6/FVC Ratio: 98 %
RV % pred: 138 %
RV: 3.2 L
TLC % PRED: 113 %
TLC: 8.57 L

## 2017-04-06 NOTE — Progress Notes (Signed)
Subjective:    Patient ID: Austin Houston, male    DOB: 1961/01/28, 56 y.o.   MRN: [id number]  Synopsis: Referred in 2018 for mild persistent asthma which occurred after his [place] with burn pit exposure.  HPI Chief Complaint  Patient presents with  . Follow-up    review PFT.  pt states he is doing well since starting qvar.     [name] has been busy at work.  He decreased his QVar to 1 puffs twice a day.  Since starting Qvar he has not needed to use albuterol. Per my recommendation he decreased the dose to 2 puffs a day and he has not had increased symptoms with this. Prior to this he was using albuterol on a daily basis and was using it at night as well. He was also needing prednisone several times per year. He says that this was typically worse in the warm months spring through fall. Now he's well controlled.  Past Medical History:  Diagnosis Date  . Allergic rhinitis due to pollen   . Asthma    vs COPD- taking inhalers- as related to allergies only.  . Complication of anesthesia    Hypertension only not malignant in nature in teenage yrs after surgery.  Marland Kitchen COPD (chronic obstructive pulmonary disease) (HCC)    vs asthma  . 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 Fracture    left arm age 23-splinted only  . GERD (gastroesophageal reflux disease)   . Hearing impaired person, bilateral    hearing aida bilateral"high frequency loss  . Hyperlipidemia   . Hypertension   . Prostate cancer ([place]) 01/08/2016  . PTSD (post-traumatic stress disorder)    s/p multiple active deployments for [employer], no medications taken currently          Review of Systems  Constitutional: Negative for fever and unexpected weight change.  HENT: Negative for congestion, dental problem, ear pain, nosebleeds, postnasal drip, rhinorrhea, sinus pressure, sneezing, sore throat and trouble swallowing.   Eyes: Negative for redness and  itching.  Respiratory: Positive for cough, chest tightness, shortness of breath and wheezing.   Cardiovascular: Negative for palpitations and leg swelling.  Gastrointestinal: Negative for nausea and vomiting.  Genitourinary: Negative for dysuria.  Musculoskeletal: Negative for joint swelling.  Skin: Negative for rash.  Neurological: Negative for headaches.  Hematological: Does not bruise/bleed easily.  Psychiatric/Behavioral: Negative for dysphoric mood. The patient is not nervous/anxious.        Objective:   Physical Exam Vitals:   04/06/17 1014  BP: (!) 144/76  Pulse: 82  SpO2: 99%  Weight: 212 lb (96.2 kg)  Height: 6\' 1"  (1.854 m)     Gen: well appearing HENT: OP clear, TM's clear, neck supple PULM: CTA B, normal percussion CV: RRR, no mgr, trace edema GI: BS+, soft, nontender Derm: no cyanosis or rash Psyche: normal mood and affect    Chest imaging: February 2018 chest x-ray images and apparently reviewed showing slightly increased AP diameter normal cardiac silhouette normal pulmonary parenchyma otherwise  April 2018 primary care doctor notes reviewed were he was seen for a routine health maintenance visit wellness labs were collected. He was hospitalized in January 2018 for treatment of his prostate cancer  PFT: September 2018 ratio 77%, FEV1 4.32 L change to 4.45 L after bronchodilator 106% predicted, total lung capacity 8.6 L 113% predicted, DLCO 42.13 115%     Assessment & Plan:  Moderate  persistent reactive airway disease without complication  Moderate persistent asthma without complication  Discussion: He has moderate persistent reactive airways disease secondary to burn bit exposure while serving with the [employer] for many years. His symptoms are similar to an consistent with moderate persistent asthma and well controlled with an inhaled corticosteroid.  He can start decreasing the dose of QVar, and hopefully we will be able to have him only use the  QVar in the spring-fall months.  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 However, I would recommend that she restart Qvar 2 puffs daily in the spring. Continue using albuterol on an as-needed basis for chest tightness wheezing or shortness of breath Flu shot is up-to-date  We will see you back in one year or sooner if needed     Current Outpatient Prescriptions:  .  albuterol (VENTOLIN HFA) 108 (90 Base) MCG/ACT inhaler, INHALE 2 PUFFS INTO THE LUNGS EVERY 6 (SIX) HOURS AS NEEDED FOR WHEEZING., Disp: 56 g, Rfl: 3 .  amLODipine (NORVASC) 10 MG tablet, Take 1 tablet (10 mg total) by mouth daily., Disp: 90 tablet, Rfl: 3 .  beclomethasone (QVAR REDIHALER) 80 MCG/ACT inhaler, Inhale 2 puffs into the lungs 2 (two) times daily. (Patient taking differently: Inhale 2 puffs into the lungs daily. ), Disp: 1 Inhaler, Rfl: 11 .  Carboxymethylcellul-Glycerin (LUBRICATING EYE DROPS OP), Apply 1 drop to eye daily as needed (dry eyes)., Disp: , Rfl:  .  cetirizine (ZYRTEC) 10 MG tablet, Take 10 mg by mouth daily., Disp: , Rfl:  .  fluticasone (FLONASE) 50 MCG/ACT nasal spray, Place 2 sprays into both nostrils daily., Disp: , Rfl:  .  losartan (COZAAR) 100 MG tablet, Take 1 tablet (100 mg total) by mouth daily., Disp: 90 tablet, Rfl: 3 .  Multiple Vitamin (MULTIVITAMIN WITH MINERALS) TABS tablet, Take 1 tablet by mouth daily., Disp: , Rfl:  .  naproxen (NAPROSYN) 250 MG tablet, Take 500 mg by mouth 2 (two) times daily with a meal., Disp: , Rfl:  .  omeprazole (PRILOSEC) 40 MG capsule, TAKE 1 CAPSULE BY MOUTH DAILY., Disp: 90 capsule, Rfl: 3 .  ondansetron (ZOFRAN) 4 MG tablet, Take 1 tablet (4 mg total) by mouth every 8 (eight) hours as needed for nausea or vomiting., Disp: 20 tablet, Rfl: 2 .  promethazine (PHENERGAN) 25 MG suppository, Place 1 suppository (25 mg total)  rectally every 6 (six) hours as needed for nausea or vomiting., Disp: 12 each, Rfl: 2 .  sildenafil (REVATIO) 20 MG tablet, TAKE 2 TO 5 TABLETS BY MOUTH 30 MINS PRIOR INTERCOURSE, Disp: 50 tablet, Rfl: 11

## 2017-04-06 NOTE — Progress Notes (Signed)
PFT done today. 

## 2017-04-06 NOTE — Patient Instructions (Signed)
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 However, I would recommend that she restart Qvar 2 puffs daily in the spring. Continue using albuterol on an as-needed basis for chest tightness wheezing or shortness of breath Flu shot is up-to-date  We will see you back in one year or sooner if needed

## 2017-04-14 DIAGNOSIS — C61 Malignant neoplasm of prostate: Secondary | ICD-10-CM | POA: Diagnosis not present

## 2017-04-21 DIAGNOSIS — C61 Malignant neoplasm of prostate: Secondary | ICD-10-CM | POA: Diagnosis not present

## 2017-04-21 DIAGNOSIS — N5201 Erectile dysfunction due to arterial insufficiency: Secondary | ICD-10-CM | POA: Diagnosis not present

## 2017-04-21 DIAGNOSIS — N393 Stress incontinence (female) (male): Secondary | ICD-10-CM | POA: Diagnosis not present

## 2017-10-14 ENCOUNTER — Encounter: Payer: Self-pay | Admitting: Family Medicine

## 2017-10-28 DIAGNOSIS — C61 Malignant neoplasm of prostate: Secondary | ICD-10-CM | POA: Diagnosis not present

## 2017-11-02 ENCOUNTER — Encounter: Payer: Self-pay | Admitting: Family Medicine

## 2017-11-02 ENCOUNTER — Ambulatory Visit: Payer: Self-pay | Admitting: Family Medicine

## 2017-11-02 VITALS — Temp 98.3°F

## 2017-11-02 DIAGNOSIS — R05 Cough: Secondary | ICD-10-CM

## 2017-11-02 DIAGNOSIS — J9801 Acute bronchospasm: Secondary | ICD-10-CM

## 2017-11-02 DIAGNOSIS — R059 Cough, unspecified: Secondary | ICD-10-CM

## 2017-11-02 DIAGNOSIS — J329 Chronic sinusitis, unspecified: Secondary | ICD-10-CM

## 2017-11-02 MED ORDER — AMOXICILLIN-POT CLAVULANATE 875-125 MG PO TABS: 1.0000 | ORAL_TABLET | Freq: Two times a day (BID) | ORAL | 0 refills | Status: DC

## 2017-11-02 MED ORDER — GUAIFENESIN-CODEINE 100-10 MG/5ML PO SYRP: 5.0000 mL | ORAL_SOLUTION | Freq: Three times a day (TID) | ORAL | 0 refills | Status: DC | PRN

## 2017-11-02 NOTE — Patient Instructions (Addendum)
Start Augmentin 1 tablet twice daily for 10 days  with food to avoid stomach upset Complete all medication.  For cough, I am starting you on Guaifenesin with codiene, 5 ml up to 3 times daily as needed for cough.  Continue long-acting inhaler daily and short-acting as needed.  Return for care if no improvement.     Bronchospasm, Adult Bronchospasm is when airways in the lungs get smaller. When this happens, it can be hard to breathe. You may cough. You may also make a whistling sound when you breathe (wheeze). Follow these instructions at home: Medicines  Take over-the-counter and prescription medicines only as told by your doctor.  If you need to use an inhaler or nebulizer to take your medicine, ask your doctor how to use it.  If you were given a spacer, always use it with your inhaler. Lifestyle  Change your heating and air conditioning filter. Do this at least once a month.  Try not to use fireplaces and wood stoves.  Do not  smoke. Do not  allow smoking in your home.  Try not to use things that have a strong smell, like perfume.  Get rid of pests (such as roaches and mice) and their poop.  Remove any mold from your home.  Keep your house clean. Get rid of dust.  Use cleaning products that have no smell.  Replace carpet with wood, tile, or vinyl flooring.  Use allergy-proof pillows, mattress covers, and box spring covers.  Wash bed sheets and blankets every week. Use hot water. Dry them in a dryer.  Use blankets that are made of polyester or cotton.  Wash your hands often.  Keep pets out of your bedroom.  When you exercise, try not to breathe in cold air. General instructions  Have a plan for getting medical care. Know these things: ? When to call your doctor. ? When to call local emergency services (911 in the [place] ? Where to go in an emergency.  Stay up to date on your shots (immunizations).  When you have an episode: ? Stay  calm. ? Relax. ? Breathe slowly. Contact a doctor if:  Your muscles ache.  Your chest hurts.  The color of the mucus you cough up (sputum) changes from clear or white to yellow, green, gray, or bloody.  The mucus you cough up gets thicker.  You have a fever. Get help right away if:  The whistling sound gets worse, even after you take your medicines.  Your coughing gets worse.  You find it even harder to breathe.  Your chest hurts very much. Summary  Bronchospasm is when airways in the lungs get smaller.  When this happens, it can be hard to breathe. You may cough. You may also make a whistling sound when you breathe.  Stay away from things that cause you to have episodes. These include smoke or dust. This information is not intended to replace advice given to you by your health care provider. Make sure you discuss any questions you have with your health care provider. Document Released: 04/26/2009 Document Revised: 07/02/2016 Document Reviewed: 07/02/2016 Elsevier Interactive Patient Education  2017 Elsevier Inc.     Sinusitis, Adult Sinusitis is soreness and inflammation of your sinuses. Sinuses are hollow spaces in the bones around your face. They are located:  Around your eyes.  In the middle of your forehead.  Behind your nose.  In your cheekbones.  Your sinuses and nasal passages are lined with a stringy fluid (  mucus). Mucus normally drains out of your sinuses. When your nasal tissues get inflamed or swollen, the mucus can get trapped or blocked so air cannot flow through your sinuses. This lets bacteria, viruses, and funguses grow, and that leads to infection. Follow these instructions at home: Medicines  Take, use, or apply over-the-counter and prescription medicines only as told by your doctor. These may include nasal sprays.  If you were prescribed an antibiotic medicine, take it as told by your doctor. Do not stop taking the antibiotic even if you  start to feel better. Hydrate and Humidify  Drink enough water to keep your pee (urine) clear or pale yellow.  Use a cool mist humidifier to keep the humidity level in your home above 50%.  Breathe in steam for 10-15 minutes, 3-4 times a day or as told by your doctor. You can do this in the bathroom while a hot shower is running.  Try not to spend time in cool or dry air. Rest  Rest as much as possible.  Sleep with your head raised (elevated).  Make sure to get enough sleep each night. General instructions  Put a warm, moist washcloth on your face 3-4 times a day or as told by your doctor. This will help with discomfort.  Wash your hands often with soap and water. If there is no soap and water, use hand sanitizer.  Do not smoke. Avoid being around people who are smoking (secondhand smoke).  Keep all follow-up visits as told by your doctor. This is important. Contact a doctor if:  You have a fever.  Your symptoms get worse.  Your symptoms do not get better within 10 days. Get help right away if:  You have a very bad headache.  You cannot stop throwing up (vomiting).  You have pain or swelling around your face or eyes.  You have trouble seeing.  You feel confused.  Your neck is stiff.  You have trouble breathing. This information is not intended to replace advice given to you by your health care provider. Make sure you discuss any questions you have with your health care provider. Document Released: 12/16/2007 Document Revised: 02/23/2016 Document Reviewed: 04/24/2015 Elsevier Interactive Patient Education  [employer].

## 2017-11-02 NOTE — Progress Notes (Signed)
Patient ID: Austin Houston, male    DOB: 20-Oct-1960, 57 y.o.   MRN: [id number]  PCP: Owens Loffler, MD  Chief Complaint  Patient presents with  . save-congestion/green mucus    Subjective:  HPI Austin Houston is a 57 y.o. male presents for evaluation of URI like symptoms. Patient reports a 10-day history of persistent nasal congestion, worsening cough, and chest congestion. He has underlying chronic bronchitis and chronically uses a long-acting Qvar and albuterol as needed for acute shortness of breath or wheezing.  He reports since experiencing this current course of illness he has had experienced increased use of his short acting inhaler with an average use of up to 3 times per day.  Coughing is worsened at night. He complains of associated postnasal drainage and scratchy throat. He denies fever, body aches, chills, or current headache.  He has attempted relief with Mucinex and guaifenesin without significant improvement of symptoms. Social History   Socioeconomic History  . Marital status: Married    Spouse name: Not on file  . Number of children: Not on file  . Years of education: Not on file  . Highest education level: Not on file  Occupational History  . Occupation: Cardiac Specialist    Employer: [employer]    Comment: EMTP Cath Lab and Hybrid OR at [employer]  . Financial resource strain: Not on file  . Food insecurity:    Worry: Not on file    Inability: Not on file  . Transportation needs:    Medical: Not on file    Non-medical: Not on file  Tobacco Use  . Smoking status: Never Smoker  . Smokeless tobacco: Former Systems developer    Types: Snuff  Substance and Sexual Activity  . Alcohol use: Yes    Alcohol/week: 6.0 oz    Types: 10 Cans of beer per week    Comment: moderate  . Drug use: No  . Sexual activity: Yes    Partners: Female  Lifestyle  . Physical activity:    Days per week: Not on file    Minutes per session: Not on file  . Stress: Not on  file  Relationships  . Social connections:    Talks on phone: Not on file    Gets together: Not on file    Attends religious service: Not on file    Active member of club or organization: Not on file    Attends meetings of clubs or organizations: Not on file    Relationship status: Not on file  . Intimate partner violence:    Fear of current or ex partner: Not on file    Emotionally abused: Not on file    Physically abused: Not on file    Forced sexual activity: Not on file  Other Topics Concern  . 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   Active duty and Magazine features editor    Family History  Problem Relation Age of Onset  . Heart disease Paternal Grandmother   . Prostate cancer Father        Recurrent x 3  . Hyperlipidemia Father   . Other Father        varicose veins  . Hyperlipidemia Mother   . Other Mother  varicose veins  . Hypertension Maternal Grandmother   . Hypertension Maternal Grandfather   . Sudden death Brother 56  . Colon cancer Neg Hx   . Esophageal cancer Neg Hx   . Rectal cancer Neg Hx   . Stomach cancer Neg Hx    Review of Systems Pertinent negatives listed in HPI  Patient Active Problem List   Diagnosis Date Noted  . Prostate cancer ([place]) 01/08/2016  . Family history of prostate cancer in father 06/19/2014  . Varicose veins of lower extremities with other complications 16/04/9603  . Former smokeless tobacco use 11/25/2012  . 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 GERD (gastroesophageal reflux disease)   . Allergic rhinitis due to pollen   . Hypertension   . Hyperlipidemia   . PTSD (post-traumatic stress disorder)     Allergies  Allergen Reactions  . Vioxx [Rofecoxib] Anaphylaxis    Tolerates aspirin and naproxen.  . Celebrex [Celecoxib] Rash    Tolerates aspirin and naproxen.  .  Codeine Rash  . Doxycycline Rash    Prior to Admission medications   Medication Sig Start Date End Date Taking? Authorizing Provider  albuterol (VENTOLIN HFA) 108 (90 Base) MCG/ACT inhaler INHALE 2 PUFFS INTO THE LUNGS EVERY 6 (SIX) HOURS AS NEEDED FOR WHEEZING. 10/21/16  Yes Copland, Frederico Hamman, MD  amLODipine (NORVASC) 10 MG tablet Take 1 tablet (10 mg total) by mouth daily. 10/21/16  Yes Copland, Frederico Hamman, MD  beclomethasone (QVAR REDIHALER) 80 MCG/ACT inhaler Inhale 2 puffs into the lungs 2 (two) times daily. Patient taking differently: Inhale 2 puffs into the lungs daily.  02/01/17  Yes Juanito Doom, MD  Carboxymethylcellul-Glycerin (LUBRICATING EYE DROPS OP) Apply 1 drop to eye daily as needed (dry eyes).   Yes [provider]  cetirizine (ZYRTEC) 10 MG tablet Take 10 mg by mouth daily.   Yes [provider]  fluticasone (FLONASE) 50 MCG/ACT nasal spray Place 2 sprays into both nostrils daily.   Yes [provider]  losartan (COZAAR) 100 MG tablet Take 1 tablet (100 mg total) by mouth daily. 10/21/16  Yes Copland, Frederico Hamman, MD  Multiple Vitamin (MULTIVITAMIN WITH MINERALS) TABS tablet Take 1 tablet by mouth daily.   Yes [provider]  naproxen (NAPROSYN) 250 MG tablet Take 500 mg by mouth 2 (two) times daily with a meal.   Yes [provider]  omeprazole (PRILOSEC) 40 MG capsule TAKE 1 CAPSULE BY MOUTH DAILY. 11/03/16  Yes Copland, Frederico Hamman, MD  ondansetron (ZOFRAN) 4 MG tablet Take 1 tablet (4 mg total) by mouth every 8 (eight) hours as needed for nausea or vomiting. 10/21/16  Yes Copland, Frederico Hamman, MD  promethazine (PHENERGAN) 25 MG suppository Place 1 suppository (25 mg total) rectally every 6 (six) hours as needed for nausea or vomiting. 10/21/16  Yes Copland, Frederico Hamman, MD  sildenafil (REVATIO) 20 MG tablet TAKE 2 TO 5 TABLETS BY MOUTH 30 MINS PRIOR INTERCOURSE 10/28/16  Yes Copland, Frederico Hamman, MD    Past Medical, Surgical Family and Social History  reviewed and updated.    Objective:   Today's Vitals   11/02/17 0930  Temp: 98.3 F (36.8 C)  SpO2: 98%    Wt Readings from Last 3 Encounters:  04/06/17 212 lb (96.2 kg)  12/30/16 216 lb (98 kg)  10/21/16 212 lb (96.2 kg)    Physical Exam  Constitutional: He appears well-developed and well-nourished.  HENT:  Head: Normocephalic.  Left Ear: External ear normal.  Nose: Mucosal edema and rhinorrhea present.  Mouth/Throat: Oropharynx is clear and moist.  Bilateral hearing aid devices noted.  Eyes: EOM are normal.  Neck: Normal range of motion. Neck supple.  Pulmonary/Chest: Effort normal and breath sounds normal.  Lymphadenopathy:    He has no cervical adenopathy.    Assessment & Plan:  1. Sinusitis, unspecified chronicity, unspecified location, treated empirically with broad-spectrum antibiotic.  Augmentin 875-125 1 tablet every 12 hours x10 days.  Patient chronically takes Zyrtec recommend continuing for antihistamine effect.  2. Cough, guaifenesin with codeine 5 mL's up to 3 times daily as needed for cough.  3. Bronchospasm, continue Qvar as prescribed and albuterol 2 puffs every 4-6 hours as needed for wheezing or shortness of breath.   If symptoms worsen or do not improve, return for follow-up, follow-up with PCP, or at the emergency department if severity of symptoms warrant a higher level of care.   Carroll Sage. Kenton Kingfisher, MSN, [place], [place] [address] [phone]

## 2017-11-03 DIAGNOSIS — N5201 Erectile dysfunction due to arterial insufficiency: Secondary | ICD-10-CM | POA: Diagnosis not present

## 2017-11-03 DIAGNOSIS — C61 Malignant neoplasm of prostate: Secondary | ICD-10-CM | POA: Diagnosis not present

## 2017-11-04 ENCOUNTER — Telehealth: Payer: Self-pay | Admitting: Emergency Medicine

## 2017-11-04 NOTE — Telephone Encounter (Signed)
Left message following up on patients visit with [facility]

## 2017-12-08 ENCOUNTER — Other Ambulatory Visit: Payer: Self-pay

## 2017-12-16 ENCOUNTER — Encounter: Payer: Self-pay | Admitting: Family Medicine

## 2017-12-16 ENCOUNTER — Other Ambulatory Visit: Payer: Self-pay

## 2017-12-16 ENCOUNTER — Ambulatory Visit (INDEPENDENT_AMBULATORY_CARE_PROVIDER_SITE_OTHER): Payer: 59 | Admitting: Family Medicine

## 2017-12-16 VITALS — BP 120/78 | HR 68 | Temp 98.0°F | Ht 71.75 in | Wt 210.5 lb

## 2017-12-16 DIAGNOSIS — Z0000 Encounter for general adult medical examination without abnormal findings: Secondary | ICD-10-CM | POA: Diagnosis not present

## 2017-12-16 DIAGNOSIS — E7800 Pure hypercholesterolemia, unspecified: Secondary | ICD-10-CM

## 2017-12-16 DIAGNOSIS — Z79899 Other long term (current) drug therapy: Secondary | ICD-10-CM

## 2017-12-16 NOTE — Progress Notes (Addendum)
Dr. Frederico Hamman T. Quincey Nored, MD, [facility] [place], [address] Phone: [phone] Fax: [phone]  12/16/2017  Patient: Austin Houston, MRN: [id number], DOB: 07-Dec-1960, 57 y.o.  Primary Physician:  Owens Loffler, MD   Chief Complaint  Patient presents with  . Annual Exam   Subjective:   Austin Houston is a 57 y.o. pleasant patient who presents with the following:  Preventative Health Maintenance Visit:  Health Maintenance Summary Reviewed and updated, unless pt declines services.  Tobacco History Reviewed. Alcohol: No concerns, no excessive use Exercise Habits: Some activity, rec at least 30 mins 5 times a week STD concerns: no risk or activity to increase risk Drug Use: None Encouraged self-testicular check   Daughter got married last October. Mom fell and got a humeral fracture. Going back and forth to [place]. Fasciotomy, back and forth to [place]. Retroperitoneal bleed large and hgb to 7.   Dad with lewy body dementia. Step sister has inpatient alcohol rehab  And other with breast cancer.   Trouble sleeping. Dreading to make drive to [place]. No SI or HI. No end in site. Energy +/-.    Health Maintenance  Topic Date Due  . INFLUENZA VACCINE  02/10/2018  . COLONOSCOPY  01/30/2019  . TETANUS/TDAP  10/22/2026  . Hepatitis C Screening  Completed  . HIV Screening  Completed   Immunization History  Administered Date(s) Administered  . Influenza Split 09/10/2016  . Influenza,inj,Quad PF,6+ Mos 04/13/2015  . Influenza-Unspecified 03/31/2017  . Tdap 10/21/2016   Patient Active Problem List   Diagnosis Date Noted  . 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 Prostate cancer ([place]) 01/08/2016  . Family history of prostate cancer in father 06/19/2014  . Varicose veins of lower extremities with other complications 46/65/9935  . Former smokeless tobacco use 11/25/2012  . GERD  (gastroesophageal reflux disease)   . Allergic rhinitis due to pollen   . Hypertension   . Hyperlipidemia   . PTSD (post-traumatic stress disorder)    Past Medical History:  Diagnosis Date  . Allergic rhinitis due to pollen   . Asthma    vs COPD- taking inhalers- as related to allergies only.  . Complication of anesthesia    Hypertension only not malignant in nature in teenage yrs after surgery.  Marland Kitchen COPD (chronic obstructive pulmonary disease) ([facility])    vs asthma  . Follicular cancer of thyroid [facility])    s/p partial thyroidectomy (follicular adenoma)-surgery only  . Fracture    left arm age 72-splinted only  . GERD (gastroesophageal reflux disease)   . Hearing impaired person, bilateral    hearing aida bilateral"high frequency loss  . Hyperlipidemia   . Hypertension   . Prostate cancer ([place]) 01/08/2016  . PTSD (post-traumatic stress disorder)    s/p multiple active deployments for [employer], no medications taken currently   Past Surgical History:  Procedure Laterality Date  . COLONOSCOPY W/ POLYPECTOMY     age 89 "adenoma"  . INGUINAL HERNIA REPAIR     right  . ROBOT ASSISTED LAPAROSCOPIC RADICAL PROSTATECTOMY N/A 07/23/2016   Procedure: XI ROBOTIC ASSISTED LAPAROSCOPIC RADICAL PROSTATECTOMY LEVEL 1;  Surgeon: Raynelle Bring, MD;  Location: [facility];  Service: Urology;  Laterality: N/A;  . THYROIDECTOMY, PARTIAL     [name])   Social History   Socioeconomic History  . Marital status: Married    Spouse name: Not on file  .  Number of children: Not on file  . Years of education: Not on file  . Highest education level: Not on file  Occupational History  . Occupation: Cardiac Specialist    Employer: [employer]    Comment: EMTP Cath Lab and Hybrid OR at [employer]  . Financial resource strain: Not on file  . Food insecurity:    Worry: Not on file    Inability: Not on file  . Transportation needs:    Medical: Not on file    Non-medical: Not on file    Tobacco Use  . Smoking status: Never Smoker  . Smokeless tobacco: Former Systems developer    Types: Snuff  Substance and Sexual Activity  . Alcohol use: Yes    Alcohol/week: 6.0 oz    Types: 10 Cans of beer per week    Comment: moderate  . Drug use: No  . Sexual activity: Yes    Partners: Female  Lifestyle  . Physical activity:    Days per week: Not on file    Minutes per session: Not on file  . Stress: Not on file  Relationships  . Social connections:    Talks on phone: Not on file    Gets together: Not on file    Attends religious service: Not on file    Active member of club or organization: Not on file    Attends meetings of clubs or organizations: Not on file    Relationship status: Not on file  . Intimate partner violence:    Fear of current or ex partner: Not on file    Emotionally abused: Not on file    Physically abused: Not on file    Forced sexual activity: Not on file  Other Topics Concern  . 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 Heart disease Paternal Grandmother   . Prostate cancer Father        Recurrent x 3  . Hyperlipidemia Father   . Other Father        varicose veins  . Hyperlipidemia Mother   . Other Mother        varicose veins  . Hypertension Maternal Grandmother   . Hypertension Maternal Grandfather   . Sudden death Brother 8  . Colon cancer Neg Hx   . Esophageal cancer Neg Hx   . Rectal cancer Neg Hx   . Stomach cancer Neg Hx    Allergies  Allergen Reactions  . Vioxx [Rofecoxib] Anaphylaxis    Tolerates aspirin and naproxen.  . Celebrex [Celecoxib] Rash    Tolerates aspirin and naproxen.  . Codeine Rash    Patient reports this happened as a child. He has tolerated Codeine since that time  . Doxycycline  Rash    Medication list has been reviewed and updated.   General: Denies fever, chills, sweats. No significant weight loss. Eyes: Denies blurring,significant itching ENT: Denies earache, sore throat, and hoarseness. Cardiovascular: Denies chest pains, palpitations, dyspnea on exertion Respiratory: Denies cough, dyspnea at rest,wheeezing Breast: no concerns about lumps GI: Denies nausea, vomiting, diarrhea, constipation, change in bowel habits, abdominal pain, melena, hematochezia GU: Denies penile discharge, ED, urinary flow / outflow problems.  No STD concerns. Musculoskeletal: Denies back pain, joint pain Derm: Denies rash, itching Neuro: Denies  paresthesias, frequent falls, frequent headaches Psych: A lot of stress at home - as above Endocrine: Denies cold intolerance, heat intolerance, polydipsia Heme: Denies enlarged lymph nodes Allergy: No hayfever  Objective:   BP 120/78   Pulse 68   Temp 98 F (36.7 C) (Oral)   Ht 5' 11.75" (1.822 m)   Wt 210 lb 8 oz (95.5 kg)   BMI 28.75 kg/m  Ideal Body Weight: Weight in (lb) to have BMI = 25: 182.7  No exam data present  GEN: well developed, well nourished, no acute distress Eyes: conjunctiva and lids normal, PERRLA, EOMI ENT: TM clear, nares clear, oral exam WNL Neck: supple, no lymphadenopathy, no thyromegaly, no JVD Pulm: clear to auscultation and percussion, respiratory effort normal CV: regular rate and rhythm, S1-S2, no murmur, rub or gallop, no bruits, peripheral pulses normal and symmetric, no cyanosis, clubbing, edema or varicosities GI: soft, non-tender; no hepatosplenomegaly, masses; active bowel sounds all quadrants GU: intermittent ED, PSA undetectable Lymph: no cervical, axillary or inguinal adenopathy MSK: gait normal, muscle tone and strength WNL, no joint swelling, effusions, discoloration, crepitus  SKIN: clear, good turgor, color WNL, no rashes, lesions, or ulcerations Neuro: normal mental status, normal  strength, sensation, and motion Psych: alert; oriented to person, place and time, normally interactive and not anxious or depressed in appearance. All labs reviewed with patient.  Lipids:    Component Value Date/Time   CHOL 240 (H) 10/21/2016 1613   TRIG 126.0 10/21/2016 1613   HDL 67.40 10/21/2016 1613   VLDL 25.2 10/21/2016 1613   CHOLHDL 4 10/21/2016 1613   CBC: CBC Latest Ref Rng & Units 10/21/2016 07/24/2016 07/23/2016  WBC 4.0 - 10.5 K/uL 5.7 - -  Hemoglobin 13.0 - 17.0 g/dL 15.4 11.9(L) 13.9  Hematocrit 39.0 - 52.0 % 44.8 34.7(L) 40.0  Platelets 150.0 - 400.0 K/uL 315.0 - -    Basic Metabolic Panel:    Component Value Date/Time   NA 139 10/21/2016 1613   K 4.5 10/21/2016 1613   CL 101 10/21/2016 1613   CO2 28 10/21/2016 1613   BUN 15 10/21/2016 1613   CREATININE 1.13 10/21/2016 1613   GLUCOSE 77 10/21/2016 1613   CALCIUM 10.1 10/21/2016 1613   Hepatic Function Latest Ref Rng & Units 10/21/2016 09/25/2015 06/15/2014  Total Protein 6.0 - 8.3 g/dL 7.6 7.1 7.2  Albumin 3.5 - 5.2 g/dL 4.6 4.7 4.5  AST 0 - 37 U/L 36 85(H) 64(H)  ALT 0 - 53 U/L 46 97(H) 96(H)  Alk Phosphatase 39 - 117 U/L 52 54 55  Total Bilirubin 0.2 - 1.2 mg/dL 0.5 0.6 1.0  Bilirubin, Direct 0.0 - 0.3 mg/dL 0.2 0.2 -    Lab Results  Component Value Date   TSH 4.42 10/21/2016   Lab Results  Component Value Date   PSA 4.36 (H) 11/01/2015   PSA 4.67 (H) 09/25/2015   PSA 3.47 06/15/2014    Assessment and Plan:   Healthcare maintenance  Doing reasonably ok given very large amount of psychosocial stress  Addendum, 01/30/18   I made an error when coding the patient's physical exam laboratory codes associated with that lab draw.   His BMP, Lipid panel, CBC, and hepatic function panel should have all been billed under his health maintenance code. Z00.00 Electronically Signed  By: Owens Loffler, MD On: 01/30/2018 2:27 PM   Health Maintenance Exam: The patient's preventative maintenance and  recommended screening tests for an annual wellness exam were reviewed in full today. Brought up to date unless services declined.  Counselled on the importance of diet, exercise, and its role in overall health and mortality. The patient's FH and SH was reviewed, including their home life, tobacco status, and drug and alcohol status.  Follow-up in 1 year for physical exam or additional follow-up below.  Follow-up: No follow-ups on file. Or follow-up in 1 year if not noted.  Signed,  Maud Deed. Jazlen Ogarro, MD   Allergies as of 12/16/2017      Reactions   Vioxx [rofecoxib] Anaphylaxis   Tolerates aspirin and naproxen.   Celebrex [celecoxib] Rash   Tolerates aspirin and naproxen.   Codeine Rash   Patient reports this happened as a child. He has tolerated Codeine since that time   Doxycycline Rash      Medication List        Accurate as of 12/16/17  8:28 AM. Always use your most recent med list.          albuterol 108 (90 Base) MCG/ACT inhaler Commonly known as:  VENTOLIN HFA INHALE 2 PUFFS INTO THE LUNGS EVERY 6 (SIX) HOURS AS NEEDED FOR WHEEZING.   amLODipine 10 MG tablet Commonly known as:  NORVASC Take 1 tablet (10 mg total) by mouth daily.   beclomethasone 80 MCG/ACT inhaler Commonly known as:  QVAR Inhale 2 puffs into the lungs daily.   cetirizine 10 MG tablet Commonly known as:  ZYRTEC Take 10 mg by mouth daily.   fluticasone 50 MCG/ACT nasal spray Commonly known as:  FLONASE Place 2 sprays into both nostrils daily.   losartan 100 MG tablet Commonly known as:  COZAAR Take 1 tablet (100 mg total) by mouth daily.   LUBRICATING EYE DROPS OP Apply 1 drop to eye daily as needed (dry eyes).   multivitamin with minerals Tabs tablet Take 1 tablet by mouth daily.   naproxen 250 MG tablet Commonly known as:  NAPROSYN Take 500 mg by mouth 2 (two) times daily with a meal.   omeprazole 40 MG capsule Commonly known as:  PRILOSEC TAKE 1 CAPSULE BY MOUTH DAILY.     ondansetron 4 MG tablet Commonly known as:  ZOFRAN Take 1 tablet (4 mg total) by mouth every 8 (eight) hours as needed for nausea or vomiting.   promethazine 25 MG suppository Commonly known as:  PHENERGAN Place 1 suppository (25 mg total) rectally every 6 (six) hours as needed for nausea or vomiting.   sildenafil 20 MG tablet Commonly known as:  REVATIO TAKE 2 TO 5 TABLETS BY MOUTH 30 MINS PRIOR INTERCOURSE

## 2017-12-23 ENCOUNTER — Other Ambulatory Visit (INDEPENDENT_AMBULATORY_CARE_PROVIDER_SITE_OTHER): Payer: 59

## 2017-12-23 DIAGNOSIS — Z79899 Other long term (current) drug therapy: Secondary | ICD-10-CM

## 2017-12-23 DIAGNOSIS — Z0000 Encounter for general adult medical examination without abnormal findings: Secondary | ICD-10-CM | POA: Diagnosis not present

## 2017-12-23 DIAGNOSIS — E7800 Pure hypercholesterolemia, unspecified: Secondary | ICD-10-CM | POA: Diagnosis not present

## 2017-12-23 LAB — LIPID PANEL
CHOLESTEROL: 188 mg/dL (ref 0–200)
HDL: 92.7 mg/dL (ref >39.00)
LDL CALC: 86 mg/dL (ref 0–99)
NonHDL: 94.93
TRIGLYCERIDES: 45 mg/dL (ref 0.0–149.0)
Total CHOL/HDL Ratio: 2
VLDL: 9 mg/dL (ref 0.0–40.0)

## 2017-12-23 LAB — CBC WITH DIFFERENTIAL/PLATELET
Basophils Absolute: 0 10*3/uL (ref 0.0–0.1)
Basophils Relative: 1.1 % (ref 0.0–3.0)
EOS PCT: 7.4 % — AB (ref 0.0–5.0)
Eosinophils Absolute: 0.2 10*3/uL (ref 0.0–0.7)
HCT: 40.7 % (ref 39.0–52.0)
Hemoglobin: 14.3 g/dL (ref 13.0–17.0)
LYMPHS ABS: 1 10*3/uL (ref 0.7–4.0)
Lymphocytes Relative: 29.8 % (ref 12.0–46.0)
MCHC: 35.1 g/dL (ref 30.0–36.0)
MCV: 95.2 fl (ref 78.0–100.0)
Monocytes Absolute: 0.3 10*3/uL (ref 0.1–1.0)
Monocytes Relative: 10 % (ref 3.0–12.0)
NEUTROS ABS: 1.7 10*3/uL (ref 1.4–7.7)
NEUTROS PCT: 51.7 % (ref 43.0–77.0)
PLATELETS: 290 10*3/uL (ref 150.0–400.0)
RBC: 4.28 Mil/uL (ref 4.22–5.81)
RDW: 12.4 % (ref 11.5–15.5)
WBC: 3.3 10*3/uL — ABNORMAL LOW (ref 4.0–10.5)

## 2017-12-23 LAB — BASIC METABOLIC PANEL
BUN: 14 mg/dL (ref 6–23)
CO2: 30 mEq/L (ref 19–32)
Calcium: 9.6 mg/dL (ref 8.4–10.5)
Chloride: 102 mEq/L (ref 96–112)
Creatinine, Ser: 1 mg/dL (ref 0.40–1.50)
GFR: 82.32 mL/min (ref >60.00)
GLUCOSE: 89 mg/dL (ref 70–99)
POTASSIUM: 4.6 meq/L (ref 3.5–5.1)
Sodium: 138 mEq/L (ref 135–145)

## 2017-12-23 LAB — HEPATIC FUNCTION PANEL
ALT: 64 U/L — AB (ref 0–53)
AST: 48 U/L — ABNORMAL HIGH (ref 0–37)
Albumin: 4.4 g/dL (ref 3.5–5.2)
Alkaline Phosphatase: 61 U/L (ref 39–117)
BILIRUBIN TOTAL: 0.6 mg/dL (ref 0.2–1.2)
Bilirubin, Direct: 0.2 mg/dL (ref 0.0–0.3)
Total Protein: 7.1 g/dL (ref 6.0–8.3)

## 2017-12-31 ENCOUNTER — Other Ambulatory Visit: Payer: Self-pay | Admitting: Family Medicine

## 2018-01-28 ENCOUNTER — Encounter: Payer: Self-pay | Admitting: Family Medicine

## 2018-04-27 ENCOUNTER — Encounter: Payer: Self-pay | Admitting: Pulmonary Disease

## 2018-04-27 ENCOUNTER — Ambulatory Visit (INDEPENDENT_AMBULATORY_CARE_PROVIDER_SITE_OTHER): Payer: 59 | Admitting: Pulmonary Disease

## 2018-04-27 VITALS — BP 158/92 | HR 90 | Ht 72.52 in | Wt 211.0 lb

## 2018-04-27 DIAGNOSIS — J4530 Mild persistent asthma, uncomplicated: Secondary | ICD-10-CM

## 2018-04-27 MED ORDER — BECLOMETHASONE DIPROPIONATE 80 MCG/ACT IN AERS: 2.0000 | INHALATION_SPRAY | Freq: Every day | RESPIRATORY_TRACT | 3 refills | Status: DC

## 2018-04-27 NOTE — Patient Instructions (Signed)
Mild persistent asthma: Continue taking Qvar 80 mcg 1 puff twice a day Continue to use albuterol as needed for chest tightness wheezing or shortness of breath I am glad you have had a flu shot Stay active Practice good hand hygiene  Follow-up in 1 year or sooner if needed

## 2018-04-27 NOTE — Progress Notes (Signed)
Subjective:    Patient ID: Austin Houston, male    DOB: 1961/01/12, 57 y.o.   MRN: [id number]  Synopsis: Referred in 2018 for mild persistent asthma which occurred after his [place] with burn pit exposure.  HPI Chief Complaint  Patient presents with  . Follow-up    reports feeling better    From a respiratory standpoint he has not had any problems this year.  He says that his cough has improved and he has not had any severe episodes of shortness of breath.  He says that he was unable to come off of Qvar altogether and he ended up taking albuterol more frequently when he came down to Qvar 1 puff daily so he went back to taking it twice a day.  With this dose he says that his shortness of breath has improved and he has used minimal albuterol.  Past Medical History:  Diagnosis Date  . Allergic rhinitis due to pollen   . Asthma    vs COPD- taking inhalers- as related to allergies only.  . Complication of anesthesia    Hypertension only not malignant in nature in teenage yrs after surgery.  Marland Kitchen COPD (chronic obstructive pulmonary disease) (HCC)    vs asthma  . 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 Fracture    left arm age 17-splinted only  . GERD (gastroesophageal reflux disease)   . Hearing impaired person, bilateral    hearing aida bilateral"high frequency loss  . Hyperlipidemia   . Hypertension   . Prostate cancer ([place]) 01/08/2016  . PTSD (post-traumatic stress disorder)    s/p multiple active deployments for [employer], no medications taken currently          Review of Systems  Constitutional: Negative for fever and unexpected weight change.  HENT: Negative for congestion, dental problem, ear pain, nosebleeds, postnasal drip, rhinorrhea, sinus pressure, sneezing, sore throat and trouble swallowing.   Eyes: Negative for redness and itching.  Respiratory: Positive for cough, chest tightness, shortness of  breath and wheezing.   Cardiovascular: Negative for palpitations and leg swelling.  Gastrointestinal: Negative for nausea and vomiting.  Genitourinary: Negative for dysuria.  Musculoskeletal: Negative for joint swelling.  Skin: Negative for rash.  Neurological: Negative for headaches.  Hematological: Does not bruise/bleed easily.  Psychiatric/Behavioral: Negative for dysphoric mood. The patient is not nervous/anxious.        Objective:   Physical Exam Vitals:   04/27/18 1022  BP: (!) 158/92  Pulse: 90  SpO2: 99%  Weight: 211 lb (95.7 kg)  Height: 6' 0.52" (1.842 m)     Gen: well appearing HENT: OP clear, TM's clear, neck supple PULM: CTA B, normal percussion CV: RRR, no mgr, trace edema GI: BS+, soft, nontender Derm: no cyanosis or rash Psyche: normal mood and affect     Chest imaging: February 2018 chest x-ray images and apparently reviewed showing slightly increased AP diameter normal cardiac silhouette normal pulmonary parenchyma otherwise  April 2018 primary care doctor notes reviewed were he was seen for a routine health maintenance visit wellness labs were collected. He was hospitalized in January 2018 for treatment of his prostate cancer  PFT: September 2018 ratio 77%, FEV1 4.32 L change to 4.45 L after bronchodilator 106% predicted, total lung capacity 8.6 L 113% predicted, DLCO 42.13 115%     Assessment & Plan:  Mild persistent asthma without complication  Discussion: This has been a stable interval without  exacerbation.  His mild persistent asthma is significantly controlled with 2 puffs of Qvar a day.  I do not believe he can go lower than this based on self titrating of the medicine at home. He has had a flu shot.  Plan: Mild persistent asthma: Continue taking Qvar 80 mcg 1 puff twice a day Continue to use albuterol as needed for chest tightness wheezing or shortness of breath I am glad you have had a flu shot Stay active Practice good hand  hygiene  Follow-up in 1 year or sooner if needed    Current Outpatient Medications:  .  albuterol (VENTOLIN HFA) 108 (90 Base) MCG/ACT inhaler, INHALE 2 PUFFS INTO THE LUNGS EVERY 6 (SIX) HOURS AS NEEDED FOR WHEEZING., Disp: 56 g, Rfl: 3 .  amLODipine (NORVASC) 10 MG tablet, TAKE 1 TABLET (10 MG TOTAL) BY MOUTH DAILY., Disp: 90 tablet, Rfl: 3 .  beclomethasone (QVAR) 80 MCG/ACT inhaler, Inhale 2 puffs into the lungs daily., Disp: 3 Inhaler, Rfl: 3 .  Carboxymethylcellul-Glycerin (LUBRICATING EYE DROPS OP), Apply 1 drop to eye daily as needed (dry eyes)., Disp: , Rfl:  .  cetirizine (ZYRTEC) 10 MG tablet, Take 10 mg by mouth daily., Disp: , Rfl:  .  fluticasone (FLONASE) 50 MCG/ACT nasal spray, Place 2 sprays into both nostrils daily., Disp: , Rfl:  .  losartan (COZAAR) 100 MG tablet, TAKE 1 TABLET (100 MG TOTAL) BY MOUTH DAILY., Disp: 90 tablet, Rfl: 3 .  Multiple Vitamin (MULTIVITAMIN WITH MINERALS) TABS tablet, Take 1 tablet by mouth daily., Disp: , Rfl:  .  naproxen (NAPROSYN) 250 MG tablet, Take 500 mg by mouth 2 (two) times daily with a meal., Disp: , Rfl:  .  omeprazole (PRILOSEC) 40 MG capsule, TAKE 1 CAPSULE BY MOUTH DAILY., Disp: 90 capsule, Rfl: 3 .  ondansetron (ZOFRAN) 4 MG tablet, Take 1 tablet (4 mg total) by mouth every 8 (eight) hours as needed for nausea or vomiting., Disp: 20 tablet, Rfl: 2 .  promethazine (PHENERGAN) 25 MG suppository, Place 1 suppository (25 mg total) rectally every 6 (six) hours as needed for nausea or vomiting., Disp: 12 each, Rfl: 2 .  sildenafil (REVATIO) 20 MG tablet, TAKE 2 TO 5 TABLETS BY MOUTH 30 MINS PRIOR INTERCOURSE, Disp: 50 tablet, Rfl: 11

## 2018-05-16 ENCOUNTER — Encounter: Payer: Self-pay | Admitting: Family Medicine

## 2018-05-16 MED ORDER — CYCLOBENZAPRINE HCL 10 MG PO TABS: 5.0000 mg | ORAL_TABLET | Freq: Three times a day (TID) | ORAL | 0 refills | Status: DC | PRN

## 2018-06-08 DIAGNOSIS — C61 Malignant neoplasm of prostate: Secondary | ICD-10-CM | POA: Diagnosis not present

## 2018-06-15 DIAGNOSIS — C61 Malignant neoplasm of prostate: Secondary | ICD-10-CM | POA: Diagnosis not present

## 2018-06-15 DIAGNOSIS — N5201 Erectile dysfunction due to arterial insufficiency: Secondary | ICD-10-CM | POA: Diagnosis not present

## 2018-11-23 ENCOUNTER — Encounter: Payer: 59 | Admitting: Family Medicine

## 2018-12-13 ENCOUNTER — Other Ambulatory Visit: Payer: Self-pay | Admitting: Family Medicine

## 2018-12-13 ENCOUNTER — Encounter: Payer: Self-pay | Admitting: Internal Medicine

## 2018-12-13 ENCOUNTER — Encounter: Payer: Self-pay | Admitting: Family Medicine

## 2018-12-13 DIAGNOSIS — Z1211 Encounter for screening for malignant neoplasm of colon: Secondary | ICD-10-CM

## 2018-12-17 IMAGING — DX DG CHEST 2V
2 series · 2 of 2 positions shown · non-contrast
Comparison: 07/11/2008

CLINICAL DATA: Prostatectomy.

EXAM:
CHEST  2 VIEW

[chest pa]
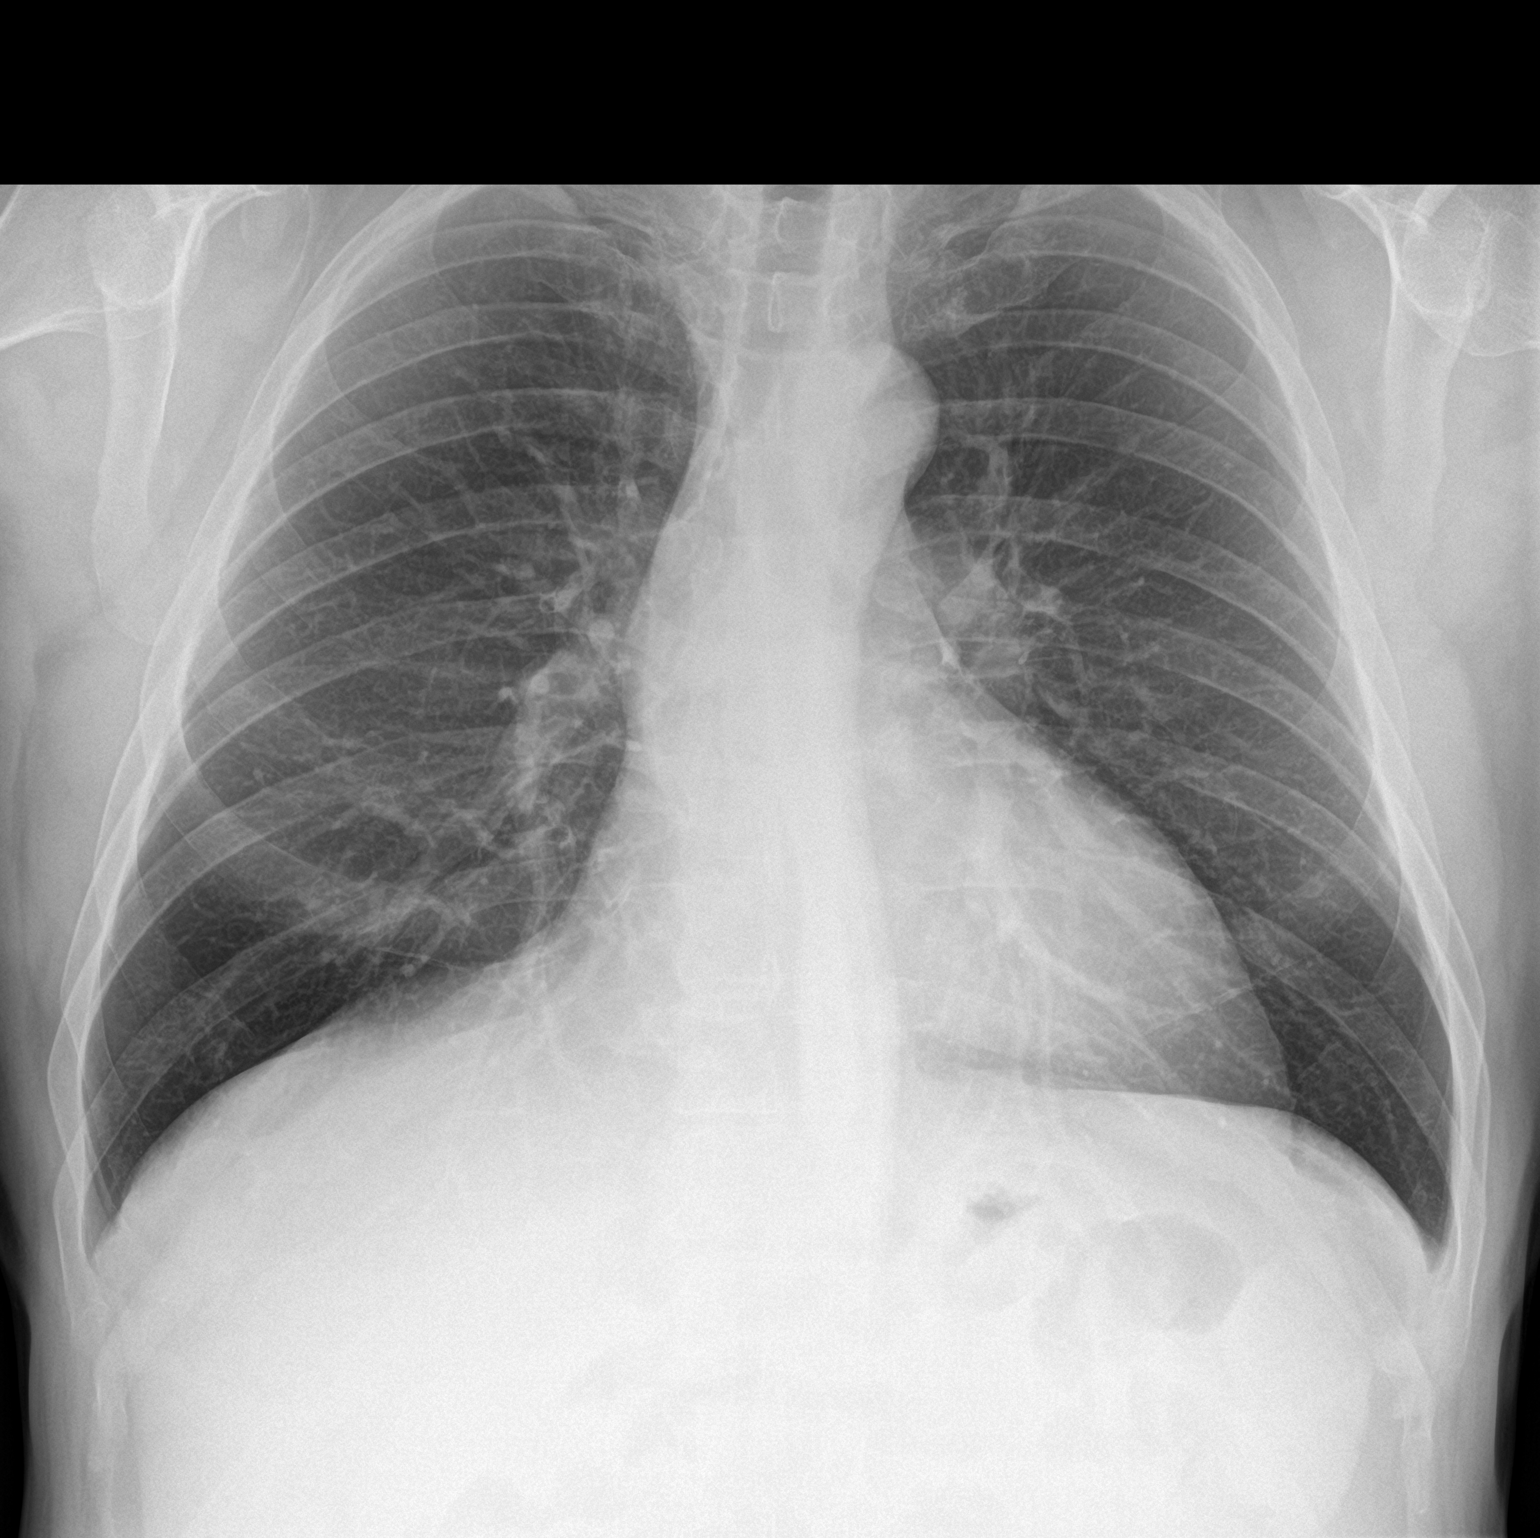

[chest lat]
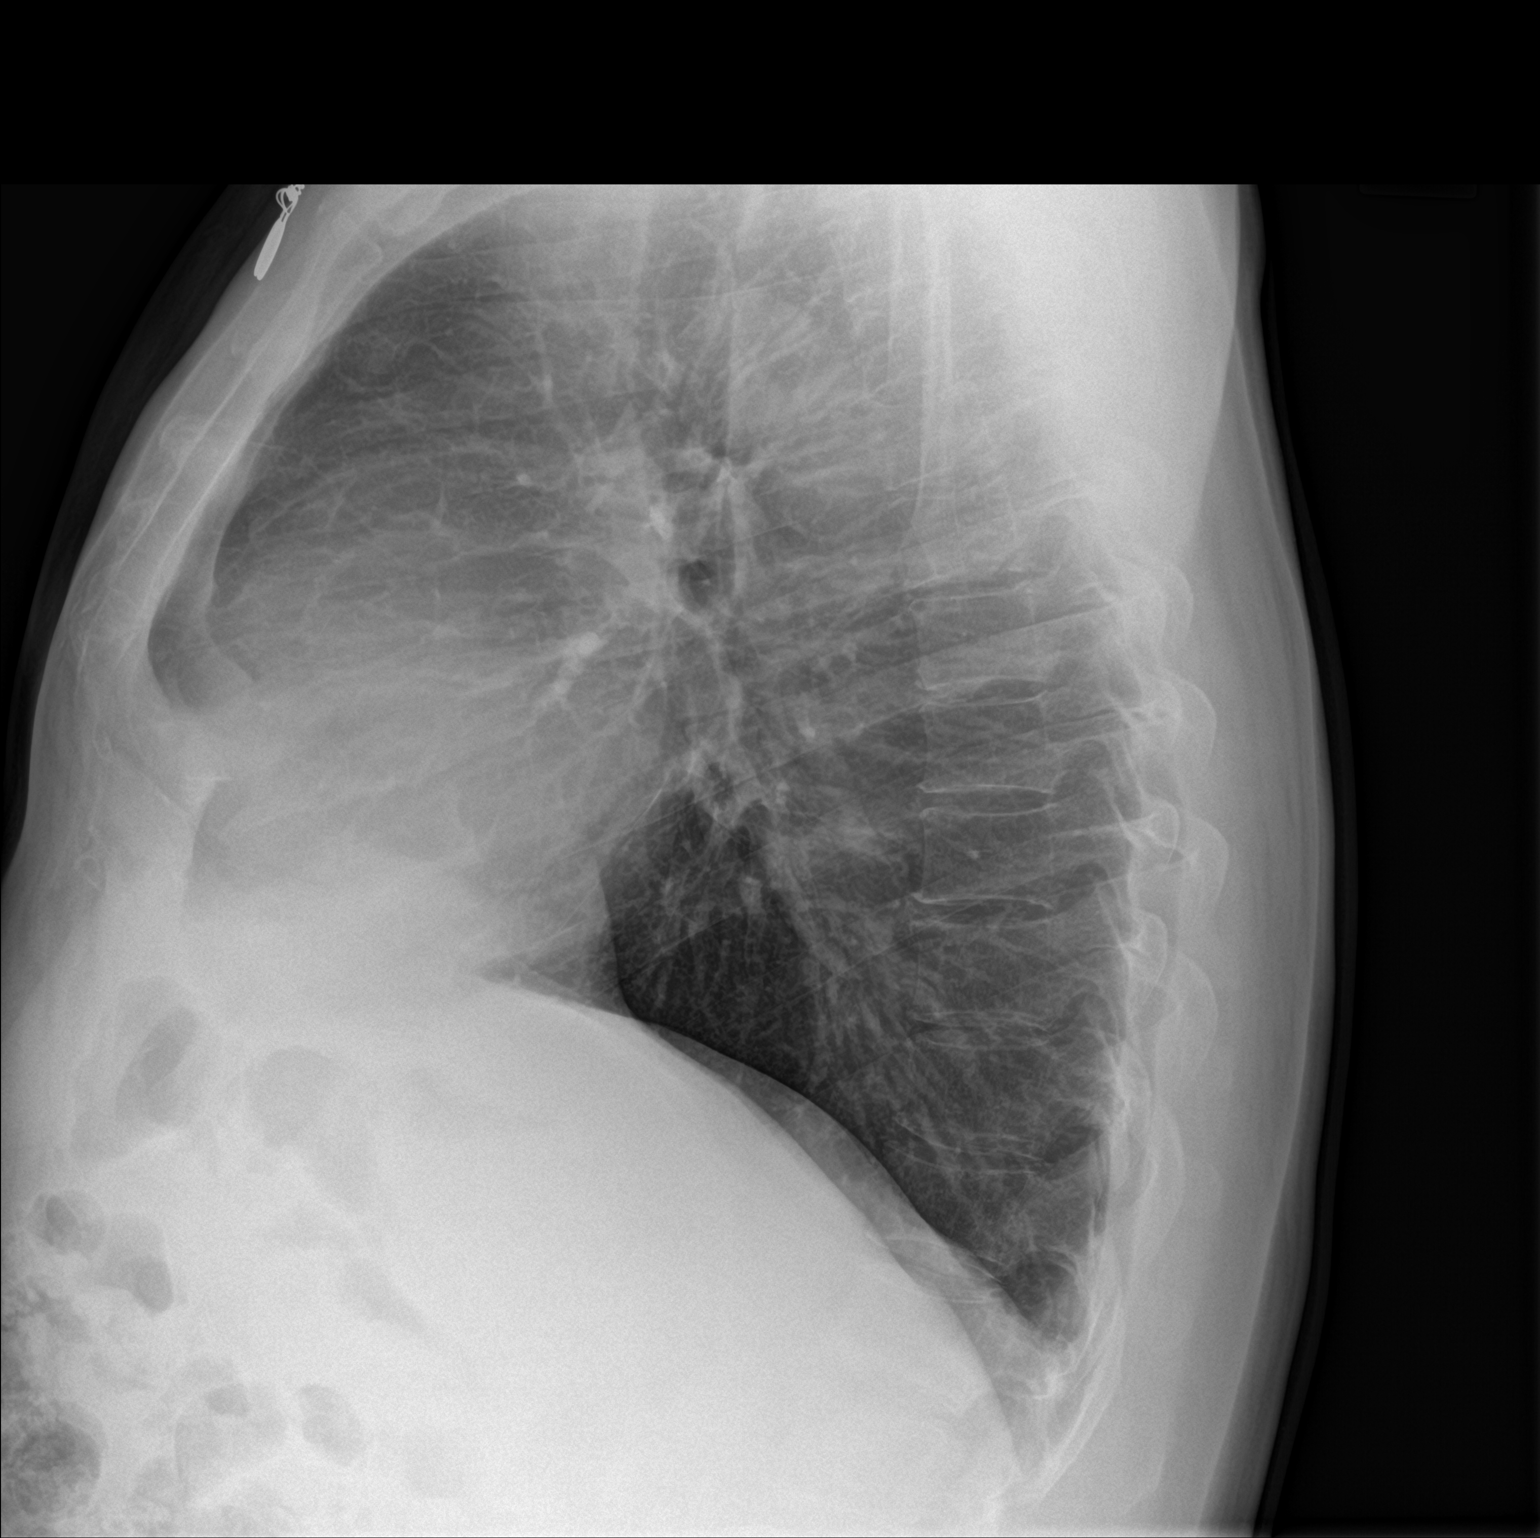

[2 of 2 positions shown; findings below may reference images not displayed]

FINDINGS: Mediastinum hilar structures normal. Lungs are clear. No focal
infiltrate. No pleural effusion or pneumothorax. Nipple shadows
again noted. Cardiomegaly with normal pulmonary vascularity.
Degenerative changes thoracic spine .
IMPRESSION: Cardiomegaly. No pulmonary venous congestion. No acute pulmonary
infiltrate.

## 2018-12-19 ENCOUNTER — Other Ambulatory Visit: Payer: Self-pay

## 2018-12-19 ENCOUNTER — Encounter: Payer: Self-pay | Admitting: Family Medicine

## 2018-12-19 ENCOUNTER — Ambulatory Visit (INDEPENDENT_AMBULATORY_CARE_PROVIDER_SITE_OTHER): Payer: 59 | Admitting: Family Medicine

## 2018-12-19 VITALS — BP 122/72 | HR 105 | Temp 98.3°F | Ht 71.75 in | Wt 209.5 lb

## 2018-12-19 DIAGNOSIS — R55 Syncope and collapse: Secondary | ICD-10-CM

## 2018-12-19 DIAGNOSIS — Z131 Encounter for screening for diabetes mellitus: Secondary | ICD-10-CM | POA: Diagnosis not present

## 2018-12-19 DIAGNOSIS — I10 Essential (primary) hypertension: Secondary | ICD-10-CM

## 2018-12-19 DIAGNOSIS — M2550 Pain in unspecified joint: Secondary | ICD-10-CM | POA: Diagnosis not present

## 2018-12-19 DIAGNOSIS — E782 Mixed hyperlipidemia: Secondary | ICD-10-CM

## 2018-12-19 NOTE — Telephone Encounter (Signed)
Patient left a voicemail stating that he had sent Dr. Lorelei Pont a mychart message and wanted to make sure that he is aware of that.

## 2018-12-19 NOTE — Progress Notes (Signed)
Austin Houston T. Sincere Liuzzi, MD Primary Care and [place] at [facility] [place], [address] Phone: [phone]  FAX: [place] - 58 y.o. male  MRN [id number]  Date of Birth: 30-Jul-1960  Visit Date: 12/19/2018  PCP: Austin Loffler, MD  Referred by: Austin Loffler, MD  Chief Complaint  Patient presents with  . Fatigue    Negative Covid Test  . Nausea  . Dizziness  . Emesis  . Tingling    in fingers   Subjective:   Austin Houston is a 58 y.o. very pleasant male patient who presents with the following:  Got pretty syncopal, the week before, took a trip a week before and took a trip with a trailer.  Stays for a week.  Met up with them at [name]. 80 degrees of weather.  Sunshine and sat around.  Drank some tea and beer and then on Saturday, started to feel kind of bad and achy.  Sunday, woke up and felt like he had been beaten by a basaeball bat. Packed everything up in the truc and   Got some fatigue and shortness of breath and had some 90% humidity.  Ran the cool water on him.  After the shower he started to get quezy Ate a protein bar.   All of the sudden, got really dizzy and things got a little bit blurry and hands started to tingle and laid down on the picnic table.  Thought maybe a little got dehydrated.  Drank a lot of water at the same time.   Wellspaced out.  Was drinking beer.  Drank a liter of water.  Did not feel right and laid down on the bed, got nauseated and wife brough him a bowl. BS was 102 Pulse, 140/80 Got nauseated.  Put his up and put his legs up. Laid up and felt queesy and went away in twenty or thirty minutes.    About thirty minutes later, ok and he drove home.  Was in [place] or past and ate some at [employer]. Ate half of a hamburger.   Nauseated and threw it up again.  Felt really weak and all the way up to his shoulders and up to his hips.  Felt cool,  achy, queesy, felt like something not right.   ? Tick bite? None known.    Had his covid-19 test. Negative - sleeping all the time.  By Thursday, result was negative.  Still was really fatigued  Sat aches were gone, and met his new neightbors. Did some little stuff around the house and felt pretty good on Sunday morning. Cleared to RTW last Saturday.  Yesterday, outisde and a ve came all over him and heart pounding out of his chest.  Took BP and blood sugar.  Was not exerting himself.   Never had any neurological symptoms such as facial drooping, one sided weakness, asphasia, balance disturbance or anything else.   Whole body doxycycline rash in the past.   Past Medical History, Surgical History, Social History, Family History, Problem List, Medications, and Allergies have been reviewed and updated if relevant.  Patient Active Problem List   Diagnosis Date Noted  . 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[place])     Priority: High  . Prostate cancer ([place]) 01/08/2016  . Family history of prostate cancer in father 06/19/2014  . Varicose veins of lower extremities with other complications 72/62/0355  . Former smokeless tobacco use 11/25/2012  .  GERD (gastroesophageal reflux disease)   . Allergic rhinitis due to pollen   . Hypertension   . Hyperlipidemia   . PTSD (post-traumatic stress disorder)     Past Medical History:  Diagnosis Date  . Allergic rhinitis due to pollen   . Asthma    vs COPD- taking inhalers- as related to allergies only.  . Complication of anesthesia    Hypertension only not malignant in nature in teenage yrs after surgery.  Marland Kitchen COPD (chronic obstructive pulmonary disease) (HCC)    vs asthma  . 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 Fracture    left arm age 80-splinted only  . GERD (gastroesophageal reflux disease)   . Hearing impaired person, bilateral    hearing aida bilateral"high frequency loss  .  Hyperlipidemia   . Hypertension   . Prostate cancer ([place]) 01/08/2016  . PTSD (post-traumatic stress disorder)    s/p multiple active deployments for [employer], no medications taken currently    Past Surgical History:  Procedure Laterality Date  . COLONOSCOPY W/ POLYPECTOMY     age 31 "adenoma"  . INGUINAL HERNIA REPAIR     right  . ROBOT ASSISTED LAPAROSCOPIC RADICAL PROSTATECTOMY N/A 07/23/2016   Procedure: XI ROBOTIC ASSISTED LAPAROSCOPIC RADICAL PROSTATECTOMY LEVEL 1;  Surgeon: Raynelle Bring, MD;  Location: [facility];  Service: Urology;  Laterality: N/A;  . THYROIDECTOMY, PARTIAL     [name])    Social History   Socioeconomic History  . Marital status: Married    Spouse name: Not on file  . Number of children: Not on file  . Years of education: Not on file  . Highest education level: Not on file  Occupational History  . Occupation: Cardiac Specialist    Employer: [employer]    Comment: EMTP Cath Lab and Hybrid OR at [employer]  . Financial resource strain: Not on file  . Food insecurity:    Worry: Not on file    Inability: Not on file  . Transportation needs:    Medical: Not on file    Non-medical: Not on file  Tobacco Use  . Smoking status: Never Smoker  . Smokeless tobacco: Former Systems developer    Types: Snuff  Substance and Sexual Activity  . Alcohol use: Yes    Alcohol/week: 10.0 standard drinks    Types: 10 Cans of beer per week    Comment: moderate  . Drug use: No  . Sexual activity: Yes    Partners: Female  Lifestyle  . Physical activity:    Days per week: Not on file    Minutes per session: Not on file  . Stress: Not on file  Relationships  . Social connections:    Talks on phone: Not on file    Gets together: Not on file    Attends religious service: Not on file    Active member of club or organization: Not on file    Attends meetings of clubs or organizations: Not on file    Relationship status: Not on file  . Intimate partner  violence:    Fear of current or ex partner: Not on file    Emotionally abused: Not on file    Physically abused: Not on file    Forced sexual activity: Not on file  Other Topics Concern  . 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   Active duty and Magazine features editor    Family History  Problem Relation Age of Onset  . Heart disease Paternal Grandmother   . Prostate cancer Father        Recurrent x 3  . Hyperlipidemia Father   . Other Father        varicose veins  . Hyperlipidemia Mother   . Other Mother        varicose veins  . Hypertension Maternal Grandmother   . Hypertension Maternal Grandfather   . Sudden death Brother 25  . Colon cancer Neg Hx   . Esophageal cancer Neg Hx   . Rectal cancer Neg Hx   . Stomach cancer Neg Hx     Allergies  Allergen Reactions  . Vioxx [Rofecoxib] Anaphylaxis    Tolerates aspirin and naproxen.  . Celebrex [Celecoxib] Rash    Tolerates aspirin and naproxen.  . Codeine Rash    Patient reports this happened as a child. He has tolerated Codeine since that time  . Doxycycline Rash    Medication list reviewed and updated in full in [place].   GEN: as above GI: No n/v/d, eating normally Pulm: none currently Interactive and getting along well at home. Otherwise, ROS is as per the HPI.  Objective:   BP 122/72   Pulse (!) 105   Temp 98.3 F (36.8 C) (Oral)   Ht 5' 11.75" (1.822 m)   Wt 209 lb 8 oz (95 kg)   SpO2 97%   BMI 28.61 kg/m   GEN: WDWN, NAD, Non-toxic, A & O x 3 HEENT: Atraumatic, Normocephalic. Neck supple. No masses, No LAD. Ears and Nose: No external deformity. CV: RRR, No M/G/R. No JVD. No thrill. No extra heart sounds. PULM: CTA B, no wheezes, crackles, rhonchi. No retractions. No resp. distress. No accessory muscle use. ABD: S, NT, ND, + BS, No rebound, No  HSM  EXTR: No c/c/e NEURO Normal gait.  PSYCH: Normally interactive. Conversant. Not depressed or anxious appearing.  Calm demeanor.   Neuro: CN 2-12 grossly intact. PERRLA. EOMI. Sensation intact throughout. Str 5/5 all extremities. DTR 2+. No clonus. A and o x 4. Romberg neg. Finger nose neg. Heel -shin neg.   Laboratory and Imaging Data:  Assessment and Plan:   Near syncope - Plan: EKG 12-Lead, CBC with Differential/Platelet, Basic metabolic panel, Hepatic function panel, B. burgdorfi antibodies by WB, Rocky mtn spotted fvr abs pnl(IgG+IgM), Ehrlichia antibody panel, TSH, Hemoglobin A1c  Polyarthralgia - Plan: B. burgdorfi antibodies by WB, Rocky mtn spotted fvr abs pnl(IgG+IgM), Ehrlichia antibody panel  Screening for diabetes mellitus - Plan: Hemoglobin A1c  Presyncope recurrent with unclear origin.  Happened twice in a short time period.  Multiple causes possible.  EKG: Normal sinus rhythm. Normal axis, normal R wave progression, No acute ST elevation or depression. Nonspecific ST changes.   Globally ok, non-focal exam.  Check all basic labs, tick labs.  If none are significant, then I think a cardiology consultation would be an appropriate next step.  Follow-up: No follow-ups on file.  No orders of the defined types were placed in this encounter.  Orders Placed This Encounter  Procedures  . CBC with Differential/Platelet  . Basic metabolic panel  . Hepatic function panel  . B. burgdorfi antibodies by WB  . Rocky mtn spotted fvr abs pnl(IgG+IgM)  .  Ehrlichia antibody panel  . TSH  . Hemoglobin A1c  . EKG 12-Lead    Signed,  Elianna Windom T. Lillyan Hitson, MD   Outpatient Encounter Medications as of 12/19/2018  Medication Sig  . tadalafil (CIALIS) 20 MG tablet Take 5-20 mg by mouth daily as needed for erectile dysfunction.  Marland Kitchen albuterol (VENTOLIN HFA) 108 (90 Base) MCG/ACT inhaler INHALE 2 PUFFS INTO THE LUNGS EVERY 6 (SIX) HOURS AS NEEDED FOR WHEEZING.  Marland Kitchen amLODipine (NORVASC)  10 MG tablet TAKE 1 TABLET (10 MG TOTAL) BY MOUTH DAILY.  . beclomethasone (QVAR) 80 MCG/ACT inhaler Inhale 2 puffs into the lungs daily.  . Carboxymethylcellul-Glycerin (LUBRICATING EYE DROPS OP) Apply 1 drop to eye daily as needed (dry eyes).  . cetirizine (ZYRTEC) 10 MG tablet Take 10 mg by mouth daily.  . cyclobenzaprine (FLEXERIL) 10 MG tablet Take 0.5-1 tablets (5-10 mg total) by mouth 3 (three) times daily as needed for muscle spasms.  . fluticasone (FLONASE) 50 MCG/ACT nasal spray Place 2 sprays into both nostrils daily.  Marland Kitchen losartan (COZAAR) 100 MG tablet TAKE 1 TABLET (100 MG TOTAL) BY MOUTH DAILY.  . Multiple Vitamin (MULTIVITAMIN WITH MINERALS) TABS tablet Take 1 tablet by mouth daily.  . naproxen (NAPROSYN) 250 MG tablet Take 500 mg by mouth 2 (two) times daily with a meal.  . omeprazole (PRILOSEC) 40 MG capsule TAKE 1 CAPSULE BY MOUTH DAILY.  Marland Kitchen ondansetron (ZOFRAN) 4 MG tablet Take 1 tablet (4 mg total) by mouth every 8 (eight) hours as needed for nausea or vomiting.  . promethazine (PHENERGAN) 25 MG suppository Place 1 suppository (25 mg total) rectally every 6 (six) hours as needed for nausea or vomiting.  . [DISCONTINUED] sildenafil (REVATIO) 20 MG tablet TAKE 2 TO 5 TABLETS BY MOUTH 30 MINS PRIOR INTERCOURSE   No facility-administered encounter medications on file as of 12/19/2018.

## 2018-12-20 LAB — BASIC METABOLIC PANEL
BUN: 10 mg/dL (ref 6–23)
CO2: 27 mEq/L (ref 19–32)
Calcium: 10.1 mg/dL (ref 8.4–10.5)
Chloride: 101 mEq/L (ref 96–112)
Creatinine, Ser: 0.99 mg/dL (ref 0.40–1.50)
GFR: 78.07 mL/min (ref >60.00)
Glucose, Bld: 90 mg/dL (ref 70–99)
Potassium: 4.4 mEq/L (ref 3.5–5.1)
Sodium: 140 mEq/L (ref 135–145)

## 2018-12-20 LAB — HEPATIC FUNCTION PANEL
ALT: 112 U/L — ABNORMAL HIGH (ref 0–53)
AST: 131 U/L — ABNORMAL HIGH (ref 0–37)
Albumin: 5 g/dL (ref 3.5–5.2)
Alkaline Phosphatase: 69 U/L (ref 39–117)
Bilirubin, Direct: 0.1 mg/dL (ref 0.0–0.3)
Total Bilirubin: 0.6 mg/dL (ref 0.2–1.2)
Total Protein: 7.5 g/dL (ref 6.0–8.3)

## 2018-12-20 LAB — CBC WITH DIFFERENTIAL/PLATELET
Basophils Absolute: 0 10*3/uL (ref 0.0–0.1)
Basophils Relative: 0.9 % (ref 0.0–3.0)
Eosinophils Absolute: 0 10*3/uL (ref 0.0–0.7)
Eosinophils Relative: 1.1 % (ref 0.0–5.0)
HCT: 45.3 % (ref 39.0–52.0)
Hemoglobin: 15.3 g/dL (ref 13.0–17.0)
Lymphocytes Relative: 16.6 % (ref 12.0–46.0)
Lymphs Abs: 0.7 10*3/uL (ref 0.7–4.0)
MCHC: 33.8 g/dL (ref 30.0–36.0)
MCV: 97.7 fl (ref 78.0–100.0)
Monocytes Absolute: 0.4 10*3/uL (ref 0.1–1.0)
Monocytes Relative: 8.9 % (ref 3.0–12.0)
Neutro Abs: 3.2 10*3/uL (ref 1.4–7.7)
Neutrophils Relative %: 72.5 % (ref 43.0–77.0)
Platelets: 275 10*3/uL (ref 150.0–400.0)
RBC: 4.63 Mil/uL (ref 4.22–5.81)
RDW: 12.8 % (ref 11.5–15.5)
WBC: 4.4 10*3/uL (ref 4.0–10.5)

## 2018-12-20 LAB — TSH: TSH: 3.33 u[IU]/mL (ref 0.35–4.50)

## 2018-12-20 LAB — HEMOGLOBIN A1C: Hgb A1c MFr Bld: 5.3 % (ref 4.6–6.5)

## 2018-12-21 LAB — B. BURGDORFI ANTIBODIES BY WB

## 2018-12-21 LAB — EHRLICHIA ANTIBODY PANEL
E. CHAFFEENSIS AB IGG: <1:64 {titer}
E. CHAFFEENSIS AB IGM: <1:20 {titer}

## 2018-12-21 LAB — ROCKY MTN SPOTTED FVR ABS PNL(IGG+IGM)
RMSF IgG: NOT DETECTED
RMSF IgM: NOT DETECTED

## 2018-12-22 DIAGNOSIS — C61 Malignant neoplasm of prostate: Secondary | ICD-10-CM | POA: Diagnosis not present

## 2018-12-30 DIAGNOSIS — N5201 Erectile dysfunction due to arterial insufficiency: Secondary | ICD-10-CM | POA: Diagnosis not present

## 2018-12-30 DIAGNOSIS — C61 Malignant neoplasm of prostate: Secondary | ICD-10-CM | POA: Diagnosis not present

## 2019-01-12 ENCOUNTER — Other Ambulatory Visit: Payer: Self-pay

## 2019-01-12 ENCOUNTER — Ambulatory Visit (AMBULATORY_SURGERY_CENTER): Payer: Self-pay

## 2019-01-12 VITALS — Ht 73.0 in | Wt 205.0 lb

## 2019-01-12 DIAGNOSIS — H524 Presbyopia: Secondary | ICD-10-CM | POA: Diagnosis not present

## 2019-01-12 DIAGNOSIS — Z86010 Personal history of colonic polyps: Secondary | ICD-10-CM

## 2019-01-12 MED ORDER — NA SULFATE-K SULFATE-MG SULF 17.5-3.13-1.6 GM/177ML PO SOLN: 1.0000 | Freq: Once | ORAL | 0 refills | Status: AC

## 2019-01-12 NOTE — Progress Notes (Signed)
Denies allergies to eggs or soy products. Denies complication of anesthesia or sedation. Denies use of weight loss medication. Denies use of O2.   Emmi instructions given for colonoscopy.  Pre-Visit was conducted by phone due to Covid 19. Instructions were reviewed and mailed to patients confirmed home address. A 15.00 coupon for Suprep was given to the patient. Patient was encouraged to call if he had any questions regarding instructions.  

## 2019-01-19 ENCOUNTER — Telehealth: Payer: Self-pay | Admitting: Internal Medicine

## 2019-01-19 NOTE — Telephone Encounter (Signed)
Pt called back. The pt states he was exposed to a COVID positive pt for 45 minutes to an hour, but was wearing double PPE and was following [place] policy. He has not had any symptoms and a COVID test will not be done unless he has symptoms.  He has not been under quarantine and is back at work. Just wanted you to be aware. Please advise. Thanks, Gwyndolyn Saxon in [facility]

## 2019-01-19 NOTE — Telephone Encounter (Signed)
Pt informed that he works for [employer] and will be in contact with a known positive Covid-19 patient.  Pt would like to know if it will be OK to proceed with his procedure.

## 2019-01-19 NOTE — Telephone Encounter (Signed)
Called pt and left message to call our office. Austin Houston in [facility]

## 2019-01-20 ENCOUNTER — Telehealth: Payer: Self-pay | Admitting: Internal Medicine

## 2019-01-20 NOTE — Telephone Encounter (Signed)
Returned patients call regarding procedure on Monday. Patient works in [employer] and was exposed to [place]. Patient has no symptoms. Per Tina Griffiths to proceed with his procedure. Patient was told that if he develops symptoms prior to his procedure to call.   Riki Sheer, LPN ( Admitting )

## 2019-01-20 NOTE — Telephone Encounter (Signed)

## 2019-01-20 NOTE — Telephone Encounter (Signed)
Dr. Hilarie Fredrickson was off yesterday and today;  Please call pt back to advise.  His procedure is on Monday.

## 2019-01-23 ENCOUNTER — Other Ambulatory Visit: Payer: Self-pay

## 2019-01-23 ENCOUNTER — Encounter: Payer: Self-pay | Admitting: Internal Medicine

## 2019-01-23 ENCOUNTER — Ambulatory Visit (AMBULATORY_SURGERY_CENTER): Payer: 59 | Admitting: Internal Medicine

## 2019-01-23 VITALS — BP 123/81 | HR 62 | Temp 98.0°F | Resp 12 | Ht 73.0 in | Wt 205.0 lb

## 2019-01-23 DIAGNOSIS — J449 Chronic obstructive pulmonary disease, unspecified: Secondary | ICD-10-CM | POA: Diagnosis not present

## 2019-01-23 DIAGNOSIS — Z860100 Personal history of colon polyps, unspecified: Secondary | ICD-10-CM

## 2019-01-23 DIAGNOSIS — D122 Benign neoplasm of ascending colon: Secondary | ICD-10-CM | POA: Diagnosis not present

## 2019-01-23 DIAGNOSIS — Z1211 Encounter for screening for malignant neoplasm of colon: Secondary | ICD-10-CM | POA: Diagnosis not present

## 2019-01-23 DIAGNOSIS — I10 Essential (primary) hypertension: Secondary | ICD-10-CM | POA: Diagnosis not present

## 2019-01-23 DIAGNOSIS — D120 Benign neoplasm of cecum: Secondary | ICD-10-CM

## 2019-01-23 MED ORDER — SODIUM CHLORIDE 0.9 % IV SOLN: 500.0000 mL | Freq: Once | INTRAVENOUS | Status: DC

## 2019-01-23 NOTE — Patient Instructions (Signed)
Impression/Recommendations:  Polyp handout given to patient. Diverticulosis handout given to patient.  Resume previous diet. Continue present medications. Await pathology results.  Repeat colonoscopy recommended for surveillance.  Date to be determined after pathology results reviewed.  YOU HAD AN ENDOSCOPIC PROCEDURE TODAY AT THE [facility]:   Refer to the procedure report that was given to you for any specific questions about what was found during the examination.  If the procedure report does not answer your questions, please call your gastroenterologist to clarify.  If you requested that your care partner not be given the details of your procedure findings, then the procedure report has been included in a sealed envelope for you to review at your convenience later.  YOU SHOULD EXPECT: Some feelings of bloating in the abdomen. Passage of more gas than usual.  Walking can help get rid of the air that was put into your GI tract during the procedure and reduce the bloating. If you had a lower endoscopy (such as a colonoscopy or flexible sigmoidoscopy) you may notice spotting of blood in your stool or on the toilet paper. If you underwent a bowel prep for your procedure, you may not have a normal bowel movement for a few days.  Please Note:  You might notice some irritation and congestion in your nose or some drainage.  This is from the oxygen used during your procedure.  There is no need for concern and it should clear up in a day or so.  SYMPTOMS TO REPORT IMMEDIATELY:   Following lower endoscopy (colonoscopy or flexible sigmoidoscopy):  Excessive amounts of blood in the stool  Significant tenderness or worsening of abdominal pains  Swelling of the abdomen that is new, acute  Fever of 100F or higher For urgent or emergent issues, a gastroenterologist can be reached at any hour by calling [phone].   DIET:  We do recommend a small meal at first, but then you may  proceed to your regular diet.  Drink plenty of fluids but you should avoid alcoholic beverages for 24 hours.  ACTIVITY:  You should plan to take it easy for the rest of today and you should NOT DRIVE or use heavy machinery until tomorrow (because of the sedation medicines used during the test).    FOLLOW UP: Our staff will call the number listed on your records 48-72 hours following your procedure to check on you and address any questions or concerns that you may have regarding the information given to you following your procedure. If we do not reach you, we will leave a message.  We will attempt to reach you two times.  During this call, we will ask if you have developed any symptoms of COVID 19. If you develop any symptoms (ie: fever, flu-like symptoms, shortness of breath, cough etc.) before then, please call [phone].  If you test positive for Covid 19 in the 2 weeks post procedure, please call and report this information to us.    If any biopsies were taken you will be contacted by phone or by letter within the next 1-3 weeks.  Please call us at [phone] if you have not heard about the biopsies in 3 weeks.    SIGNATURES/CONFIDENTIALITY: You and/or your care partner have signed paperwork which will be entered into your electronic medical record.  These signatures attest to the fact that that the information above on your After Visit Summary has been reviewed and is understood.  Full responsibility of the confidentiality of   this discharge information lies with you and/or your care-partner.   

## 2019-01-23 NOTE — Progress Notes (Signed)
Called to room to assist during endoscopic procedure.  Patient ID and intended procedure confirmed with present staff. Received instructions for my participation in the procedure from the performing physician.  

## 2019-01-23 NOTE — Op Note (Signed)
[facility] Patient Name: Austin Houston Procedure Date: 01/23/2019 7:23 AM MRN: [id number] Endoscopist: Jerene Bears , MD Age: 58 Referring MD:  Date of Birth: 1961-01-26 Gender: Male Account #: [id number] Procedure:                Colonoscopy Indications:              High risk colon cancer surveillance: Personal                            history of non-advanced adenoma, Last colonoscopy 5                            years ago Medicines:                Monitored Anesthesia Care Procedure:                Pre-Anesthesia Assessment:                           - Prior to the procedure, a History and Physical                            was performed, and patient medications and                            allergies were reviewed. The patient's tolerance of                            previous anesthesia was also reviewed. The risks                            and benefits of the procedure and the sedation                            options and risks were discussed with the patient.                            All questions were answered, and informed consent                            was obtained. Prior Anticoagulants: The patient has                            taken no previous anticoagulant or antiplatelet                            agents. ASA Grade Assessment: II - A patient with                            mild systemic disease. After reviewing the risks                            and benefits, the patient was deemed in  satisfactory condition to undergo the procedure.                           After obtaining informed consent, the colonoscope                            was passed under direct vision. Throughout the                            procedure, the patient's blood pressure, pulse, and                            oxygen saturations were monitored continuously. The                            Colonoscope was introduced through the anus and                        advanced to the cecum, identified by appendiceal                            orifice and ileocecal valve. The colonoscopy was                            performed without difficulty. The patient tolerated                            the procedure well. The quality of the bowel                            preparation was good. Scope In: 7:45:45 AM Scope Out: 8:01:13 AM Scope Withdrawal Time: 0 hours 10 minutes 48 seconds  Total Procedure Duration: 0 hours 15 minutes 28 seconds  Findings:                 The digital rectal exam was normal.                           A 2 mm polyp was found in the cecum. The polyp was                            sessile. The polyp was removed with a cold snare.                            Resection and retrieval were complete.                           A 4 mm polyp was found in the ascending colon. The                            polyp was sessile. The polyp was removed with a                            cold snare. Resection and retrieval were complete.  Multiple small and large-mouthed diverticula were                            found in the sigmoid colon, descending colon,                            hepatic flexure and ascending colon.                           The retroflexed view of the distal rectum and anal                            verge was normal and showed no anal or rectal                            abnormalities. Complications:            No immediate complications. Estimated Blood Loss:     Estimated blood loss was minimal. Impression:               - One 2 mm polyp in the cecum, removed with a cold                            snare. Resected and retrieved.                           - One 4 mm polyp in the ascending colon, removed                            with a cold snare. Resected and retrieved.                           - Diverticulosis in the sigmoid colon, in the                            descending  colon, at the hepatic flexure and in the                            ascending colon.                           - The distal rectum and anal verge are normal on                            retroflexion view. Recommendation:           - Patient has a contact number available for                            emergencies. The signs and symptoms of potential                            delayed complications were discussed with the  patient. Return to normal activities tomorrow.                            Written discharge instructions were provided to the                            patient.                           - Resume previous diet.                           - Continue present medications.                           - Await pathology results.                           - Repeat colonoscopy is recommended for                            surveillance. The colonoscopy date will be                            determined after pathology results from today's                            exam become available for review. Jerene Bears, MD 01/23/2019 8:04:49 AM This report has been signed electronically.

## 2019-01-23 NOTE — Progress Notes (Signed)
Report given to PACU, vss 

## 2019-01-25 ENCOUNTER — Telehealth: Payer: Self-pay

## 2019-01-25 NOTE — Telephone Encounter (Signed)
No answer, left message to call back if having any issues or concerns, B.Amberlin Utke RN. 

## 2019-01-25 NOTE — Telephone Encounter (Signed)
No answer, left message to call back later today, B.Antonie Borjon RN. 

## 2019-01-30 ENCOUNTER — Encounter: Payer: Self-pay | Admitting: Internal Medicine

## 2019-01-31 ENCOUNTER — Telehealth: Payer: 59 | Admitting: Cardiovascular Disease

## 2019-02-01 ENCOUNTER — Telehealth: Payer: 59 | Admitting: Cardiovascular Disease

## 2019-02-23 ENCOUNTER — Encounter: Payer: 59 | Admitting: Family Medicine

## 2019-03-15 ENCOUNTER — Encounter: Payer: 59 | Admitting: Family Medicine

## 2019-04-10 ENCOUNTER — Other Ambulatory Visit: Payer: Self-pay | Admitting: Family Medicine

## 2019-05-01 ENCOUNTER — Ambulatory Visit: Payer: 59 | Admitting: Family Medicine

## 2019-05-24 ENCOUNTER — Ambulatory Visit: Payer: 59 | Admitting: Family Medicine

## 2019-06-21 ENCOUNTER — Other Ambulatory Visit: Payer: Self-pay

## 2019-06-21 ENCOUNTER — Encounter: Payer: Self-pay | Admitting: Family Medicine

## 2019-06-21 ENCOUNTER — Ambulatory Visit (INDEPENDENT_AMBULATORY_CARE_PROVIDER_SITE_OTHER): Payer: 59 | Admitting: Family Medicine

## 2019-06-21 VITALS — BP 126/80 | HR 89 | Temp 97.7°F | Ht 72.5 in | Wt 203.5 lb

## 2019-06-21 DIAGNOSIS — Z0000 Encounter for general adult medical examination without abnormal findings: Secondary | ICD-10-CM

## 2019-06-21 NOTE — Progress Notes (Signed)
Austin Mcgillis T. Mertie Haslem, MD Primary Care and [place] at [facility] [place], [address] Phone: [phone]  FAX: [place] - 58 y.o. male  MRN [id number]  Date of Birth: 1960/08/24  Visit Date: 06/21/2019  PCP: Owens Loffler, MD  Referred by: Owens Loffler, MD  Chief Complaint  Patient presents with  . Annual Exam   This visit occurred during the SARS-CoV-2 public health emergency.  Safety protocols were in place, including screening questions prior to the visit, additional usage of staff PPE, and extensive cleaning of exam room while observing appropriate contact time as indicated for disinfecting solutions.   This note is not being shared with the patient for the following reason: To respect privacy (The patient or proxy has requested that the information not be shared).  I honestly do not know how to correct this in [facility].  This is not really the reason that it is not been shared.  Patient Care Team: Owens Loffler, MD as PCP - General (Family Medicine) Subjective:   Austin Houston is a 58 y.o. pleasant patient who presents with the following:  Preventative Health Maintenance Visit:  Health Maintenance Summary Reviewed and updated, unless pt declines services.  Tobacco History Reviewed. Alcohol: No concerns, no excessive use - 2-3 about four or five a weeks.  Exercise Habits: Some activity, rec at least 30 mins 5 times a week STD concerns: no risk or activity to increase risk Drug Use: None  Doing better since lasast year whn his daddied last year.  Pulled up an EKG from 2012.  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  Could have been a lot of things.  Has been fine since then. Kept out of work for a couple of.  Wife's niece had a runny nose, came back negative. Feeling fine now.   Lost weight and now is up to 204.   Question about his right shoulder and killing him a  month ago. No injury at all.  Built a swing set for his grandson.  May have done it then.  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  Did some heat and cold.  Did some cuff exercises.  Pain behind his knee.  Pain in pop area.  Has been doin some rehab.    Colon in July.  Removed 2 polyps.  Urology and follow-up for 2 years.  PSA is non-detectable.  Has been using cialis and tolerating it better.   Try the daily 5 mg.  5 mg is working well. Yolanda Bonine is 28 pounds.   Health Maintenance  Topic Date Due  . COLONOSCOPY  01/22/2026  . TETANUS/TDAP  10/22/2026  . INFLUENZA VACCINE  Completed  . Hepatitis C Screening  Completed  . HIV Screening  Completed   Immunization History  Administered Date(s) Administered  . Influenza Split 09/10/2016  . Influenza, Quadrivalent, Recombinant, Inj, Pf 04/13/2018  . Influenza,inj,Quad PF,6+ Mos 04/13/2015  . Influenza-Unspecified 03/31/2017, 04/14/2019  . Tdap 10/21/2016   Patient Active Problem List   Diagnosis Date Noted  . 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 Prostate cancer ([place]) 01/08/2016  . Family history of prostate cancer in father 06/19/2014  . Varicose veins of lower extremities with other complications [id number]  . Former smokeless tobacco use 11/25/2012  . GERD (gastroesophageal reflux disease)   . Allergic rhinitis due to pollen   . Hypertension   . Hyperlipidemia   .  PTSD (post-traumatic stress disorder)    Past Medical History:  Diagnosis Date  . Allergic rhinitis due to pollen   . Allergy   . Asthma    vs COPD- taking inhalers- as related to allergies only.  . Complication of anesthesia    Hypertension only not malignant in nature in teenage yrs after surgery.  Marland Kitchen COPD (chronic obstructive pulmonary disease) (H[facility])    vs asthma  . Follicular cancer of thyroid [facility])    s/p partial thyroidectomy (follicular adenoma)-surgery only  . Fracture    left arm age 32-splinted only  . GERD (gastroesophageal reflux disease)    . Hearing impaired person, bilateral    hearing aida bilateral"high frequency loss  . Hyperlipidemia   . Hypertension   . Prostate cancer ([place]) 01/08/2016  . PTSD (post-traumatic stress disorder)    s/p multiple active deployments for [employer], no medications taken currently   Past Surgical History:  Procedure Laterality Date  . COLONOSCOPY W/ POLYPECTOMY     age 39 "adenoma"  . INGUINAL HERNIA REPAIR     right  . ROBOT ASSISTED LAPAROSCOPIC RADICAL PROSTATECTOMY N/A 07/23/2016   Procedure: XI ROBOTIC ASSISTED LAPAROSCOPIC RADICAL PROSTATECTOMY LEVEL 1;  Surgeon: Raynelle Bring, MD;  Location: [facility];  Service: Urology;  Laterality: N/A;  . THYROIDECTOMY, PARTIAL     [name])   Social History   Socioeconomic History  . Marital status: Married    Spouse name: Not on file  . Number of children: Not on file  . Years of education: Not on file  . Highest education level: Not on file  Occupational History  . 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 Smoking status: Never Smoker  . Smokeless tobacco: Former Systems developer    Types: Snuff  Substance and Sexual Activity  . Alcohol use: Yes    Alcohol/week: 10.0 standard drinks    Types: 10 Cans of beer per week    Comment: moderate  . Drug use: No  . Sexual activity: Yes    Partners: Female  Other Topics Concern  . 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   Active duty and Magazine features editor   Social Determinants of Health   Financial Resource Strain:   . Difficulty of Paying Living Expenses: Not on file  Food Insecurity:   . Worried About Charity fundraiser in the Last Year: Not on file  . Ran Out of Food in the Last Year: Not on file  Transportation Needs:   . Lack of Transportation (Medical):  Not on file  . Lack of Transportation (Non-Medical): Not on file  Physical Activity:   . Days of Exercise per Week: Not on file  . Minutes of Exercise per Session: Not on file  Stress:   . Feeling of Stress : Not on file  Social Connections:   . Frequency of Communication with Friends and Family: Not on file  . Frequency of Social Gatherings with Friends and Family: Not on file  . Attends Religious Services: Not on file  . Active Member of Clubs or Organizations: Not on file  . Attends Archivist Meetings: Not on file  . Marital Status:  Not on file  Intimate Partner Violence:   . Fear of Current or Ex-Partner: Not on file  . Emotionally Abused: Not on file  . Physically Abused: Not on file  . Sexually Abused: Not on file   Family History  Problem Relation Age of Onset  . Heart disease Paternal Grandmother   . Prostate cancer Father        Recurrent x 3  . Hyperlipidemia Father   . Other Father        varicose veins  . Hyperlipidemia Mother   . Other Mother        varicose veins  . Hypertension Maternal Grandmother   . Hypertension Maternal Grandfather   . Sudden death Brother 57  . Colon cancer Neg Hx   . Esophageal cancer Neg Hx   . Rectal cancer Neg Hx   . Stomach cancer Neg Hx    Allergies  Allergen Reactions  . Vioxx [Rofecoxib] Anaphylaxis    Tolerates aspirin and naproxen.  . Celebrex [Celecoxib] Rash    Tolerates aspirin and naproxen.  . Codeine Rash    Patient reports this happened as a child. He has tolerated Codeine since that time  . Doxycycline Rash    Medication list has been reviewed and updated.   General: Denies fever, chills, sweats. No significant weight loss. Eyes: Denies blurring,significant itching ENT: Denies earache, sore throat, and hoarseness. Cardiovascular: Denies chest pains, palpitations, dyspnea on exertion Respiratory: Denies cough, dyspnea at rest,wheeezing Breast: no concerns about lumps GI: Denies nausea, vomiting,  diarrhea, constipation, change in bowel habits, abdominal pain, melena, hematochezia GU: Denies penile discharge, ED, urinary flow / outflow problems. No STD concerns. Musculoskeletal: As above.  Shoulder, sciatica. Derm: Denies rash, itching Neuro: Denies  paresthesias, frequent falls, frequent headaches Psych: Denies depression, anxiety Endocrine: Denies cold intolerance, heat intolerance, polydipsia Heme: Denies enlarged lymph nodes Allergy: No hayfever  Objective:   BP 126/80   Pulse 89   Temp 97.7 F (36.5 C) (Temporal)   Ht 6' 0.5" (1.842 m)   Wt 203 lb 8 oz (92.3 kg)   SpO2 98%   BMI 27.22 kg/m  Ideal Body Weight: Weight in (lb) to have BMI = 25: 186.5  Ideal Body Weight: Weight in (lb) to have BMI = 25: 186.5 No exam data present Depression screen [facility] 2/9 06/21/2019 12/16/2017  Decreased Interest 0 1  Down, Depressed, Hopeless 0 1  PHQ - 2 Score 0 2  Altered sleeping - 1  Tired, decreased energy - 1  Change in appetite - 1  Feeling bad or failure about yourself  - 0  Trouble concentrating - 1  Moving slowly or fidgety/restless - 1  Suicidal thoughts - 0  PHQ-9 Score - 7     GEN: well developed, well nourished, no acute distress Eyes: conjunctiva and lids normal, PERRLA, EOMI ENT: TM clear, nares clear, oral exam WNL Neck: supple, no lymphadenopathy, no thyromegaly, no JVD Pulm: clear to auscultation and percussion, respiratory effort normal CV: regular rate and rhythm, S1-S2, no murmur, rub or gallop, no bruits, peripheral pulses normal and symmetric, no cyanosis, clubbing, edema or varicosities GI: soft, non-tender; no hepatosplenomegaly, masses; active bowel sounds all quadrants GU: no hernia, testicular mass, penile discharge Lymph: no cervical, axillary or inguinal adenopathy  Shoulder: R Inspection: No muscle wasting or winging Ecchymosis/edema: neg  AC joint, scapula, clavicle: NT Cervical spine: NT, full ROM Spurling's: neg Abduction: full, 5/5  Flexion: full, 5/5 IR, full, lift-off: 5/5 ER at  neutral: full, 5/5 AC crossover: neg Neer: pos Hawkins: pos Drop Test: neg Empty Can: pos Supraspinatus insertion: mild-mod T Bicipital groove: NT Speed's: neg Yergason's: neg Sulcus sign: neg Scapular dyskinesis: none C5-T1 intact  Neuro: Sensation intact Grip 5/5   Back:  Range of motion at  the waist: Flexion, extension, lateral bending and rotation: Grossly full range of motion in all directions.  No echymosis or edema Rises to examination table with mild difficulty Gait: minimally antalgic  Inspection/Deformity: N Paraspinus Tenderness: Relatively minor bilaterally  B Ankle Dorsiflexion (L5,4): 5/5 B Great Toe Dorsiflexion (L5,4): 5/5 Heel Walk (L5): WNL Toe Walk (S1): WNL Rise/Squat (L4): WNL, mild pain  SENSORY B Medial Foot (L4): WNL B Dorsum (L5): WNL B Lateral (S1): WNL Light Touch: WNL Pinprick: WNL  REFLEXES Knee (L4): 2+ Ankle (S1): 2+  B SLR, seated: neg B SLR, supine: neg B FABER: neg B Reverse FABER: neg B Greater Troch: NT B Log Roll: neg B Stork: NT B Sciatic Notch: NT   SKIN: clear, good turgor, color WNL, no rashes, lesions, or ulcerations Neuro: normal mental status, normal strength, sensation, and motion Psych: alert; oriented to person, place and time, normally interactive and not anxious or depressed in appearance. All labs reviewed with patient.  These are pending for this annual exam.  Assessment and Plan:     ICD-10-CM   1. Healthcare maintenance  123456 Basic metabolic panel    CBC with Differential    Hepatic function panel    Lipid panel    Hemoglobin A1c   Continue to work on weight loss and fitness.  He does have some rotator cuff impingement and have some sciatica with a component of piriformis.  I gave him some rehab to augment what he is already doing.  Health Maintenance Exam: The patient's preventative maintenance and recommended screening tests for an  annual wellness exam were reviewed in full today. Brought up to date unless services declined.  Counselled on the importance of diet, exercise, and its role in overall health and mortality. The patient's FH and SH was reviewed, including their home life, tobacco status, and drug and alcohol status.  Follow-up in 1 year for physical exam or additional follow-up below.  Follow-up: No follow-ups on file. Or follow-up in 1 year if not noted.  No orders of the defined types were placed in this encounter.  Medications Discontinued During This Encounter  Medication Reason  . tadalafil (CIALIS) 20 MG tablet Change in therapy   Orders Placed This Encounter  Procedures  . Basic metabolic panel  . CBC with Differential  . Hepatic function panel  . Lipid panel  . Hemoglobin A1c    Signed,  Arush Gatliff T. Neil Brickell, MD   Allergies as of 06/21/2019      Reactions   Vioxx [rofecoxib] Anaphylaxis   Tolerates aspirin and naproxen.   Celebrex [celecoxib] Rash   Tolerates aspirin and naproxen.   Codeine Rash   Patient reports this happened as a child. He has tolerated Codeine since that time   Doxycycline Rash      Medication List       Accurate as of June 21, 2019 11:59 PM. If you have any questions, ask your nurse or doctor.        albuterol 108 (90 Base) MCG/ACT inhaler Commonly known as: Ventolin HFA INHALE 2 PUFFS INTO THE LUNGS EVERY 6 (SIX) HOURS AS NEEDED FOR WHEEZING.   amLODipine 10 MG tablet Commonly known as: NORVASC TAKE 1  TABLET (10 MG TOTAL) BY MOUTH DAILY.   beclomethasone 80 MCG/ACT inhaler Commonly known as: QVAR Inhale 2 puffs into the lungs daily.   cetirizine 10 MG tablet Commonly known as: ZYRTEC Take 10 mg by mouth daily.   cyclobenzaprine 10 MG tablet Commonly known as: FLEXERIL Take 0.5-1 tablets (5-10 mg total) by mouth 3 (three) times daily as needed for muscle spasms.   fluticasone 50 MCG/ACT nasal spray Commonly known as: FLONASE Place 2  sprays into both nostrils daily.   losartan 100 MG tablet Commonly known as: COZAAR TAKE 1 TABLET (100 MG TOTAL) BY MOUTH DAILY.   LUBRICATING EYE DROPS OP Apply 1 drop to eye daily as needed (dry eyes).   multivitamin with minerals Tabs tablet Take 1 tablet by mouth daily.   naproxen 250 MG tablet Commonly known as: NAPROSYN Take 500 mg by mouth 2 (two) times daily with a meal.   omeprazole 40 MG capsule Commonly known as: PRILOSEC TAKE 1 CAPSULE BY MOUTH DAILY.   ondansetron 4 MG tablet Commonly known as: ZOFRAN Take 1 tablet (4 mg total) by mouth every 8 (eight) hours as needed for nausea or vomiting.   promethazine 25 MG suppository Commonly known as: PHENERGAN Place 1 suppository (25 mg total) rectally every 6 (six) hours as needed for nausea or vomiting.   tadalafil 5 MG tablet Commonly known as: CIALIS Take 5 mg by mouth daily. What changed: Another medication with the same name was removed. Continue taking this medication, and follow the directions you see here. Changed by: Owens Loffler, MD

## 2019-06-22 LAB — HEPATIC FUNCTION PANEL
ALT: 50 U/L (ref 0–53)
AST: 57 U/L — ABNORMAL HIGH (ref 0–37)
Albumin: 4.4 g/dL (ref 3.5–5.2)
Alkaline Phosphatase: 54 U/L (ref 39–117)
Bilirubin, Direct: 0.2 mg/dL (ref 0.0–0.3)
Total Bilirubin: 0.8 mg/dL (ref 0.2–1.2)
Total Protein: 6.9 g/dL (ref 6.0–8.3)

## 2019-06-22 LAB — BASIC METABOLIC PANEL
BUN: 17 mg/dL (ref 6–23)
CO2: 27 mEq/L (ref 19–32)
Calcium: 9.8 mg/dL (ref 8.4–10.5)
Chloride: 103 mEq/L (ref 96–112)
Creatinine, Ser: 1.13 mg/dL (ref 0.40–1.50)
GFR: 66.9 mL/min (ref >60.00)
Glucose, Bld: 94 mg/dL (ref 70–99)
Potassium: 4.6 mEq/L (ref 3.5–5.1)
Sodium: 139 mEq/L (ref 135–145)

## 2019-06-22 LAB — HEMOGLOBIN A1C: Hgb A1c MFr Bld: 4.9 % (ref 4.6–6.5)

## 2019-06-22 LAB — CBC WITH DIFFERENTIAL/PLATELET
Basophils Absolute: 0.1 10*3/uL (ref 0.0–0.1)
Basophils Relative: 1.1 % (ref 0.0–3.0)
Eosinophils Absolute: 0.2 10*3/uL (ref 0.0–0.7)
Eosinophils Relative: 2.9 % (ref 0.0–5.0)
HCT: 43.9 % (ref 39.0–52.0)
Hemoglobin: 14.8 g/dL (ref 13.0–17.0)
Lymphocytes Relative: 16.5 % (ref 12.0–46.0)
Lymphs Abs: 0.9 10*3/uL (ref 0.7–4.0)
MCHC: 33.7 g/dL (ref 30.0–36.0)
MCV: 97.9 fl (ref 78.0–100.0)
Monocytes Absolute: 0.5 10*3/uL (ref 0.1–1.0)
Monocytes Relative: 9.6 % (ref 3.0–12.0)
Neutro Abs: 3.7 10*3/uL (ref 1.4–7.7)
Neutrophils Relative %: 69.9 % (ref 43.0–77.0)
Platelets: 279 10*3/uL (ref 150.0–400.0)
RBC: 4.49 Mil/uL (ref 4.22–5.81)
RDW: 12.8 % (ref 11.5–15.5)
WBC: 5.4 10*3/uL (ref 4.0–10.5)

## 2019-06-22 LAB — LIPID PANEL
Cholesterol: 203 mg/dL — ABNORMAL HIGH (ref 0–200)
HDL: 102.9 mg/dL (ref >39.00)
LDL Cholesterol: 81 mg/dL (ref 0–99)
NonHDL: 99.67
Total CHOL/HDL Ratio: 2
Triglycerides: 94 mg/dL (ref 0.0–149.0)
VLDL: 18.8 mg/dL (ref 0.0–40.0)

## 2019-06-30 DIAGNOSIS — H903 Sensorineural hearing loss, bilateral: Secondary | ICD-10-CM | POA: Diagnosis not present

## 2019-07-13 DIAGNOSIS — Z760 Encounter for issue of repeat prescription: Secondary | ICD-10-CM | POA: Diagnosis not present

## 2019-08-28 DIAGNOSIS — C61 Malignant neoplasm of prostate: Secondary | ICD-10-CM | POA: Diagnosis not present

## 2019-09-06 DIAGNOSIS — C61 Malignant neoplasm of prostate: Secondary | ICD-10-CM | POA: Diagnosis not present

## 2019-09-06 DIAGNOSIS — N5201 Erectile dysfunction due to arterial insufficiency: Secondary | ICD-10-CM | POA: Diagnosis not present

## 2019-09-11 ENCOUNTER — Encounter: Payer: Self-pay | Admitting: Family Medicine

## 2019-09-12 NOTE — Telephone Encounter (Signed)
Left message asking pt to call office  °

## 2019-09-12 NOTE — Telephone Encounter (Signed)
Appointment 3/3

## 2019-09-12 NOTE — Telephone Encounter (Signed)
Shirlean Mylar, can you help him set up an appointment around his schedule?

## 2019-09-13 ENCOUNTER — Encounter: Payer: Self-pay | Admitting: Family Medicine

## 2019-09-13 ENCOUNTER — Ambulatory Visit: Payer: 59 | Admitting: Family Medicine

## 2019-09-13 ENCOUNTER — Other Ambulatory Visit: Payer: Self-pay

## 2019-09-13 VITALS — BP 120/74 | HR 73 | Temp 98.1°F | Ht 72.5 in | Wt 206.8 lb

## 2019-09-13 DIAGNOSIS — M7551 Bursitis of right shoulder: Secondary | ICD-10-CM | POA: Diagnosis not present

## 2019-09-13 DIAGNOSIS — M25511 Pain in right shoulder: Secondary | ICD-10-CM

## 2019-09-13 DIAGNOSIS — M7541 Impingement syndrome of right shoulder: Secondary | ICD-10-CM

## 2019-09-13 MED ORDER — METHYLPREDNISOLONE ACETATE 40 MG/ML IJ SUSP
80.0000 mg | Freq: Once | INTRAMUSCULAR | Status: AC
  Administered 2019-09-13: 80 mg via INTRA_ARTICULAR

## 2019-09-13 NOTE — Addendum Note (Signed)
Addended by: Carter Kitten on: 09/13/2019 10:12 AM   Modules accepted: Orders

## 2019-09-13 NOTE — Progress Notes (Signed)
Austin Beaumont T. Ferne Ellingwood, MD Primary Care and [place] at [facility] [place], [address] Phone: [phone]  FAX: Austin Houston - 59 y.o. male  MRN [id number]  Date of Birth: 1960/10/19  Visit Date: 09/13/2019  PCP: Austin Loffler, MD  Referred by: Austin Loffler, MD  Chief Complaint  Patient presents with  . Shoulder Pain    Right    This visit occurred during the SARS-CoV-2 public health emergency.  Safety protocols were in place, including screening questions prior to the visit, additional usage of staff PPE, and extensive cleaning of exam room while observing appropriate contact time as indicated for disinfecting solutions.   Subjective:   Austin Houston is a 59 y.o. very pleasant male patient who presents with the following:  Ongoing R shoulder pain.  For the last 3 weeks or so he has been having some worsening of his ongoing shoulder pain has been present for the last few weeks.  He has pain in the plane of abduction as well as internal range of motion.  Currently does not have any loss of motion.  Does cause him some pain at nighttime when he rotates over onto his side.  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  Has done some PT, sciatica was better.   Maybe three weeks ago.  Over the last few weeks has gotten a lot worse. Trouble with his job.  Wears lead most of the day.  No specific injury.  Pain at night.     Review of Systems is noted in the HPI, as appropriate  Objective:   BP 120/74   Pulse 73   Temp 98.1 F (36.7 C) (Temporal)   Ht 6' 0.5" (1.842 m)   Wt 206 lb 12 oz (93.8 kg)   SpO2 96%   BMI 27.65 kg/m    GEN: No acute distress; alert,appropriate. PULM: Breathing comfortably in no respiratory distress PSYCH: Normally interactive.   Shoulder:  R Inspection: No muscle wasting or winging  Ecchymosis/edema: neg  AC joint, scapula, clavicle: NT Cervical spine: NT, full ROM Spurling's: neg Abduction: full, 5/5 Flexion: full, 5/5 IR, full, lift-off: 5/5 ER at neutral: full, 5/5 AC crossover: neg Neer: pos Hawkins: pos Drop Test: neg Empty Can: pos Supraspinatus insertion: mild-mod T Bicipital groove: NT Speed's: neg Yergason's: neg Sulcus sign: neg Scapular dyskinesis: none C5-T1 intact  Neuro: Sensation intact Grip 5/5   Laboratory and Imaging Data:  Assessment and Plan:     ICD-10-CM   1. Impingement syndrome of right shoulder  M75.41   2. Acute pain of right shoulder  M25.511   3. Subacromial bursitis of right shoulder joint  M75.51    Classic impingement with subacromial bursitis.  Also been having to do some range of motion work, is aware he can develop a secondary frozen shoulder.  Shoulder anatomy was reviewed with the patient using and anatomical model.  Ideally, rehab done at least 5-6 days a week.  The patient could benefit from formal PT to assist with scapular stabilization and RTC strengthening.   Aspiration/Injection Procedure Note [name] 1960-12-04 Date of procedure: 09/13/2019  Procedure: Large Joint Aspiration / Injection of Shoulder, Subacromial, R Indications: Pain  Procedure Details Verbal consent was obtained from the patient. Risks (including rare infection), benefits, and alternatives were explained. Patient prepped with Chloraprep and Ethyl Chloride  used for anesthesia. The subacromial space was injected using the posterior approach. The patient tolerated the procedure well and had decreased pain post injection. No complications. Injection: 8 cc of Lidocaine 1% and 2 mL of Depo-Medrol 40 mg. Needle: 22 gauge Medication: 2 mL of Depo-Medrol 40 mg, equaling Depo-Medrol 80 mg total   Follow-up: No follow-ups on file.  No orders of the defined types were placed in this encounter.  There are no discontinued  medications. No orders of the defined types were placed in this encounter.   Signed,  Austin Deed. Ronita Hargreaves, MD   Outpatient Encounter Medications as of 09/13/2019  Medication Sig  . albuterol (VENTOLIN HFA) 108 (90 Base) MCG/ACT inhaler INHALE 2 PUFFS INTO THE LUNGS EVERY 6 (SIX) HOURS AS NEEDED FOR WHEEZING.  Marland Kitchen amLODipine (NORVASC) 10 MG tablet TAKE 1 TABLET (10 MG TOTAL) BY MOUTH DAILY.  . beclomethasone (QVAR) 80 MCG/ACT inhaler Inhale 2 puffs into the lungs daily.  . Carboxymethylcellul-Glycerin (LUBRICATING EYE DROPS OP) Apply 1 drop to eye daily as needed (dry eyes).  . cetirizine (ZYRTEC) 10 MG tablet Take 10 mg by mouth daily.  . cyclobenzaprine (FLEXERIL) 10 MG tablet Take 0.5-1 tablets (5-10 mg total) by mouth 3 (three) times daily as needed for muscle spasms.  . fluticasone (FLONASE) 50 MCG/ACT nasal spray Place 2 sprays into both nostrils daily.  Marland Kitchen losartan (COZAAR) 100 MG tablet TAKE 1 TABLET (100 MG TOTAL) BY MOUTH DAILY.  . Multiple Vitamin (MULTIVITAMIN WITH MINERALS) TABS tablet Take 1 tablet by mouth daily.  . naproxen (NAPROSYN) 250 MG tablet Take 500 mg by mouth 2 (two) times daily with a meal.  . omeprazole (PRILOSEC) 40 MG capsule TAKE 1 CAPSULE BY MOUTH DAILY.  Marland Kitchen ondansetron (ZOFRAN) 4 MG tablet Take 1 tablet (4 mg total) by mouth every 8 (eight) hours as needed for nausea or vomiting.  . promethazine (PHENERGAN) 25 MG suppository Place 1 suppository (25 mg total) rectally every 6 (six) hours as needed for nausea or vomiting.  . tadalafil (CIALIS) 5 MG tablet Take 5 mg by mouth daily.   Facility-Administered Encounter Medications as of 09/13/2019  Medication  . 0.9 %  sodium chloride infusion

## 2019-12-19 ENCOUNTER — Other Ambulatory Visit: Payer: Self-pay | Admitting: Family Medicine

## 2019-12-19 ENCOUNTER — Other Ambulatory Visit: Payer: Self-pay | Admitting: Pulmonary Disease

## 2020-01-05 ENCOUNTER — Ambulatory Visit: Payer: 59 | Admitting: Primary Care

## 2020-01-23 NOTE — Progress Notes (Signed)
@Patient  ID: Austin Houston, male    DOB: May 24, 1961, 59 y.o.   MRN: [id number]  Chief Complaint  Patient presents with  . Follow-up    pt had a cold but qvar helped with cold    Referring provider: Owens Loffler, MD  HPI:  59 year old male former smokeless tobacco user followed in our office for allergic rhinitis and asthma  PMH: GERD, hypertension, hyperlipidemia, PTSD, prostate cancer, follicular cancer of the thyroid  Smoker/ Smoking History: Former smokeless tobacco user  Maintenance:  Qvar Pt of: Dr. Lake Bells  01/24/2020  - Visit   59 year old male never smoker former smokeless tobacco user followed in our office for mild intermittent asthma.  He is completing follow-up with our office last seen in 2019.  He is previous patient of Dr. Lake Bells.  He reports that he is doing quite well today.  He is maintained on Qvar 1 puff daily.  He typically has more triggers with his allergies in spring and fall seasonal changes.  He takes Zyrtec every day.  He occasionally will use Flonase especially in the spring or in the fall when allergy symptoms are worsened.  He reports that he previously had to go up on the Qvar when he reports that he previously had to go up on the Qvar when he caught a cold from his grandson who is just started daycare.  He is back down to his baseline of 1 puff daily.  We will discuss and review this today.  Patient also reporting that he has received a denial from his pharmacy saying that the Qvar needs paperwork or additional review in order to be covered by his insurance.  Questionaires / Pulmonary Flowsheets:   ACT:  No flowsheet data found.  MMRC: No flowsheet data found.  Epworth:  No flowsheet data found.  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 increase since 2005 CT, two-vessel coronary arthrosclerosis, tiny hiatal hernia, aortic arthrosclerosis  PFT: September 2018 ratio 77%, FEV1 4.32 L change to 4.45 L after bronchodilator 106% predicted, total lung capacity 8.6 L 113% predicted, DLCO 42.13 115%    FENO:  Lab Results  Component Value Date   NITRICOXIDE 33 12/30/2016    PFT: PFT Results Latest Ref Rng & Units 04/06/2017  FVC-Pre L 5.60  FVC-Predicted Pre % 103  FVC-Post L 5.44  FVC-Predicted Post % 100  Pre FEV1/FVC % % 77  Post FEV1/FCV % % 82  FEV1-Pre L 4.32  FEV1-Predicted Pre % 103  FEV1-Post L 4.45  DLCO UNC% % 115  DLCO COR %Predicted % 117  TLC L 8.57  TLC % Predicted % 113  RV % Predicted % 138    WALK:  No flowsheet data found.  Imaging: No results found.  Lab Results:  CBC    Component Value Date/Time   WBC 5.4 06/21/2019 1503   RBC 4.49 06/21/2019 1503   HGB 14.8 06/21/2019 1503   HCT 43.9 06/21/2019 1503   PLT 279.0 06/21/2019 1503   MCV 97.9 06/21/2019 1503   MCH 32.8 07/16/2016 1201   MCHC 33.7 06/21/2019 1503   RDW 12.8 06/21/2019 1503   LYMPHSABS 0.9 06/21/2019 1503   MONOABS 0.5 06/21/2019 1503   EOSABS 0.2 06/21/2019 1503  BASOSABS 0.1 06/21/2019 1503    BMET    Component Value Date/Time   NA 139 06/21/2019 1503   K 4.6 06/21/2019 1503   CL 103 06/21/2019 1503   CO2 27 06/21/2019 1503   GLUCOSE 94 06/21/2019 1503   BUN 17 06/21/2019 1503   CREATININE 1.13 06/21/2019 1503   CALCIUM 9.8 06/21/2019 1503   GFRNONAA >60 07/16/2016 1201   GFRAA >60 07/16/2016 1201    BNP No results found for: BNP  ProBNP No results found for: PROBNP  Specialty Problems      Pulmonary Problems   Allergic rhinitis due to pollen   Mild persistent asthma without complication      Allergies  Allergen Reactions  . Vioxx [Rofecoxib] Anaphylaxis      Tolerates aspirin and naproxen.  . Celebrex [Celecoxib] Rash    Tolerates aspirin and naproxen.  . Codeine Rash    Patient reports this happened as a child. He has tolerated Codeine since that time  . Doxycycline Rash    Immunization History  Administered Date(s) Administered  . Influenza Split 09/10/2016  . Influenza, Quadrivalent, Recombinant, Inj, Pf 04/13/2018  . Influenza,inj,Quad PF,6+ Mos 04/13/2015  . Influenza-Unspecified 03/31/2017, 04/14/2019  . PFIZER SARS-COV-2 Vaccination 07/28/2019, 08/17/2019  . Tdap 10/21/2016    Past Medical History:  Diagnosis Date  . Allergic rhinitis due to pollen   . Allergy   . Asthma    vs COPD- taking inhalers- as related to allergies only.  . Complication of anesthesia    Hypertension only not malignant in nature in teenage yrs after surgery.  Marland Kitchen COPD (chronic obstructive pulmonary disease) (HCC)    vs asthma  . Follicular cancer of thyroid [facility])    s/p partial thyroidectomy (follicular adenoma)-surgery only  . Fracture    left arm age 82-splinted only  . GERD (gastroesophageal reflux disease)   . Hearing impaired person, bilateral    hearing aida bilateral"high frequency loss  . Hyperlipidemia   . Hypertension   . Prostate cancer ([place]) 01/08/2016  . PTSD (post-traumatic stress disorder)    s/p multiple active deployments for [employer], no medications taken currently    Tobacco History: Social History   Tobacco Use  Smoking Status Never Smoker  Smokeless Tobacco Former Systems developer  . Types: Snuff   Counseling given: Yes   Continue to not smoke  Outpatient Encounter Medications as of 01/24/2020  Medication Sig  . albuterol (VENTOLIN HFA) 108 (90 Base) MCG/ACT inhaler INHALE 2 PUFFS INTO THE LUNGS EVERY 6 (SIX) HOURS AS NEEDED FOR WHEEZING.  Marland Kitchen amLODipine (NORVASC) 10 MG tablet TAKE 1 TABLET (10 MG TOTAL) BY MOUTH DAILY.  . Carboxymethylcellul-Glycerin (LUBRICATING EYE DROPS OP) Apply 1 drop to eye daily as needed (dry eyes).   . cetirizine (ZYRTEC) 10 MG tablet Take 10 mg by mouth daily.  . cyclobenzaprine (FLEXERIL) 10 MG tablet Take 0.5-1 tablets (5-10 mg total) by mouth 3 (three) times daily as needed for muscle spasms.  . fluticasone (FLONASE) 50 MCG/ACT nasal spray Place 2 sprays into both nostrils daily.  Marland Kitchen losartan (COZAAR) 100 MG tablet TAKE 1 TABLET (100 MG TOTAL) BY MOUTH DAILY.  . Multiple Vitamin (MULTIVITAMIN WITH MINERALS) TABS tablet Take 1 tablet by mouth daily.  . naproxen (NAPROSYN) 250 MG tablet Take 500 mg by mouth 2 (two) times daily with a meal.  . omeprazole (PRILOSEC) 40 MG capsule TAKE 1 CAPSULE BY MOUTH DAILY.  Marland Kitchen ondansetron (ZOFRAN) 4 MG tablet Take 1 tablet (4 mg total)  by mouth every 8 (eight) hours as needed for nausea or vomiting.  . promethazine (PHENERGAN) 25 MG suppository Place 1 suppository (25 mg total) rectally every 6 (six) hours as needed for nausea or vomiting.  . tadalafil (CIALIS) 5 MG tablet Take 5 mg by mouth daily.  . [DISCONTINUED] QVAR REDIHALER 80 MCG/ACT inhaler INHALE 2 PUFFS INTO THE LUNGS DAILY  . budesonide-formoterol (SYMBICORT) 80-4.5 MCG/ACT inhaler Can take 2 puffs every 12 hours as needed for shortness of breath or wheezing.  Rinse mouth out after use.  Do not exceed more than 4 puffs total in a 24-hour period  . [DISCONTINUED] beclomethasone (QVAR) 80 MCG/ACT inhaler Inhale 2 puffs into the lungs daily.   Facility-Administered Encounter Medications as of 01/24/2020  Medication  . 0.9 %  sodium chloride infusion     Review of Systems  Review of Systems  Constitutional: Negative for activity change, chills, fatigue, fever and unexpected weight change.  HENT: Negative for postnasal drip, rhinorrhea, sinus pressure, sinus pain and sore throat.   Eyes: Negative.   Respiratory: Negative for cough, shortness of breath and wheezing.   Cardiovascular: Negative for chest pain and palpitations.  Gastrointestinal: Negative for constipation, diarrhea, nausea and  vomiting.  Endocrine: Negative.   Genitourinary: Negative.   Musculoskeletal: Negative.   Skin: Negative.   Neurological: Negative for dizziness and headaches.  Psychiatric/Behavioral: Negative.  Negative for dysphoric mood. The patient is not nervous/anxious.   All other systems reviewed and are negative.    Physical Exam  BP 120/76 (BP Location: Left Arm, Cuff Size: Normal)   Pulse 76   Temp 97.7 F (36.5 C) (Oral)   Ht 6\' 1"  (1.854 m)   Wt 202 lb 6.4 oz (91.8 kg)   SpO2 99%   BMI 26.70 kg/m   Wt Readings from Last 5 Encounters:  01/24/20 202 lb 6.4 oz (91.8 kg)  09/13/19 206 lb 12 oz (93.8 kg)  06/21/19 203 lb 8 oz (92.3 kg)  01/23/19 205 lb (93 kg)  01/12/19 205 lb (93 kg)    BMI Readings from Last 5 Encounters:  01/24/20 26.70 kg/m  09/13/19 27.65 kg/m  06/21/19 27.22 kg/m  01/23/19 27.05 kg/m  01/12/19 27.05 kg/m     Physical Exam Vitals and nursing note reviewed.  Constitutional:      General: He is not in acute distress.    Appearance: Normal appearance. He is normal weight.  HENT:     Head: Normocephalic and atraumatic.     Right Ear: Hearing, tympanic membrane, ear canal and external ear normal. There is no impacted cerumen.     Left Ear: Hearing, tympanic membrane, ear canal and external ear normal. There is no impacted cerumen.     Nose: No mucosal edema.     Right Turbinates: Not enlarged.     Left Turbinates: Not enlarged.     Mouth/Throat:     Mouth: Mucous membranes are dry.     Pharynx: Oropharynx is clear. No oropharyngeal exudate.     Comments: pnd Eyes:     Pupils: Pupils are equal, round, and reactive to light.  Cardiovascular:     Rate and Rhythm: Normal rate and regular rhythm.     Pulses: Normal pulses.     Heart sounds: Normal heart sounds. No murmur heard.   Pulmonary:     Effort: Pulmonary effort is normal.     Breath sounds: Normal breath sounds. No decreased breath sounds, wheezing or rales.  Musculoskeletal:  Cervical back: Normal range of motion.     Right lower leg: No edema.     Left lower leg: No edema.  Lymphadenopathy:     Cervical: No cervical adenopathy.  Skin:    General: Skin is warm and dry.     Capillary Refill: Capillary refill takes less than 2 seconds.     Findings: No erythema or rash.  Neurological:     General: No focal deficit present.     Mental Status: He is alert and oriented to person, place, and time.     Motor: No weakness.     Coordination: Coordination normal.     Gait: Gait is intact. Gait normal.  Psychiatric:        Mood and Affect: Mood normal.        Behavior: Behavior normal. Behavior is cooperative.        Thought Content: Thought content normal.        Judgment: Judgment normal.       Assessment & Plan:   Allergic rhinitis due to pollen Plan: Continue daily Zyrtec Can start nasal saline rinses if allergy symptoms begin to worsen Okay to continue to use Flonase as needed for worsened allergy symptoms or nasal congestion  Mild persistent asthma without complication Plan: Stop Qvar Start Symbicort 80 as needed Contact our office with any concerns or questions regarding how to use your Symbicort 80 Follow-up with our office in 1 year to establish care with Dr. Vaughan Browner in a 30-minute time slot    Return in about 1 year (around 01/23/2021), or if symptoms worsen or fail to improve, for Follow up with Dr. Vaughan Browner, [place].   Lauraine Rinne, NP 01/24/2020   This appointment required 32 minutes of patient care (this includes precharting, chart review, review of results, face-to-face care, etc.).

## 2020-01-24 ENCOUNTER — Other Ambulatory Visit: Payer: Self-pay

## 2020-01-24 ENCOUNTER — Other Ambulatory Visit: Payer: Self-pay | Admitting: Pulmonary Disease

## 2020-01-24 ENCOUNTER — Encounter: Payer: Self-pay | Admitting: Pulmonary Disease

## 2020-01-24 ENCOUNTER — Ambulatory Visit (INDEPENDENT_AMBULATORY_CARE_PROVIDER_SITE_OTHER): Payer: 59 | Admitting: Pulmonary Disease

## 2020-01-24 VITALS — BP 120/76 | HR 76 | Temp 97.7°F | Ht 73.0 in | Wt 202.4 lb

## 2020-01-24 DIAGNOSIS — J301 Allergic rhinitis due to pollen: Secondary | ICD-10-CM | POA: Diagnosis not present

## 2020-01-24 DIAGNOSIS — J4530 Mild persistent asthma, uncomplicated: Secondary | ICD-10-CM | POA: Insufficient documentation

## 2020-01-24 MED ORDER — BUDESONIDE-FORMOTEROL FUMARATE 80-4.5 MCG/ACT IN AERO: INHALATION_SPRAY | RESPIRATORY_TRACT | 12 refills | Status: DC

## 2020-01-24 NOTE — Assessment & Plan Note (Signed)
Plan: Continue daily Zyrtec Can start nasal saline rinses if allergy symptoms begin to worsen Okay to continue to use Flonase as needed for worsened allergy symptoms or nasal congestion

## 2020-01-24 NOTE — Patient Instructions (Addendum)
You were seen today by Lauraine Rinne, NP  for:   1. Mild persistent asthma without complication  Stop Qvar  Start as needed Symbicort 80 see instructions below  Start Symbicort 80, as needed use >>> 2 puffs in the morning right when you wake up IF symptomatic (short of breath, wheezing, cough, etc), rinse out your mouth after use, if still symptomatic 12 hours later can repeat 2 puffs, rinse after use  Ideally: You will not need to use your Symbicort 80.  Likely in the spring or in the fall you may need to use 2 puffs every morning given the fact that you typically have worsened allergies and issues with your breathing at that point in time.  If you have any questions or concerns of how or when to use this inhaler please give [place] a call  Only use your albuterol as a rescue medication to be used if you can't catch your breath by resting or doing a relaxed purse lip breathing pattern.  - The less you use it, the better it will work when you need it. - Ok to use up to 2 puffs  every 4 hours if you must but call for immediate appointment if use goes up over your usual need - Don't leave home without it !!  (think of it like the spare tire for your car)   You can still use your rescue inhaler after using your Symbicort 80 if you are short of breath or wheezing  2. Allergic rhinitis due to pollen, unspecified seasonality  Continue daily Zyrtec  In spring and fall when you have worsened allergies could consider starting nasal saline rinses see below:   nasal saline rinses 1-2x daily Use distilled water Shake well Get bottle lukewarm like a baby bottle   Follow Up:    Return in about 1 year (around 01/23/2021), or if symptoms worsen or fail to improve, for Follow up with Dr. Vaughan Browner, [place].   Please do your part to reduce the spread of COVID-19:      Reduce your risk of any infection  and COVID19 by using the similar precautions used for avoiding the common cold or flu:   Marland Kitchen Wash your hands often with soap and warm water for at least 20 seconds.  If soap and water are not readily available, use an alcohol-based hand sanitizer with at least 60% alcohol.  . If coughing or sneezing, cover your mouth and nose by coughing or sneezing into the elbow areas of your shirt or coat, into a tissue or into your sleeve (not your hands). Langley Gauss A MASK when in public  . Avoid shaking hands with others and consider head nods or verbal greetings only. . Avoid touching your eyes, nose, or mouth with unwashed hands.  . Avoid close contact with people who are sick. . Avoid places or events with large numbers of people in one location, like concerts or sporting events. . If you have some symptoms but not all symptoms, continue to monitor at home and seek medical attention if your symptoms worsen. . If you are having a medical emergency, call 911.   [place] / e-Visit: [email or web address]         [facility]: [facility]: [phone]                   [facility]: [phone]  It is flu season:   >>> Best ways to protect herself from the flu: Receive the yearly flu vaccine, practice good hand hygiene washing with soap and also using hand sanitizer when available, eat a nutritious meals, get adequate rest, hydrate appropriately   Please contact the office if your symptoms worsen or you have concerns that you are not improving.   Thank you for choosing [facility] for your healthcare, and for allowing [place] to partner with you on your healthcare journey. I am thankful to be able to provide care to you today.   Wyn Quaker FNP-C

## 2020-01-24 NOTE — Assessment & Plan Note (Signed)
Plan: Stop Qvar Start Symbicort 80 as needed Contact our office with any concerns or questions regarding how to use your Symbicort 80 Follow-up with our office in 1 year to establish care with Dr. Vaughan Browner in a 30-minute time slot

## 2020-03-07 ENCOUNTER — Ambulatory Visit (INDEPENDENT_AMBULATORY_CARE_PROVIDER_SITE_OTHER): Payer: 59 | Admitting: Dermatology

## 2020-03-07 ENCOUNTER — Other Ambulatory Visit: Payer: Self-pay

## 2020-03-07 DIAGNOSIS — D492 Neoplasm of unspecified behavior of bone, soft tissue, and skin: Secondary | ICD-10-CM

## 2020-03-07 DIAGNOSIS — D229 Melanocytic nevi, unspecified: Secondary | ICD-10-CM

## 2020-03-07 DIAGNOSIS — D485 Neoplasm of uncertain behavior of skin: Secondary | ICD-10-CM | POA: Diagnosis not present

## 2020-03-07 DIAGNOSIS — L821 Other seborrheic keratosis: Secondary | ICD-10-CM | POA: Diagnosis not present

## 2020-03-07 DIAGNOSIS — D1800 Hemangioma unspecified site: Secondary | ICD-10-CM

## 2020-03-07 DIAGNOSIS — L814 Other melanin hyperpigmentation: Secondary | ICD-10-CM | POA: Diagnosis not present

## 2020-03-07 DIAGNOSIS — D225 Melanocytic nevi of trunk: Secondary | ICD-10-CM | POA: Diagnosis not present

## 2020-03-07 DIAGNOSIS — L578 Other skin changes due to chronic exposure to nonionizing radiation: Secondary | ICD-10-CM | POA: Diagnosis not present

## 2020-03-07 DIAGNOSIS — C61 Malignant neoplasm of prostate: Secondary | ICD-10-CM | POA: Diagnosis not present

## 2020-03-07 DIAGNOSIS — Z1283 Encounter for screening for malignant neoplasm of skin: Secondary | ICD-10-CM

## 2020-03-07 DIAGNOSIS — D239 Other benign neoplasm of skin, unspecified: Secondary | ICD-10-CM

## 2020-03-07 DIAGNOSIS — L570 Actinic keratosis: Secondary | ICD-10-CM

## 2020-03-07 DIAGNOSIS — C44612 Basal cell carcinoma of skin of right upper limb, including shoulder: Secondary | ICD-10-CM | POA: Diagnosis not present

## 2020-03-07 HISTORY — DX: Other benign neoplasm of skin, unspecified: D23.9

## 2020-03-07 NOTE — Progress Notes (Addendum)
Follow-Up Visit   Subjective  [place] is a 59 y.o. male who presents for the following: Annual Exam (TBSE ). Pt concerned about a few scaly places on his face. The patient presents for Total-Body Skin Exam (TBSE) for skin cancer screening and mole check.  The following portions of the chart were reviewed this encounter and updated as appropriate:  Tobacco  Allergies  Meds  Problems  Med Hx  Surg Hx  Fam Hx     Review of Systems:  No other skin or systemic complaints except as noted in HPI or Assessment and Plan.  Objective  Well appearing patient in no apparent distress; mood and affect are within normal limits.  A full examination was performed including scalp, head, eyes, ears, nose, lips, neck, chest, axillae, abdomen, back, buttocks, bilateral upper extremities, bilateral lower extremities, hands, feet, fingers, toes, fingernails, and toenails. All findings within normal limits unless otherwise noted below.  Objective  Right lateral deltoid: 0.5 cm irregular brown macule   Objective  Left coastal infrapectoral: 0.5 cm irregular brown macule   Objective  Right post flank above waistline: 0.5 x 0.3 cm irregular brown macule   Objective  face,ears, scalp (18): Erythematous thin papules/macules with gritty scale.    Assessment & Plan    Lentigines - Scattered tan macules - Discussed due to sun exposure - Benign, observe - Call for any changes  Seborrheic Keratoses - Stuck-on, waxy, tan-brown papules and plaques  - Discussed benign etiology and prognosis. - Observe - Call for any changes  Melanocytic Nevi - Tan-brown and/or pink-flesh-colored symmetric macules and papules - Benign appearing on exam today - Observation - Call clinic for new or changing moles - Recommend daily use of broad spectrum spf 30+ sunscreen to sun-exposed areas.   Hemangiomas - Red papules - Discussed benign nature - Observe - Call for any changes  Actinic Damage -  diffuse scaly erythematous macules with underlying dyspigmentation - Recommend daily broad spectrum sunscreen SPF 30+ to sun-exposed areas, reapply every 2 hours as needed.  - Call for new or changing lesions.  Skin cancer screening performed today.  Neoplasm of skin (3) Right lateral deltoid  Skin / nail biopsy Type of biopsy: tangential   Informed consent: discussed and consent obtained   Patient was prepped and draped in usual sterile fashion: area prepped with alochol. Anesthesia: the lesion was anesthetized in a standard fashion   Anesthetic:  1% lidocaine w/ epinephrine 1-100,000 buffered w/ 8.4% NaHCO3 Instrument used: flexible razor blade   Hemostasis achieved with: pressure, aluminum chloride and electrodesiccation   Outcome: patient tolerated procedure well   Post-procedure details: wound care instructions given   Post-procedure details comment:  Ointment and small bandage  Specimen 1 - Surgical pathology Differential Diagnosis: R/O BCC Check Margins: No 0.5 cm irregular brown macule  Left coastal infrapectoral  Skin / nail biopsy Type of biopsy: tangential   Informed consent: discussed and consent obtained   Patient was prepped and draped in usual sterile fashion: area prepped with alochol. Anesthesia: the lesion was anesthetized in a standard fashion   Anesthetic:  1% lidocaine w/ epinephrine 1-100,000 buffered w/ 8.4% NaHCO3 Instrument used: flexible razor blade   Hemostasis achieved with: pressure, aluminum chloride and electrodesiccation   Outcome: patient tolerated procedure well   Post-procedure details: wound care instructions given   Post-procedure details comment:  Ointment and small bandage  Specimen 2 - Surgical pathology Differential Diagnosis: nevus R/O Dysplastic nevus  Check Margins: No  0.5 cm irregular brown macule  Right post flank above waistline  Skin / nail biopsy Type of biopsy: tangential   Informed consent: discussed and consent  obtained   Patient was prepped and draped in usual sterile fashion: area prepped with alochol. Anesthesia: the lesion was anesthetized in a standard fashion   Anesthetic:  1% lidocaine w/ epinephrine 1-100,000 buffered w/ 8.4% NaHCO3 Instrument used: flexible razor blade   Hemostasis achieved with: pressure, aluminum chloride and electrodesiccation   Outcome: patient tolerated procedure well   Post-procedure details: wound care instructions given   Post-procedure details comment:  Ointment and small bandage  Specimen 3 - Surgical pathology Differential Diagnosis: Nevus R/O Dysplastic nevus  Check Margins: No 0.5 x 0.3 cm irregular brown macule  Actinic keratosis (18) face,ears, scalp  Destruction of lesion - face,ears, scalp Complexity: simple   Destruction method: cryotherapy   Informed consent: discussed and consent obtained   Timeout:  patient name, date of birth, surgical site, and procedure verified Lesion destroyed using liquid nitrogen: Yes   Region frozen until ice ball extended beyond lesion: Yes   Outcome: patient tolerated procedure well with no complications   Post-procedure details: wound care instructions given    Skin cancer screening  Return in about 6 months (around 09/07/2020).   IMarye Round, CMA, am acting as scribe for Sarina Ser, MD .  Documentation: I have reviewed the above documentation for accuracy and completeness, and I agree with the above.  Sarina Ser, MD

## 2020-03-07 NOTE — Patient Instructions (Signed)

## 2020-03-13 ENCOUNTER — Telehealth: Payer: Self-pay

## 2020-03-13 NOTE — Telephone Encounter (Signed)
Patient advised of BX results and scheduled for EDC.  1. Skin , right lateral deltoid BASAL CELL CARCINOMA, SUPERFICIAL AND NODULAR PATTERNS 2. Skin , left coastal infrapectoral DYSPLASTIC COMPOUND NEVUS WITH MODERATE ATYPIA, CLOSE TO MARGIN 3. Skin , right post flank above waistline DYSPLASTIC JUNCTIONAL NEVUS WITH MODERATE ATYPIA, INFLAMED, CLOSE TO MARGIN  1- Cancer - BCC Schedule for treatment (EDC) 2&3- dysplastic Moderate Recheck next visit

## 2020-03-15 ENCOUNTER — Encounter: Payer: Self-pay | Admitting: Dermatology

## 2020-03-26 ENCOUNTER — Other Ambulatory Visit: Payer: Self-pay

## 2020-03-26 ENCOUNTER — Ambulatory Visit (INDEPENDENT_AMBULATORY_CARE_PROVIDER_SITE_OTHER): Payer: 59 | Admitting: Dermatology

## 2020-03-26 ENCOUNTER — Encounter: Payer: Self-pay | Admitting: Dermatology

## 2020-03-26 DIAGNOSIS — C44612 Basal cell carcinoma of skin of right upper limb, including shoulder: Secondary | ICD-10-CM

## 2020-03-26 DIAGNOSIS — C4491 Basal cell carcinoma of skin, unspecified: Secondary | ICD-10-CM

## 2020-03-26 DIAGNOSIS — Z86018 Personal history of other benign neoplasm: Secondary | ICD-10-CM

## 2020-03-26 HISTORY — DX: Basal cell carcinoma of skin, unspecified: C44.91

## 2020-03-26 NOTE — Patient Instructions (Signed)

## 2020-03-26 NOTE — Progress Notes (Signed)
   Follow-Up Visit   Subjective  Austin Houston is a 59 y.o. male who presents for the following: Skin Cancer (biopsy proven Superficial BCC R lateral deltoid ).  The following portions of the chart were reviewed this encounter and updated as appropriate:  Tobacco  Allergies  Meds  Problems  Med Hx  Surg Hx  Fam Hx     Review of Systems:  No other skin or systemic complaints except as noted in HPI or Assessment and Plan.  Objective  Well appearing patient in no apparent distress; mood and affect are within normal limits.  A focused examination was performed including Right arm . Relevant physical exam findings are noted in the Assessment and Plan.  Objective  Right lateral deltoid: Pink pearly papule or plaque with arborizing vessels.    Assessment & Plan  Basal cell carcinoma (BCC) of skin of right upper extremity including shoulder Right lateral deltoid  Destruction of lesion Complexity: extensive   Destruction method: electrodesiccation and curettage   Informed consent: discussed and consent obtained   Timeout:  patient name, date of birth, surgical site, and procedure verified Procedure prep:  Patient was prepped and draped in usual sterile fashion Prep type:  Isopropyl alcohol Anesthesia: the lesion was anesthetized in a standard fashion   Anesthetic:  1% lidocaine w/ epinephrine 1-100,000 buffered w/ 8.4% NaHCO3 Curettage performed in three different directions: Yes   Electrodesiccation performed over the curetted area: Yes   Lesion length (cm):  1.1 Lesion width (cm):  1.1 Margin per side (cm):  0.2 Final wound size (cm):  1.5 Hemostasis achieved with:  pressure, aluminum chloride and electrodesiccation Outcome: patient tolerated procedure well with no complications   Post-procedure details: sterile dressing applied and wound care instructions given   Dressing type: bandage and petrolatum    History of Dysplastic Nevi - Recommend regular full body skin  exams - Recommend daily broad spectrum sunscreen SPF 30+ to sun-exposed areas, reapply every 2 hours as needed.  - Call if any new or changing lesions are noted between office visits  Return in about 6 months (around 09/23/2020) for TBSE.  IMarye Round, CMA, am acting as scribe for Sarina Ser, MD .  Documentation: I have reviewed the above documentation for accuracy and completeness, and I agree with the above.  Sarina Ser, MD

## 2020-04-03 ENCOUNTER — Ambulatory Visit: Payer: 59 | Admitting: Dermatology

## 2020-05-22 ENCOUNTER — Ambulatory Visit: Payer: 59 | Admitting: Family Medicine

## 2020-06-12 ENCOUNTER — Other Ambulatory Visit: Payer: Self-pay

## 2020-06-12 ENCOUNTER — Ambulatory Visit (INDEPENDENT_AMBULATORY_CARE_PROVIDER_SITE_OTHER)
Admission: RE | Admit: 2020-06-12 | Discharge: 2020-06-12 | Disposition: A | Discharge status: Home / Self Care | Payer: 59 | Source: Ambulatory Visit | Attending: Family Medicine | Admitting: Family Medicine

## 2020-06-12 ENCOUNTER — Ambulatory Visit: Payer: 59 | Admitting: Family Medicine

## 2020-06-12 ENCOUNTER — Encounter: Payer: Self-pay | Admitting: Family Medicine

## 2020-06-12 VITALS — BP 110/70 | HR 78 | Temp 98.3°F | Ht 72.5 in | Wt 208.0 lb

## 2020-06-12 DIAGNOSIS — M25562 Pain in left knee: Secondary | ICD-10-CM | POA: Diagnosis not present

## 2020-06-12 DIAGNOSIS — M25511 Pain in right shoulder: Secondary | ICD-10-CM

## 2020-06-12 DIAGNOSIS — G8929 Other chronic pain: Secondary | ICD-10-CM | POA: Diagnosis not present

## 2020-06-12 DIAGNOSIS — M1712 Unilateral primary osteoarthritis, left knee: Secondary | ICD-10-CM

## 2020-06-12 NOTE — Progress Notes (Signed)
Austin Nedeau [name], MD, [place] [facility] [place], [address]  Phone: [phone]  FAX: [place] Houston - 59 y.o. male  MRN [id number]  Date of Birth: 12-03-1960  Date: 06/12/2020  PCP: Owens Loffler, MD  Referral: Owens Loffler, MD  Chief Complaint  Patient presents with  . Shoulder Pain    Right  . Knee Pain    Left    This visit occurred during the SARS-CoV-2 public health emergency.  Safety protocols were in place, including screening questions prior to the visit, additional usage of staff PPE, and extensive cleaning of exam room while observing appropriate contact time as indicated for disinfecting solutions.   Subjective:   Austin Houston is a 59 y.o. very pleasant male patient with Body mass index is 27.82 kg/m. who presents with the following:  Ongoing R shoulder pain: > 9 months Off and on will do well.  Doing better, but sometimes it will flare up This will flareup off and on. He has done physical therapy, and when he does do his rehab he does think that it will calm down after a while. Right now is not having pain with abduction or internal range of motion, and it seems to be doing better.  New onset L knee pain: He does not recall any specific incident where he hurt his left knee. 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  Will feel stiff -particularly in the morning.  IF he stays off of it it will feel better.    After he rests for a day or so, it does improve.   Review of Systems is noted in the HPI, as appropriate   Objective:   BP 110/70   Pulse 78   Temp 98.3 F (36.8 C) (Temporal)   Ht 6' 0.5" (1.842 m)   Wt 208 lb (94.3 kg)   SpO2 98%   BMI 27.82 kg/m    Knee:  L Gait: Normal heel toe pattern ROM:  0-130 Effusion: neg Echymosis or edema: none Patellar tendon NT Painful PLICA: neg Patellar grind: negative Medial and lateral patellar facet loading: negative medial and lateral joint lines:NT Mcmurray's neg Flexion-pinch neg Varus and valgus stress: stable Lachman: neg Ant and Post drawer: neg Hip abduction, IR, ER: WNL Hip flexion str: 5/5 Hip abd: 5/5 Quad: 5/5 VMO atrophy:No Hamstring concentric and eccentric: 5/5  Right shoulder continues to bother him intermittently, and at times it will do quite a bit better.  He has been doing his rehab intermittently, and when he does do this his symptoms seem to resolve.  He does have some crepitus with motion.  Range of motion is restricted mildly in all directions bilaterally.  Strength testing is 5/5 throughout.  Neurovascularly intact. Jobe testing negative Michel Bickers testing negative, Neer testing is negative  Radiology: DG Knee 4 Views W/Patella Left  Result Date: 06/12/2020 CLINICAL DATA:  Pain.  Evaluate for osteoarthritis. EXAM: LEFT KNEE - COMPLETE 4+ VIEW COMPARISON:  9172 scratch 03/29/2014 FINDINGS: Mild osteoarthritis, as evidenced by irregularity of the tibial spines and mild osteophyte formation about the patella. No joint effusion. Enthesophyte at the patellar tendon insertion. IMPRESSION: Minimal osteoarthritis, without acute osseous abnormality. Electronically Signed   By: Abigail Miyamoto M.D.   On:  06/12/2020 15:29     Assessment and Plan:     ICD-10-CM   1. Acute pain of left knee  M25.562 DG Knee 4 Views W/Patella Left  2. Primary osteoarthritis of left knee  M17.12   3. Chronic right shoulder pain  M25.511    G89.29    Total encounter time: 30 minutes. This includes total time spent on the day of encounter.  Chart review, independent review of films, review of rehab, anatomical review.  Arthritis with exacerbation and worsening with activity and wearing lead in the operating room.  I think that all this is  some wear and tear of the knee, and he will likely have some intermittent symptoms off and on forever.  I did give him some knee nonoperative stabilization and rehab to work on.  He also has some significant crepitus and pain at his right shoulder intermittently.  Wear-and-tear again, and I think he is going to do better if he does some rotator cuff strengthening and scapular stabilization at least once or twice a week indefinitely.  Global fitness would also help.  No orders of the defined types were placed in this encounter.  There are no discontinued medications. Orders Placed This Encounter  Procedures  . DG Knee 4 Views W/Patella Left    Follow-up: No follow-ups on file.  Signed,  Maud Deed. Collie Wernick, MD   Outpatient Encounter Medications as of 06/12/2020  Medication Sig  . albuterol (VENTOLIN HFA) 108 (90 Base) MCG/ACT inhaler INHALE 2 PUFFS INTO THE LUNGS EVERY 6 (SIX) HOURS AS NEEDED FOR WHEEZING.  Marland Kitchen amLODipine (NORVASC) 10 MG tablet TAKE 1 TABLET (10 MG TOTAL) BY MOUTH DAILY.  . budesonide-formoterol (SYMBICORT) 80-4.5 MCG/ACT inhaler Can take 2 puffs every 12 hours as needed for shortness of breath or wheezing.  Rinse mouth out after use.  Do not exceed more than 4 puffs total in a 24-hour period  . Carboxymethylcellul-Glycerin (LUBRICATING EYE DROPS OP) Apply 1 drop to eye daily as needed (dry eyes).  . cetirizine (ZYRTEC) 10 MG tablet Take 10 mg by mouth daily.  . cyclobenzaprine (FLEXERIL) 10 MG tablet Take 0.5-1 tablets (5-10 mg total) by mouth 3 (three) times daily as needed for muscle spasms.  . fluticasone (FLONASE) 50 MCG/ACT nasal spray Place 2 sprays into both nostrils daily.  Marland Kitchen losartan (COZAAR) 100 MG tablet TAKE 1 TABLET (100 MG TOTAL) BY MOUTH DAILY.  . Multiple Vitamin (MULTIVITAMIN WITH MINERALS) TABS tablet Take 1 tablet by mouth daily.  . naproxen (NAPROSYN) 250 MG tablet Take 500 mg by mouth 2 (two) times daily with a meal.  . omeprazole (PRILOSEC) 40 MG  capsule TAKE 1 CAPSULE BY MOUTH DAILY.  Marland Kitchen ondansetron (ZOFRAN) 4 MG tablet Take 1 tablet (4 mg total) by mouth every 8 (eight) hours as needed for nausea or vomiting.  . promethazine (PHENERGAN) 25 MG suppository Place 1 suppository (25 mg total) rectally every 6 (six) hours as needed for nausea or vomiting.  . tadalafil (CIALIS) 5 MG tablet Take 5 mg by mouth daily.  . [DISCONTINUED] beclomethasone (QVAR) 80 MCG/ACT inhaler Inhale 2 puffs into the lungs daily.   Facility-Administered Encounter Medications as of 06/12/2020  Medication  . 0.9 %  sodium chloride infusion

## 2020-06-24 ENCOUNTER — Other Ambulatory Visit: Payer: Self-pay | Admitting: Family Medicine

## 2020-07-08 ENCOUNTER — Encounter: Payer: Self-pay | Admitting: Family Medicine

## 2020-07-09 ENCOUNTER — Other Ambulatory Visit: Payer: Self-pay | Admitting: Physician Assistant

## 2020-07-09 ENCOUNTER — Telehealth: Payer: 59 | Admitting: Physician Assistant

## 2020-07-09 DIAGNOSIS — R062 Wheezing: Secondary | ICD-10-CM

## 2020-07-09 MED ORDER — PREDNISONE 20 MG PO TABS: 40.0000 mg | ORAL_TABLET | Freq: Every day | ORAL | 0 refills | Status: DC

## 2020-07-09 MED ORDER — ALBUTEROL SULFATE HFA 108 (90 BASE) MCG/ACT IN AERS
INHALATION_SPRAY | RESPIRATORY_TRACT | 0 refills | Status: DC
Start: 2020-07-09 — End: 2020-07-09

## 2020-07-09 MED ORDER — AMOXICILLIN-POT CLAVULANATE 875-125 MG PO TABS: 1.0000 | ORAL_TABLET | Freq: Two times a day (BID) | ORAL | 0 refills | Status: DC

## 2020-07-09 NOTE — Progress Notes (Signed)
Visit for Asthma  Based on what you have shared with me, it looks like you may have a flare up of your asthma.  Asthma is a chronic (ongoing) lung disease which results in airway obstruction, inflammation and hyper-responsiveness.   Asthma symptoms vary from person to person, with common symptoms including nighttime awakening and decreased ability to participate in normal activities as a result of shortness of breath. It is often triggered by changes in weather, changes in the season, changes in air temperature, or inside (home, school, daycare or work) allergens such as animal dander, mold, mildew, woodstoves or cockroaches.   It can also be triggered by hormonal changes, extreme emotion, physical exertion or an upper respiratory tract illness.     It is important to identify the trigger, and then eliminate or avoid the trigger if possible.   If you have been prescribed medications to be taken on a regular basis, it is important to follow the asthma action plan and to follow guidelines to adjust medication in response to increasing symptoms of decreased peak expiratory flow rate  Treatment: I have prescribed: Albuterol (Proventil HFA; Ventolin HFA) 108 (90 Base) MCG/ACT Inhaler 2 puffs into the lungs every six hours as needed for wheezing or shortness of breath (refill) and Prednisone 40mg  by mouth per day for 5 - 7 days.  I am also going to prescribe Augmentin.  Hopefully these things will help.  I would recommend close follow-up with your pulmonologist.  HOME CARE . Only take medications as instructed by your medical team. . Consider wearing a mask or scarf to improve breathing air temperature have been shown to decrease irritation and decrease exacerbations . Get rest. . Taking a steamy shower or using a humidifier may help nasal congestion sand ease sore throat pain. You can place a  towel over your head and breathe in the steam from hot water coming from a faucet. . Using a saline nasal spray works much the same way.  . Cough drops, hare candies and sore throat lozenges may ease your cough.  . Avoid close contacts especially the very you and the elderly . Cover your mouth if you cough or sneeze . Always remember to wash your hands.    GET HELP RIGHT AWAY IF: . You develop worsening symptoms; breathlessness at rest, drowsy, confused or agitated, unable to speak in full sentences . You have coughing fits . You develop a severe headache or visual changes . You develop shortness of breath, difficulty breathing or start having chest pain . Your symptoms persist after you have completed your treatment plan . If your symptoms do not improve within 10 days  MAKE SURE YOU . Understand these instructions. . Will watch your condition. . Will get help right away if you are not doing well or get worse.   Your e-visit answers were reviewed by a board certified advanced clinical practitioner to complete your personal care plan, Depending upon the condition, your plan could have included both over the counter or prescription medications.  Please review your pharmacy choice. Your safety is important to [place]. If you have drug allergies check your prescription carefully. You can use MyChart to ask questions about today's visit, request a non-urgent call back, or ask for a work or school excuse for 24 hours related to this e-Visit. If it has been greater than 24 hours you will need to follow up with your provider, or enter a new e-Visit to address those concerns.  You will get  an e-mail in the next two days asking about your experience. I hope that your e-visit has been valuable and will speed your recovery. Thank you for using e-visits.  Greater than 5 minutes, yet less than 10 minutes of time have been spent researching, coordinating, and implementing care for this patient today

## 2020-08-27 DIAGNOSIS — C61 Malignant neoplasm of prostate: Secondary | ICD-10-CM | POA: Diagnosis not present

## 2020-09-18 DIAGNOSIS — N5201 Erectile dysfunction due to arterial insufficiency: Secondary | ICD-10-CM | POA: Diagnosis not present

## 2020-09-18 DIAGNOSIS — C61 Malignant neoplasm of prostate: Secondary | ICD-10-CM | POA: Diagnosis not present

## 2020-09-25 ENCOUNTER — Encounter: Payer: 59 | Admitting: Dermatology

## 2020-10-30 ENCOUNTER — Telehealth: Payer: 59 | Admitting: Family

## 2020-10-30 ENCOUNTER — Other Ambulatory Visit: Payer: Self-pay

## 2020-10-30 DIAGNOSIS — J4520 Mild intermittent asthma, uncomplicated: Secondary | ICD-10-CM | POA: Diagnosis not present

## 2020-10-30 DIAGNOSIS — J069 Acute upper respiratory infection, unspecified: Secondary | ICD-10-CM | POA: Diagnosis not present

## 2020-10-30 MED ORDER — FLUTICASONE PROPIONATE 50 MCG/ACT NA SUSP
2.0000 | Freq: Every day | NASAL | 6 refills | Status: AC
  Filled 2020-10-30: qty 16, 30d supply, fill #0

## 2020-10-30 MED ORDER — PREDNISONE 10 MG PO TABS
ORAL_TABLET | ORAL | 0 refills | Status: DC
  Filled 2020-10-30: qty 21, 6d supply, fill #0

## 2020-10-30 MED ORDER — BENZONATATE 100 MG PO CAPS
100.0000 mg | ORAL_CAPSULE | Freq: Three times a day (TID) | ORAL | 0 refills | Status: DC | PRN
  Filled 2020-10-30: qty 20, 7d supply, fill #0

## 2020-10-30 NOTE — Progress Notes (Signed)
We are sorry you are not feeling well.  Here is how we plan to help!  Based on what you have shared with me, it looks like you may have a viral upper respiratory infection.  Upper respiratory infections are caused by a large number of viruses; however, rhinovirus is the most common cause.   Symptoms vary from person to person, with common symptoms including sore throat, cough, fatigue or lack of energy and feeling of general discomfort.  A low-grade fever of up to 100.4 may present, but is often uncommon.  Symptoms vary however, and are closely related to a person's age or underlying illnesses.  The most common symptoms associated with an upper respiratory infection are nasal discharge or congestion, cough, sneezing, headache and pressure in the ears and face.  These symptoms usually persist for about 3 to 10 days, but can last up to 2 weeks.  It is important to know that upper respiratory infections do not cause serious illness or complications in most cases.    Upper respiratory infections can be transmitted from person to person, with the most common method of transmission being a person's hands.  The virus is able to live on the skin and can infect other persons for up to 2 hours after direct contact.  Also, these can be transmitted when someone coughs or sneezes; thus, it is important to cover the mouth to reduce this risk.  To keep the spread of the illness at bay, good hand hygiene is very important.  This is an infection that is most likely caused by a virus. There are no specific treatments other than to help you with the symptoms until the infection runs its course.  We are sorry you are not feeling well.  Here is how we plan to help!   For nasal congestion, you may use an oral decongestants such as Mucinex D or if you have glaucoma or high blood pressure use plain Mucinex.  Saline nasal spray or nasal drops can help and can safely be used as often as needed for congestion.  For your congestion,  I have prescribed Fluticasone nasal spray one spray in each nostril twice a day  If you do not have a history of heart disease, hypertension, diabetes or thyroid disease, prostate/bladder issues or glaucoma, you may also use Sudafed to treat nasal congestion.  It is highly recommended that you consult with a pharmacist or your primary care physician to ensure this medication is safe for you to take.     If you have a cough, you may use cough suppressants such as Delsym and Robitussin.  If you have glaucoma or high blood pressure, you can also use Coricidin HBP.   For cough I have prescribed for you A prescription cough medication called Tessalon Perles 100 mg. You may take 1-2 capsules every 8 hours as needed for cough and predisone dose pack.   If you have a sore or scratchy throat, use a saltwater gargle-  to  teaspoon of salt dissolved in a 4-ounce to 8-ounce glass of warm water.  Gargle the solution for approximately 15-30 seconds and then spit.  It is important not to swallow the solution.  You can also use throat lozenges/cough drops and Chloraseptic spray to help with throat pain or discomfort.  Warm or cold liquids can also be helpful in relieving throat pain.  For headache, pain or general discomfort, you can use Ibuprofen or Tylenol as directed.   Some authorities believe that zinc   sprays or the use of Echinacea may shorten the course of your symptoms.   HOME CARE . Only take medications as instructed by your medical team. . Be sure to drink plenty of fluids. Water is fine as well as fruit juices, sodas and electrolyte beverages. You may want to stay away from caffeine or alcohol. If you are nauseated, try taking small sips of liquids. How do you know if you are getting enough fluid? Your urine should be a pale yellow or almost colorless. . Get rest. . Taking a steamy shower or using a humidifier may help nasal congestion and ease sore throat pain. You can place a towel over your head  and breathe in the steam from hot water coming from a faucet. . Using a saline nasal spray works much the same way. . Cough drops, hard candies and sore throat lozenges may ease your cough. . Avoid close contacts especially the very young and the elderly . Cover your mouth if you cough or sneeze . Always remember to wash your hands.   GET HELP RIGHT AWAY IF: . You develop worsening fever. . If your symptoms do not improve within 10 days . You develop yellow or green discharge from your nose over 3 days. . You have coughing fits . You develop a severe head ache or visual changes. . You develop shortness of breath, difficulty breathing or start having chest pain . Your symptoms persist after you have completed your treatment plan  MAKE SURE YOU   Understand these instructions.  Will watch your condition.  Will get help right away if you are not doing well or get worse.  Your e-visit answers were reviewed by a board certified advanced clinical practitioner to complete your personal care plan. Depending upon the condition, your plan could have included both over the counter or prescription medications. Please review your pharmacy choice. If there is a problem, you may call our nursing hot line at and have the prescription routed to another pharmacy. Your safety is important to us. If you have drug allergies check your prescription carefully.   You can use MyChart to ask questions about today's visit, request a non-urgent call back, or ask for a work or school excuse for 24 hours related to this e-Visit. If it has been greater than 24 hours you will need to follow up with your provider, or enter a new e-Visit to address those concerns. You will get an e-mail in the next two days asking about your experience.  I hope that your e-visit has been valuable and will speed your recovery. Thank you for using e-visits.     Approximately 5 minutes was spent documenting and reviewing patient's  chart.     

## 2020-11-06 ENCOUNTER — Telehealth: Payer: 59 | Admitting: Physician Assistant

## 2020-11-06 DIAGNOSIS — B9689 Other specified bacterial agents as the cause of diseases classified elsewhere: Secondary | ICD-10-CM

## 2020-11-06 DIAGNOSIS — J019 Acute sinusitis, unspecified: Secondary | ICD-10-CM

## 2020-11-06 MED ORDER — AMOXICILLIN-POT CLAVULANATE 875-125 MG PO TABS
1.0000 | ORAL_TABLET | Freq: Two times a day (BID) | ORAL | 0 refills | Status: DC
  Filled 2020-11-06: qty 14, 7d supply, fill #0

## 2020-11-06 NOTE — Progress Notes (Signed)
We are sorry that you are not feeling well.  Here is how we plan to help!  Based on what you have shared with me it looks like you have sinusitis.  Sinusitis is inflammation and infection in the sinus cavities of the head.  Based on your presentation I believe you most likely have Acute Bacterial Sinusitis.  This is an infection caused by bacteria and is treated with antibiotics. I have prescribed Augmentin 875mg /125mg  one tablet twice daily with food, for 7 days. You may use an oral decongestant such as Mucinex D or if you have glaucoma or high blood pressure use plain Mucinex. Saline nasal spray help and can safely be used as often as needed for congestion.  If you develop worsening sinus pain, fever or notice severe headache and vision changes, or if symptoms are not better after completion of antibiotic, please schedule an appointment with a health care provider.    Sinus infections are not as easily transmitted as other respiratory infection, however we still recommend that you avoid close contact with loved ones, especially the very young and elderly.  Remember to wash your hands thoroughly throughout the day as this is the number one way to prevent the spread of infection!  Home Care:  Only take medications as instructed by your medical team.  Complete the entire course of an antibiotic.  Do not take these medications with alcohol.  A steam or ultrasonic humidifier can help congestion.  You can place a towel over your head and breathe in the steam from hot water coming from a faucet.  Avoid close contacts especially the very young and the elderly.  Cover your mouth when you cough or sneeze.  Always remember to wash your hands.  Get Help Right Away If:  You develop worsening fever or sinus pain.  You develop a severe head ache or visual changes.  Your symptoms persist after you have completed your treatment plan.  Make sure you  Understand these instructions.  Will watch your  condition.  Will get help right away if you are not doing well or get worse.  Your e-visit answers were reviewed by a board certified advanced clinical practitioner to complete your personal care plan.  Depending on the condition, your plan could have included both over the counter or prescription medications.  If there is a problem please reply  once you have received a response from your provider.  Your safety is important to [place].  If you have drug allergies check your prescription carefully.    You can use MyChart to ask questions about today's visit, request a non-urgent call back, or ask for a work or school excuse for 24 hours related to this e-Visit. If it has been greater than 24 hours you will need to follow up with your provider, or enter a new e-Visit to address those concerns.  You will get an e-mail in the next two days asking about your experience.  I hope that your e-visit has been valuable and will speed your recovery. Thank you for using e-visits.  I provided 7 minutes of non face-to-face time during this encounter for chart review and documentation.

## 2020-11-07 ENCOUNTER — Other Ambulatory Visit: Payer: Self-pay

## 2020-12-04 DIAGNOSIS — H524 Presbyopia: Secondary | ICD-10-CM | POA: Diagnosis not present

## 2020-12-12 ENCOUNTER — Other Ambulatory Visit: Payer: Self-pay

## 2020-12-12 ENCOUNTER — Encounter: Payer: Self-pay | Admitting: Family Medicine

## 2020-12-12 ENCOUNTER — Ambulatory Visit: Payer: 59 | Admitting: Family Medicine

## 2020-12-12 VITALS — BP 118/66 | HR 75 | Temp 98.1°F | Ht 72.5 in | Wt 200.0 lb

## 2020-12-12 DIAGNOSIS — Z23 Encounter for immunization: Secondary | ICD-10-CM

## 2020-12-12 DIAGNOSIS — Z131 Encounter for screening for diabetes mellitus: Secondary | ICD-10-CM | POA: Diagnosis not present

## 2020-12-12 DIAGNOSIS — Z0000 Encounter for general adult medical examination without abnormal findings: Secondary | ICD-10-CM

## 2020-12-12 DIAGNOSIS — Z79899 Other long term (current) drug therapy: Secondary | ICD-10-CM | POA: Diagnosis not present

## 2020-12-12 DIAGNOSIS — Z13220 Encounter for screening for lipoid disorders: Secondary | ICD-10-CM

## 2020-12-12 LAB — CBC WITH DIFFERENTIAL/PLATELET
Basophils Absolute: 0.1 10*3/uL (ref 0.0–0.1)
Basophils Relative: 1.7 % (ref 0.0–3.0)
Eosinophils Absolute: 0.2 10*3/uL (ref 0.0–0.7)
Eosinophils Relative: 7.3 % — ABNORMAL HIGH (ref 0.0–5.0)
HCT: 43.1 % (ref 39.0–52.0)
Hemoglobin: 14.7 g/dL (ref 13.0–17.0)
Lymphocytes Relative: 22.2 % (ref 12.0–46.0)
Lymphs Abs: 0.7 10*3/uL (ref 0.7–4.0)
MCHC: 34 g/dL (ref 30.0–36.0)
MCV: 97.6 fl (ref 78.0–100.0)
Monocytes Absolute: 0.4 10*3/uL (ref 0.1–1.0)
Monocytes Relative: 12.9 % — ABNORMAL HIGH (ref 3.0–12.0)
Neutro Abs: 1.8 10*3/uL (ref 1.4–7.7)
Neutrophils Relative %: 55.9 % (ref 43.0–77.0)
Platelets: 227 10*3/uL (ref 150.0–400.0)
RBC: 4.42 Mil/uL (ref 4.22–5.81)
RDW: 12.9 % (ref 11.5–15.5)
WBC: 3.1 10*3/uL — ABNORMAL LOW (ref 4.0–10.5)

## 2020-12-12 LAB — HEMOGLOBIN A1C: Hgb A1c MFr Bld: 5.1 % (ref 4.6–6.5)

## 2020-12-12 NOTE — Progress Notes (Signed)
Austin Herber T. [name], MD, [place] at [facility] [place], [address]  Phone: [phone]  FAX: [place] - 60 y.o. male  MRN [id number]  Date of Birth: [birth year]  Date: 12/12/2020  PCP: Owens Loffler, MD  Referral: Owens Loffler, MD  Chief Complaint  Patient presents with  . Annual Exam    This visit occurred during the SARS-CoV-2 public health emergency.  Safety protocols were in place, including screening questions prior to the visit, additional usage of staff PPE, and extensive cleaning of exam room while observing appropriate contact time as indicated for disinfecting solutions.   Patient Care Team: Owens Loffler, MD as PCP - General (Family Medicine) Subjective:   Austin Houston is a 60 y.o. pleasant patient who presents with the following:  Preventative Health Maintenance Visit:  Health Maintenance Summary Reviewed and updated, unless pt declines services.  Tobacco History Reviewed. Alcohol: No concerns, no excessive use, often three or four Exercise Habits: Some activity, rec at least 30 mins 5 times a week STD concerns: no risk or activity to increase risk Drug Use: None  Shingrix Covid #4  Doing a lot of physical work at home  Globally, he is doing well. He is working a lot, and he was on call for about 100 hours last week.  Wt Readings from Last 3 Encounters:  12/12/20 200 lb (90.7 kg)  06/12/20 208 lb (94.3 kg)  01/24/20 202 lb 6.4 oz (91.8 kg)     Health Maintenance  Topic Date Due  . COVID-19 Vaccine (4 - Booster for [place] series) 07/01/2020  . Zoster Vaccines- Shingrix (2 of 2) 02/06/2021  . INFLUENZA VACCINE  02/10/2021  . COLONOSCOPY (Pts 45-64yr Insurance coverage will need to be confirmed)  01/22/2026  . TETANUS/TDAP  10/22/2026  . Hepatitis C Screening  Completed  . HIV Screening  Completed  . HPV VACCINES   Aged Out   Immunization History  Administered Date(s) Administered  . Anthrax 09/14/2001, 09/28/2001, 06/16/2002, 03/24/2003  . Hepatitis A, Adult 02/11/1996  . Hepatitis B, adult 12/12/1982  . Influenza Nasal 03/19/2007  . Influenza Split 09/10/2016  . Influenza Whole 05/13/1996, 07/22/1997, 07/20/1999, 06/13/2000, 06/30/2001, 04/30/2002  . Influenza, Quadrivalent, Recombinant, Inj, Pf 04/13/2018  . Influenza,inj,Quad PF,6+ Mos 04/13/2015, 04/01/2020  . Influenza-Unspecified 03/31/2017, 04/14/2019  . MMR 08/19/1997  . Meningococcal Polysaccharide 06/13/2000, 06/13/2005  . OPV 09/10/1985  . PFIZER(Purple Top)SARS-COV-2 Vaccination 07/28/2019, 08/17/2019, 04/01/2020  . Td 02/11/1996, 03/13/2004  . Tdap 10/21/2016  . Typhoid Inactivated 07/18/2005  . Typhoid Parenteral 12/11/1998, 07/23/2001, 07/21/2003  . Typhoid Parenteral, [place]D ([place] 12/12/1995  . Yellow Fever 06/13/1995, 06/13/2005  . Zoster Recombinat (Shingrix) 12/12/2020   Patient Active Problem List   Diagnosis Date Noted  . Prostate cancer ([place] 01/08/2016    Priority: High  . 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 Mild persistent asthma without complication 021/30/8657   Priority: Medium  . Family history of prostate cancer in father 06/19/2014  . Varicose veins of lower extremities with other complications 084/69/6295 . Former smokeless tobacco use 11/25/2012  . GERD (gastroesophageal reflux disease)   . Allergic rhinitis due to pollen   . Hypertension   . Hyperlipidemia   . PTSD (post-traumatic stress disorder)     Past Medical History:  Diagnosis Date  . Allergic rhinitis due to pollen   . Asthma  vs COPD- taking inhalers- as related to allergies only.  . Basal cell carcinoma 03/26/2020   R lat deltoid - ED&C   . COPD (chronic obstructive pulmonary disease) (HCC)    vs asthma  . Dysplastic nevus 03/07/2020   L costal infrapectoral - moderate  . Dysplastic nevus 03/07/2020    R post flank above waistline - moderate  . 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 GERD (gastroesophageal reflux disease)   . Hearing impaired person, bilateral    hearing aida bilateral"high frequency loss  . Hyperlipidemia   . Hypertension   . Prostate cancer ([place]) 01/08/2016  . PTSD (post-traumatic stress disorder)    s/p multiple active deployments for [employer], no medications taken currently    Past Surgical History:  Procedure Laterality Date  . COLONOSCOPY W/ POLYPECTOMY     age 48 "adenoma"  . INGUINAL HERNIA REPAIR     right  . ROBOT ASSISTED LAPAROSCOPIC RADICAL PROSTATECTOMY N/A 07/23/2016   Procedure: XI ROBOTIC ASSISTED LAPAROSCOPIC RADICAL PROSTATECTOMY LEVEL 1;  Surgeon: Raynelle Bring, MD;  Location: [facility];  Service: Urology;  Laterality: N/A;  . THYROIDECTOMY, PARTIAL     [name] ([place])    Family History  Problem Relation Age of Onset  . Heart disease Paternal Grandmother   . Prostate cancer Father        Recurrent x 3  . Hyperlipidemia Father   . Other Father        varicose veins  . Hyperlipidemia Mother   . Other Mother        varicose veins  . Hypertension Maternal Grandmother   . Hypertension Maternal Grandfather   . Sudden death Brother 46  . Colon cancer Neg Hx   . Esophageal cancer Neg Hx   . Rectal cancer Neg Hx   . Stomach cancer Neg Hx     Past Medical History, Surgical History, Social History, Family History, Problem List, Medications, and Allergies have been reviewed and updated if relevant.  Review of Systems: Pertinent positives are listed above.  Otherwise, a full 14 point review of systems has been done in full and it is negative except where it is noted positive.  Objective:   BP 118/66   Pulse 75   Temp 98.1 F (36.7 C) (Temporal)   Ht 6' 0.5" (1.842 m)   Wt 200 lb (90.7 kg)   SpO2 97%   BMI 26.75 kg/m  Ideal Body Weight: Weight in (lb) to have BMI = 25:  186.5  Ideal Body Weight: Weight in (lb) to have BMI = 25: 186.5 No exam data present Depression screen [facility] 2/9 12/12/2020 06/21/2019 12/16/2017  Decreased Interest 0 0 1  Down, Depressed, Hopeless 0 0 1  PHQ - 2 Score 0 0 2  Altered sleeping 0 - 1  Tired, decreased energy 0 - 1  Change in appetite 0 - 1  Feeling bad or failure about yourself  0 - 0  Trouble concentrating 0 - 1  Moving slowly or fidgety/restless 0 - 1  Suicidal thoughts 0 - 0  PHQ-9 Score 0 - 7  Difficult doing work/chores Not difficult at all - -     GEN: well developed, well nourished, no acute distress Eyes: conjunctiva and lids normal, PERRLA, EOMI ENT: TM clear, nares clear, oral exam WNL Neck: supple, no lymphadenopathy, no thyromegaly, no JVD Pulm: clear to auscultation and percussion, respiratory effort normal CV: regular rate and rhythm, S1-S2,  no murmur, rub or gallop, no bruits, peripheral pulses normal and symmetric, no cyanosis, clubbing, edema or varicosities GI: soft, non-tender; no hepatosplenomegaly, masses; active bowel sounds all quadrants GU: deferred Lymph: no cervical, axillary or inguinal adenopathy MSK: gait normal, muscle tone and strength WNL, no joint swelling, effusions, discoloration, crepitus  SKIN: clear, good turgor, color WNL, no rashes, lesions, or ulcerations Neuro: normal mental status, normal strength, sensation, and motion Psych: alert; oriented to person, place and time, normally interactive and not anxious or depressed in appearance.  All labs reviewed with patient. Results for orders placed or performed in visit on 15/40/08  Basic metabolic panel  Result Value Ref Range   Sodium 139 135 - 145 mEq/L   Potassium 4.6 3.5 - 5.1 mEq/L   Chloride 103 96 - 112 mEq/L   CO2 27 19 - 32 mEq/L   Glucose, Bld 94 70 - 99 mg/dL   BUN 17 6 - 23 mg/dL   Creatinine, Ser 1.13 0.40 - 1.50 mg/dL   GFR 66.90 >60.00 mL/min   Calcium 9.8 8.4 - 10.5 mg/dL  CBC with Differential  Result Value  Ref Range   WBC 5.4 4.0 - 10.5 K/uL   RBC 4.49 4.22 - 5.81 Mil/uL   Hemoglobin 14.8 13.0 - 17.0 g/dL   HCT 43.9 39.0 - 52.0 %   MCV 97.9 78.0 - 100.0 fl   MCHC 33.7 30.0 - 36.0 g/dL   RDW 12.8 11.5 - 15.5 %   Platelets 279.0 150.0 - 400.0 K/uL   Neutrophils Relative % 69.9 43.0 - 77.0 %   Lymphocytes Relative 16.5 12.0 - 46.0 %   Monocytes Relative 9.6 3.0 - 12.0 %   Eosinophils Relative 2.9 0.0 - 5.0 %   Basophils Relative 1.1 0.0 - 3.0 %   Neutro Abs 3.7 1.4 - 7.7 K/uL   Lymphs Abs 0.9 0.7 - 4.0 K/uL   Monocytes Absolute 0.5 0.1 - 1.0 K/uL   Eosinophils Absolute 0.2 0.0 - 0.7 K/uL   Basophils Absolute 0.1 0.0 - 0.1 K/uL  Hepatic function panel  Result Value Ref Range   Total Bilirubin 0.8 0.2 - 1.2 mg/dL   Bilirubin, Direct 0.2 0.0 - 0.3 mg/dL   Alkaline Phosphatase 54 39 - 117 U/L   AST 57 (H) 0 - 37 U/L   ALT 50 0 - 53 U/L   Total Protein 6.9 6.0 - 8.3 g/dL   Albumin 4.4 3.5 - 5.2 g/dL  Lipid panel  Result Value Ref Range   Cholesterol 203 (H) 0 - 200 mg/dL   Triglycerides 94.0 0.0 - 149.0 mg/dL   HDL 102.90 >39.00 mg/dL   VLDL 18.8 0.0 - 40.0 mg/dL   LDL Cholesterol 81 0 - 99 mg/dL   Total CHOL/HDL Ratio 2    NonHDL 99.67   Hemoglobin A1c  Result Value Ref Range   Hgb A1c MFr Bld 4.9 4.6 - 6.5 %    Assessment and Plan:     ICD-10-CM   1. Healthcare maintenance  Z00.00   2. Screening for diabetes mellitus  Z13.1 Hemoglobin A1c  3. Screening, lipid  Z13.220 Lipid panel  4. Encounter for long-term (current) use of medications  Z79.899 CBC with Differential/Platelet    Basic metabolic panel    Hepatic function panel  5. Need for shingles vaccine  Z23 Varicella-zoster vaccine IM   Basically, he is doing well. Check basic labs today. Work on diet and exercise and physical fitness  Shingles vaccine. He will get  COVID #4.  Follow-up 1 year  Health Maintenance Exam: The patient's preventative maintenance and recommended screening tests for an annual wellness  exam were reviewed in full today. Brought up to date unless services declined.  Counselled on the importance of diet, exercise, and its role in overall health and mortality. The patient's FH and SH was reviewed, including their home life, tobacco status, and drug and alcohol status.  Follow-up in 1 year for physical exam or additional follow-up below.  Follow-up: Return in about 1 year (around 12/12/2021) for Complete physical exam. Or follow-up in 1 year if not noted.  No orders of the defined types were placed in this encounter.  Medications Discontinued During This Encounter  Medication Reason  . amoxicillin-clavulanate (AUGMENTIN) 875-125 MG tablet Error  . benzonatate (TESSALON PERLES) 100 MG capsule Error   Orders Placed This Encounter  Procedures  . Varicella-zoster vaccine IM  . CBC with Differential/Platelet  . Basic metabolic panel  . Hepatic function panel  . Lipid panel  . Hemoglobin A1c    Signed,  Ikhlas Albo T. Shirleymae Hauth, MD   Allergies as of 12/12/2020      Reactions   Vioxx [rofecoxib] Anaphylaxis   Tolerates aspirin and naproxen.   Celebrex [celecoxib] Rash   Tolerates aspirin and naproxen.   Codeine Rash   Patient reports this happened as a child. He has tolerated Codeine since that time   Doxycycline Rash      Medication List       Accurate as of December 12, 2020  2:22 PM. If you have any questions, ask your nurse or doctor.        STOP taking these medications   amoxicillin-clavulanate 875-125 MG tablet Commonly known as: AUGMENTIN Stopped by: Owens Loffler, MD   benzonatate 100 MG capsule Commonly known as: Best boy Stopped by: Owens Loffler, MD     TAKE these medications   albuterol 108 (90 Base) MCG/ACT inhaler Commonly known as: VENTOLIN HFA INHALE 2 PUFFS INTO THE LUNGS EVERY 6 (SIX) HOURS AS NEEDED FOR WHEEZING.   amLODipine 10 MG tablet Commonly known as: NORVASC TAKE 1 TABLET (10 MG TOTAL) BY MOUTH DAILY.   cetirizine 10  MG tablet Commonly known as: ZYRTEC Take 10 mg by mouth daily.   cyclobenzaprine 10 MG tablet Commonly known as: FLEXERIL Take 0.5-1 tablets (5-10 mg total) by mouth 3 (three) times daily as needed for muscle spasms.   fluticasone 50 MCG/ACT nasal spray Commonly known as: FLONASE Place 2 sprays into both nostrils daily.   losartan 100 MG tablet Commonly known as: COZAAR TAKE 1 TABLET BY MOUTH DAILY.   LUBRICATING EYE DROPS OP Apply 1 drop to eye daily as needed (dry eyes).   multivitamin with minerals Tabs tablet Take 1 tablet by mouth daily.   naproxen 250 MG tablet Commonly known as: NAPROSYN Take 500 mg by mouth 2 (two) times daily with a meal.   omeprazole 40 MG capsule Commonly known as: PRILOSEC TAKE 1 CAPSULE BY MOUTH DAILY.   ondansetron 4 MG tablet Commonly known as: ZOFRAN Take 1 tablet (4 mg total) by mouth every 8 (eight) hours as needed for nausea or vomiting.   promethazine 25 MG suppository Commonly known as: PHENERGAN Place 1 suppository (25 mg total) rectally every 6 (six) hours as needed for nausea or vomiting.   Symbicort 80-4.5 MCG/ACT inhaler Generic drug: budesonide-formoterol INHALE 2 PUFFS BY MOUTH EVERY 12 HOURS AS NEEDED FOR SHORTNESS OF BREATH OR WHEEZING. RINSE MOUTH OUT AFTER  USE. DO NOT EXCEED MORE THAN 4 PUFFS IN 24 HOURS   tadalafil 5 MG tablet Commonly known as: CIALIS Take 5 mg by mouth daily.

## 2020-12-13 LAB — BASIC METABOLIC PANEL
BUN: 13 mg/dL (ref 6–23)
CO2: 29 mEq/L (ref 19–32)
Calcium: 9.6 mg/dL (ref 8.4–10.5)
Chloride: 101 mEq/L (ref 96–112)
Creatinine, Ser: 0.98 mg/dL (ref 0.40–1.50)
GFR: 85.05 mL/min (ref >60.00)
Glucose, Bld: 78 mg/dL (ref 70–99)
Potassium: 4.6 mEq/L (ref 3.5–5.1)
Sodium: 139 mEq/L (ref 135–145)

## 2020-12-13 LAB — HEPATIC FUNCTION PANEL
ALT: 81 U/L — ABNORMAL HIGH (ref 0–53)
AST: 123 U/L — ABNORMAL HIGH (ref 0–37)
Albumin: 4.8 g/dL (ref 3.5–5.2)
Alkaline Phosphatase: 54 U/L (ref 39–117)
Bilirubin, Direct: 0.2 mg/dL (ref 0.0–0.3)
Total Bilirubin: 0.6 mg/dL (ref 0.2–1.2)
Total Protein: 7.6 g/dL (ref 6.0–8.3)

## 2020-12-13 LAB — LIPID PANEL
Cholesterol: 230 mg/dL — ABNORMAL HIGH (ref 0–200)
HDL: 105.6 mg/dL (ref >39.00)
LDL Cholesterol: 109 mg/dL — ABNORMAL HIGH (ref 0–99)
NonHDL: 124.47
Total CHOL/HDL Ratio: 2
Triglycerides: 77 mg/dL (ref 0.0–149.0)
VLDL: 15.4 mg/dL (ref 0.0–40.0)

## 2020-12-16 ENCOUNTER — Other Ambulatory Visit: Payer: Self-pay

## 2020-12-16 ENCOUNTER — Other Ambulatory Visit: Payer: Self-pay | Admitting: Family Medicine

## 2020-12-16 MED ORDER — ONDANSETRON HCL 4 MG PO TABS
4.0000 mg | ORAL_TABLET | Freq: Three times a day (TID) | ORAL | 2 refills | Status: DC | PRN
  Filled 2020-12-16: qty 20, 7d supply, fill #0
  Filled 2021-04-07: qty 20, 7d supply, fill #1

## 2020-12-16 MED FILL — Amlodipine Besylate Tab 10 MG (Base Equivalent): ORAL | 90 days supply | Qty: 90 | Fill #0 | Status: AC

## 2020-12-16 MED FILL — Budesonide-Formoterol Fumarate Dihyd Aerosol 80-4.5 MCG/ACT: RESPIRATORY_TRACT | 30 days supply | Qty: 10.2 | Fill #0 | Status: AC

## 2020-12-16 MED FILL — Losartan Potassium Tab 100 MG: ORAL | 90 days supply | Qty: 90 | Fill #0 | Status: AC

## 2020-12-16 MED FILL — Omeprazole Cap Delayed Release 40 MG: ORAL | 90 days supply | Qty: 90 | Fill #0 | Status: AC

## 2020-12-16 NOTE — Telephone Encounter (Signed)
Last office visit 12/12/20 for CPE.  Last refilled 10/21/2016 for #20 with 2 refills.  No future appointments.

## 2020-12-23 DIAGNOSIS — R7989 Other specified abnormal findings of blood chemistry: Secondary | ICD-10-CM

## 2021-02-04 ENCOUNTER — Other Ambulatory Visit: Payer: 59

## 2021-04-07 ENCOUNTER — Other Ambulatory Visit: Payer: Self-pay

## 2021-04-07 MED FILL — Omeprazole Cap Delayed Release 40 MG: ORAL | 90 days supply | Qty: 90 | Fill #1 | Status: CN

## 2021-04-07 MED FILL — Omeprazole Cap Delayed Release 40 MG: ORAL | 90 days supply | Qty: 90 | Fill #1 | Status: AC

## 2021-04-07 MED FILL — Amlodipine Besylate Tab 10 MG (Base Equivalent): ORAL | 90 days supply | Qty: 90 | Fill #1 | Status: AC

## 2021-04-07 MED FILL — Losartan Potassium Tab 100 MG: ORAL | 90 days supply | Qty: 90 | Fill #1 | Status: CN

## 2021-04-07 MED FILL — Losartan Potassium Tab 100 MG: ORAL | 90 days supply | Qty: 90 | Fill #1 | Status: AC

## 2021-04-07 MED FILL — Amlodipine Besylate Tab 10 MG (Base Equivalent): ORAL | 90 days supply | Qty: 90 | Fill #1 | Status: CN

## 2021-06-24 ENCOUNTER — Other Ambulatory Visit: Payer: Self-pay

## 2021-06-24 ENCOUNTER — Other Ambulatory Visit: Payer: Self-pay | Admitting: Pulmonary Disease

## 2021-06-24 ENCOUNTER — Other Ambulatory Visit: Payer: Self-pay | Admitting: Family Medicine

## 2021-06-24 DIAGNOSIS — J4530 Mild persistent asthma, uncomplicated: Secondary | ICD-10-CM

## 2021-06-24 MED ORDER — AMLODIPINE BESYLATE 10 MG PO TABS
ORAL_TABLET | Freq: Every day | ORAL | 1 refills | Status: DC
  Filled 2021-06-24: qty 90, 90d supply, fill #0
  Filled 2022-01-30: qty 90, 90d supply, fill #1

## 2021-06-24 MED ORDER — LOSARTAN POTASSIUM 100 MG PO TABS
ORAL_TABLET | Freq: Every day | ORAL | 1 refills | Status: DC
  Filled 2021-06-24: qty 90, 90d supply, fill #0
  Filled 2022-01-28: qty 90, 90d supply, fill #1

## 2021-06-24 MED ORDER — OMEPRAZOLE 40 MG PO CPDR
DELAYED_RELEASE_CAPSULE | Freq: Every day | ORAL | 1 refills | Status: DC
  Filled 2021-06-24: qty 90, 90d supply, fill #0
  Filled 2021-10-20: qty 90, 90d supply, fill #1

## 2021-06-25 ENCOUNTER — Other Ambulatory Visit: Payer: Self-pay

## 2021-08-10 ENCOUNTER — Telehealth: Payer: 59 | Admitting: Nurse Practitioner

## 2021-08-10 DIAGNOSIS — R6889 Other general symptoms and signs: Secondary | ICD-10-CM

## 2021-08-10 MED ORDER — OSELTAMIVIR PHOSPHATE 75 MG PO CAPS: 75.0000 mg | ORAL_CAPSULE | Freq: Two times a day (BID) | ORAL | 0 refills | Status: AC

## 2021-08-10 NOTE — Progress Notes (Signed)
I have spent 5 minutes in review of e-visit questionnaire, review and updating patient chart, medical decision making and response to patient.  ° °Jayse Hodkinson W Kurt Azimi, NP ° °  °

## 2021-08-10 NOTE — Progress Notes (Signed)

## 2021-08-25 ENCOUNTER — Ambulatory Visit (INDEPENDENT_AMBULATORY_CARE_PROVIDER_SITE_OTHER): Payer: 59

## 2021-08-25 ENCOUNTER — Ambulatory Visit (INDEPENDENT_AMBULATORY_CARE_PROVIDER_SITE_OTHER): Payer: 59 | Admitting: Pulmonary Disease

## 2021-08-25 ENCOUNTER — Encounter: Payer: Self-pay | Admitting: Pulmonary Disease

## 2021-08-25 ENCOUNTER — Other Ambulatory Visit: Payer: Self-pay

## 2021-08-25 DIAGNOSIS — J4530 Mild persistent asthma, uncomplicated: Secondary | ICD-10-CM | POA: Diagnosis not present

## 2021-08-25 DIAGNOSIS — J45909 Unspecified asthma, uncomplicated: Secondary | ICD-10-CM | POA: Diagnosis not present

## 2021-08-25 DIAGNOSIS — J4550 Severe persistent asthma, uncomplicated: Secondary | ICD-10-CM

## 2021-08-25 LAB — CBC WITH DIFFERENTIAL/PLATELET
Basophils Absolute: 0 10*3/uL (ref 0.0–0.1)
Basophils Relative: 0.9 % (ref 0.0–3.0)
Eosinophils Absolute: 0.2 10*3/uL (ref 0.0–0.7)
Eosinophils Relative: 4.5 % (ref 0.0–5.0)
HCT: 42 % (ref 39.0–52.0)
Hemoglobin: 14.4 g/dL (ref 13.0–17.0)
Lymphocytes Relative: 21.8 % (ref 12.0–46.0)
Lymphs Abs: 1 10*3/uL (ref 0.7–4.0)
MCHC: 34.2 g/dL (ref 30.0–36.0)
MCV: 96.2 fl (ref 78.0–100.0)
Monocytes Absolute: 0.4 10*3/uL (ref 0.1–1.0)
Monocytes Relative: 8.2 % (ref 3.0–12.0)
Neutro Abs: 2.8 10*3/uL (ref 1.4–7.7)
Neutrophils Relative %: 64.6 % (ref 43.0–77.0)
Platelets: 261 10*3/uL (ref 150.0–400.0)
RBC: 4.36 Mil/uL (ref 4.22–5.81)
RDW: 12.5 % (ref 11.5–15.5)
WBC: 4.4 10*3/uL (ref 4.0–10.5)

## 2021-08-25 MED ORDER — MONTELUKAST SODIUM 10 MG PO TABS
10.0000 mg | ORAL_TABLET | Freq: Every day | ORAL | 5 refills | Status: DC
  Filled 2021-08-25: qty 30, 30d supply, fill #0
  Filled 2021-10-07: qty 90, 90d supply, fill #1
  Filled 2022-01-28: qty 30, 30d supply, fill #2
  Filled 2022-04-19: qty 30, 30d supply, fill #3

## 2021-08-25 MED ORDER — BUDESONIDE-FORMOTEROL FUMARATE 160-4.5 MCG/ACT IN AERO
2.0000 | INHALATION_SPRAY | Freq: Two times a day (BID) | RESPIRATORY_TRACT | 5 refills | Status: DC
  Filled 2021-08-25: qty 10.2, 30d supply, fill #0
  Filled 2021-11-21: qty 10.2, 30d supply, fill #1
  Filled 2022-05-20: qty 10.2, 30d supply, fill #2

## 2021-08-25 NOTE — Progress Notes (Addendum)
Austin Houston    [id number]    [birth year]  Primary Care Physician:Copland, Frederico Hamman, MD  Referring Physician: Owens Loffler, MD [place],  [place] [address]  Chief complaint: Consult for asthma  HPI: 61 year old with history of asthma Previously followed by Dr. [place].  He was initially on Foradil and then Qvar.  This was changed to Symbicort 80 in 2021 He developed COVID-19 in June 2022 followed by flu infection around Thanksgiving of 2022.  Reports worsening asthma control since these infections. He was previously using Symbicort intermittently but now using 2 puffs twice daily.  He also needs to use his albuterol rescue inhaler up to 3 times daily  Has seasonal allergies for which she is taking [place] over-the-counter.  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  No ongoing exposures.  Denies any mold, hot tub, Jacuzzi.  No feather pillows or comforters Smoking history: Never smoker.  No vaping Travel history: No significant travel history Relevant family history: No family history of lung disease   Outpatient Encounter Medications as of 08/25/2021  Medication Sig   amLODipine (NORVASC) 10 MG tablet TAKE 1 TABLET (10 MG TOTAL) BY MOUTH DAILY.   Carboxymethylcellul-Glycerin (LUBRICATING EYE DROPS OP) Apply 1 drop to eye daily as needed (dry eyes).   cetirizine ([place]) 10 MG tablet Take 10 mg by mouth daily.   cyclobenzaprine (FLEXERIL) 10 MG tablet Take 0.5-1 tablets (5-10 mg total) by mouth 3 (three) times daily as needed for muscle spasms.   fluticasone (FLONASE) 50 MCG/ACT nasal spray Place 2 sprays into both nostrils daily.   losartan (COZAAR) 100 MG tablet TAKE 1 TABLET BY MOUTH DAILY.   Multiple Vitamin (MULTIVITAMIN WITH MINERALS) TABS tablet Take 1 tablet by mouth  daily.   naproxen (NAPROSYN) 250 MG tablet Take 500 mg by mouth 2 (two) times daily with a meal.   omeprazole (PRILOSEC) 40 MG capsule TAKE 1 CAPSULE BY MOUTH DAILY.   ondansetron (ZOFRAN) 4 MG tablet Take 1 tablet (4 mg total) by mouth every 8 (eight) hours as needed for nausea or vomiting.   promethazine (PHENERGAN) 25 MG suppository Place 1 suppository (25 mg total) rectally every 6 (six) hours as needed for nausea or vomiting.   tadalafil (CIALIS) 5 MG tablet Take 5 mg by mouth daily.   albuterol (VENTOLIN HFA) 108 (90 Base) MCG/ACT inhaler INHALE 2 PUFFS INTO THE LUNGS EVERY 6 (SIX) HOURS AS NEEDED FOR WHEEZING.   budesonide-formoterol (SYMBICORT) 80-4.5 MCG/ACT inhaler INHALE 2 PUFFS EVERY 12 HOURS AS NEEDED FOR SHORTNESS OF BREATH/WHEEZING. RINSE MOUTH OUT AFTER USE. NO MORE THAN 4 PUFFS IN 24 HOURS   [DISCONTINUED] beclomethasone (QVAR) 80 MCG/ACT inhaler Inhale 2 puffs into the lungs daily.   Facility-Administered Encounter Medications as of 08/25/2021  Medication   0.9 %  sodium chloride infusion    Allergies as of 08/25/2021 - Review Complete 08/25/2021  Allergen Reaction Noted   Vioxx [rofecoxib] Anaphylaxis 11/24/2012   Celebrex [celecoxib] Rash 11/24/2012   Codeine Rash 11/24/2012   Doxycycline Rash 04/17/2013    Past Medical History:  Diagnosis Date   Allergic rhinitis due to pollen    Asthma    vs COPD- taking inhalers- as related to allergies only.   Basal cell carcinoma 03/26/2020   R lat deltoid -  ED&C    Dysplastic nevus 03/07/2020   L costal infrapectoral - moderate   Dysplastic nevus 03/07/2020   R post flank above waistline - moderate   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HCC)    s/p partial thyroidectomy (follicular adenoma)-surgery only   GERD (gastroesophageal reflux disease)    Hearing impaired person, bilateral    hearing aida bilateral"high frequency loss   Hyperlipidemia    Hypertension    Prostate cancer ([place]) 01/08/2016   PTSD (post-traumatic stress  disorder)    s/p multiple active deployments for [employer], no medications taken currently    Past Surgical History:  Procedure Laterality Date   COLONOSCOPY W/ POLYPECTOMY     age 15 "adenoma"   INGUINAL HERNIA REPAIR     right   ROBOT ASSISTED LAPAROSCOPIC RADICAL PROSTATECTOMY N/A 07/23/2016   Procedure: XI ROBOTIC ASSISTED LAPAROSCOPIC RADICAL PROSTATECTOMY LEVEL 1;  Surgeon: Austin Bring, MD;  Location: [facility];  Service: Urology;  Laterality: N/A;   THYROIDECTOMY, PARTIAL     [name] ([place])    Family History  Problem Relation Age of Onset   Heart disease Paternal Grandmother    Prostate cancer Father        Recurrent x 3   Hyperlipidemia Father    Other Father        varicose veins   Hyperlipidemia Mother    Other Mother        varicose veins   Hypertension Maternal Grandmother    Hypertension Maternal Grandfather    Sudden death Brother 50   Colon cancer Neg Hx    Esophageal cancer Neg Hx    Rectal cancer Neg Hx    Stomach cancer Neg Hx     Social History   Socioeconomic History   Marital status: Married    Spouse name: Not on file   Number of children: Not on file   Years of education: Not on file   Highest education level: Not on file  Occupational History   Occupation: Cardiac Specialist    Employer: [employer]    Comment: EMTP Cath Lab and Hybrid OR at [employer] Use   Smoking status: Never   Smokeless tobacco: Former    Types: Snuff    Quit date: 12/03/2012  Vaping Use   Vaping Use: Never used  Substance and Sexual Activity   Alcohol use: Yes    Alcohol/week: 10.0 standard drinks    Types: 10 Cans of beer per week    Comment: moderate   Drug use: No   Sexual activity: Yes    Partners: Female  Other Topics Concern   Not on file  Social History Narrative   "Austin [place]" is his preferred name   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  HENT: Negative.   Eyes: Negative for blurred vision.  Respiratory: as per HPI  Cardiovascular: Negative for chest pain and palpitations.  Gastrointestinal: Negative for vomiting, diarrhea, blood per rectum. Genitourinary:  Negative for dysuria, urgency, frequency and hematuria.  Musculoskeletal: Negative for myalgias, back pain and joint pain.  Skin: Negative for itching and rash.  Neurological: Negative for dizziness, tremors, focal weakness, seizures and loss of consciousness.  Endo/Heme/Allergies: Negative for environmental allergies.  Psychiatric/Behavioral: Negative for depression, suicidal ideas and hallucinations.  All other systems reviewed and are negative.  Physical Exam: Blood pressure 126/80, pulse 91, temperature 98 F (36.7 C), temperature source Oral, height 6\' 1"  (1.854 m), weight 214 lb (97.1 kg), SpO2 100 %. Gen:      No acute distress HEENT:  EOMI, sclera anicteric Neck:     No masses; no thyromegaly Lungs:    Clear to auscultation bilaterally; normal respiratory effort CV:         Regular rate and rhythm; no murmurs Abd:      + bowel sounds; soft, non-tender; no palpable masses, no distension Ext:    No edema; adequate peripheral perfusion Skin:      Warm and dry; no rash Neuro: alert and oriented x 3 Psych: normal mood and affect  Data Reviewed: Imaging: CT high-resolution 01/07/2017-no evidence of interstitial lung disease, thoracic aortic aneurysm, two-vessel coronary atherosclerosis, hepatic steatosis.  I have reviewed the images personally.  PFTs: 04/06/2017 FVC 5.44 [100%], FEV1 4.45 [106%],  F/F 82, TLC 8.57 [131%], DLCO 42.13 [115%] Normal lung function  ACT score 08/25/2021-15   Labs: CBC 12/12/2020-WBC 3.1, eos 7.3%, absolute eosinophilic count 116  Assessment:  Moderate persistent asthma Has worsened controlled since COVID and flu infection last year We will reassess with CBC with differential, IgE and chest x-ray today Schedule PFTs Increase Symbicort to 160, start Singulair  Thoracic aortic aneurysm Noted on CT chest in 2018 when it was 4.7 cm.   We discussed need for follow-up imaging and he prefers to discuss with primary care at his upcoming visit.   Plan/Recommendations: Increase Symbicort 160, start Singulair CBC, IgE, chest x-ray Schedule PFTs  Marshell Garfinkel MD  Pulmonary and Critical Care 08/25/2021, 10:37 AM  CC: Austin Loffler, MD

## 2021-08-25 NOTE — Patient Instructions (Signed)
Will increase the Symbicort to 160.  Take 2 puffs twice daily Start Singulair Check CBC differential, IgE and chest x-ray today Schedule PFTs and follow-up in clinic in 1 to 2 months

## 2021-08-26 LAB — IGE: IgE (Immunoglobulin E), Serum: 1689 kU/L — ABNORMAL HIGH (ref <=114)

## 2021-09-10 DIAGNOSIS — C61 Malignant neoplasm of prostate: Secondary | ICD-10-CM | POA: Diagnosis not present

## 2021-09-17 DIAGNOSIS — N5201 Erectile dysfunction due to arterial insufficiency: Secondary | ICD-10-CM | POA: Diagnosis not present

## 2021-09-17 DIAGNOSIS — C61 Malignant neoplasm of prostate: Secondary | ICD-10-CM | POA: Diagnosis not present

## 2021-10-07 ENCOUNTER — Other Ambulatory Visit: Payer: Self-pay

## 2021-10-20 ENCOUNTER — Other Ambulatory Visit: Payer: Self-pay

## 2021-10-20 ENCOUNTER — Ambulatory Visit
Admission: EM | Admit: 2021-10-20 | Discharge: 2021-10-20 | Disposition: A | Discharge status: Home / Self Care | Payer: 59 | Attending: Urgent Care | Admitting: Urgent Care

## 2021-10-20 ENCOUNTER — Encounter: Payer: Self-pay | Admitting: *Deleted

## 2021-10-20 DIAGNOSIS — K5732 Diverticulitis of large intestine without perforation or abscess without bleeding: Secondary | ICD-10-CM | POA: Diagnosis not present

## 2021-10-20 MED ORDER — AMOXICILLIN-POT CLAVULANATE 875-125 MG PO TABS
1.0000 | ORAL_TABLET | Freq: Two times a day (BID) | ORAL | 0 refills | Status: AC
  Filled 2021-10-20: qty 20, 10d supply, fill #0

## 2021-10-20 MED ORDER — ONDANSETRON 4 MG PO TBDP
4.0000 mg | ORAL_TABLET | Freq: Three times a day (TID) | ORAL | 0 refills | Status: DC | PRN
  Filled 2021-10-20: qty 20, 7d supply, fill #0

## 2021-10-20 NOTE — ED Provider Notes (Signed)
?[facility] ? ? ? ?CSN: [id number] ?Arrival date & time: 10/20/21  0801 ? ? ?  ? ?History   ?Chief Complaint ?Chief Complaint  ?Patient presents with  ? Emesis  ? Abdominal Pain  ?  vertiogo  ? ? ?HPI ?Austin Houston is a 61 y.o. male.  ? ?Pleasant 61 year old male presents today with an intermittent history of nausea and vomiting since Wednesday.  He reports he initially thought it was related to his known history of vertigo as he has had n/v with this in the past, but has been taking meclizine and Zofran without relief.  He states however his nausea and vomiting persists even though his dizziness has resolved. He felt that he was getting better and was able to eat a substantial dinner both Saturday and Sunday. He ate a banana this morning for breakfast, and vomited while at work. He admits after vomiting the banana, he is feeling slightly better, but has had sx now off and on for 5 days. He took a rectal suppository phenergan last night. He does have a confirmed hx of extensive diverticula noted on colonoscopy dating back to 2020, but denies known hx of diverticulitis. Admits to having LLQ pain. Denies diarrhea or constipation, no blood in stool. He denies fever. He denies belching or bloating, does have hx of GERD which is well controlled on daily PPI. Laying on R side seems to make sx worse, laying back or on left side helps.  ? ? ?Emesis ?Associated symptoms: abdominal pain   ?Abdominal Pain ?Associated symptoms: vomiting   ? ?Past Medical History:  ?Diagnosis Date  ? Allergic rhinitis due to pollen   ? Asthma   ? vs COPD- taking inhalers- as related to allergies only.  ? Basal cell carcinoma 03/26/2020  ? R lat deltoid - ED&C   ? Dysplastic nevus 03/07/2020  ? L costal infrapectoral - moderate  ? Dysplastic nevus 03/07/2020  ? R post flank above waistline - moderate  ? Follicular cancer of thyroid [facility]   ? s/p partial thyroidectomy (follicular adenoma)-surgery only  ? GERD (gastroesophageal reflux  disease)   ? Hearing impaired person, bilateral   ? hearing aida bilateral"high frequency loss  ? Hyperlipidemia   ? Hypertension   ? Prostate cancer ([place]) 01/08/2016  ? PTSD (post-traumatic stress disorder)   ? s/p multiple active deployments for [employer], no medications taken currently  ? ? ?Patient Active Problem List  ? Diagnosis Date Noted  ? Mild persistent asthma without complication 23/30/0762  ? Prostate cancer ([place]) 01/08/2016  ? Family history of prostate cancer in father 06/19/2014  ? Varicose veins of lower extremities with other complications 26/33/3545  ? Former smokeless tobacco use 11/25/2012  ? 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[place])   ? GERD (gastroesophageal reflux disease)   ? Allergic rhinitis due to pollen   ? Hypertension   ? Hyperlipidemia   ? PTSD (post-traumatic stress disorder)   ? ? ?Past Surgical History:  ?Procedure Laterality Date  ? COLONOSCOPY W/ POLYPECTOMY    ? age 59 "adenoma"  ? INGUINAL HERNIA REPAIR    ? right  ? ROBOT ASSISTED LAPAROSCOPIC RADICAL PROSTATECTOMY N/A 07/23/2016  ? Procedure: XI ROBOTIC ASSISTED LAPAROSCOPIC RADICAL PROSTATECTOMY LEVEL 1;  Surgeon: Raynelle Bring, MD;  Location: [facility];  Service: Urology;  Laterality: N/A;  ? THYROIDECTOMY, PARTIAL    ? [facility])  ? ? ? ? ? ?Home Medications   ? ?Prior to Admission medications   ?Medication Sig  Start Date End Date Taking? Authorizing Provider  ?amoxicillin-clavulanate (AUGMENTIN) 875-125 MG tablet Take 1 tablet by mouth every 12 (twelve) hours for 10 days. 10/20/21 10/30/21 Yes Mabry Santarelli L, PA  ?ondansetron (ZOFRAN-ODT) 4 MG disintegrating tablet Take 1 tablet (4 mg total) by mouth every 8 (eight) hours as needed for nausea or vomiting. 10/20/21  Yes Raylinn Kosar L, PA  ?albuterol (VENTOLIN HFA) 108 (90 Base) MCG/ACT inhaler INHALE 2 PUFFS INTO THE LUNGS EVERY 6 (SIX) HOURS AS NEEDED FOR WHEEZING. 07/09/20 07/09/21  Muthersbaugh, Jarrett Soho, PA-C  ?amLODipine (NORVASC) 10 MG tablet TAKE 1 TABLET (10 MG TOTAL) BY  MOUTH DAILY. 06/24/21 06/24/22  Copland, Frederico Hamman, MD  ?budesonide-formoterol (SYMBICORT) 160-4.5 MCG/ACT inhaler Inhale 2 puffs into the lungs in the morning and at bedtime. 08/25/21   Mannam, Hart Robinsons, MD  ?Carboxymethylcellul-Glycerin (LUBRICATING EYE DROPS OP) Apply 1 drop to eye daily as needed (dry eyes).    [provider]  ?cetirizine (ZYRTEC) 10 MG tablet Take 10 mg by mouth daily.    [provider]  ?cyclobenzaprine (FLEXERIL) 10 MG tablet Take 0.5-1 tablets (5-10 mg total) by mouth 3 (three) times daily as needed for muscle spasms. 05/16/18   Bedsole, Amy E, MD  ?fluticasone (FLONASE) 50 MCG/ACT nasal spray Place 2 sprays into both nostrils daily. 10/30/20   Sharion Balloon, FNP  ?losartan (COZAAR) 100 MG tablet TAKE 1 TABLET BY MOUTH DAILY. 06/24/21 06/24/22  Copland, Frederico Hamman, MD  ?montelukast (SINGULAIR) 10 MG tablet Take 1 tablet (10 mg total) by mouth at bedtime. 08/25/21   Marshell Garfinkel, MD  ?Multiple Vitamin (MULTIVITAMIN WITH MINERALS) TABS tablet Take 1 tablet by mouth daily.    [provider]  ?naproxen (NAPROSYN) 250 MG tablet Take 500 mg by mouth 2 (two) times daily with a meal.    [provider]  ?omeprazole (PRILOSEC) 40 MG capsule TAKE 1 CAPSULE BY MOUTH DAILY. 06/24/21 06/24/22  Copland, Frederico Hamman, MD  ?promethazine (PHENERGAN) 25 MG suppository Place 1 suppository (25 mg total) rectally every 6 (six) hours as needed for nausea or vomiting. 10/21/16   Copland, Frederico Hamman, MD  ?tadalafil (CIALIS) 5 MG tablet Take 5 mg by mouth daily.    [provider]  ?beclomethasone (QVAR) 80 MCG/ACT inhaler Inhale 2 puffs into the lungs daily. 04/27/18 12/19/19  Juanito Doom, MD  ? ? ?Family History ?Family History  ?Problem Relation Age of Onset  ? Heart disease Paternal Grandmother   ? Prostate cancer Father   ?     Recurrent x 3  ? Hyperlipidemia Father   ? Other Father   ?     varicose veins  ? Hyperlipidemia Mother   ? Other Mother   ?     varicose veins  ?  Hypertension Maternal Grandmother   ? Hypertension Maternal Grandfather   ? Sudden death Brother 64  ? Colon cancer Neg Hx   ? Esophageal cancer Neg Hx   ? Rectal cancer Neg Hx   ? Stomach cancer Neg Hx   ? ? ?Social History ?Social History  ? ?Tobacco Use  ? Smoking status: Never  ? Smokeless tobacco: Former  ?  Types: Snuff  ?  Quit date: 12/03/2012  ?Vaping Use  ? Vaping Use: Never used  ?Substance Use Topics  ? Alcohol use: Yes  ?  Alcohol/week: 10.0 standard drinks  ?  Types: 10 Cans of beer per week  ?  Comment: moderate  ? Drug use: No  ? ? ? ?Allergies   ?  Vioxx [rofecoxib], Celebrex [celecoxib], Codeine, and Doxycycline ? ? ?Review of Systems ?Review of Systems  ?Gastrointestinal:  Positive for abdominal pain and vomiting.  ?As per HPI ? ?Physical Exam ?Triage Vital Signs ?ED Triage Vitals  ?Enc Vitals Group  ?   BP 10/20/21 0810 (!) 142/95  ?   Pulse Rate 10/20/21 0810 (!) 101  ?   Resp 10/20/21 0810 18  ?   Temp 10/20/21 0810 98 ?F (36.7 ?C)  ?   Temp src --   ?   SpO2 10/20/21 0810 96 %  ?   Weight --   ?   Height --   ?   Head Circumference --   ?   Peak Flow --   ?   Pain Score 10/20/21 0812 3  ?   Pain Loc --   ?   Pain Edu? --   ?   Excl. in [place]? --   ? ?No data found. ? ?Updated Vital Signs ?BP (!) 142/95   Pulse (!) 101   Temp 98 ?F (36.7 ?C)   Resp 18   SpO2 96%  ? ?Visual Acuity ?Right Eye Distance:   ?Left Eye Distance:   ?Bilateral Distance:   ? ?Right Eye Near:   ?Left Eye Near:    ?Bilateral Near:    ? ?Physical Exam ?Vitals and nursing note reviewed.  ?Constitutional:   ?   General: He is not in acute distress. ?   Appearance: He is well-developed and normal weight. He is not ill-appearing, toxic-appearing or diaphoretic.  ?HENT:  ?   Head: Normocephalic and atraumatic.  ?   Mouth/Throat:  ?   Mouth: Mucous membranes are moist.  ?   Pharynx: Oropharynx is clear. No pharyngeal swelling or oropharyngeal exudate.  ?Eyes:  ?   Extraocular Movements: Extraocular movements intact.  ?   Pupils:  Pupils are equal, round, and reactive to light.  ?Cardiovascular:  ?   Rate and Rhythm: Normal rate and regular rhythm.  ?Pulmonary:  ?   Effort: Pulmonary effort is normal. No respiratory distress.  ?   Br

## 2021-10-20 NOTE — Discharge Instructions (Addendum)
Your symptoms are most likely due to acute diverticulitis. ?I have called in Augmentin for you to take, twice daily with food for 10 days. ?I have also called in oral dissolvable Zofran for you, placed under your tongue as needed to help with nausea. ?Please follow-up with your primary care physician within the next 1 week. ?If any worsening abdominal pain, intractable nausea or vomiting, or development of fever, please had to the emergency room. ?

## 2021-10-20 NOTE — ED Triage Notes (Signed)
Pt reports since WED he has had N/V ,vertigo and ABD pain. Pt has zofran and Phenergan at home that has not relieved N/V. Pt has a Hx of vertigo . ?

## 2021-10-27 ENCOUNTER — Other Ambulatory Visit: Payer: Self-pay

## 2021-10-31 ENCOUNTER — Other Ambulatory Visit: Payer: Self-pay

## 2021-10-31 MED ORDER — CARESTART COVID-19 HOME TEST VI KIT
PACK | 0 refills | Status: DC
  Filled 2021-10-31: qty 2, 4d supply, fill #0

## 2021-11-11 ENCOUNTER — Encounter: Payer: Self-pay | Admitting: Pulmonary Disease

## 2021-11-11 ENCOUNTER — Ambulatory Visit (INDEPENDENT_AMBULATORY_CARE_PROVIDER_SITE_OTHER): Payer: 59 | Admitting: Pulmonary Disease

## 2021-11-11 VITALS — BP 134/80 | HR 71 | Temp 97.9°F | Ht 74.0 in | Wt 208.6 lb

## 2021-11-11 DIAGNOSIS — J4550 Severe persistent asthma, uncomplicated: Secondary | ICD-10-CM | POA: Diagnosis not present

## 2021-11-11 DIAGNOSIS — J4540 Moderate persistent asthma, uncomplicated: Secondary | ICD-10-CM | POA: Diagnosis not present

## 2021-11-11 LAB — PULMONARY FUNCTION TEST
DL/VA % pred: 151 %
DL/VA: 6.34 ml/min/mmHg/L
DLCO cor % pred: 150 %
DLCO cor: 45.88 ml/min/mmHg
DLCO unc % pred: 149 %
DLCO unc: 45.62 ml/min/mmHg
FEF 25-75 Post: 4.14 L/sec
FEF 25-75 Pre: 2.73 L/sec
FEF2575-%Change-Post: 51 %
FEF2575-%Pred-Post: 125 %
FEF2575-%Pred-Pre: 82 %
FEV1-%Change-Post: 9 %
FEV1-%Pred-Post: 103 %
FEV1-%Pred-Pre: 94 %
FEV1-Post: 4.17 L
FEV1-Pre: 3.8 L
FEV1FVC-%Change-Post: 8 %
FEV1FVC-%Pred-Pre: 96 %
FEV6-%Change-Post: 3 %
FEV6-%Pred-Post: 103 %
FEV6-%Pred-Pre: 99 %
FEV6-Post: 5.25 L
FEV6-Pre: 5.07 L
FEV6FVC-%Change-Post: 2 %
FEV6FVC-%Pred-Post: 104 %
FEV6FVC-%Pred-Pre: 102 %
FVC-%Change-Post: 1 %
FVC-%Pred-Post: 98 %
FVC-%Pred-Pre: 97 %
FVC-Post: 5.25 L
FVC-Pre: 5.18 L
Post FEV1/FVC ratio: 79 %
Post FEV6/FVC ratio: 100 %
Pre FEV1/FVC ratio: 73 %
Pre FEV6/FVC Ratio: 98 %
RV % pred: 124 %
RV: 2.98 L
TLC % pred: 104 %
TLC: 7.93 L

## 2021-11-11 NOTE — Patient Instructions (Signed)
Performed full PFT today. ?

## 2021-11-11 NOTE — Patient Instructions (Signed)
I am glad you are doing well with regard to your breathing ?Your lung function tests are normal ?He can use the Symbicort as you are doing now and it can also be used as a rescue inhaler ?Follow-up in 1 year with either in person or video visit ?

## 2021-11-11 NOTE — Progress Notes (Signed)
? ?      Austin Houston    [id number]    [birth year] ? ?Primary Care Physician:Copland, Frederico Hamman, MD ? ?Referring Physician: Owens Loffler, MD ?[address] ?[place],  [place] [address] ? ?Chief complaint: Follow-up for asthma ? ?HPI: ?61 year old with history of asthma ?Previously followed by Dr. [place] Bells.  He was initially on Foradil and then Qvar.  This was changed to Symbicort 80 in 2021 ?He developed COVID-19 in June 2022 followed by flu infection around Thanksgiving of 2022.  Reports worsening asthma control since these infections. ?He was previously using Symbicort intermittently but now using 2 puffs twice daily.  He also needs to use his albuterol rescue inhaler up to 3 times daily ? ?Has seasonal allergies for which she is taking Zyrtec over-the-counter.  Denies GERD symptoms ? 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  No ongoing exposures.  Denies any mold, hot tub, Jacuzzi.  No feather pillows or comforters ?Smoking history: Never smoker.  No vaping ?Travel history: No significant travel history ?Relevant family history: No family history of lung disease ? ?Interim history: ?At last visit his dose of Symbicort was increased and started on Singulair.  His breathing is under much better control now and he is using Symbicort 1 puff twice daily instead of 2 puffs ? ?Outpatient Encounter Medications as of 11/11/2021  ?Medication Sig  ? amLODipine (NORVASC) 10 MG tablet TAKE 1 TABLET (10 MG TOTAL) BY MOUTH DAILY.  ? budesonide-formoterol (SYMBICORT) 160-4.5 MCG/ACT inhaler Inhale 2 puffs into the lungs in the morning and at bedtime.  ? Carboxymethylcellul-Glycerin (LUBRICATING EYE DROPS OP) Apply 1 drop to eye daily as needed (dry eyes).  ? cetirizine (ZYRTEC) 10 MG tablet Take 10 mg by mouth daily.  ? COVID-19 At Home Antigen Test [facility]  COVID-19 HOME TEST) KIT Use as directed  ? cyclobenzaprine (FLEXERIL) 10 MG tablet Take 0.5-1 tablets (5-10 mg total) by mouth 3 (three) times daily as needed for muscle spasms.  ? fluticasone (FLONASE) 50 MCG/ACT nasal spray Place 2 sprays into both nostrils daily.  ? losartan (COZAAR) 100 MG tablet TAKE 1 TABLET BY MOUTH DAILY.  ? montelukast (SINGULAIR) 10 MG tablet Take 1 tablet (10 mg total) by mouth at bedtime.  ? Multiple Vitamin (MULTIVITAMIN WITH MINERALS) TABS tablet Take 1 tablet by mouth daily.  ? naproxen (NAPROSYN) 250 MG tablet Take 500 mg by mouth 2 (two) times daily with a meal.  ? omeprazole (PRILOSEC) 40 MG capsule TAKE 1 CAPSULE BY MOUTH DAILY.  ? ondansetron (ZOFRAN-ODT) 4 MG disintegrating tablet Take 1 tablet (4 mg total) by mouth every 8 (eight) hours as needed for nausea or vomiting.  ? promethazine (PHENERGAN) 25 MG suppository Place 1 suppository (25 mg total) rectally every 6 (six) hours as needed for nausea or vomiting.  ? tadalafil (CIALIS) 5 MG tablet Take 5 mg by mouth daily.  ? albuterol (VENTOLIN HFA) 108 (90 Base) MCG/ACT inhaler INHALE 2 PUFFS INTO THE LUNGS EVERY 6 (SIX) HOURS AS NEEDED FOR WHEEZING.  ? [DISCONTINUED] beclomethasone (QVAR) 80 MCG/ACT inhaler Inhale 2 puffs into the lungs daily.  ? ?Facility-Administered Encounter Medications as of 11/11/2021  ?Medication  ? 0.9 %  sodium chloride infusion  ? ? ?Physical Exam: ?Blood pressure 134/80, pulse 71, temperature 97.9 ?F (36.6 ?C), temperature source Oral, height 6' 2" (1.88 m), weight  208 lb 9.6 oz (94.6 kg), SpO2 99 %. ?Gen:      No acute distress ?HEENT:  EOMI, sclera anicteric ?Neck:     No masses; no thyromegaly ?Lungs:    Clear to auscultation bilaterally; normal respiratory effort ?CV:         Regular rate and rhythm; no murmurs ?Abd:      + bowel sounds; soft, non-tender; no palpable masses, no distension ?Ext:    No edema; adequate peripheral perfusion ?Skin:      Warm and dry; no rash ?Neuro: alert and oriented x  3 ?Psych: normal mood and affect  ? ?Data Reviewed: ?Imaging: ?CT high-resolution 01/07/2017-no evidence of interstitial lung disease, thoracic aortic aneurysm, two-vessel coronary atherosclerosis, hepatic steatosis.   ? ?Chest x-ray 08/25/2021-coarsened interstitial changes. ?I have reviewed the images personally. ? ?PFTs: ?04/06/2017 ?FVC 5.44 [100%], FEV1 4.45 [106%], F/F 82, TLC 8.57 [131%], DLCO 42.13 [115%] ? ?11/11/2021 ?FVC 5.25 [98%], FEV1 4.17 [103%], F/F 79, TLC 7.93 [104%], DLCO 45.62 [149%] ? ?ACT score  ?08/25/2021- 15 ?11/11/2021- 23 ? ?Labs: ?CBC 12/12/2020-WBC 3.1, eos 7.3%, absolute eosinophilic count 037 ?CBC 08/25/2021-WBC 4.4, eos 4.5%, absolute eosinophil count 198 ? ?IgE 08/25/2021-1689 ? ?Assessment:  ?Moderate persistent asthma ?Has worsened controlled since COVID and flu infection last year ?Chest x-ray with coarsened interstitial changes which probably represents bronchitis inflammation as the lung volumes and diffusion capacity are above normal limits ? ?Symptoms improved with Singulair ?He will continue to use Symbicort using SMART therapy ? ?Thoracic aortic aneurysm ?Noted on CT chest in 2018 when it was 4.7 cm.   ?His primary care will order follow-up CT scan ? ?Plan/Recommendations: ?Continue Symbicort, Singulair ?Follow-up in 1 year ? ?Marshell Garfinkel MD ?[facility] ?11/11/2021, 3:39 PM ? ?CC: Copland, Frederico Hamman, MD ? ?  ?

## 2021-11-11 NOTE — Progress Notes (Signed)
Performed full PFT today. ?

## 2021-11-12 ENCOUNTER — Other Ambulatory Visit: Payer: Self-pay

## 2021-11-12 MED ORDER — COVID-19 AT HOME ANTIGEN TEST VI KIT
PACK | 0 refills | Status: DC
  Filled 2021-11-12: qty 2, 4d supply, fill #0

## 2021-11-21 ENCOUNTER — Other Ambulatory Visit: Payer: Self-pay

## 2021-11-25 ENCOUNTER — Other Ambulatory Visit: Payer: Self-pay

## 2021-11-25 ENCOUNTER — Telehealth: Payer: 59 | Admitting: Physician Assistant

## 2021-11-25 DIAGNOSIS — B370 Candidal stomatitis: Secondary | ICD-10-CM

## 2021-11-25 MED ORDER — NYSTATIN 100000 UNIT/ML MT SUSP
5.0000 mL | Freq: Four times a day (QID) | OROMUCOSAL | 0 refills | Status: DC
  Filled 2021-11-25: qty 120, 6d supply, fill #0

## 2021-11-25 NOTE — Progress Notes (Signed)
I have spent 5 minutes in review of e-visit questionnaire, review and updating patient chart, medical decision making and response to patient.   Sung Parodi Cody Colene [place], PA-C    

## 2021-11-25 NOTE — Progress Notes (Signed)
E-Visit for Mouth Ulcers ? ?We are sorry that you are not feeling well.  Here is how we plan to help! ? ?Based on what you have shared with me, it appears that you do likely have thrush secondary to ICS use. Keep hydrated. As you know, try to make sure you are always rinses out your mouth well after steroid use. I even recommend OTC Peroxyl mouthrinse 1-2 x daily for this too just as an added measure. I have sent in a Nystatin solution for you to take as directed to get rid of this issue.  Feel better soon and I hope you have a safe and fun trip! ? ? ?GET HELP RIGHT AWAY IF: ?Persistent ulcers require checking IN PERSON (face to face). Any mouth lesion lasting longer than a month should be seen by your DENTIST as soon as possible for evaluation for possible oral cancer. ?If you have a non-painful ulcer in 1 or more areas of your mouth ?Ulcers that are spreading, are very large or particularly painful ?Ulcers last longer than one week without improving on treatment ?If you develop a fever, swollen glands and begin to feel unwell ?Ulcers that developed after starting a new medication ?MAKE SURE YOU: ?Understand these instructions. ?Will watch your condition. ?Will get help right away if you are not doing well or get worse. ? ?Thank you for choosing an e-visit. ? ?Your e-visit answers were reviewed by a board certified advanced clinical practitioner to complete your personal care plan. Depending upon the condition, your plan could have included both over the counter or prescription medications. ? ?Please review your pharmacy choice. Make sure the pharmacy is open so you can pick up prescription now. If there is a problem, you may contact your provider through [employer] and have the prescription routed to another pharmacy.  Your safety is important to [place]. If you have drug allergies check your prescription carefully.  ? ?For the next 24 hours you can use MyChart to ask questions about today's visit, request a  non-urgent call back, or ask for a work or school excuse. ?You will get an email in the next two days asking about your experience. I hope that your e-visit has been valuable and will speed your recovery. ? ?

## 2021-12-22 ENCOUNTER — Other Ambulatory Visit: Payer: Self-pay | Admitting: Family Medicine

## 2021-12-22 ENCOUNTER — Telehealth: Payer: 59 | Admitting: Physician Assistant

## 2021-12-22 ENCOUNTER — Other Ambulatory Visit: Payer: Self-pay

## 2021-12-22 DIAGNOSIS — K5792 Diverticulitis of intestine, part unspecified, without perforation or abscess without bleeding: Secondary | ICD-10-CM

## 2021-12-22 MED ORDER — ONDANSETRON HCL 4 MG PO TABS
4.0000 mg | ORAL_TABLET | Freq: Three times a day (TID) | ORAL | 0 refills | Status: DC | PRN
  Filled 2021-12-22: qty 20, 7d supply, fill #0

## 2021-12-22 MED ORDER — PROMETHAZINE HCL 25 MG RE SUPP
25.0000 mg | Freq: Four times a day (QID) | RECTAL | 2 refills | Status: DC | PRN
  Filled 2021-12-22: qty 12, 3d supply, fill #0

## 2021-12-22 MED ORDER — AMOXICILLIN-POT CLAVULANATE 875-125 MG PO TABS
1.0000 | ORAL_TABLET | Freq: Two times a day (BID) | ORAL | 0 refills | Status: AC
  Filled 2021-12-22: qty 14, 7d supply, fill #0

## 2021-12-22 NOTE — Progress Notes (Signed)
E-Visit for Nausea and Vomiting   We are sorry that you are not feeling well. Here is how we plan to help!  Based on what you have shared with me it looks like you have a Virus that is irritating your GI tract.  Vomiting is the forceful emptying of a portion of the stomach's content through the mouth.  Although nausea and vomiting can make you feel miserable, it's important to remember that these are not diseases, but rather symptoms of an underlying illness.  When we treat short term symptoms, we always caution that any symptoms that persist should be fully evaluated in a medical office.  I have prescribed a medication that will help alleviate your symptoms and allow you to stay hydrated:  Zofran 4 mg 1 tablet every 8 hours as needed for nausea and vomiting  I will send in Augmentin for the diverticulitis. I will also send a separate care plan for Diverticulitis following this one as we do not have a pre-set diverticulitis e-visit.   HOME CARE: Drink clear liquids.  This is very important! Dehydration (the lack of fluid) can lead to a serious complication.  Start off with 1 tablespoon every 5 minutes for 8 hours. You may begin eating bland foods after 8 hours without vomiting.  Start with saltine crackers, white bread, rice, mashed potatoes, applesauce. After 48 hours on a bland diet, you may resume a normal diet. Try to go to sleep.  Sleep often empties the stomach and relieves the need to vomit.  GET HELP RIGHT AWAY IF:  Your symptoms do not improve or worsen within 2 days after treatment. You have a fever for over 3 days. You cannot keep down fluids after trying the medication.  MAKE SURE YOU:  Understand these instructions. Will watch your condition. Will get help right away if you are not doing well or get worse.    Thank you for choosing an e-visit.  Your e-visit answers were reviewed by a board certified advanced clinical practitioner to complete your personal care plan.  Depending upon the condition, your plan could have included both over the counter or prescription medications.  Please review your pharmacy choice. Make sure the pharmacy is open so you can pick up prescription now. If there is a problem, you may contact your provider through [employer] and have the prescription routed to another pharmacy.  Your safety is important to [place]. If you have drug allergies check your prescription carefully.   For the next 24 hours you can use MyChart to ask questions about today's visit, request a non-urgent call back, or ask for a work or school excuse. You will get an email in the next two days asking about your experience. I hope that your e-visit has been valuable and will speed your recovery.  I provided 5 minutes of non face-to-face time during this encounter for chart review and documentation.

## 2021-12-22 NOTE — Telephone Encounter (Signed)
Last office visit 12/12/20 for CPE.  Last refilled 10/21/2016 for #12 with 2 refills.  CPE scheduled 01/19/22.

## 2021-12-23 ENCOUNTER — Other Ambulatory Visit: Payer: Self-pay

## 2021-12-29 ENCOUNTER — Ambulatory Visit: Payer: Self-pay

## 2021-12-29 ENCOUNTER — Other Ambulatory Visit: Payer: Self-pay | Admitting: Family Medicine

## 2021-12-29 DIAGNOSIS — M25562 Pain in left knee: Secondary | ICD-10-CM

## 2022-01-01 ENCOUNTER — Other Ambulatory Visit: Payer: Self-pay

## 2022-01-01 ENCOUNTER — Other Ambulatory Visit: Payer: Self-pay | Admitting: Orthopedic Surgery

## 2022-01-01 ENCOUNTER — Other Ambulatory Visit (HOSPITAL_COMMUNITY): Payer: Self-pay | Admitting: Orthopedic Surgery

## 2022-01-01 ENCOUNTER — Other Ambulatory Visit (HOSPITAL_COMMUNITY): Payer: Self-pay

## 2022-01-01 DIAGNOSIS — M25562 Pain in left knee: Secondary | ICD-10-CM

## 2022-01-01 MED ORDER — DICLOFENAC SODIUM 1 % EX GEL
CUTANEOUS | 0 refills | Status: DC
  Filled 2022-01-01: qty 100, 13d supply, fill #0

## 2022-01-02 ENCOUNTER — Other Ambulatory Visit: Payer: Self-pay

## 2022-01-12 ENCOUNTER — Ambulatory Visit (HOSPITAL_COMMUNITY)
Admission: RE | Admit: 2022-01-12 | Discharge: 2022-01-12 | Disposition: A | Discharge status: Home / Self Care | Payer: PRIVATE HEALTH INSURANCE | Source: Ambulatory Visit | Attending: Orthopedic Surgery | Admitting: Orthopedic Surgery

## 2022-01-12 DIAGNOSIS — S83512A Sprain of anterior cruciate ligament of left knee, initial encounter: Secondary | ICD-10-CM | POA: Diagnosis not present

## 2022-01-12 DIAGNOSIS — M25562 Pain in left knee: Secondary | ICD-10-CM | POA: Diagnosis present

## 2022-01-19 ENCOUNTER — Encounter: Payer: 59 | Admitting: Family Medicine

## 2022-01-28 ENCOUNTER — Other Ambulatory Visit: Payer: Self-pay

## 2022-01-28 ENCOUNTER — Other Ambulatory Visit: Payer: Self-pay | Admitting: Family Medicine

## 2022-01-28 MED ORDER — OMEPRAZOLE 40 MG PO CPDR
DELAYED_RELEASE_CAPSULE | Freq: Every day | ORAL | 0 refills | Status: DC
  Filled 2022-01-28: qty 90, 90d supply, fill #0

## 2022-01-30 ENCOUNTER — Other Ambulatory Visit: Payer: Self-pay

## 2022-03-18 ENCOUNTER — Ambulatory Visit (INDEPENDENT_AMBULATORY_CARE_PROVIDER_SITE_OTHER): Payer: 59 | Admitting: Dermatology

## 2022-03-18 ENCOUNTER — Encounter: Payer: Self-pay | Admitting: Dermatology

## 2022-03-18 DIAGNOSIS — L570 Actinic keratosis: Secondary | ICD-10-CM

## 2022-03-18 DIAGNOSIS — L821 Other seborrheic keratosis: Secondary | ICD-10-CM | POA: Diagnosis not present

## 2022-03-18 DIAGNOSIS — Z1283 Encounter for screening for malignant neoplasm of skin: Secondary | ICD-10-CM

## 2022-03-18 DIAGNOSIS — Z85828 Personal history of other malignant neoplasm of skin: Secondary | ICD-10-CM

## 2022-03-18 DIAGNOSIS — Z79899 Other long term (current) drug therapy: Secondary | ICD-10-CM | POA: Diagnosis not present

## 2022-03-18 DIAGNOSIS — L578 Other skin changes due to chronic exposure to nonionizing radiation: Secondary | ICD-10-CM | POA: Diagnosis not present

## 2022-03-18 DIAGNOSIS — L820 Inflamed seborrheic keratosis: Secondary | ICD-10-CM

## 2022-03-18 DIAGNOSIS — I872 Venous insufficiency (chronic) (peripheral): Secondary | ICD-10-CM | POA: Diagnosis not present

## 2022-03-18 DIAGNOSIS — D692 Other nonthrombocytopenic purpura: Secondary | ICD-10-CM

## 2022-03-18 DIAGNOSIS — Z86018 Personal history of other benign neoplasm: Secondary | ICD-10-CM

## 2022-03-18 DIAGNOSIS — L817 Pigmented purpuric dermatosis: Secondary | ICD-10-CM | POA: Diagnosis not present

## 2022-03-18 DIAGNOSIS — L814 Other melanin hyperpigmentation: Secondary | ICD-10-CM

## 2022-03-18 DIAGNOSIS — Z5111 Encounter for antineoplastic chemotherapy: Secondary | ICD-10-CM

## 2022-03-18 DIAGNOSIS — D1800 Hemangioma unspecified site: Secondary | ICD-10-CM

## 2022-03-18 DIAGNOSIS — D229 Melanocytic nevi, unspecified: Secondary | ICD-10-CM

## 2022-03-18 MED ORDER — FLUOROURACIL 5 % EX CREA: TOPICAL_CREAM | Freq: Two times a day (BID) | CUTANEOUS | 1 refills | Status: DC

## 2022-03-18 NOTE — Patient Instructions (Addendum)
- On April 12, 2022, - Start 5-fluorouracil/calcipotriene cream twice a day for 7 days to affected areas including temples, cheeks. Prescription sent to [facility]   Instructions for [facility] Medications  One or more of your medications was sent to the [facility] mail order compounding pharmacy. You will receive an email from them and can purchase the medicine through that link. It will then be mailed to your home at the address you confirmed. If for any reason you do not receive an email from them, please check your spam folder. If you still do not find the email, please let us know. [facility] phone number is [phone].    5-Fluorouracil/Calcipotriene Patient Education   Actinic keratoses are the dry, red scaly spots on the skin caused by sun damage. A portion of these spots can turn into skin cancer with time, and treating them can help prevent development of skin cancer.   Treatment of these spots requires removal of the defective skin cells. There are various ways to remove actinic keratoses, including freezing with liquid nitrogen, treatment with creams, or treatment with a blue light procedure in the office.   5-fluorouracil cream is a topical cream used to treat actinic keratoses. It works by interfering with the growth of abnormal fast-growing skin cells, such as actinic keratoses. These cells peel off and are replaced by healthy ones.   5-fluorouracil/calcipotriene is a combination of the 5-fluorouracil cream with a vitamin D analog cream called calcipotriene. The calcipotriene alone does not treat actinic keratoses. However, when it is combined with 5-fluorouracil, it helps the 5-fluorouracil treat the actinic keratoses much faster so that the same results can be achieved with a much shorter treatment time.  INSTRUCTIONS FOR 5-FLUOROURACIL/CALCIPOTRIENE CREAM:   5-fluorouracil/calcipotriene cream typically only needs to be used for 4-7  days. A thin layer should be applied twice a day to the treatment areas recommended by your physician.   If your physician prescribed you separate tubes of 5-fluourouracil and calcipotriene, apply a thin layer of 5-fluorouracil followed by a thin layer of calcipotriene.   Avoid contact with your eyes, nostrils, and mouth. Do not use 5-fluorouracil/calcipotriene cream on infected or open wounds.   You will develop redness, irritation and some crusting at areas where you have pre-cancer damage/actinic keratoses. IF YOU DEVELOP PAIN, BLEEDING, OR SIGNIFICANT CRUSTING, STOP THE TREATMENT EARLY - you have already gotten a good response and the actinic keratoses should clear up well.  Wash your hands after applying 5-fluorouracil 5% cream on your skin.   A moisturizer or sunscreen with a minimum SPF 30 should be applied each morning.   Once you have finished the treatment, you can apply a thin layer of Vaseline twice a day to irritated areas to soothe and calm the areas more quickly. If you experience significant discomfort, contact your physician.  For some patients it is necessary to repeat the treatment for best results.  SIDE EFFECTS: When using 5-fluorouracil/calcipotriene cream, you may have mild irritation, such as redness, dryness, swelling, or a mild burning sensation. This usually resolves within 2 weeks. The more actinic keratoses you have, the more redness and inflammation you can expect during treatment. Eye irritation has been reported rarely. If this occurs, please let us know.  If you have any trouble using this cream, please call the office. If you have any other questions about this information, please do not hesitate to ask me before you leave the office.  Purpura - Chronic; persistent and  recurrent.  Treatable, but not curable. - Violaceous macules and patches - Benign - Related to trauma, age, sun damage and/or use of blood thinners, chronic use of topical and/or oral steroids -  Observe - Can use OTC arnica containing moisturizer such as Dermend Bruise Formula if desired - Call for worsening or other concerns     Due to recent changes in healthcare laws, you may see results of your pathology and/or laboratory studies on MyChart before the doctors have had a chance to review them. We understand that in some cases there may be results that are confusing or concerning to you. Please understand that not all results are received at the same time and often the doctors may need to interpret multiple results in order to provide you with the best plan of care or course of treatment. Therefore, we ask that you please give [place] 2 business days to thoroughly review all your results before contacting the office for clarification. Should we see a critical lab result, you will be contacted sooner.   If You Need Anything After Your Visit  If you have any questions or concerns for your doctor, please call our main line at [phone] and press option 4 to reach your doctor's medical assistant. If no one answers, please leave a voicemail as directed and we will return your call as soon as possible. Messages left after 4 pm will be answered the following business day.   You may also send [place] a message via [place]. We typically respond to MyChart messages within 1-2 business days.  For prescription refills, please ask your pharmacy to contact our office. Our fax number is [phone].  If you have an urgent issue when the clinic is closed that cannot wait until the next business day, you can page your doctor at the number below.    Please note that while we do our best to be available for urgent issues outside of office hours, we are not available 24/7.   If you have an urgent issue and are unable to reach [place], you may choose to seek medical care at your doctor's office, retail clinic, urgent care center, or emergency room.  If you have a medical emergency, please immediately call 911 or  go to the emergency department.  Pager Numbers  - Dr. Nehemiah Massed: [phone]  - Dr. Laurence Ferrari: [phone]  - Dr. Nicole Kindred: [phone]  In the event of inclement weather, please call our main line at [phone] for an update on the status of any delays or closures.  Dermatology Medication Tips: Please keep the boxes that topical medications come in in order to help keep track of the instructions about where and how to use these. Pharmacies typically print the medication instructions only on the boxes and not directly on the medication tubes.   If your medication is too expensive, please contact our office at [phone] option 4 or send [place] a message through [place].   We are unable to tell what your co-pay for medications will be in advance as this is different depending on your insurance coverage. However, we may be able to find a substitute medication at lower cost or fill out paperwork to get insurance to cover a needed medication.   If a prior authorization is required to get your medication covered by your insurance company, please allow [place] 1-2 business days to complete this process.  Drug prices often vary depending on where the prescription is filled and some pharmacies may offer cheaper prices.  The website www.goodrx.com contains coupons for medications through different pharmacies. The prices here do not account for what the cost may be with help from insurance (it may be cheaper with your insurance), but the website can give you the price if you did not use any insurance.  - You can print the associated coupon and take it with your prescription to the pharmacy.  - You may also stop by our office during regular business hours and pick up a GoodRx coupon card.  - If you need your prescription sent electronically to a different pharmacy, notify our office through [facility] or by phone at [phone] option 4.     Si Usted Necesita Algo Despus de Su Visita  Tambin  puede enviarnos un mensaje a travs de Pharmacist, community. Por lo general respondemos a los mensajes de MyChart en el transcurso de 1 a 2 das hbiles.  Para renovar recetas, por favor pida a su farmacia que se ponga en contacto con nuestra oficina. Harland Dingwall de fax es [place] [phone].  Si tiene un asunto urgente cuando la clnica est cerrada y que no puede esperar hasta el siguiente da hbil, puede llamar/localizar a su doctor(a) al nmero que aparece a continuacin.   Por favor, tenga en cuenta que aunque hacemos todo lo posible para estar disponibles para asuntos urgentes fuera del horario de [place], no estamos disponibles las 24 horas del da, los 7 das de la [place].   Si tiene un problema urgente y no puede comunicarse con nosotros, puede optar por buscar atencin mdica  en el consultorio de su doctor(a), en una clnica privada, en un centro de atencin urgente o en una sala de emergencias.  Si tiene Engineering geologist, por favor llame inmediatamente al 911 o vaya a la sala de emergencias.  Nmeros de bper  - Dr. Nehemiah Massed: [phone]  - Dra. Moye: [phone]  - Dra. Nicole Kindred: [phone]  En caso de inclemencias del [place], por favor llame a Johnsie Kindred principal al [phone] para una actualizacin sobre el [place] de cualquier retraso o cierre.  Consejos para la medicacin en dermatologa: Por favor, guarde las cajas en las que vienen los medicamentos de uso tpico para ayudarle a seguir las instrucciones sobre dnde y cmo usarlos. Las farmacias generalmente imprimen las instrucciones del medicamento slo en las cajas y no directamente en los tubos del [place].   Si su medicamento es muy caro, por favor, pngase en contacto con Zigmund Daniel llamando al [phone] y presione la opcin 4 o envenos un mensaje a travs de Pharmacist, community.   No podemos decirle cul ser su copago por los medicamentos por adelantado ya que esto es diferente dependiendo de la cobertura de su  seguro. Sin embargo, es posible que podamos encontrar un medicamento sustituto a Electrical engineer un formulario para que el seguro cubra el medicamento que se considera necesario.   Si se requiere una autorizacin previa para que su compaa de seguros [place] su medicamento, por favor permtanos de 1 a 2 das hbiles para completar este proceso.  Los precios de los medicamentos varan con frecuencia dependiendo del Environmental consultant de dnde se surte la receta y alguna farmacias pueden ofrecer precios ms baratos.  El sitio web www.goodrx.com tiene cupones para medicamentos de Airline pilot. Los precios aqu no tienen en cuenta lo que podra costar con la ayuda del seguro (puede ser ms barato con su seguro), pero el sitio web puede darle el precio si no utiliz Research scientist (physical sciences).  -  Puede imprimir el cupn correspondiente y llevarlo con su receta a la farmacia.  - Tambin puede pasar por nuestra oficina durante el horario de atencin regular y Charity fundraiser una tarjeta de cupones de GoodRx.  - Si necesita que su receta se enve electrnicamente a una farmacia diferente, informe a nuestra oficina a travs de MyChart de [place] o por telfono llamando al [phone] y presione la opcin 4.

## 2022-03-18 NOTE — Progress Notes (Signed)
Follow-Up Visit   Subjective  Austin Houston is a 61 y.o. male who presents for the following: check spots (Face, few months) and Total body skin exam (Hx of BCC R lat deltoid, hx of Dysplastic Nevi L costal infrapectoral, R post flank above waistline, hx of AKs). The patient presents for Total-Body Skin Exam (TBSE) for skin cancer screening and mole check.  The patient has spots, moles and lesions to be evaluated, some may be new or changing and the patient has concerns that these could be cancer.   The following portions of the chart were reviewed this encounter and updated as appropriate:   Tobacco  Allergies  Meds  Problems  Med Hx  Surg Hx  Fam Hx     Review of Systems:  No other skin or systemic complaints except as noted in HPI or Assessment and Plan.  Objective  Well appearing patient in no apparent distress; mood and affect are within normal limits.  A full examination was performed including scalp, head, eyes, ears, nose, lips, neck, chest, axillae, abdomen, back, buttocks, bilateral upper extremities, bilateral lower extremities, hands, feet, fingers, toes, fingernails, and toenails. All findings within normal limits unless otherwise noted below.  Left Wrist x 1, L abdomen x 1 (2) Stuck on waxy paps with erythema  face x 15 (15) Pink scaly macules  bil lower legs Stasis changes lower legs   Assessment & Plan   Lentigines - Scattered tan macules - Due to sun exposure - Benign-appearing, observe - Recommend daily broad spectrum sunscreen SPF 30+ to sun-exposed areas, reapply every 2 hours as needed. - Call for any changes  Seborrheic Keratoses - Stuck-on, waxy, tan-brown papules and/or plaques  - Benign-appearing - Discussed benign etiology and prognosis. - Observe - Call for any changes  Melanocytic Nevi - Tan-brown and/or pink-flesh-colored symmetric macules and papules - Benign appearing on exam today - Observation - Call clinic for new or changing  moles - Recommend daily use of broad spectrum spf 30+ sunscreen to sun-exposed areas.   Hemangiomas - Red papules - Discussed benign nature - Observe - Call for any changes  Actinic Damage - Severe, confluent actinic changes with pre-cancerous actinic keratoses  - Severe, chronic, not at goal, secondary to cumulative UV radiation exposure over time - diffuse scaly erythematous macules and papules with underlying dyspigmentation - Discussed Prescription "Field Treatment" for Severe, Chronic Confluent Actinic Changes with Pre-Cancerous Actinic Keratoses Field treatment involves treatment of an entire area of skin that has confluent Actinic Changes (Sun/ Ultraviolet light damage) and PreCancerous Actinic Keratoses by method of PhotoDynamic Therapy (PDT) and/or prescription Topical Chemotherapy agents such as 5-fluorouracil, 5-fluorouracil/calcipotriene, and/or imiquimod.  The purpose is to decrease the number of clinically evident and subclinical PreCancerous lesions to prevent progression to development of skin cancer by chemically destroying early precancer changes that may or may not be visible.  It has been shown to reduce the risk of developing skin cancer in the treated area. As a result of treatment, redness, scaling, crusting, and open sores may occur during treatment course. One or more than one of these methods may be used and may have to be used several times to control, suppress and eliminate the PreCancerous changes. Discussed treatment course, expected reaction, and possible side effects. - Recommend daily broad spectrum sunscreen SPF 30+ to sun-exposed areas, reapply every 2 hours as needed.  - Staying in the shade or wearing long sleeves, sun glasses (UVA+UVB protection) and wide brim hats (4-inch brim  around the entire circumference of the hat) are also recommended. - Call for new or changing lesions.  - On April 12, 2022, - Start 5-fluorouracil/calcipotriene cream twice a day for 7  days to affected areas including temples, cheeks. Prescription sent to [facility]. Patient advised they will receive an email to purchase the medication online and have it sent to their home. Patient provided with handout reviewing treatment course and side effects and advised to call or message [place] on MyChart with any concerns.   Skin cancer screening performed today.   History of Basal Cell Carcinoma of the Skin - No evidence of recurrence today - Recommend regular full body skin exams - Recommend daily broad spectrum sunscreen SPF 30+ to sun-exposed areas, reapply every 2 hours as needed.  - Call if any new or changing lesions are noted between office visits  - R lat deltoid  History of Dysplastic Nevi - No evidence of recurrence today - Recommend regular full body skin exams - Recommend daily broad spectrum sunscreen SPF 30+ to sun-exposed areas, reapply every 2 hours as needed.  - Call if any new or changing lesions are noted between office visits  - L costal infrapectoral, R post flank  Varicose Veins/Spider Veins - Dilated blue, purple or red veins at the lower extremities - Reassured - Smaller vessels can be treated by sclerotherapy (a procedure to inject a medicine into the veins to make them disappear) if desired, but the treatment is not covered by insurance. Larger vessels may be covered if symptomatic and we would refer to vascular surgeon if treatment desired.  - bil legs  Purpura - Chronic; persistent and recurrent.  Treatable, but not curable. - Violaceous macules and patches - Benign - Related to trauma, age, sun damage and/or use of blood thinners, chronic use of topical and/or oral steroids - Observe - Can use OTC arnica containing moisturizer such as Dermend Bruise Formula if desired - Call for worsening or other concerns   Inflamed seborrheic keratosis (2) Left Wrist x 1, L abdomen x 1 Symptomatic, irritating, patient would like  treated. Destruction of lesion - Left Wrist x 1, L abdomen x 1 Complexity: simple   Destruction method: cryotherapy   Informed consent: discussed and consent obtained   Timeout:  patient name, date of birth, surgical site, and procedure verified Lesion destroyed using liquid nitrogen: Yes   Region frozen until ice ball extended beyond lesion: Yes   Outcome: patient tolerated procedure well with no complications   Post-procedure details: wound care instructions given    AK (actinic keratosis) (15) face x 15 Destruction of lesion - face x 15 Complexity: simple   Destruction method: cryotherapy   Informed consent: discussed and consent obtained   Timeout:  patient name, date of birth, surgical site, and procedure verified Lesion destroyed using liquid nitrogen: Yes   Region frozen until ice ball extended beyond lesion: Yes   Outcome: patient tolerated procedure well with no complications   Post-procedure details: wound care instructions given    fluorouracil (EFUDEX) 5 % cream - face x 15 Apply topically 2 (two) times daily. 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  Recommend daily graduated compression hose/stockings- easiest to put on first thing in morning, remove at bedtime.  Elevate legs as much as  possible. Avoid salt/sodium rich foods.  Return in about 6 months (around 09/16/2022) for AK f/u.  I, Othelia Pulling, RMA, am acting as scribe for Sarina Ser, MD . Documentation: I have reviewed the above documentation for accuracy and completeness, and I agree with the above.  Sarina Ser, MD

## 2022-03-20 ENCOUNTER — Encounter: Payer: Self-pay | Admitting: Dermatology

## 2022-03-22 NOTE — Progress Notes (Unsigned)
T. , MD, [place] [facility] [place], [address]  Phone: [phone]  FAX: Zap - 61 y.o. male  MRN [id number]  Date of Birth: Oct 03, 1960  Date: 03/23/2022  PCP: Owens Loffler, MD  Referral: Owens Loffler, MD  No chief complaint on file.  Patient Care Team: Owens Loffler, MD as PCP - General (Family Medicine) Subjective:   Austin Houston is a 61 y.o. pleasant patient who presents with the following:  Preventative Health Maintenance Visit:  Health Maintenance Summary Reviewed and updated, unless pt declines services.  Tobacco History Reviewed. Alcohol: No concerns, no excessive use Exercise Habits: Some activity, rec at least 30 mins 5 times a week STD concerns: no risk or activity to increase risk Drug Use: None  Pleasant gentleman presents for long-term health maintenance exam.  He does have a history of prostate cancer.  He does follow-up well with Dr. Alinda Money.  Lipids: Doing well, stable. Tolerating meds fine with no SE. Panel reviewed with patient.  Lipids: Lab Results  Component Value Date   CHOL 230 (H) 12/12/2020   Lab Results  Component Value Date   HDL 105.60 12/12/2020   Lab Results  Component Value Date   LDLCALC 109 (H) 12/12/2020   Lab Results  Component Value Date   TRIG 77.0 12/12/2020   Lab Results  Component Value Date   CHOLHDL 2 12/12/2020    Lab Results  Component Value Date   ALT 81 (H) 12/12/2020   AST 123 (H) 12/12/2020   ALKPHOS 54 12/12/2020   BILITOT 0.6 12/12/2020    HTN: Tolerating all medications without side effects -losartan 100 mg. Stable and at goal No CP, no sob. No HA.  BP Readings from Last 3 Encounters:  11/11/21 134/80  10/20/21 (!) 142/95  08/25/21 126/80   He also has asthma and takes Symbicort as well as Ventolin as needed.  Singulair.  10 mg.    Health Maintenance  Topic Date  Due   COVID-19 Vaccine (4 - Pfizer risk series) 05/27/2020   Zoster Vaccines- Shingrix (2 of 2) 02/06/2021   INFLUENZA VACCINE  02/10/2022   COLONOSCOPY (Pts 45-1yr Insurance coverage will need to be confirmed)  01/22/2026   TETANUS/TDAP  10/22/2026   Hepatitis C Screening  Completed   HIV Screening  Completed   HPV VACCINES  Aged Out   Immunization History  Administered Date(s) Administered   Anthrax 09/14/2001, 09/28/2001, 06/16/2002, 03/24/2003   Hepatitis A, Adult 02/11/1996   Hepatitis B, adult 12/12/1982   Influenza Nasal 03/19/2007   Influenza Split 09/10/2016   Influenza Whole 05/13/1996, 07/22/1997, 07/20/1999, 06/13/2000, 06/30/2001, 04/30/2002   Influenza, Quadrivalent, Recombinant, Inj, Pf 04/13/2018   Influenza,inj,Quad PF,6+ Mos 04/13/2015, 04/01/2020   Influenza-Unspecified 03/31/2017, 04/14/2019   MMR 08/19/1997   Meningococcal Polysaccharide 06/13/2000, 06/13/2005   OPV 09/10/1985   PFIZER(Purple Top)SARS-COV-2 Vaccination 07/28/2019, 08/17/2019, 04/01/2020   Td 02/11/1996, 03/13/2004   Tdap 10/21/2016   Typhoid Inactivated 07/18/2005   Typhoid Parenteral 12/11/1998, 07/23/2001, 07/21/2003   Typhoid Parenteral, AKD ([place] 12/12/1995   Yellow Fever 06/13/1995, 06/13/2005   Zoster Recombinat (Shingrix) 12/12/2020   Patient Active Problem List   Diagnosis Date Noted   [place] 01/08[place]    Priority: High   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[place]     Priority: High   Mild persistent asthma without complication 068/09/2120   Priority: Medium    Hypertension  Priority: Medium    Hyperlipidemia     Priority: Medium    Former smokeless tobacco use 11/25/2012    Priority: Low   GERD (gastroesophageal reflux disease)     Priority: Low   Allergic rhinitis due to pollen     Priority: Low   Family history of prostate cancer in father 06/19/2014   Varicose veins of lower extremities with other complications 62/83/6629   PTSD (post-traumatic  stress disorder)     Past Medical History:  Diagnosis Date   Actinic keratosis    Allergic rhinitis due to pollen    Asthma    vs COPD- taking inhalers- as related to allergies only.   Basal cell carcinoma 03/26/2020   R lat deltoid - ED&C    Dysplastic nevus 03/07/2020   L costal infrapectoral - moderate   Dysplastic nevus 03/07/2020   R post flank above waistline - moderate   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HCC)    s/p partial thyroidectomy (follicular adenoma)-surgery only   GERD (gastroesophageal reflux disease)    Hearing impaired person, bilateral    hearing aida bilateral"high frequency loss   Hyperlipidemia    Hypertension    Prostate cancer ([place]) 01/08/2016   PTSD (post-traumatic stress disorder)    s/p multiple active deployments for [employer], no medications taken currently    Past Surgical History:  Procedure Laterality Date   COLONOSCOPY W/ POLYPECTOMY     age 61 "adenoma"   INGUINAL HERNIA REPAIR     right   ROBOT ASSISTED LAPAROSCOPIC RADICAL PROSTATECTOMY N/A 07/23/2016   Procedure: XI ROBOTIC ASSISTED LAPAROSCOPIC RADICAL PROSTATECTOMY LEVEL 1;  Surgeon: Raynelle Bring, MD;  Location: [facility];  Service: Urology;  Laterality: N/A;   THYROIDECTOMY, PARTIAL     [name] ([place])    Family History  Problem Relation Age of Onset   Heart disease Paternal Grandmother    Prostate cancer Father        Recurrent x 3   Hyperlipidemia Father    Other Father        varicose veins   Hyperlipidemia Mother    Other Mother        varicose veins   Hypertension Maternal Grandmother    Hypertension Maternal Grandfather    Sudden death Brother 63   Colon cancer Neg Hx    Esophageal cancer Neg Hx    Rectal cancer Neg Hx    Stomach cancer Neg Hx     Social History   Social History Narrative   "[name]" is his preferred name   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  Review of Systems: Pertinent positives are listed above.  Otherwise, a full 14 point review of systems has been done in full and it is negative except where it is noted positive.  Objective:   There were no vitals taken for this visit. Ideal Body Weight:    Ideal Body Weight:   No results found.    12/12/2020    8:45 AM 06/21/2019    2:05 PM 12/16/2017    9:18 AM  Depression screen PHQ 2/9  Decreased Interest 0 0 1  Down, Depressed, Hopeless 0 0 1  PHQ - 2 Score 0  0 2  Altered sleeping 0  1  Tired, decreased energy 0  1  Change in appetite 0  1  Feeling bad or failure about yourself  0  0  Trouble concentrating 0  1  Moving slowly or fidgety/restless 0  1  Suicidal thoughts 0  0  PHQ-9 Score 0  7  Difficult doing work/chores Not difficult at all       GEN: well developed, well nourished, no acute distress Eyes: conjunctiva and lids normal, PERRLA, EOMI ENT: TM clear, nares clear, oral exam WNL Neck: supple, no lymphadenopathy, no thyromegaly, no JVD Pulm: clear to auscultation and percussion, respiratory effort normal CV: regular rate and rhythm, S1-S2, no murmur, rub or gallop, no bruits, peripheral pulses normal and symmetric, no cyanosis, clubbing, edema or varicosities GI: soft, non-tender; no hepatosplenomegaly, masses; active bowel sounds all quadrants GU: deferred Lymph: no cervical, axillary or inguinal adenopathy MSK: gait normal, muscle tone and strength WNL, no joint swelling, effusions, discoloration, crepitus  SKIN: clear, good turgor, color WNL, no rashes, lesions, or ulcerations Neuro: normal mental status, normal strength, sensation, and motion Psych: alert; oriented to person, place and time, normally interactive and not anxious or depressed in appearance.  All labs reviewed with  patient. Results for orders placed or performed in visit on 08/25/21  CBC with Differential/Platelet  Result Value Ref Range   WBC 4.4 4.0 - 10.5 K/uL   RBC 4.36 4.22 - 5.81 Mil/uL   Hemoglobin 14.4 13.0 - 17.0 g/dL   HCT 42.0 39.0 - 52.0 %   MCV 96.2 78.0 - 100.0 fl   MCHC 34.2 30.0 - 36.0 g/dL   RDW 12.5 11.5 - 15.5 %   Platelets 261.0 150.0 - 400.0 K/uL   Neutrophils Relative % 64.6 43.0 - 77.0 %   Lymphocytes Relative 21.8 12.0 - 46.0 %   Monocytes Relative 8.2 3.0 - 12.0 %   Eosinophils Relative 4.5 0.0 - 5.0 %   Basophils Relative 0.9 0.0 - 3.0 %   Neutro Abs 2.8 1.4 - 7.7 K/uL   Lymphs Abs 1.0 0.7 - 4.0 K/uL   Monocytes Absolute 0.4 0.1 - 1.0 K/uL   Eosinophils Absolute 0.2 0.0 - 0.7 K/uL   Basophils Absolute 0.0 0.0 - 0.1 K/uL  IgE  Result Value Ref Range   IgE (Immunoglobulin E), Serum 1,689 (H) <OR=114 kU/L  Pulmonary function test  Result Value Ref Range   FVC-Pre 5.18 L   FVC-%Pred-Pre 97 %   FVC-Post 5.25 L   FVC-%Pred-Post 98 %   FVC-%Change-Post 1 %   FEV1-Pre 3.80 L   FEV1-%Pred-Pre 94 %   FEV1-Post 4.17 L   FEV1-%Pred-Post 103 %   FEV1-%Change-Post 9 %   FEV6-Pre 5.07 L   FEV6-%Pred-Pre 99 %   FEV6-Post 5.25 L   FEV6-%Pred-Post 103 %   FEV6-%Change-Post 3 %   Pre FEV1/FVC ratio 73 %   FEV1FVC-%Pred-Pre 96 %   Post FEV1/FVC ratio 79 %   FEV1FVC-%Change-Post 8 %   Pre FEV6/FVC Ratio 98 %   FEV6FVC-%Pred-Pre 102 %   Post FEV6/FVC ratio 100 %   FEV6FVC-%Pred-Post 104 %   FEV6FVC-%Change-Post 2 %   FEF 25-75 Pre 2.73 L/sec   FEF2575-%Pred-Pre 82 %   FEF 25-75 Post 4.14 L/sec   FEF2575-%Pred-Post 125 %   FEF2575-%Change-Post 51 %   RV 2.98 L   RV % pred 124 %   TLC 7.93 L   TLC % pred 104 %   DLCO  unc 45.62 ml/min/mmHg   DLCO unc % pred 149 %   DLCO cor 45.88 ml/min/mmHg   DLCO cor % pred 150 %   DL/VA 6.34 ml/min/mmHg/L   DL/VA % pred 151 %    Assessment and Plan:     ICD-10-CM   1. Healthcare maintenance  Z00.00       Health  Maintenance Exam: The patient's preventative maintenance and recommended screening tests for an annual wellness exam were reviewed in full today. Brought up to date unless services declined.  Counselled on the importance of diet, exercise, and its role in overall health and mortality. The patient's FH and SH was reviewed, including their home life, tobacco status, and drug and alcohol status.  Follow-up in 1 year for physical exam or additional follow-up below.  Disposition: No follow-ups on file.  No orders of the defined types were placed in this encounter.  There are no discontinued medications. No orders of the defined types were placed in this encounter.   Signed,  Maud Deed. , MD   Allergies as of 03/23/2022       Reactions   Vioxx [rofecoxib] Anaphylaxis   Tolerates aspirin and naproxen.   Celebrex [celecoxib] Rash   Tolerates aspirin and naproxen.   Codeine Rash   Patient reports this happened as a child. He has tolerated Codeine since that time   Doxycycline Rash        Medication List        Accurate as of March 22, 2022 10:07 AM. If you have any questions, ask your nurse or doctor.          albuterol 108 (90 Base) MCG/ACT inhaler Commonly known as: VENTOLIN HFA INHALE 2 PUFFS INTO THE LUNGS EVERY 6 (SIX) HOURS AS NEEDED FOR WHEEZING.   amLODipine 10 MG tablet Commonly known as: NORVASC TAKE 1 TABLET (10 MG TOTAL) BY MOUTH DAILY.   Carestart COVID-19 Home Test Kit Generic drug: COVID-19 At Home Antigen Test Use as directed   cetirizine 10 MG tablet Commonly known as: ZYRTEC Take 10 mg by mouth daily.   cyclobenzaprine 10 MG tablet Commonly known as: FLEXERIL Take 0.5-1 tablets (5-10 mg total) by mouth 3 (three) times daily as needed for muscle spasms.   diclofenac Sodium 1 % Gel Commonly known as: VOLTAREN Apply 1 a small amount to affected area twice a day   fluorouracil 5 % cream Commonly known as: EFUDEX Apply topically 2  (two) times daily. Bid to temples, cheeks for 7 days   fluticasone 50 MCG/ACT nasal spray Commonly known as: FLONASE Place 2 sprays into both nostrils daily.   losartan 100 MG tablet Commonly known as: COZAAR TAKE 1 TABLET BY MOUTH DAILY.   LUBRICATING EYE DROPS OP Apply 1 drop to eye daily as needed (dry eyes).   montelukast 10 MG tablet Commonly known as: SINGULAIR Take 1 tablet (10 mg total) by mouth at bedtime.   multivitamin with minerals Tabs tablet Take 1 tablet by mouth daily.   naproxen 250 MG tablet Commonly known as: NAPROSYN Take 500 mg by mouth 2 (two) times daily with a meal.   nystatin 100000 UNIT/ML suspension Commonly known as: MYCOSTATIN Take 5 mLs (500,000 Units total) by mouth 4 (four) times daily. Swish, gargle and spit.   omeprazole 40 MG capsule Commonly known as: PRILOSEC TAKE 1 CAPSULE BY MOUTH DAILY.   ondansetron 4 MG tablet Commonly known as: Zofran Take 1 tablet (4 mg total) by mouth every 8 (eight) hours  as needed for nausea or vomiting.   promethazine 25 MG suppository Commonly known as: PHENERGAN Place 1 suppository (25 mg total) rectally every 6 (six) hours as needed for nausea or vomiting.   Symbicort 160-4.5 MCG/ACT inhaler Generic drug: budesonide-formoterol Inhale 2 puffs into the lungs in the morning and at bedtime.   tadalafil 5 MG tablet Commonly known as: CIALIS Take 5 mg by mouth daily.

## 2022-03-23 ENCOUNTER — Ambulatory Visit (INDEPENDENT_AMBULATORY_CARE_PROVIDER_SITE_OTHER): Payer: 59 | Admitting: Family Medicine

## 2022-03-23 ENCOUNTER — Encounter: Payer: Self-pay | Admitting: Family Medicine

## 2022-03-23 VITALS — BP 122/74 | HR 76 | Temp 98.0°F | Ht 72.5 in | Wt 210.1 lb

## 2022-03-23 DIAGNOSIS — Z131 Encounter for screening for diabetes mellitus: Secondary | ICD-10-CM | POA: Diagnosis not present

## 2022-03-23 DIAGNOSIS — Z79899 Other long term (current) drug therapy: Secondary | ICD-10-CM

## 2022-03-23 DIAGNOSIS — Z13220 Encounter for screening for lipoid disorders: Secondary | ICD-10-CM | POA: Diagnosis not present

## 2022-03-23 DIAGNOSIS — Z0000 Encounter for general adult medical examination without abnormal findings: Secondary | ICD-10-CM

## 2022-03-23 DIAGNOSIS — C61 Malignant neoplasm of prostate: Secondary | ICD-10-CM

## 2022-03-23 DIAGNOSIS — R7989 Other specified abnormal findings of blood chemistry: Secondary | ICD-10-CM | POA: Diagnosis not present

## 2022-03-23 LAB — CBC WITH DIFFERENTIAL/PLATELET
Basophils Absolute: 0 10*3/uL (ref 0.0–0.1)
Basophils Relative: 0.7 % (ref 0.0–3.0)
Eosinophils Absolute: 0.1 10*3/uL (ref 0.0–0.7)
Eosinophils Relative: 1.9 % (ref 0.0–5.0)
HCT: 42.3 % (ref 39.0–52.0)
Hemoglobin: 14.7 g/dL (ref 13.0–17.0)
Lymphocytes Relative: 19.5 % (ref 12.0–46.0)
Lymphs Abs: 0.7 10*3/uL (ref 0.7–4.0)
MCHC: 34.6 g/dL (ref 30.0–36.0)
MCV: 97.2 fl (ref 78.0–100.0)
Monocytes Absolute: 0.3 10*3/uL (ref 0.1–1.0)
Monocytes Relative: 9.9 % (ref 3.0–12.0)
Neutro Abs: 2.4 10*3/uL (ref 1.4–7.7)
Neutrophils Relative %: 68 % (ref 43.0–77.0)
Platelets: 235 10*3/uL (ref 150.0–400.0)
RBC: 4.35 Mil/uL (ref 4.22–5.81)
RDW: 12.8 % (ref 11.5–15.5)
WBC: 3.5 10*3/uL — ABNORMAL LOW (ref 4.0–10.5)

## 2022-03-23 LAB — HEMOGLOBIN A1C: Hgb A1c MFr Bld: 5.3 % (ref 4.6–6.5)

## 2022-03-23 LAB — HEPATIC FUNCTION PANEL
ALT: 76 U/L — ABNORMAL HIGH (ref 0–53)
AST: 85 U/L — ABNORMAL HIGH (ref 0–37)
Albumin: 4.7 g/dL (ref 3.5–5.2)
Alkaline Phosphatase: 62 U/L (ref 39–117)
Bilirubin, Direct: 0.2 mg/dL (ref 0.0–0.3)
Total Bilirubin: 0.8 mg/dL (ref 0.2–1.2)
Total Protein: 7.4 g/dL (ref 6.0–8.3)

## 2022-03-23 LAB — LIPID PANEL
Cholesterol: 246 mg/dL — ABNORMAL HIGH (ref 0–200)
HDL: 95.4 mg/dL
LDL Cholesterol: 129 mg/dL — ABNORMAL HIGH (ref 0–99)
NonHDL: 150.92
Total CHOL/HDL Ratio: 3
Triglycerides: 112 mg/dL (ref 0.0–149.0)
VLDL: 22.4 mg/dL (ref 0.0–40.0)

## 2022-03-23 LAB — BASIC METABOLIC PANEL
BUN: 9 mg/dL (ref 6–23)
CO2: 28 mEq/L (ref 19–32)
Calcium: 9.6 mg/dL (ref 8.4–10.5)
Chloride: 100 mEq/L (ref 96–112)
Creatinine, Ser: 0.98 mg/dL (ref 0.40–1.50)
GFR: 84.29 mL/min (ref >60.00)
Glucose, Bld: 72 mg/dL (ref 70–99)
Potassium: 4.6 mEq/L (ref 3.5–5.1)
Sodium: 139 mEq/L (ref 135–145)

## 2022-03-24 ENCOUNTER — Encounter: Payer: Self-pay | Admitting: Family Medicine

## 2022-03-24 DIAGNOSIS — R7989 Other specified abnormal findings of blood chemistry: Secondary | ICD-10-CM

## 2022-03-24 DIAGNOSIS — I7121 Aneurysm of the ascending aorta, without rupture: Secondary | ICD-10-CM

## 2022-03-25 LAB — PSA, TOTAL WITH REFLEX TO PSA, FREE: PSA, Total: <0.1 ng/mL

## 2022-04-19 ENCOUNTER — Other Ambulatory Visit: Payer: Self-pay | Admitting: Family Medicine

## 2022-04-19 ENCOUNTER — Other Ambulatory Visit: Payer: Self-pay

## 2022-04-19 MED ORDER — OMEPRAZOLE 40 MG PO CPDR
40.0000 mg | DELAYED_RELEASE_CAPSULE | Freq: Every day | ORAL | 3 refills | Status: DC
  Filled 2022-04-19: qty 90, 90d supply, fill #0
  Filled 2022-08-11: qty 90, 90d supply, fill #1
  Filled 2022-11-07: qty 90, 90d supply, fill #2
  Filled 2023-04-04: qty 90, 90d supply, fill #3

## 2022-04-19 MED ORDER — AMLODIPINE BESYLATE 10 MG PO TABS
ORAL_TABLET | Freq: Every day | ORAL | 3 refills | Status: DC
  Filled 2022-04-19: qty 90, 90d supply, fill #0
  Filled 2022-08-11: qty 90, 90d supply, fill #1

## 2022-04-19 MED ORDER — LOSARTAN POTASSIUM 100 MG PO TABS
ORAL_TABLET | Freq: Every day | ORAL | 3 refills | Status: DC
  Filled 2022-04-19: qty 90, 90d supply, fill #0
  Filled 2022-08-11: qty 90, 90d supply, fill #1

## 2022-04-20 ENCOUNTER — Other Ambulatory Visit: Payer: Self-pay

## 2022-04-20 ENCOUNTER — Encounter: Payer: Self-pay | Admitting: *Deleted

## 2022-04-22 ENCOUNTER — Other Ambulatory Visit: Payer: Self-pay

## 2022-04-22 ENCOUNTER — Other Ambulatory Visit: Payer: Self-pay | Admitting: Family Medicine

## 2022-04-22 DIAGNOSIS — K5792 Diverticulitis of intestine, part unspecified, without perforation or abscess without bleeding: Secondary | ICD-10-CM

## 2022-04-22 MED ORDER — ONDANSETRON HCL 4 MG PO TABS
4.0000 mg | ORAL_TABLET | Freq: Three times a day (TID) | ORAL | 2 refills | Status: DC | PRN
  Filled 2022-04-22: qty 20, 7d supply, fill #0
  Filled 2022-08-25: qty 20, 7d supply, fill #1
  Filled 2022-12-29: qty 20, 7d supply, fill #2

## 2022-04-22 NOTE — Telephone Encounter (Signed)
Last office visit 03/23/22 for CPE.  Last refilled 12/22/21 for #20 with no refills. No future appointments with PCP.

## 2022-04-28 ENCOUNTER — Ambulatory Visit
Admission: RE | Admit: 2022-04-28 | Discharge: 2022-04-28 | Disposition: A | Discharge status: Home / Self Care | Payer: 59 | Source: Ambulatory Visit | Attending: Family Medicine | Admitting: Family Medicine

## 2022-04-28 DIAGNOSIS — I517 Cardiomegaly: Secondary | ICD-10-CM | POA: Diagnosis not present

## 2022-04-28 DIAGNOSIS — I2510 Atherosclerotic heart disease of native coronary artery without angina pectoris: Secondary | ICD-10-CM | POA: Diagnosis not present

## 2022-04-28 DIAGNOSIS — I700 Atherosclerosis of aorta: Secondary | ICD-10-CM | POA: Diagnosis not present

## 2022-04-28 DIAGNOSIS — I712 Thoracic aortic aneurysm, without rupture, unspecified: Secondary | ICD-10-CM | POA: Diagnosis not present

## 2022-04-28 DIAGNOSIS — I7121 Aneurysm of the ascending aorta, without rupture: Secondary | ICD-10-CM

## 2022-04-28 MED ORDER — IOPAMIDOL (ISOVUE-300) INJECTION 61%
75.0000 mL | Freq: Once | INTRAVENOUS | Status: AC | PRN
  Administered 2022-04-28: 75 mL via INTRAVENOUS

## 2022-04-29 ENCOUNTER — Other Ambulatory Visit: Payer: Self-pay | Admitting: Family Medicine

## 2022-04-29 ENCOUNTER — Ambulatory Visit
Admission: RE | Admit: 2022-04-29 | Discharge: 2022-04-29 | Disposition: A | Discharge status: Home / Self Care | Payer: 59 | Source: Ambulatory Visit | Attending: Family Medicine | Admitting: Family Medicine

## 2022-04-29 DIAGNOSIS — R7989 Other specified abnormal findings of blood chemistry: Secondary | ICD-10-CM | POA: Diagnosis not present

## 2022-04-29 DIAGNOSIS — I7121 Aneurysm of the ascending aorta, without rupture: Secondary | ICD-10-CM

## 2022-04-30 NOTE — Progress Notes (Signed)
We spoke on the phone, and he understands.  Will have him see CT surgery.

## 2022-05-20 ENCOUNTER — Other Ambulatory Visit: Payer: Self-pay | Admitting: Pulmonary Disease

## 2022-05-20 ENCOUNTER — Other Ambulatory Visit: Payer: Self-pay | Admitting: Family Medicine

## 2022-05-20 ENCOUNTER — Other Ambulatory Visit: Payer: Self-pay

## 2022-05-20 MED ORDER — MONTELUKAST SODIUM 10 MG PO TABS
10.0000 mg | ORAL_TABLET | Freq: Every day | ORAL | 5 refills | Status: DC
  Filled 2022-05-20: qty 30, 30d supply, fill #0
  Filled 2022-07-14: qty 90, 90d supply, fill #1
  Filled 2022-11-07: qty 60, 60d supply, fill #2

## 2022-05-20 MED ORDER — ALBUTEROL SULFATE HFA 108 (90 BASE) MCG/ACT IN AERS
INHALATION_SPRAY | RESPIRATORY_TRACT | 3 refills | Status: DC
  Filled 2022-05-20: qty 20.1, 75d supply, fill #0
  Filled 2022-12-21: qty 20.1, 75d supply, fill #1

## 2022-05-27 ENCOUNTER — Other Ambulatory Visit: Payer: Self-pay

## 2022-05-27 ENCOUNTER — Encounter: Payer: 59 | Admitting: Surgery

## 2022-05-27 ENCOUNTER — Institutional Professional Consult (permissible substitution) (INDEPENDENT_AMBULATORY_CARE_PROVIDER_SITE_OTHER): Payer: 59 | Admitting: Surgery

## 2022-05-27 ENCOUNTER — Encounter: Payer: Self-pay | Admitting: Surgery

## 2022-05-27 ENCOUNTER — Other Ambulatory Visit: Payer: Self-pay | Admitting: Surgery

## 2022-05-27 VITALS — BP 142/80 | HR 85 | Resp 20 | Ht 73.0 in | Wt 207.0 lb

## 2022-05-27 DIAGNOSIS — I7121 Aneurysm of the ascending aorta, without rupture: Secondary | ICD-10-CM | POA: Diagnosis not present

## 2022-05-27 MED ORDER — ALPRAZOLAM 0.25 MG PO TABS
0.2500 mg | ORAL_TABLET | Freq: Two times a day (BID) | ORAL | 3 refills | Status: DC | PRN
  Filled 2022-05-27: qty 60, 30d supply, fill #0
  Filled 2022-07-14: qty 60, 30d supply, fill #1
  Filled 2022-08-11: qty 60, 30d supply, fill #2
  Filled 2022-09-25: qty 60, 30d supply, fill #3

## 2022-05-27 NOTE — Progress Notes (Signed)
Cardiothoracic Surgery Consultation  PCP is Copland, Frederico Hamman, MD Referring Provider is Owens Loffler, MD  Chief Complaint  Patient presents with  . Thoracic Aortic Aneurysm    Surgical consult, Chest CT 04/28/22     HPI:   Past Medical History:  Diagnosis Date  . Actinic keratosis   . Allergic rhinitis due to pollen   . Asthma    vs COPD- taking inhalers- as related to allergies only.  . Basal cell carcinoma 03/26/2020   R lat deltoid - ED&C   . Dysplastic nevus 03/07/2020   L costal infrapectoral - moderate  . Dysplastic nevus 03/07/2020   R post flank above waistline - moderate  . Follicular cancer of thyroid [facility])    s/p partial thyroidectomy (follicular adenoma)-surgery only  . GERD (gastroesophageal reflux disease)   . Hearing impaired person, bilateral    hearing aida bilateral"high frequency loss  . Hyperlipidemia   . Hypertension   . Prostate cancer ([place]) 01/08/2016  . PTSD (post-traumatic stress disorder)    s/p multiple active deployments for [employer], no medications taken currently    Past Surgical History:  Procedure Laterality Date  . COLONOSCOPY W/ POLYPECTOMY     age 19 "adenoma"  . INGUINAL HERNIA REPAIR     right  . ROBOT ASSISTED LAPAROSCOPIC RADICAL PROSTATECTOMY N/A 07/23/2016   Procedure: XI ROBOTIC ASSISTED LAPAROSCOPIC RADICAL PROSTATECTOMY LEVEL 1;  Surgeon: Raynelle Bring, MD;  Location: [facility];  Service: Urology;  Laterality: N/A;  . THYROIDECTOMY, PARTIAL     [name] ([place])    Family History  Problem Relation Age of Onset  . Heart disease Paternal Grandmother   . Prostate cancer Father        Recurrent x 3  . Hyperlipidemia Father   . Other Father        varicose veins  . Hyperlipidemia Mother   . Other Mother        varicose veins  . Hypertension Maternal Grandmother   . Hypertension Maternal Grandfather   . Sudden death Brother 109  . Colon cancer Neg Hx   . Esophageal cancer Neg Hx   . Rectal cancer Neg Hx   .  Stomach cancer Neg Hx     Social History Social History   Tobacco Use  . Smoking status: Never  . Smokeless tobacco: Former    Types: Snuff    Quit date: 12/03/2012  Vaping Use  . Vaping Use: Never used  Substance Use Topics  . Alcohol use: Yes    Alcohol/week: 10.0 standard drinks of alcohol    Types: 10 Cans of beer per week    Comment: moderate  . Drug use: No    Current Outpatient Medications  Medication Sig Dispense Refill  . albuterol (VENTOLIN HFA) 108 (90 Base) MCG/ACT inhaler INHALE 2 PUFFS INTO THE LUNGS EVERY 6 (SIX) HOURS AS NEEDED FOR WHEEZING. 20.1 g 3  . ALPRAZolam (XANAX) 0.25 MG tablet Take 1 tablet (0.25 mg total) by mouth 2 (two) times daily as needed for anxiety or sleep. 60 tablet 3  . amLODipine (NORVASC) 10 MG tablet TAKE 1 TABLET (10 MG TOTAL) BY MOUTH DAILY. 90 tablet 3  . budesonide-formoterol (SYMBICORT) 160-4.5 MCG/ACT inhaler Inhale 2 puffs into the lungs in the morning and at bedtime. 10.2 g 5  . Carboxymethylcellul-Glycerin (LUBRICATING EYE DROPS OP) Apply 1 drop to eye daily as needed (dry eyes).    . cetirizine (ZYRTEC) 10 MG tablet Take 10 mg by mouth daily.    Marland Kitchen  cyclobenzaprine (FLEXERIL) 10 MG tablet Take 0.5-1 tablets (5-10 mg total) by mouth 3 (three) times daily as needed for muscle spasms. 20 tablet 0  . diclofenac Sodium (VOLTAREN) 1 % GEL Apply 1 a small amount to affected area twice a day 100 g 0  . fluorouracil (EFUDEX) 5 % cream Apply topically 2 (two) times daily. Bid to temples, cheeks for 7 days 30 g 1  . fluticasone (FLONASE) 50 MCG/ACT nasal spray Place 2 sprays into both nostrils daily. 16 g 6  . losartan (COZAAR) 100 MG tablet TAKE 1 TABLET BY MOUTH DAILY. 90 tablet 3  . montelukast (SINGULAIR) 10 MG tablet Take 1 tablet (10 mg total) by mouth at bedtime. 30 tablet 5  . Multiple Vitamin (MULTIVITAMIN WITH MINERALS) TABS tablet Take 1 tablet by mouth daily.    Marland Kitchen nystatin (MYCOSTATIN) 100000 UNIT/ML suspension Take 5 mLs (500,000  Units total) by mouth 4 (four) times daily. Swish, gargle and spit. 120 mL 0  . omeprazole (PRILOSEC) 40 MG capsule Take 1 capsule (40 mg total) by mouth daily. 90 capsule 3  . ondansetron (ZOFRAN) 4 MG tablet Take 1 tablet (4 mg total) by mouth every 8 (eight) hours as needed for nausea or vomiting. 20 tablet 2  . promethazine (PHENERGAN) 25 MG suppository Place 1 suppository (25 mg total) rectally every 6 (six) hours as needed for nausea or vomiting. 12 each 2  . tadalafil (CIALIS) 5 MG tablet Take 5 mg by mouth daily.     Current Facility-Administered Medications  Medication Dose Route Frequency Provider Last Rate Last Admin  . 0.9 %  sodium chloride infusion  500 mL Intravenous Once Pyrtle, Lajuan Lines, MD        Allergies  Allergen Reactions  . Vioxx [Rofecoxib] Anaphylaxis    Tolerates aspirin and naproxen.  . Celebrex [Celecoxib] Rash    Tolerates aspirin and naproxen.  . Codeine Rash    Patient reports this happened as a child. He has tolerated Codeine since that time  . Doxycycline Rash    Review of Systems  BP (!) 142/80   Pulse 85   Resp 20   Ht '6\' 1"'$  (1.854 m)   Wt 207 lb (93.9 kg)   SpO2 96% Comment: RA  BMI 27.31 kg/m  Physical Exam   Diagnostic Tests:   Impression:   Plan:   [name], MD [facility] [phone]

## 2022-06-16 ENCOUNTER — Emergency Department (HOSPITAL_COMMUNITY): Payer: 59

## 2022-06-16 ENCOUNTER — Other Ambulatory Visit: Payer: Self-pay

## 2022-06-16 ENCOUNTER — Encounter (HOSPITAL_COMMUNITY): Payer: Self-pay

## 2022-06-16 ENCOUNTER — Emergency Department (HOSPITAL_COMMUNITY)
Admission: EM | Admit: 2022-06-16 | Discharge: 2022-06-16 | Disposition: A | Discharge status: Home / Self Care | Payer: PRIVATE HEALTH INSURANCE | Attending: Emergency Medicine | Admitting: Emergency Medicine

## 2022-06-16 ENCOUNTER — Ambulatory Visit (HOSPITAL_COMMUNITY)
Admission: RE | Admit: 2022-06-16 | Discharge: 2022-06-16 | Disposition: A | Discharge status: Home / Self Care | Payer: 59 | Source: Ambulatory Visit | Attending: Surgery | Admitting: Surgery

## 2022-06-16 DIAGNOSIS — I10 Essential (primary) hypertension: Secondary | ICD-10-CM | POA: Insufficient documentation

## 2022-06-16 DIAGNOSIS — Y990 Civilian activity done for income or pay: Secondary | ICD-10-CM | POA: Diagnosis not present

## 2022-06-16 DIAGNOSIS — W19XXXA Unspecified fall, initial encounter: Secondary | ICD-10-CM | POA: Insufficient documentation

## 2022-06-16 DIAGNOSIS — Z85828 Personal history of other malignant neoplasm of skin: Secondary | ICD-10-CM | POA: Insufficient documentation

## 2022-06-16 DIAGNOSIS — Z859 Personal history of malignant neoplasm, unspecified: Secondary | ICD-10-CM | POA: Insufficient documentation

## 2022-06-16 DIAGNOSIS — E785 Hyperlipidemia, unspecified: Secondary | ICD-10-CM | POA: Diagnosis not present

## 2022-06-16 DIAGNOSIS — I2510 Atherosclerotic heart disease of native coronary artery without angina pectoris: Secondary | ICD-10-CM | POA: Insufficient documentation

## 2022-06-16 DIAGNOSIS — J45909 Unspecified asthma, uncomplicated: Secondary | ICD-10-CM | POA: Insufficient documentation

## 2022-06-16 DIAGNOSIS — I7121 Aneurysm of the ascending aorta, without rupture: Secondary | ICD-10-CM | POA: Diagnosis not present

## 2022-06-16 DIAGNOSIS — S4991XA Unspecified injury of right shoulder and upper arm, initial encounter: Secondary | ICD-10-CM | POA: Insufficient documentation

## 2022-06-16 DIAGNOSIS — Z79899 Other long term (current) drug therapy: Secondary | ICD-10-CM | POA: Diagnosis not present

## 2022-06-16 DIAGNOSIS — Z8546 Personal history of malignant neoplasm of prostate: Secondary | ICD-10-CM | POA: Diagnosis not present

## 2022-06-16 LAB — ECHOCARDIOGRAM COMPLETE
Area-P 1/2: 2.55 cm2
Height: 73 in
S' Lateral: 3.2 cm
Weight: 3312 oz

## 2022-06-16 MED ORDER — OXYCODONE HCL 5 MG PO TABS
5.0000 mg | ORAL_TABLET | ORAL | 0 refills | Status: DC | PRN
  Filled 2022-06-16: qty 10, 2d supply, fill #0

## 2022-06-16 MED ORDER — ACETAMINOPHEN 325 MG PO TABS
650.0000 mg | ORAL_TABLET | Freq: Four times a day (QID) | ORAL | 0 refills | Status: DC | PRN
  Filled 2022-06-16: qty 36, 5d supply, fill #0

## 2022-06-16 MED ORDER — CYCLOBENZAPRINE HCL 10 MG PO TABS
5.0000 mg | ORAL_TABLET | Freq: Two times a day (BID) | ORAL | 0 refills | Status: DC | PRN
  Filled 2022-06-16: qty 20, 10d supply, fill #0

## 2022-06-16 MED ORDER — NAPROXEN 375 MG PO TABS
375.0000 mg | ORAL_TABLET | Freq: Two times a day (BID) | ORAL | 0 refills | Status: AC
  Filled 2022-06-16: qty 14, 7d supply, fill #0

## 2022-06-16 NOTE — Discharge Instructions (Addendum)
It was a pleasure caring for you today in the emergency department.  Please return to the emergency department for any worsening or worrisome symptoms.  I have suspicion for possible ligamentous injury to your right shoulder. No heavy lifting next 5 days, light duty at work. Please wear sling during daytime, may take off to shower/to sleep if more comfortable. Please follow up with sports medicine in the next 5 days for recheck. May require MRI for further eval if symptoms persist.

## 2022-06-16 NOTE — ED Triage Notes (Signed)
Pt arrived from upstairs stating he had just clocked in and went into the locker room to change when he tripped over someone elses shoes and fell onto his right arm. Pt states now he cannot lift his arm.

## 2022-06-16 NOTE — Progress Notes (Signed)
  Echocardiogram 2D Echocardiogram has been performed.  Austin Houston 06/16/2022, 12:26 PM

## 2022-06-16 NOTE — ED Provider Triage Note (Signed)
Emergency Medicine Provider Triage Evaluation Note  Austin Houston , a 61 y.o. male  was evaluated in triage.  Pt complains of right shoulder pain status post fall.  Was in the locker room this morning at work when suddenly tripped and fell on to the right elbow hearing popping sensation to the right shoulder.  Exacerbated with any type of movement, has not taken anything for pain..  Review of Systems  Positive: Right shoulder pain Negative: Fever,headache  Physical Exam  BP (!) 142/95 (BP Location: Left Arm)   Pulse 97   Temp 98 F (36.7 C) (Oral)   Resp 18   Ht '6\' 1"'$  (1.854 m)   Wt 93.9 kg   SpO2 95%   BMI 27.31 kg/m  Gen:   Awake, no distress   Resp:  Normal effort  MSK:   Moves extremities without difficulty  Other:  Pain with palpation along the right humeral neck, exacerbated with external rotation.  Neurovascularly intact.  Medical Decision Making  Medically screening exam initiated at 7:08 AM.  Appropriate orders placed.  Austin Houston was informed that the remainder of the evaluation will be completed by another provider, this initial triage assessment does not replace that evaluation, and the importance of remaining in the ED until their evaluation is complete.     Austin Fitting, PA-C 06/16/22 0370

## 2022-06-16 NOTE — ED Provider Notes (Signed)
[facility] EMERGENCY DEPARTMENT Provider Note   CSN: [id number] Arrival date & time: 06/16/22  9628     History  Chief Complaint  Patient presents with   Arm Injury    Austin Houston is a 61 y.o. male.  Patient as above with significant medical history as below, including BCC, GERD, asthma, HTN HLD who presents to the ED with complaint of fall/right shoulder injury Pt reports fell in locker room prior to 0700 this am Fell onto his right shoulder Pain to anterior portion of shoulder, pain with movement of arm No numbness/tingling to RUE He is RHD No head /neck injury No n/v, no LOC Denies prior surgery to RUE, did have prior injection via sports med     Past Medical History:  Diagnosis Date   Actinic keratosis    Allergic rhinitis due to pollen    Asthma    vs COPD- taking inhalers- as related to allergies only.   Basal cell carcinoma 03/26/2020   R lat deltoid - ED&C    Dysplastic nevus 03/07/2020   L costal infrapectoral - moderate   Dysplastic nevus 03/07/2020   R post flank above waistline - moderate   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HCC)    s/p partial thyroidectomy (follicular adenoma)-surgery only   GERD (gastroesophageal reflux disease)    Hearing impaired person, bilateral    hearing aida bilateral"high frequency loss   Hyperlipidemia    Hypertension    Prostate cancer ([place]) 01/08/2016   PTSD (post-traumatic stress disorder)    s/p multiple active deployments for [employer], no medications taken currently    Past Surgical History:  Procedure Laterality Date   COLONOSCOPY W/ POLYPECTOMY     age 33 "adenoma"   INGUINAL HERNIA REPAIR     right   ROBOT ASSISTED LAPAROSCOPIC RADICAL PROSTATECTOMY N/A 07/23/2016   Procedure: XI ROBOTIC ASSISTED LAPAROSCOPIC RADICAL PROSTATECTOMY LEVEL 1;  Surgeon: Raynelle Bring, MD;  Location: [facility] ORS;  Service: Urology;  Laterality: N/A;   THYROIDECTOMY, PARTIAL     [name])     The history is  provided by the patient. No language interpreter was used.  Arm Injury Associated symptoms: no fever        Home Medications Prior to Admission medications   Medication Sig Start Date End Date Taking? Authorizing Provider  acetaminophen (TYLENOL) 325 MG tablet Take 2 tablets (650 mg total) by mouth every 6 (six) hours as needed. 06/16/22  Yes Wynona Dove A, DO  cyclobenzaprine (FLEXERIL) 10 MG tablet Take 0.5-1 tablets (5-10 mg total) by mouth 2 (two) times daily as needed for muscle spasms. 06/16/22  Yes Wynona Dove A, DO  naproxen (NAPROSYN) 375 MG tablet Take 1 tablet (375 mg total) by mouth 2 (two) times daily for 7 days. 06/16/22 06/23/22 Yes Wynona Dove A, DO  oxyCODONE (ROXICODONE) 5 MG immediate release tablet Take 1 tablet (5 mg total) by mouth every 4 (four) hours as needed for severe pain. 06/16/22  Yes Wynona Dove A, DO  albuterol (VENTOLIN HFA) 108 (90 Base) MCG/ACT inhaler INHALE 2 PUFFS INTO THE LUNGS EVERY 6 (SIX) HOURS AS NEEDED FOR WHEEZING. 05/20/22 05/20/23  Copland, Frederico Hamman, MD  ALPRAZolam Duanne Moron) 0.25 MG tablet Take 1 tablet (0.25 mg total) by mouth 2 (two) times daily as needed for anxiety or sleep. 05/27/22   [name], MD  amLODipine (NORVASC) 10 MG tablet TAKE 1 TABLET (10 MG TOTAL) BY MOUTH DAILY. 04/19/22 04/19/23  Owens Loffler, MD  budesonide-formoterol (SYMBICORT) 160-4.5 MCG/ACT inhaler Inhale 2 puffs into the lungs in the morning and at bedtime. 08/25/21   Mannam, Hart Robinsons, MD  Carboxymethylcellul-Glycerin (LUBRICATING EYE DROPS OP) Apply 1 drop to eye daily as needed (dry eyes).    [provider]  cetirizine (ZYRTEC) 10 MG tablet Take 10 mg by mouth daily.    [provider]  cyclobenzaprine (FLEXERIL) 10 MG tablet Take 0.5-1 tablets (5-10 mg total) by mouth 3 (three) times daily as needed for muscle spasms. 05/16/18   Bedsole, Amy E, MD  diclofenac Sodium (VOLTAREN) 1 % GEL Apply 1 a small amount to affected area twice a day 01/01/22      fluorouracil (EFUDEX) 5 % cream Apply topically 2 (two) times daily. Bid to temples, cheeks for 7 days 03/18/22   Ralene Bathe, MD  fluticasone [facility]) 50 MCG/ACT nasal spray Place 2 sprays into both nostrils daily. 10/30/20   Evelina Dun A, FNP  losartan (COZAAR) 100 MG tablet TAKE 1 TABLET BY MOUTH DAILY. 04/19/22 04/19/23  Copland, Frederico Hamman, MD  montelukast (SINGULAIR) 10 MG tablet Take 1 tablet (10 mg total) by mouth at bedtime. 05/20/22   Marshell Garfinkel, MD  Multiple Vitamin (MULTIVITAMIN WITH MINERALS) TABS tablet Take 1 tablet by mouth daily.    [provider]  nystatin (MYCOSTATIN) 100000 UNIT/ML suspension Take 5 mLs (500,000 Units total) by mouth 4 (four) times daily. Swish, gargle and spit. 11/25/21   Brunetta Jeans, PA-C  omeprazole (PRILOSEC) 40 MG capsule Take 1 capsule (40 mg total) by mouth daily. 04/19/22   Copland, Frederico Hamman, MD  ondansetron (ZOFRAN) 4 MG tablet Take 1 tablet (4 mg total) by mouth every 8 (eight) hours as needed for nausea or vomiting. 04/22/22   Copland, Frederico Hamman, MD  promethazine (PHENERGAN) 25 MG suppository Place 1 suppository (25 mg total) rectally every 6 (six) hours as needed for nausea or vomiting. 12/22/21   Copland, Frederico Hamman, MD  tadalafil (CIALIS) 5 MG tablet Take 5 mg by mouth daily.    [provider]  beclomethasone (QVAR) 80 MCG/ACT inhaler Inhale 2 puffs into the lungs daily. 04/27/18 12/19/19  Juanito Doom, MD      Allergies    Vioxx [rofecoxib], Celebrex [celecoxib], Codeine, and Doxycycline    Review of Systems   Review of Systems  Constitutional:  Negative for chills and fever.  HENT:  Negative for facial swelling and trouble swallowing.   Eyes:  Negative for photophobia and visual disturbance.  Respiratory:  Negative for cough and shortness of breath.   Cardiovascular:  Negative for chest pain and palpitations.  Gastrointestinal:  Negative for abdominal pain, nausea and vomiting.  Endocrine: Negative for  polydipsia and polyuria.  Genitourinary:  Negative for difficulty urinating and hematuria.  Musculoskeletal:  Positive for arthralgias. Negative for gait problem and joint swelling.  Skin:  Negative for pallor and rash.  Neurological:  Negative for syncope and headaches.  Psychiatric/Behavioral:  Negative for agitation and confusion.     Physical Exam Updated Vital Signs BP (!) 146/101 (BP Location: Left Arm)   Pulse (!) 102   Temp 98 F (36.7 C) (Oral)   Resp 18   Ht '6\' 1"'$  (1.854 m)   Wt 93.9 kg   SpO2 98%   BMI 27.31 kg/m  Physical Exam Vitals and nursing note reviewed.  Constitutional:      General: He is not in acute distress.    Appearance: Normal appearance. He is well-developed. He is not ill-appearing, toxic-appearing or  diaphoretic.  HENT:     Head: Normocephalic and atraumatic. No raccoon eyes, Battle's sign, right periorbital erythema or left periorbital erythema.     Jaw: There is normal jaw occlusion. No trismus.     Right Ear: External ear normal.     Left Ear: External ear normal.     Mouth/Throat:     Mouth: Mucous membranes are moist.  Eyes:     General: Vision grossly intact. No scleral icterus.       Right eye: No discharge.        Left eye: No discharge.  Cardiovascular:     Rate and Rhythm: Normal rate.     Pulses: Normal pulses.  Pulmonary:     Effort: Pulmonary effort is normal. No respiratory distress.     Breath sounds: No stridor.  Abdominal:     General: Abdomen is flat. There is no distension.  Musculoskeletal:        General: Normal range of motion.       Arms:     Cervical back: No rigidity.     Right lower leg: No edema.     Left lower leg: No edema.     Comments: Radial 2+  NVI RUE Pain with abduction RUE Pain anterior aspect of right humerus, some ttp at bicipital groove right.  No neck pain or pain w/ neck movement No pain to wrist or elbow RUE  Skin:    General: Skin is warm and dry.     Capillary Refill: Capillary refill  takes less than 2 seconds.  Neurological:     Mental Status: He is alert and oriented to person, place, and time.  Psychiatric:        Mood and Affect: Mood normal.        Behavior: Behavior normal.     ED Results / Procedures / Treatments   Labs (all labs ordered are listed, but only abnormal results are displayed) Labs Reviewed - No data to display  EKG None  Radiology DG Shoulder Right  Result Date: 06/16/2022 CLINICAL DATA:  Fall.  Pain. EXAM: RIGHT SHOULDER - 2+ VIEW COMPARISON:  None Available. FINDINGS: There is no evidence of fracture or dislocation. There is no evidence of arthropathy or other focal bone abnormality. Soft tissues are unremarkable. IMPRESSION: Negative. Electronically Signed   By: Misty Stanley M.D.   On: 06/16/2022 07:52    Procedures Procedures    Medications Ordered in ED Medications - No data to display  ED Course/ Medical Decision Making/ A&P                           Medical Decision Making Risk OTC drugs. Prescription drug management.   This patient presents to the ED with chief complaint(s) of shoulder injury RUE with pertinent past medical history of as above which further complicates the presenting complaint. The complaint involves an extensive differential diagnosis and also carries with it a high risk of complications and morbidity.    The differential diagnosis includes but not limited to sprain, strain, fx, dislocation, ligamentous, vascular injury etc. Serious etiologies were considered.   The initial plan is to XR, sling   Additional history obtained: Additional history obtained from  na Records reviewed [place] and prior labs/imaging/home meds  Independent labs interpretation:  The following labs were independently interpreted: na  Independent visualization of imaging: - I independently visualized the following imaging with scope of interpretation limited to  determining acute life threatening conditions  related to emergency care: shoulder XR right, which revealed no acute fx or dislocation   Cardiac monitoring was reviewed and interpreted by myself which shows na  Treatment and Reassessment: Sling >> improved  Consultation: - Consulted or discussed management/test interpretation w/ external professional: na  Consideration for admission or further workup: Admission was considered    Pt here following apparent mechanical fall w/ right shoulder injury. No fx on exam. He does have pain with passive/active ROM, abduction is difficult. Unable to lift arm over shoulder. I have concern for ligamentous injury at this point. May require advanced imaging if symptoms persist after supportive care. Will defer further mgmt to sports med. NVI RUE. Cap refill is brisk. Advised him to f/u with sports med in 5-7 days for recheck, supportive care until then with sling.   The patient improved significantly and was discharged in stable condition. Detailed discussions were had with the patient regarding current findings, and need for close f/u with PCP or on call doctor. The patient has been instructed to return immediately if the symptoms worsen in any way for re-evaluation. Patient verbalized understanding and is in agreement with current care plan. All questions answered prior to discharge.     Social Determinants of health: Social History   Tobacco Use   Smoking status: Never   Smokeless tobacco: Former    Types: Snuff    Quit date: 12/03/2012  Vaping Use   Vaping Use: Never used  Substance Use Topics   Alcohol use: Yes    Alcohol/week: 10.0 standard drinks of alcohol    Types: 10 Cans of beer per week    Comment: moderate   Drug use: No            Final Clinical Impression(s) / ED Diagnoses Final diagnoses:  Injury of right shoulder, initial encounter  Fall, initial encounter    Rx / DC Orders ED Discharge Orders          Ordered    naproxen (NAPROSYN) 375 MG tablet  2 times  daily        06/16/22 1027    acetaminophen (TYLENOL) 325 MG tablet  Every 6 hours PRN        06/16/22 1027    oxyCODONE (ROXICODONE) 5 MG immediate release tablet  Every 4 hours PRN        06/16/22 1027    cyclobenzaprine (FLEXERIL) 10 MG tablet  2 times daily PRN        06/16/22 1027              Jeanell Sparrow, DO 06/16/22 1047

## 2022-06-17 ENCOUNTER — Other Ambulatory Visit: Payer: Self-pay | Admitting: *Deleted

## 2022-06-17 DIAGNOSIS — I7121 Aneurysm of the ascending aorta, without rupture: Secondary | ICD-10-CM

## 2022-07-02 ENCOUNTER — Telehealth: Payer: Self-pay | Admitting: *Deleted

## 2022-07-02 NOTE — Telephone Encounter (Signed)
[facility] 07/20/22-Dr Burt Knack will plan to do H&P at hospital morning of cath. Call placed to patient to make arrangements for pre-procedure BMP/CBC, left message for patient to call back to discuss.

## 2022-07-07 NOTE — Telephone Encounter (Signed)
Left message for patient to call back to discuss getting BMP/CBC prior to cath 07/20/22.

## 2022-07-07 NOTE — Telephone Encounter (Signed)
Pt returned call, he has talked with Anderson Malta, RN Cath Lab -he states plan is to get pre-procedure BMP/CBC at hospital morning of cath 07/20/22.  Patient reports he knows pre-cath instructions for 07/20/22.

## 2022-07-14 ENCOUNTER — Other Ambulatory Visit: Payer: Self-pay

## 2022-07-20 ENCOUNTER — Encounter (HOSPITAL_COMMUNITY)
Admission: RE | Disposition: A | Discharge status: Home / Self Care | Payer: Self-pay | Source: Home / Self Care | Attending: Cardiovascular Disease

## 2022-07-20 ENCOUNTER — Encounter (HOSPITAL_COMMUNITY): Payer: Self-pay | Admitting: Cardiovascular Disease

## 2022-07-20 ENCOUNTER — Ambulatory Visit (HOSPITAL_COMMUNITY)
Admission: RE | Admit: 2022-07-20 | Discharge: 2022-07-20 | Disposition: A | Discharge status: Home / Self Care | Payer: 59 | Attending: Cardiovascular Disease | Admitting: Cardiovascular Disease

## 2022-07-20 ENCOUNTER — Other Ambulatory Visit: Payer: Self-pay

## 2022-07-20 DIAGNOSIS — I2510 Atherosclerotic heart disease of native coronary artery without angina pectoris: Secondary | ICD-10-CM | POA: Insufficient documentation

## 2022-07-20 DIAGNOSIS — I7121 Aneurysm of the ascending aorta, without rupture: Secondary | ICD-10-CM | POA: Diagnosis not present

## 2022-07-20 DIAGNOSIS — Z87891 Personal history of nicotine dependence: Secondary | ICD-10-CM | POA: Insufficient documentation

## 2022-07-20 DIAGNOSIS — Z01812 Encounter for preprocedural laboratory examination: Secondary | ICD-10-CM

## 2022-07-20 HISTORY — PX: LEFT HEART CATH AND CORONARY ANGIOGRAPHY: CATH118249

## 2022-07-20 LAB — BASIC METABOLIC PANEL
Anion gap: 7 (ref 5–15)
BUN: 10 mg/dL (ref 6–20)
CO2: 26 mmol/L (ref 22–32)
Calcium: 8.5 mg/dL — ABNORMAL LOW (ref 8.9–10.3)
Chloride: 102 mmol/L (ref 98–111)
Creatinine, Ser: 0.99 mg/dL (ref 0.61–1.24)
GFR, Estimated: >60 mL/min (ref >60)
Glucose, Bld: 90 mg/dL (ref 70–99)
Potassium: 4 mmol/L (ref 3.5–5.1)
Sodium: 135 mmol/L (ref 135–145)

## 2022-07-20 LAB — CBC
HCT: 40.9 % (ref 39.0–52.0)
Hemoglobin: 14.3 g/dL (ref 13.0–17.0)
MCH: 34.1 pg — ABNORMAL HIGH (ref 26.0–34.0)
MCHC: 35 g/dL (ref 30.0–36.0)
MCV: 97.6 fL (ref 80.0–100.0)
Platelets: 237 10*3/uL (ref 150–400)
RBC: 4.19 MIL/uL — ABNORMAL LOW (ref 4.22–5.81)
RDW: 11.9 % (ref 11.5–15.5)
WBC: 3.8 10*3/uL — ABNORMAL LOW (ref 4.0–10.5)
nRBC: 0 % (ref 0.0–0.2)

## 2022-07-20 SURGERY — LEFT HEART CATH AND CORONARY ANGIOGRAPHY
Anesthesia: LOCAL

## 2022-07-20 MED ORDER — FENTANYL CITRATE (PF) 100 MCG/2ML IJ SOLN
INTRAMUSCULAR | Status: AC
  Filled 2022-07-20: qty 2

## 2022-07-20 MED ORDER — FENTANYL CITRATE (PF) 100 MCG/2ML IJ SOLN
INTRAMUSCULAR | Status: DC | PRN
  Administered 2022-07-20: 25 ug via INTRAVENOUS

## 2022-07-20 MED ORDER — HYDRALAZINE HCL 20 MG/ML IJ SOLN: 10.0000 mg | INTRAMUSCULAR | Status: DC | PRN

## 2022-07-20 MED ORDER — SODIUM CHLORIDE 0.9 % WEIGHT BASED INFUSION: 1.0000 mL/kg/h | INTRAVENOUS | Status: DC

## 2022-07-20 MED ORDER — LIDOCAINE HCL (PF) 1 % IJ SOLN
INTRAMUSCULAR | Status: AC
  Filled 2022-07-20: qty 30

## 2022-07-20 MED ORDER — SODIUM CHLORIDE 0.9 % IV SOLN: 250.0000 mL | INTRAVENOUS | Status: DC | PRN

## 2022-07-20 MED ORDER — HEPARIN (PORCINE) IN NACL 1000-0.9 UT/500ML-% IV SOLN
INTRAVENOUS | Status: AC
  Filled 2022-07-20: qty 1000

## 2022-07-20 MED ORDER — HEPARIN SODIUM (PORCINE) 1000 UNIT/ML IJ SOLN
INTRAMUSCULAR | Status: AC
  Filled 2022-07-20: qty 10

## 2022-07-20 MED ORDER — LABETALOL HCL 5 MG/ML IV SOLN: 10.0000 mg | INTRAVENOUS | Status: DC | PRN

## 2022-07-20 MED ORDER — SODIUM CHLORIDE 0.9% FLUSH: 3.0000 mL | INTRAVENOUS | Status: DC | PRN

## 2022-07-20 MED ORDER — HEPARIN (PORCINE) IN NACL 1000-0.9 UT/500ML-% IV SOLN
INTRAVENOUS | Status: DC | PRN
  Administered 2022-07-20 (×2): 500 mL

## 2022-07-20 MED ORDER — IOHEXOL 350 MG/ML SOLN
INTRAVENOUS | Status: DC | PRN
  Administered 2022-07-20: 55 mL via INTRA_ARTERIAL

## 2022-07-20 MED ORDER — ACETAMINOPHEN 325 MG PO TABS: 650.0000 mg | ORAL_TABLET | ORAL | Status: DC | PRN

## 2022-07-20 MED ORDER — VERAPAMIL HCL 2.5 MG/ML IV SOLN
INTRAVENOUS | Status: AC
  Filled 2022-07-20: qty 2

## 2022-07-20 MED ORDER — SODIUM CHLORIDE 0.9% FLUSH: 3.0000 mL | Freq: Two times a day (BID) | INTRAVENOUS | Status: DC

## 2022-07-20 MED ORDER — MIDAZOLAM HCL 2 MG/2ML IJ SOLN
INTRAMUSCULAR | Status: AC
  Filled 2022-07-20: qty 2

## 2022-07-20 MED ORDER — HEPARIN SODIUM (PORCINE) 1000 UNIT/ML IJ SOLN
INTRAMUSCULAR | Status: DC | PRN
  Administered 2022-07-20: 5000 [IU] via INTRAVENOUS

## 2022-07-20 MED ORDER — MIDAZOLAM HCL 2 MG/2ML IJ SOLN
INTRAMUSCULAR | Status: DC | PRN
  Administered 2022-07-20: 1 mg via INTRAVENOUS

## 2022-07-20 MED ORDER — SODIUM CHLORIDE 0.9 % WEIGHT BASED INFUSION
3.0000 mL/kg/h | INTRAVENOUS | Status: AC
  Administered 2022-07-20: 3 mL/kg/h via INTRAVENOUS

## 2022-07-20 MED ORDER — VERAPAMIL HCL 2.5 MG/ML IV SOLN: INTRAVENOUS | Status: DC | PRN

## 2022-07-20 MED ORDER — LIDOCAINE HCL (PF) 1 % IJ SOLN
INTRAMUSCULAR | Status: DC | PRN
  Administered 2022-07-20: 2 mL

## 2022-07-20 MED ORDER — ONDANSETRON HCL 4 MG/2ML IJ SOLN: 4.0000 mg | Freq: Four times a day (QID) | INTRAMUSCULAR | Status: DC | PRN

## 2022-07-20 SURGICAL SUPPLY — 10 items
CATH 5FR JL3.5 JR4 ANG PIG MP (CATHETERS) IMPLANT
CATH INFINITI 5FR JL4 (CATHETERS) IMPLANT
DEVICE RAD COMP TR BAND LRG (VASCULAR PRODUCTS) IMPLANT
GLIDESHEATH SLEND SS 6F .021 (SHEATH) IMPLANT
GUIDEWIRE INQWIRE 1.5J.035X260 (WIRE) IMPLANT
INQWIRE 1.5J .035X260CM (WIRE) ×1
KIT HEART LEFT (KITS) ×1 IMPLANT
PACK CARDIAC CATHETERIZATION (CUSTOM PROCEDURE TRAY) ×1 IMPLANT
TRANSDUCER W/STOPCOCK (MISCELLANEOUS) ×1 IMPLANT
TUBING CIL FLEX 10 FLL-RA (TUBING) ×1 IMPLANT

## 2022-07-20 NOTE — Progress Notes (Signed)
Patient and wife was given discharge instructions. Both verbalized understanding. 

## 2022-07-20 NOTE — H&P (Signed)
Cardiology Admission History and Physical   Patient ID: Austin Houston MRN: [id number]; DOB: 21-Feb-1961   Admission date: 07/20/2022  PCP:  Owens Loffler, MD   [place] Providers Cardiologist:  None        Chief Complaint:  Aortic aneurysm  Patient Profile:   Austin Houston is a 62 y.o. male with ascending aortic aneurysm who is being seen 07/20/2022 for preoperative cardiac catheterization.  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 He has been fond to have a 5 cm ascending aortic aneurysm and he has a family hx of aortic dissection in his brother. He has been seen by Dr Cyndia Bent who has ordered a preoperative cardiac catheterization to evaluate for obstructive CAD. The patient's CTA showed calcification in the left main and LAD.   He has no chest pain or shortness of breath. No other complaints.    Past Medical History:  Diagnosis Date   Actinic keratosis    Allergic rhinitis due to pollen    Asthma    vs COPD- taking inhalers- as related to allergies only.   Basal cell carcinoma 03/26/2020   R lat deltoid - ED&C    Dysplastic nevus 03/07/2020   L costal infrapectoral - moderate   Dysplastic nevus 03/07/2020   R post flank above waistline - moderate   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HCC)    s/p partial thyroidectomy (follicular adenoma)-surgery only   GERD (gastroesophageal reflux disease)    Hearing impaired person, bilateral    hearing aida bilateral"high frequency loss   Hyperlipidemia    Hypertension    Prostate cancer ([place]) 01/08/2016   PTSD (post-traumatic stress disorder)    s/p multiple active deployments for [employer], no medications taken currently    Past Surgical History:  Procedure Laterality Date   COLONOSCOPY W/ POLYPECTOMY     age 5 "adenoma"   INGUINAL HERNIA REPAIR     right   ROBOT ASSISTED LAPAROSCOPIC RADICAL PROSTATECTOMY N/A 07/23/2016    Procedure: XI ROBOTIC ASSISTED LAPAROSCOPIC RADICAL PROSTATECTOMY LEVEL 1;  Surgeon: Raynelle Bring, MD;  Location: [facility];  Service: Urology;  Laterality: N/A;   THYROIDECTOMY, PARTIAL     [name])     Medications Prior to Admission: Prior to Admission medications   Medication Sig Start Date End Date Taking? Authorizing Provider  albuterol (VENTOLIN HFA) 108 (90 Base) MCG/ACT inhaler INHALE 2 PUFFS INTO THE LUNGS EVERY 6 (SIX) HOURS AS NEEDED FOR WHEEZING. 05/20/22 05/20/23 Yes Copland, Frederico Hamman, MD  ALPRAZolam Duanne Moron) 0.25 MG tablet Take 1 tablet (0.25 mg total) by mouth 2 (two) times daily as needed for anxiety or sleep. 05/27/22  Yes Bartle, Fernande Boyden, MD  amLODipine (NORVASC) 10 MG tablet TAKE 1 TABLET (10 MG TOTAL) BY MOUTH DAILY. 04/19/22 04/19/23 Yes Copland, Frederico Hamman, MD  aspirin EC 81 MG tablet Take 81 mg by mouth daily. Swallow whole.   Yes [provider]  budesonide-formoterol (SYMBICORT) 160-4.5 MCG/ACT inhaler Inhale 2 puffs into the lungs in the morning and at bedtime. 08/25/21  Yes Mannam, Praveen, MD  Carboxymethylcellul-Glycerin (LUBRICATING EYE DROPS OP) Apply 1 drop to eye daily as needed (dry eyes).   Yes [provider]  cetirizine (ZYRTEC) 10 MG tablet Take 10 mg by mouth daily.   Yes [provider]  diclofenac Sodium (VOLTAREN) 1 % GEL Apply 1 a small amount to affected area twice a  day Patient taking differently: Apply 2 g topically daily as needed (pain). 01/01/22  Yes   losartan (COZAAR) 100 MG tablet TAKE 1 TABLET BY MOUTH DAILY. 04/19/22 04/19/23 Yes Copland, Frederico Hamman, MD  montelukast (SINGULAIR) 10 MG tablet Take 1 tablet (10 mg total) by mouth at bedtime. 05/20/22  Yes Mannam, Praveen, MD  Multiple Vitamin (MULTIVITAMIN WITH MINERALS) TABS tablet Take 1 tablet by mouth daily.   Yes [provider]  nystatin (MYCOSTATIN) 100000 UNIT/ML suspension Take 5 mLs (500,000 Units total) by mouth 4 (four) times daily. Swish, gargle and  spit. Patient taking differently: Take 5 mLs by mouth 4 (four) times daily as needed (Swish, gargle and spit.). 11/25/21  Yes Brunetta Jeans, PA-C  omeprazole (PRILOSEC) 40 MG capsule Take 1 capsule (40 mg total) by mouth daily. 04/19/22  Yes Copland, Frederico Hamman, MD  tadalafil (CIALIS) 5 MG tablet Take 5 mg by mouth daily.   Yes [provider]  acetaminophen (TYLENOL) 325 MG tablet Take 2 tablets (650 mg total) by mouth every 6 (six) hours as needed. 06/16/22   Jeanell Sparrow, DO  cyclobenzaprine (FLEXERIL) 10 MG tablet Take 0.5-1 tablets (5-10 mg total) by mouth 2 (two) times daily as needed for muscle spasms. 06/16/22   Jeanell Sparrow, DO  fluorouracil (EFUDEX) 5 % cream Apply topically 2 (two) times daily. Bid to temples, cheeks for 7 days Patient not taking: Reported on 07/17/2022 03/18/22   Ralene Bathe, MD  fluticasone [facility]) 50 MCG/ACT nasal spray Place 2 sprays into both nostrils daily. 10/30/20   Evelina Dun A, FNP  ondansetron (ZOFRAN) 4 MG tablet Take 1 tablet (4 mg total) by mouth every 8 (eight) hours as needed for nausea or vomiting. 04/22/22   Copland, Frederico Hamman, MD  promethazine (PHENERGAN) 25 MG suppository Place 1 suppository (25 mg total) rectally every 6 (six) hours as needed for nausea or vomiting. 12/22/21   Copland, Frederico Hamman, MD  beclomethasone (QVAR) 80 MCG/ACT inhaler Inhale 2 puffs into the lungs daily. 04/27/18 12/19/19  Juanito Doom, MD     Allergies:    Allergies  Allergen Reactions   Vioxx [Rofecoxib] Anaphylaxis    Tolerates aspirin and naproxen.   Celebrex [Celecoxib] Rash    Tolerates aspirin and naproxen.   Codeine Rash    Patient reports this happened as a child. He has tolerated Codeine since that time   Doxycycline Rash    Social History:   Social History   Socioeconomic History   Marital status: Married    Spouse name: Not on file   Number of children: Not on file   Years of education: Not on file   Highest education level: Not on  file  Occupational History   Occupation: Cardiac Specialist    Employer: [employer]    Comment: EMTP Cath Lab and Hybrid OR at [employer] Use   Smoking status: Never   Smokeless tobacco: Former    Types: Snuff    Quit date: 12/03/2012  Vaping Use   Vaping Use: Never used  Substance and Sexual Activity   Alcohol use: Yes    Alcohol/week: 10.0 standard drinks of alcohol    Types: 10 Cans of beer per week    Comment: moderate   Drug use: No   Sexual activity: Yes    Partners: Female  Other Topics Concern   Not on file  Social History Narrative   "[name]" is his preferred name   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   Active duty and Magazine features editor   Social Determinants of Health   Financial Resource Strain: Not on file  Food Insecurity: Not on file  Transportation Needs: Not on file  Physical Activity: Not on file  Stress: Not on file  Social Connections: Not on file  Intimate Partner Violence: Not on file    Family History:   The patient's family history includes Heart disease in his paternal grandmother; Hyperlipidemia in his father and mother; Hypertension in his maternal grandfather and maternal grandmother; Other in his father and mother; Prostate cancer in his father; Sudden death (age of onset: 39) in his brother. There is no history of Colon cancer, Esophageal cancer, Rectal cancer, or Stomach cancer.    ROS:  Please see the history of present illness.  All other ROS reviewed and negative.     Physical Exam/Data:   Vitals:   07/20/22 0500 07/20/22 0533  BP:  134/80  Pulse:  84  Resp:  16  Temp:  (!) 97.2 F (36.2 C)  TempSrc:  Temporal  SpO2:  97%  Weight: 93.9 kg 93.9 kg  Height: '6\' 1"'$  (1.854 m) '6\' 1"'$  (1.854 m)   No intake or output data in the 24 hours ending 07/20/22 0638    07/20/2022    5:33 AM 07/20/2022    5:00 AM 06/16/2022    7:05 AM  Last 3 Weights   Weight (lbs) 207 lb 207 lb 0.2 oz 207 lb  Weight (kg) 93.895 kg 93.9 kg 93.895 kg     Body mass index is 27.31 kg/m.  General:  Well nourished, well developed, in no acute distress HEENT: normal Neck: no JVD Vascular: No carotid bruits; Distal pulses 2+ bilaterally   Cardiac:  normal S1, S2; RRR; no murmur  Lungs:  clear to auscultation bilaterally, no wheezing, rhonchi or rales  Abd: soft, nontender, no hepatomegaly  Ext: no edema Musculoskeletal:  No deformities, BUE and BLE strength normal and equal Skin: warm and dry  Neuro:  CNs 2-12 intact, no focal abnormalities noted Psych:  Normal affect    Relevant CV Studies: Echo: 1. Left ventricular ejection fraction, by estimation, is 70 to 75%. The  left ventricle has hyperdynamic function. Left ventricular endocardial  border not optimally defined to evaluate regional wall motion.  Indeterminate diastolic filling due to E-A  fusion.   2. Right ventricular systolic function is hyperdynamic. The right  ventricular size is normal.   3. The mitral valve was not well visualized. No evidence of mitral valve  regurgitation. No evidence of mitral stenosis.   4. The aortic valve is tricuspid. Aortic valve regurgitation is not  visualized.   5. Aortic dilatation noted. There is moderate dilatation of the ascending  aorta, measuring 48 mm. There is mild dilatation of the aortic arch,  measuring 43 mm.   6. Technically difficult study with evidence of aortic dilation in the  ascending aorta and arch.   CTA Chest: IMPRESSION: 1. Ascending aortic aneurysm measuring up to 5 cm. Ascending thoracic aortic aneurysm. Recommend semi-annual imaging followup by CTA or MRA and referral to cardiothoracic surgery if not already obtained. This recommendation follows 2010 ACCF/AHA/AATS/ACR/ASA/SCA/SCAI/SIR/STS/SVM Guidelines for the Diagnosis and Management of Patients With Thoracic Aortic Disease. Circulation. 2010; 121: Y301-S010. Aortic  aneurysm NOS (ICD10-I71.9) 2. Hepatic steatosis   Aortic Atherosclerosis (ICD10-I70.0).Aortic aneurysm NOS (ICD10-I71.9).  Laboratory Data:  High Sensitivity Troponin:  No results for input(s): "TROPONINIHS" in the last 720 hours.    Chemistry Recent Labs  Lab 07/20/22 0517  NA 135  K 4.0  CL 102  CO2 26  GLUCOSE 90  BUN 10  CREATININE 0.99  CALCIUM 8.5*  GFRNONAA >60  ANIONGAP 7    No results for input(s): "PROT", "ALBUMIN", "AST", "ALT", "ALKPHOS", "BILITOT" in the last 168 hours. Lipids No results for input(s): "CHOL", "TRIG", "HDL", "LABVLDL", "LDLCALC", "CHOLHDL" in the last 168 hours. Hematology Recent Labs  Lab 07/20/22 0517  WBC 3.8*  RBC 4.19*  HGB 14.3  HCT 40.9  MCV 97.6  MCH 34.1*  MCHC 35.0  RDW 11.9  PLT 237   Thyroid No results for input(s): "TSH", "FREET4" in the last 168 hours. BNPNo results for input(s): "BNP", "PROBNP" in the last 168 hours.  DDimer No results for input(s): "DDIMER" in the last 168 hours.   Radiology/Studies:  No results found.   Assessment and Plan:   Ascending thoracic aortic aneurysm: 5 cm by recent CT. Plans for surgical repair noted. Pt need further preoperative assessment with cardiac cath to evaluate coronary anatomy and rule out obstructive CAD. I have reviewed the risks, indications, and alternatives to cardiac catheterization, possible angioplasty, and stenting with the patient. Risks include but are not limited to bleeding, infection, vascular injury, stroke, myocardial infection, arrhythmia, kidney injury, radiation-related injury in the case of prolonged fluoroscopy use, emergency cardiac surgery, and death. The patient understands the risks of serious complication is 1-2 in 5277 with diagnostic cardiac cath and 1-2% or less with angioplasty/stenting.     Risk Assessment/Risk Scores:           Severity of Illness: The appropriate patient status for this patient is OBSERVATION. Observation status is judged  to be reasonable and necessary in order to provide the required intensity of service to ensure the patient's safety. The patient's presenting symptoms, physical exam findings, and initial radiographic and laboratory data in the context of their medical condition is felt to place them at decreased risk for further clinical deterioration. Furthermore, it is anticipated that the patient will be medically stable for discharge from the hospital within 2 midnights of admission.    For questions or updates, please contact [place] Please consult [email or web address] for contact info under     Signed, Sherren Mocha, MD  07/20/2022 6:38 AM

## 2022-07-20 NOTE — Discharge Instructions (Signed)

## 2022-07-22 ENCOUNTER — Telehealth: Payer: Self-pay | Admitting: Cardiovascular Disease

## 2022-07-22 NOTE — Telephone Encounter (Signed)
Received FMLA forms from [place]. I called and left VM for patient that we have received forms and he needs to come and fill out release of information and pay $29 fee in the form of [place], [place], or [place] before processing. Putting documents in bin with other FMLA documents.

## 2022-08-03 ENCOUNTER — Other Ambulatory Visit: Payer: Self-pay | Admitting: Orthopedic Surgery

## 2022-08-03 DIAGNOSIS — M25511 Pain in right shoulder: Secondary | ICD-10-CM

## 2022-08-05 NOTE — Telephone Encounter (Signed)
Called again 08/05/22 @ 1:59pm to remind patient that we have his forms and he has to come fill our the ROI and pay $29 fee to process the forms and they will not be started until then.

## 2022-08-11 ENCOUNTER — Other Ambulatory Visit: Payer: Self-pay

## 2022-08-12 ENCOUNTER — Ambulatory Visit
Admission: RE | Admit: 2022-08-12 | Discharge: 2022-08-12 | Disposition: A | Discharge status: Home / Self Care | Payer: 59 | Source: Ambulatory Visit | Attending: Orthopedic Surgery | Admitting: Orthopedic Surgery

## 2022-08-12 DIAGNOSIS — M25511 Pain in right shoulder: Secondary | ICD-10-CM | POA: Insufficient documentation

## 2022-08-13 NOTE — Telephone Encounter (Signed)
Spoke with Karyl Kinnier) from [facility] and she told me that Mr Mcelrath no longer needed the FMLA forms filled out for him. He stated he is part time and does not qualify for FMLA and asked that she reach out to [place] to let us know. Forms have been shredded and staff is aware.

## 2022-08-27 ENCOUNTER — Telehealth: Payer: Self-pay

## 2022-08-27 NOTE — Telephone Encounter (Signed)
Attending Physician's Statement completed and faxed to the [facility] @ [id number] Beginning LOA 09/07/22 through 12/07/22./ DOS 09/07/22.

## 2022-08-28 ENCOUNTER — Ambulatory Visit
Admission: EM | Admit: 2022-08-28 | Discharge: 2022-08-28 | Disposition: A | Discharge status: Home / Self Care | Payer: 59 | Attending: Urgent Care | Admitting: Urgent Care

## 2022-08-28 ENCOUNTER — Other Ambulatory Visit: Payer: Self-pay

## 2022-08-28 DIAGNOSIS — Z23 Encounter for immunization: Secondary | ICD-10-CM | POA: Diagnosis not present

## 2022-08-28 DIAGNOSIS — S61412A Laceration without foreign body of left hand, initial encounter: Secondary | ICD-10-CM

## 2022-08-28 MED ORDER — SULFAMETHOXAZOLE-TRIMETHOPRIM 800-160 MG PO TABS
1.0000 | ORAL_TABLET | Freq: Two times a day (BID) | ORAL | 0 refills | Status: AC
  Filled 2022-08-28: qty 10, 5d supply, fill #0

## 2022-08-28 MED ORDER — TETANUS-DIPHTH-ACELL PERTUSSIS 5-2.5-18.5 LF-MCG/0.5 IM SUSY
0.5000 mL | PREFILLED_SYRINGE | Freq: Once | INTRAMUSCULAR | Status: AC
  Administered 2022-08-28: 0.5 mL via INTRAMUSCULAR

## 2022-08-28 NOTE — ED Provider Notes (Signed)
[facility]    CSN: [id number] Arrival date & time: 08/28/22  1436      History   Chief Complaint Chief Complaint  Patient presents with   Laceration    HPI Austin Houston is a 62 y.o. male.    Laceration   Presents with laceration to L hand. Cut with box cutter.  Patient has invasive cardiac procedure planned for next week.  Concern for infection.  Past Medical History:  Diagnosis Date   Actinic keratosis    Allergic rhinitis due to pollen    Asthma    vs COPD- taking inhalers- as related to allergies only.   Basal cell carcinoma 03/26/2020   R lat deltoid - ED&C    Dysplastic nevus 03/07/2020   L costal infrapectoral - moderate   Dysplastic nevus 03/07/2020   R post flank above waistline - moderate   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HCC)    s/p partial thyroidectomy (follicular adenoma)-surgery only   GERD (gastroesophageal reflux disease)    Hearing impaired person, bilateral    hearing aida bilateral"high frequency loss   Hyperlipidemia    Hypertension    Prostate cancer ([place]) 01/08/2016   PTSD (post-traumatic stress disorder)    s/p multiple active deployments for [employer], no medications taken currently    Patient Active Problem List   Diagnosis Date Noted   Aneurysm of ascending aorta without rupture ([place]) 07/20/2022   Mild persistent asthma without complication [id number]   Prostate cancer ([place]) 01/08/2016   Family history of prostate cancer in father 06/19/2014   Varicose veins of lower extremities with other complications [id number]   Former smokeless tobacco use [id number]   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HCC)    GERD (gastroesophageal reflux disease)    Allergic rhinitis due to pollen    Hypertension    Hyperlipidemia    PTSD (post-traumatic stress disorder)     Past Surgical History:  Procedure Laterality Date   COLONOSCOPY W/ POLYPECTOMY     age 62 "adenoma"   INGUINAL HERNIA REPAIR     right   LEFT HEART CATH AND CORONARY  ANGIOGRAPHY N/A 07/20/2022   Procedure: LEFT HEART CATH AND CORONARY ANGIOGRAPHY;  Surgeon: Sherren Mocha, MD;  Location: [facility];  Service: Cardiovascular;  Laterality: N/A;   ROBOT ASSISTED LAPAROSCOPIC RADICAL PROSTATECTOMY N/A 07/23/2016   Procedure: XI ROBOTIC ASSISTED LAPAROSCOPIC RADICAL PROSTATECTOMY LEVEL 1;  Surgeon: Raynelle Bring, MD;  Location: [facility];  Service: Urology;  Laterality: N/A;   THYROIDECTOMY, PARTIAL     [name])       Home Medications    Prior to Admission medications   Medication Sig Start Date End Date Taking? Authorizing Provider  acetaminophen (TYLENOL) 325 MG tablet Take 2 tablets (650 mg total) by mouth every 6 (six) hours as needed. 06/16/22   Jeanell Sparrow, DO  albuterol (VENTOLIN HFA) 108 (90 Base) MCG/ACT inhaler INHALE 2 PUFFS INTO THE LUNGS EVERY 6 (SIX) HOURS AS NEEDED FOR WHEEZING. 05/20/22 05/20/23  Copland, Frederico Hamman, MD  ALPRAZolam Duanne Moron) 0.25 MG tablet Take 1 tablet (0.25 mg total) by mouth 2 (two) times daily as needed for anxiety or sleep. 05/27/22   [name], MD  amLODipine (NORVASC) 10 MG tablet TAKE 1 TABLET (10 MG TOTAL) BY MOUTH DAILY. 04/19/22 04/19/23  Owens Loffler, MD  aspirin EC 81 MG tablet Take 81 mg by mouth daily. Swallow whole.    [provider]  budesonide-formoterol (SYMBICORT) 160-4.5 MCG/ACT inhaler Inhale 2  puffs into the lungs in the morning and at bedtime. 08/25/21   Mannam, Hart Robinsons, MD  Carboxymethylcellul-Glycerin (LUBRICATING EYE DROPS OP) Apply 1 drop to eye daily as needed (dry eyes).    [provider]  cetirizine (ZYRTEC) 10 MG tablet Take 10 mg by mouth daily.    [provider]  cyclobenzaprine (FLEXERIL) 10 MG tablet Take 0.5-1 tablets (5-10 mg total) by mouth 2 (two) times daily as needed for muscle spasms. 06/16/22   Jeanell Sparrow, DO  diclofenac Sodium (VOLTAREN) 1 % GEL Apply 1 a small amount to affected area twice a day Patient taking differently: Apply 2 g  topically daily as needed (pain). 01/01/22     fluorouracil (EFUDEX) 5 % cream Apply topically 2 (two) times daily. Bid to temples, cheeks for 7 days Patient not taking: Reported on 07/17/2022 03/18/22   Ralene Bathe, MD  fluticasone [facility]) 50 MCG/ACT nasal spray Place 2 sprays into both nostrils daily. 10/30/20   Evelina Dun A, FNP  losartan (COZAAR) 100 MG tablet TAKE 1 TABLET BY MOUTH DAILY. 04/19/22 04/19/23  Copland, Frederico Hamman, MD  montelukast (SINGULAIR) 10 MG tablet Take 1 tablet (10 mg total) by mouth at bedtime. 05/20/22   Marshell Garfinkel, MD  Multiple Vitamin (MULTIVITAMIN WITH MINERALS) TABS tablet Take 1 tablet by mouth daily.    [provider]  nystatin (MYCOSTATIN) 100000 UNIT/ML suspension Take 5 mLs (500,000 Units total) by mouth 4 (four) times daily. Swish, gargle and spit. Patient taking differently: Take 5 mLs by mouth 4 (four) times daily as needed (Swish, gargle and spit.). 11/25/21   Brunetta Jeans, PA-C  omeprazole (PRILOSEC) 40 MG capsule Take 1 capsule (40 mg total) by mouth daily. 04/19/22   Copland, Frederico Hamman, MD  ondansetron (ZOFRAN) 4 MG tablet Take 1 tablet (4 mg total) by mouth every 8 (eight) hours as needed for nausea or vomiting. 04/22/22   Copland, Frederico Hamman, MD  promethazine (PHENERGAN) 25 MG suppository Place 1 suppository (25 mg total) rectally every 6 (six) hours as needed for nausea or vomiting. 12/22/21   Copland, Frederico Hamman, MD  tadalafil (CIALIS) 5 MG tablet Take 5 mg by mouth daily.    [provider]  beclomethasone (QVAR) 80 MCG/ACT inhaler Inhale 2 puffs into the lungs daily. 04/27/18 12/19/19  Juanito Doom, MD    Family History Family History  Problem Relation Age of Onset   Heart disease Paternal Grandmother    Prostate cancer Father        Recurrent x 3   Hyperlipidemia Father    Other Father        varicose veins   Hyperlipidemia Mother    Other Mother        varicose veins   Hypertension Maternal Grandmother     Hypertension Maternal Grandfather    Sudden death Brother 33   Colon cancer Neg Hx    Esophageal cancer Neg Hx    Rectal cancer Neg Hx    Stomach cancer Neg Hx     Social History Social History   Tobacco Use   Smoking status: Never   Smokeless tobacco: Former    Types: Snuff    Quit date: 12/03/2012  Vaping Use   Vaping Use: Never used  Substance Use Topics   Alcohol use: Yes    Alcohol/week: 10.0 standard drinks of alcohol    Types: 10 Cans of beer per week    Comment: moderate   Drug use: No     Allergies  Vioxx [rofecoxib], Celebrex [celecoxib], Codeine, and Doxycycline   Review of Systems Review of Systems   Physical Exam Triage Vital Signs ED Triage Vitals  Enc Vitals Group     BP      Pulse      Resp      Temp      Temp src      SpO2      Weight      Height      Head Circumference      Peak Flow      Pain Score      Pain Loc      Pain Edu?      Excl. in [place]?    No data found.  Updated Vital Signs There were no vitals taken for this visit.  Visual Acuity Right Eye Distance:   Left Eye Distance:   Bilateral Distance:    Right Eye Near:   Left Eye Near:    Bilateral Near:     Physical Exam Musculoskeletal:       Hands:      UC Treatments / Results  Labs (all labs ordered are listed, but only abnormal results are displayed) Labs Reviewed - No data to display  EKG   Radiology No results found.  Procedures Laceration Repair  Date/Time: 08/28/2022 3:25 PM  Performed by: Rose Phi, FNP Authorized by: Rose Phi, FNP   Consent:    Consent obtained:  Verbal   Consent given by:  Patient   Risks, benefits, and alternatives were discussed: yes     Risks discussed:  Infection and pain Universal protocol:    Procedure explained and questions answered to patient or proxy's satisfaction: yes     Relevant documents present and verified: yes     Test results available: no     Imaging studies available: no      Required blood products, implants, devices, and special equipment available: no     Site/side marked: no     Immediately prior to procedure, a time out was called: yes     Patient identity confirmed:  Verbally with patient Anesthesia:    Anesthesia method:  Local infiltration   Local anesthetic:  Lidocaine 1% WITH epi Laceration details:    Location:  Hand   Hand location:  L hand, dorsum   Length (cm):  4   Depth (mm):  2 Pre-procedure details:    Preparation:  Patient was prepped and draped in usual sterile fashion Exploration:    Limited defect created (wound extended): no     Hemostasis achieved with:  Direct pressure   Imaging outcome: foreign body not noted     Wound exploration: wound explored through full range of motion     Wound extent: areolar tissue violated     Contaminated: no   Treatment:    Area cleansed with:  Saline   Amount of cleaning:  Standard   Irrigation volume:  20 ml   Irrigation method:  Syringe   Visualized foreign bodies/material removed: no     Debridement:  None   Undermining:  None   Scar revision: no   Skin repair:    Repair method:  Sutures   Suture size:  4-0   Suture material:  Prolene   Suture technique:  Simple interrupted   Number of sutures:  15 (approximate) Approximation:    Approximation:  Close Repair type:    Repair type:  Simple Post-procedure details:    Dressing:  Antibiotic  ointment and non-adherent dressing   Procedure completion:  Tolerated Comments:     Well tolerated  (including critical care time)  Medications Ordered in UC Medications  Tdap (BOOSTRIX) injection 0.5 mL (has no administration in time range)    Initial Impression / Assessment and Plan / UC Course  I have reviewed the triage vital signs and the nursing notes.  Pertinent labs & imaging results that were available during my care of the patient were reviewed by me and considered in my medical decision making (see chart for details).   See  procedure note. Bactrim ordered as prophylactic antibiotic given patient scheduled cardiac procedures.   Final Clinical Impressions(s) / UC Diagnoses   Final diagnoses:  Laceration of left hand without foreign body, initial encounter   Discharge Instructions   None    ED Prescriptions   None    PDMP not reviewed this encounter.   Rose Phi, [place] 08/28/22 1527

## 2022-08-28 NOTE — ED Triage Notes (Signed)
Patient presents to UC for laceration to left palm. He states he cut with box cutter about 1 hr ago. Up to date on Tdap.

## 2022-08-28 NOTE — Discharge Instructions (Addendum)
Sutures out in 7 days. Watch for signs/symptoms of infection.

## 2022-09-02 NOTE — Progress Notes (Signed)
Surgical Instructions    Your procedure is scheduled on Monday, 09/07/22.  Report to [facility] Main Entrance "A" at 5:30 A.M., then check in with the Admitting office.  Call this number if you have problems the morning of surgery:  [phone]   If you have any questions prior to your surgery date call [phone]: Open Monday-Friday 8am-4pm If you experience any cold or flu symptoms such as cough, fever, chills, shortness of breath, etc. between now and your scheduled surgery, please notify us at the above number     Remember:  Do not eat or drink after midnight the night before your surgery     Take these medicines the morning of surgery with A SIP OF WATER:  amLODipine (NORVASC)  budesonide-formoterol (SYMBICORT)  cetirizine (ZYRTEC)  fluticasone (FLONASE)  omeprazole (PRILOSEC)  sulfamethoxazole-trimethoprim (BACTRIM DS)   IF NEEDED: acetaminophen (TYLENOL)  albuterol (VENTOLIN HFA) inhaler- bring with you the day of surgery ALPRAZolam Duanne Moron)  Carboxymethylcellul-Glycerin (LUBRICATING EYE DROPS OP)  cyclobenzaprine (FLEXERIL)  nystatin (MYCOSTATIN)  ondansetron (ZOFRAN)  promethazine (PHENERGAN)   As of today, STOP taking any Aspirin (unless otherwise instructed by your surgeon) Aleve, Naproxen, Ibuprofen, Motrin, Advil, Goody's, BC's, all herbal medications, diclofenac Sodium (VOLTAREN) gel, fish oil, and all vitamins.           Do not wear jewelry or makeup. Do not wear lotions, powders, cologne or deodorant. Men may shave face and neck. Do not bring valuables to the hospital. Do not wear nail polish, gel polish, artificial nails, or any other type of covering on natural nails (fingers and toes) If you have artificial nails or gel coating that need to be removed by a nail salon, please have this removed prior to surgery. Artificial nails or gel coating may interfere with anesthesia's ability to adequately monitor your vital signs.  [facility] is not responsible  for any belongings or valuables.    Do NOT Smoke (Tobacco/Vaping)  24 hours prior to your procedure  If you use a CPAP at night, you may bring your mask for your overnight stay.   Contacts, glasses, hearing aids, dentures or partials may not be worn into surgery, please bring cases for these belongings   For patients admitted to the hospital, discharge time will be determined by your treatment team.   Patients discharged the day of surgery will not be allowed to drive home, and someone needs to stay with them for 24 hours.   SURGICAL WAITING ROOM VISITATION Patients having surgery or a procedure may have no more than 2 support people in the waiting area - these visitors may rotate.   Children under the age of 35 must have an adult with them who is not the patient. If the patient needs to stay at the hospital during part of their recovery, the visitor guidelines for inpatient rooms apply. Pre-op nurse will coordinate an appropriate time for 1 support person to accompany patient in pre-op.  This support person may not rotate.   Please refer to [email or web address] for the visitor guidelines for Inpatients (after your surgery is over and you are in a regular room).    Special instructions:    Oral Hygiene is also important to reduce your risk of infection.  Remember - BRUSH YOUR TEETH THE MORNING OF SURGERY WITH YOUR REGULAR TOOTHPASTE   [facility]- Preparing For Surgery  Before surgery, you can play an important role. Because skin is not sterile, your skin needs to be as free of germs as  possible. You can reduce the number of germs on your skin by washing with CHG (chlorahexidine gluconate) Soap before surgery.  CHG is an antiseptic cleaner which kills germs and bonds with the skin to continue killing germs even after washing.     Please do not use if you have an allergy to CHG or antibacterial soaps. If your skin becomes  reddened/irritated stop using the CHG.  Do not shave (including legs and underarms) for at least 48 hours prior to first CHG shower. It is OK to shave your face.  Please follow these instructions carefully.     Shower the NIGHT BEFORE SURGERY and the MORNING OF SURGERY with CHG Soap.   If you chose to wash your hair, wash your hair first as usual with your normal shampoo. After you shampoo, rinse your hair and body thoroughly to remove the shampoo.  Then [employer] and genitals (private parts) with your normal soap and rinse thoroughly to remove soap.  After that Use CHG Soap as you would any other liquid soap. You can apply CHG directly to the skin and wash gently with a scrungie or a clean washcloth.   Apply the CHG Soap to your body ONLY FROM THE NECK DOWN.  Do not use on open wounds or open sores. Avoid contact with your eyes, ears, mouth and genitals (private parts). Wash Face and genitals (private parts)  with your normal soap.   Wash thoroughly, paying special attention to the area where your surgery will be performed.  Thoroughly rinse your body with warm water from the neck down.  DO NOT shower/wash with your normal soap after using and rinsing off the CHG Soap.  Pat yourself dry with a CLEAN TOWEL.  Wear CLEAN PAJAMAS to bed the night before surgery  Place CLEAN SHEETS on your bed the night before your surgery  DO NOT SLEEP WITH PETS.   Day of Surgery: Take a shower with CHG soap. Wear Clean/Comfortable clothing the morning of surgery Do not apply any deodorants/lotions.   Remember to brush your teeth WITH YOUR REGULAR TOOTHPASTE.    If you received a COVID test during your pre-op visit, it is requested that you wear a mask when out in public, stay away from anyone that may not be feeling well, and notify your surgeon if you develop symptoms. If you have been in contact with anyone that has tested positive in the last 10 days, please notify your surgeon.    Please read  over the following fact sheets that you were given.

## 2022-09-03 ENCOUNTER — Other Ambulatory Visit (HOSPITAL_COMMUNITY): Payer: Self-pay

## 2022-09-03 ENCOUNTER — Encounter (HOSPITAL_COMMUNITY)
Admission: RE | Admit: 2022-09-03 | Discharge: 2022-09-03 | Disposition: A | Discharge status: Home / Self Care | Payer: 59 | Source: Ambulatory Visit | Attending: Surgery | Admitting: Surgery

## 2022-09-03 ENCOUNTER — Ambulatory Visit (HOSPITAL_COMMUNITY)
Admission: RE | Admit: 2022-09-03 | Discharge: 2022-09-03 | Disposition: A | Discharge status: Home / Self Care | Payer: 59 | Source: Ambulatory Visit | Attending: Surgery | Admitting: Surgery

## 2022-09-03 ENCOUNTER — Encounter (HOSPITAL_COMMUNITY): Payer: Self-pay

## 2022-09-03 ENCOUNTER — Other Ambulatory Visit: Payer: Self-pay

## 2022-09-03 VITALS — BP 130/90 | HR 94 | Temp 97.6°F | Resp 17 | Ht 73.0 in | Wt 211.5 lb

## 2022-09-03 DIAGNOSIS — Z1152 Encounter for screening for COVID-19: Secondary | ICD-10-CM | POA: Diagnosis not present

## 2022-09-03 DIAGNOSIS — Z01818 Encounter for other preprocedural examination: Secondary | ICD-10-CM | POA: Diagnosis not present

## 2022-09-03 DIAGNOSIS — I7121 Aneurysm of the ascending aorta, without rupture: Secondary | ICD-10-CM | POA: Diagnosis not present

## 2022-09-03 LAB — SURGICAL PCR SCREEN
MRSA, PCR: NEGATIVE
Staphylococcus aureus: NEGATIVE

## 2022-09-03 LAB — CBC
HCT: 42.9 % (ref 39.0–52.0)
Hemoglobin: 14.8 g/dL (ref 13.0–17.0)
MCH: 33.6 pg (ref 26.0–34.0)
MCHC: 34.5 g/dL (ref 30.0–36.0)
MCV: 97.3 fL (ref 80.0–100.0)
Platelets: 221 10*3/uL (ref 150–400)
RBC: 4.41 MIL/uL (ref 4.22–5.81)
RDW: 11.8 % (ref 11.5–15.5)
WBC: 3.1 10*3/uL — ABNORMAL LOW (ref 4.0–10.5)
nRBC: 0 % (ref 0.0–0.2)

## 2022-09-03 LAB — TYPE AND SCREEN
ABO/RH(D): O POS
Antibody Screen: NEGATIVE

## 2022-09-03 LAB — SARS CORONAVIRUS 2 (TAT 6-24 HRS): SARS Coronavirus 2: NEGATIVE

## 2022-09-03 LAB — BLOOD GAS, ARTERIAL
Acid-Base Excess: 1.7 mmol/L (ref 0.0–2.0)
Bicarbonate: 24.4 mmol/L (ref 20.0–28.0)
Drawn by: 587931
O2 Saturation: 100 %
Patient temperature: 37
pCO2 arterial: 32 mmHg (ref 32–48)
pH, Arterial: 7.49 — ABNORMAL HIGH (ref 7.35–7.45)
pO2, Arterial: 107 mmHg (ref 83–108)

## 2022-09-03 LAB — COMPREHENSIVE METABOLIC PANEL
ALT: 138 U/L — ABNORMAL HIGH (ref 0–44)
AST: 242 U/L — ABNORMAL HIGH (ref 15–41)
Albumin: 4.3 g/dL (ref 3.5–5.0)
Alkaline Phosphatase: 71 U/L (ref 38–126)
Anion gap: 10 (ref 5–15)
BUN: 9 mg/dL (ref 6–20)
CO2: 21 mmol/L — ABNORMAL LOW (ref 22–32)
Calcium: 9.4 mg/dL (ref 8.9–10.3)
Chloride: 104 mmol/L (ref 98–111)
Creatinine, Ser: 0.96 mg/dL (ref 0.61–1.24)
GFR, Estimated: >60 mL/min (ref >60)
Glucose, Bld: 97 mg/dL (ref 70–99)
Potassium: 4.3 mmol/L (ref 3.5–5.1)
Sodium: 135 mmol/L (ref 135–145)
Total Bilirubin: 0.9 mg/dL (ref 0.3–1.2)
Total Protein: 7.5 g/dL (ref 6.5–8.1)

## 2022-09-03 LAB — HEMOGLOBIN A1C
Hgb A1c MFr Bld: 5 % (ref 4.8–5.6)
Mean Plasma Glucose: 96.8 mg/dL

## 2022-09-03 LAB — URINALYSIS, ROUTINE W REFLEX MICROSCOPIC
Bilirubin Urine: NEGATIVE
Glucose, UA: NEGATIVE mg/dL
Hgb urine dipstick: NEGATIVE
Ketones, ur: NEGATIVE mg/dL
Leukocytes,Ua: NEGATIVE
Nitrite: NEGATIVE
Protein, ur: NEGATIVE mg/dL
Specific Gravity, Urine: 1.012 (ref 1.005–1.030)
pH: 7 (ref 5.0–8.0)

## 2022-09-03 LAB — APTT: aPTT: 25 seconds (ref 24–36)

## 2022-09-03 LAB — PROTIME-INR
INR: 1 (ref 0.8–1.2)
Prothrombin Time: 13.1 seconds (ref 11.4–15.2)

## 2022-09-03 NOTE — Progress Notes (Signed)
PCP - Dr. Donella Stade Cardiologist - N/A  PPM/ICD - Denies  Chest x-ray - 09/03/22 EKG - 09/03/22 Stress Test - Denies ECHO - 06/16/22 Cardiac Cath - 07/20/22  Sleep Study - Denies  Diabetes: Denies  Blood Thinner Instructions: N/A Aspirin Instructions: Continue throughout the day before surgery. Do not take DOS.  ERAS Protcol - No   COVID TEST- 09/03/22 in PAT    Anesthesia review: Yes, cardiac hx. Laceration sutured on the left hand. Clean, dry, and intact. Jeneen Rinks PA took at look at it during PAT appt and said it looked pretty good. Patient taking Bactrim prophylactically. Patient instructed to call Dr. Vivi Martens office if anything changes with wound.   Patient denies shortness of breath, fever, cough and chest pain at PAT appointment   All instructions explained to the patient, with a verbal understanding of the material. Patient agrees to go over the instructions while at home for a better understanding. Patient also instructed to self quarantine after being tested for COVID-19. The opportunity to ask questions was provided.

## 2022-09-04 ENCOUNTER — Telehealth: Payer: Self-pay | Admitting: Cardiovascular Disease

## 2022-09-04 ENCOUNTER — Other Ambulatory Visit: Payer: Self-pay

## 2022-09-04 DIAGNOSIS — I2510 Atherosclerotic heart disease of native coronary artery without angina pectoris: Secondary | ICD-10-CM

## 2022-09-04 MED ORDER — PHENYLEPHRINE HCL-NACL 20-0.9 MG/250ML-% IV SOLN
30.0000 ug/min | INTRAVENOUS | Status: AC
  Administered 2022-09-07: 25 ug/min via INTRAVENOUS
  Filled 2022-09-04: qty 250

## 2022-09-04 MED ORDER — DEXMEDETOMIDINE HCL IN NACL 400 MCG/100ML IV SOLN
0.1000 ug/kg/h | INTRAVENOUS | Status: AC
  Administered 2022-09-07: .7 ug/kg/h via INTRAVENOUS
  Filled 2022-09-04: qty 100

## 2022-09-04 MED ORDER — CEFAZOLIN SODIUM-DEXTROSE 2-4 GM/100ML-% IV SOLN
2.0000 g | INTRAVENOUS | Status: DC
  Filled 2022-09-04: qty 100

## 2022-09-04 MED ORDER — TRANEXAMIC ACID 1000 MG/10ML IV SOLN
1.5000 mg/kg/h | INTRAVENOUS | Status: AC
  Administered 2022-09-07: 1.5 mg/kg/h via INTRAVENOUS
  Filled 2022-09-04: qty 25

## 2022-09-04 MED ORDER — TRANEXAMIC ACID (OHS) BOLUS VIA INFUSION
15.0000 mg/kg | INTRAVENOUS | Status: AC
  Administered 2022-09-07: 1438.5 mg via INTRAVENOUS
  Filled 2022-09-04: qty 1439

## 2022-09-04 MED ORDER — CEFAZOLIN SODIUM-DEXTROSE 2-4 GM/100ML-% IV SOLN
2.0000 g | INTRAVENOUS | Status: AC
  Administered 2022-09-07 (×2): 2 g via INTRAVENOUS
  Filled 2022-09-04: qty 100

## 2022-09-04 MED ORDER — PLASMA-LYTE A IV SOLN
INTRAVENOUS | Status: DC
  Filled 2022-09-04: qty 2.5

## 2022-09-04 MED ORDER — MILRINONE LACTATE IN DEXTROSE 20-5 MG/100ML-% IV SOLN
0.3000 ug/kg/min | INTRAVENOUS | Status: DC
  Filled 2022-09-04: qty 100

## 2022-09-04 MED ORDER — NITROGLYCERIN IN D5W 200-5 MCG/ML-% IV SOLN
2.0000 ug/min | INTRAVENOUS | Status: DC
  Filled 2022-09-04: qty 250

## 2022-09-04 MED ORDER — ATORVASTATIN CALCIUM 20 MG PO TABS
20.0000 mg | ORAL_TABLET | Freq: Every day | ORAL | 3 refills | Status: DC
  Filled 2022-09-04: qty 90, 90d supply, fill #0

## 2022-09-04 MED ORDER — EPINEPHRINE HCL 5 MG/250ML IV SOLN IN NS
0.0000 ug/min | INTRAVENOUS | Status: DC
  Filled 2022-09-04: qty 250

## 2022-09-04 MED ORDER — MANNITOL 20 % IV SOLN
INTRAVENOUS | Status: DC
  Filled 2022-09-04: qty 13

## 2022-09-04 MED ORDER — POTASSIUM CHLORIDE 2 MEQ/ML IV SOLN
80.0000 meq | INTRAVENOUS | Status: DC
  Filled 2022-09-04: qty 40

## 2022-09-04 MED ORDER — HEPARIN 30,000 UNITS/1000 ML (OHS) CELLSAVER SOLUTION
Status: DC
  Filled 2022-09-04: qty 1000

## 2022-09-04 MED ORDER — NOREPINEPHRINE 4 MG/250ML-% IV SOLN
0.0000 ug/min | INTRAVENOUS | Status: DC
  Filled 2022-09-04: qty 250

## 2022-09-04 MED ORDER — VANCOMYCIN HCL 1500 MG/300ML IV SOLN
1500.0000 mg | INTRAVENOUS | Status: AC
  Administered 2022-09-07: 1500 mg via INTRAVENOUS
  Filled 2022-09-04: qty 300

## 2022-09-04 MED ORDER — TRANEXAMIC ACID (OHS) PUMP PRIME SOLUTION
2.0000 mg/kg | INTRAVENOUS | Status: DC
  Filled 2022-09-04: qty 1.92

## 2022-09-04 MED ORDER — INSULIN REGULAR(HUMAN) IN NACL 100-0.9 UT/100ML-% IV SOLN
INTRAVENOUS | Status: AC
  Administered 2022-09-07: .8 [IU]/h via INTRAVENOUS
  Filled 2022-09-04: qty 100

## 2022-09-04 NOTE — Telephone Encounter (Signed)
Per Karoline Caldwell, PA-C: Dr. Burt Knack, I work in the preop clinic at [facility] and have been reviewing Austin Houston's chart for his upcoming surgery with Dr. Cyndia Bent on Monday. I saw the message from today regarding him starting on a statin. I wanted to make sure you saw his LFTs from yesterday, AST 242 and ALT 138. I talked with Austin Houston at length about the labs this morning. He readily admits to increased etoh intake due to multiple life stressors at the moment. He is working to cut back. I advised him to hold off on starting the statin until he follows up with his PCP Dr. Frederico Hamman Copland after surgery and has a repeat hepatic function panel. I also discussed his labs with Dr. Suann Larry and he's going to make. Dr. Cyndia Bent aware.   Per Dr Burt Knack, cancel order for Atorvastatin.   Called and spoke directly to pharmacist at [facility] who will cancel the rx. Labs and medication cancelled on our end as well.

## 2022-09-04 NOTE — H&P (Signed)
[address]       [place] [address]             [phone]      Cardiothoracic Surgery Admission History and Physical   PCP is Copland, Frederico Hamman, MD Referring Provider is Owens Loffler, MD       Chief Complaint  Patient presents with   Thoracic Aortic Aneurysm      Surgical consult, Chest CT 04/28/22       HPI:   The patient is a 62 year old gentleman who works in our cardiac Cath Lab with a history of hypertension, hyperlipidemia, prostate cancer followed by Dr. 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  He had some dilation of the ascending aorta dating back to 2004 on chest CT when it was measured at 3.8 x 4.4 cm.  The descending aorta at that time is 3 cm.  That scan also showed a nodule in the left lobe of the thyroid that was subsequently removed and found to be follicular thyroid cancer.  He had a follow-up CT scan of the chest in 2005 which showed the ascending aorta to measure 3.8 x 4.3 cm.  He did not have another scan until June 2018 when a high-resolution CT scan of the chest was done to evaluate some shortness of breath.  This showed the ascending aorta to have a diameter of 4.7 cm.  He recently had a CT of the chest with contrast on 04/28/2022 which showed the ascending aortic aneurysm has enlarged further to 5.0 cm.  The descending aorta is about 2.9 cm.  He has a family history of his brother dying in 2002 from an ascending aortic dissection.  There is no known family history of aortic valve disease or connective tissue disorder although he mentions that when his brother died there was some question about whether he had Marfan syndrome although it has never been documented in him or any other family member.  No one in the family has any of the stigmata of Marfan syndrome.   He is married and lives with his wife.  He denies any chest pain or pressure.  He has had no back pain.  He  denies any shortness of breath.     Past Medical History:  Diagnosis Date   Actinic keratosis     Allergic rhinitis due to pollen     Asthma      vs COPD- taking inhalers- as related to allergies only.   Basal cell carcinoma 03/26/2020    R lat deltoid - ED&C    Dysplastic nevus 03/07/2020    L costal infrapectoral - moderate   Dysplastic nevus 03/07/2020    R post flank above waistline - moderate   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HCC)      s/p partial thyroidectomy (follicular adenoma)-surgery only   GERD (gastroesophageal reflux disease)     Hearing impaired person, bilateral      hearing aida bilateral"high frequency loss   Hyperlipidemia     Hypertension     Prostate cancer ([place]) 01/08/2016   PTSD (post-traumatic stress disorder)      s/p multiple active deployments for [employer], no medications taken currently           Past Surgical History:  Procedure Laterality Date   COLONOSCOPY W/ POLYPECTOMY        age 45 "adenoma"   INGUINAL HERNIA REPAIR  right   ROBOT ASSISTED LAPAROSCOPIC RADICAL PROSTATECTOMY N/A 07/23/2016    Procedure: XI ROBOTIC ASSISTED LAPAROSCOPIC RADICAL PROSTATECTOMY LEVEL 1;  Surgeon: Raynelle Bring, MD;  Location: [facility];  Service: Urology;  Laterality: N/A;   THYROIDECTOMY, PARTIAL        [name] ([place])           Family History  Problem Relation Age of Onset   Heart disease Paternal Grandmother     Prostate cancer Father          Recurrent x 3   Hyperlipidemia Father     Other Father          varicose veins   Hyperlipidemia Mother     Other Mother          varicose veins   Hypertension Maternal Grandmother     Hypertension Maternal Grandfather     Sudden death Brother 75   Colon cancer Neg Hx     Esophageal cancer Neg Hx     Rectal cancer Neg Hx     Stomach cancer Neg Hx        Social History Social History         Tobacco Use   Smoking status: Never   Smokeless tobacco: Former      Types: Snuff      Quit date: 12/03/2012   Vaping Use   Vaping Use: Never used  Substance Use Topics   Alcohol use: Yes      Alcohol/week: 10.0 standard drinks of alcohol      Types: 10 Cans of beer per week      Comment: moderate   Drug use: No            Current Outpatient Medications  Medication Sig Dispense Refill   albuterol (VENTOLIN HFA) 108 (90 Base) MCG/ACT inhaler INHALE 2 PUFFS INTO THE LUNGS EVERY 6 (SIX) HOURS AS NEEDED FOR WHEEZING. 20.1 g 3   ALPRAZolam (XANAX) 0.25 MG tablet Take 1 tablet (0.25 mg total) by mouth 2 (two) times daily as needed for anxiety or sleep. 60 tablet 3   amLODipine (NORVASC) 10 MG tablet TAKE 1 TABLET (10 MG TOTAL) BY MOUTH DAILY. 90 tablet 3   budesonide-formoterol (SYMBICORT) 160-4.5 MCG/ACT inhaler Inhale 2 puffs into the lungs in the morning and at bedtime. 10.2 g 5   Carboxymethylcellul-Glycerin (LUBRICATING EYE DROPS OP) Apply 1 drop to eye daily as needed (dry eyes).       cetirizine (ZYRTEC) 10 MG tablet Take 10 mg by mouth daily.       cyclobenzaprine (FLEXERIL) 10 MG tablet Take 0.5-1 tablets (5-10 mg total) by mouth 3 (three) times daily as needed for muscle spasms. 20 tablet 0   diclofenac Sodium (VOLTAREN) 1 % GEL Apply 1 a small amount to affected area twice a day 100 g 0   fluorouracil (EFUDEX) 5 % cream Apply topically 2 (two) times daily. Bid to temples, cheeks for 7 days 30 g 1   fluticasone (FLONASE) 50 MCG/ACT nasal spray Place 2 sprays into both nostrils daily. 16 g 6   losartan (COZAAR) 100 MG tablet TAKE 1 TABLET BY MOUTH DAILY. 90 tablet 3   montelukast (SINGULAIR) 10 MG tablet Take 1 tablet (10 mg total) by mouth at bedtime. 30 tablet 5   Multiple Vitamin (MULTIVITAMIN WITH MINERALS) TABS tablet Take 1 tablet by mouth daily.       nystatin (MYCOSTATIN) 100000 UNIT/ML suspension Take 5 mLs (500,000 Units total) by mouth 4 (four)  times daily. Swish, gargle and spit. 120 mL 0   omeprazole (PRILOSEC) 40 MG capsule Take 1 capsule (40 mg total) by mouth daily. 90 capsule  3   ondansetron (ZOFRAN) 4 MG tablet Take 1 tablet (4 mg total) by mouth every 8 (eight) hours as needed for nausea or vomiting. 20 tablet 2   promethazine (PHENERGAN) 25 MG suppository Place 1 suppository (25 mg total) rectally every 6 (six) hours as needed for nausea or vomiting. 12 each 2   tadalafil (CIALIS) 5 MG tablet Take 5 mg by mouth daily.                 Current Facility-Administered Medications  Medication Dose Route Frequency Provider Last Rate Last Admin   0.9 %  sodium chloride infusion  500 mL Intravenous Once Pyrtle, Lajuan Lines, MD               Allergies  Allergen Reactions   Vioxx [Rofecoxib] Anaphylaxis      Tolerates aspirin and naproxen.   Celebrex [Celecoxib] Rash      Tolerates aspirin and naproxen.   Codeine Rash      Patient reports this happened as a child. He has tolerated Codeine since that time   Doxycycline Rash      Review of Systems  Constitutional: Negative.  Negative for fatigue.  HENT:  Positive for hearing loss.        Sees dentist regularly  Eyes: Negative.   Respiratory:  Negative for shortness of breath.   Cardiovascular:  Negative for chest pain and leg swelling.  Gastrointestinal: Negative.   Endocrine: Negative.   Genitourinary: Negative.   Musculoskeletal:  Positive for arthralgias.  Skin: Negative.   Allergic/Immunologic: Negative.   Neurological:  Negative for dizziness and syncope.  Hematological:  Bruises/bleeds easily.  Psychiatric/Behavioral:  The patient is nervous/anxious.       BP (!) 142/80   Pulse 85   Resp 20   Ht '6\' 1"'$  (1.854 m)   Wt 207 lb (93.9 kg)   SpO2 96% Comment: RA  BMI 27.31 kg/m  Physical Exam Constitutional:      Appearance: Normal appearance. He is normal weight.  Eyes:     Extraocular Movements: Extraocular movements intact.     Conjunctiva/sclera: Conjunctivae normal.     Pupils: Pupils are equal, round, and reactive to light.  Cardiovascular:     Rate and Rhythm: Normal rate and regular rhythm.      Heart sounds: Normal heart sounds. No murmur heard. Pulmonary:     Effort: Pulmonary effort is normal.     Breath sounds: Normal breath sounds.  Musculoskeletal:        General: No swelling.  Neurological:     General: No focal deficit present.     Mental Status: He is alert and oriented to person, place, and time.  Psychiatric:        Mood and Affect: Mood normal.        Behavior: Behavior normal.          Diagnostic Tests:   Narrative & Impression  CLINICAL DATA:  Follow-up aneurysm23   EXAM: CT CHEST WITH CONTRAST   TECHNIQUE: Multidetector CT imaging of the chest was performed during intravenous contrast administration.   RADIATION DOSE REDUCTION: This exam was performed according to the departmental dose-optimization program which includes automated exposure control, adjustment of the mA and/or kV according to patient size and/or use of iterative reconstruction technique.   CONTRAST:  12m ISOVUE-300  IOPAMIDOL (ISOVUE-300) INJECTION 61%   COMPARISON:  Chest CT 01/07/2017   FINDINGS: Cardiovascular: Ascending aortic aneurysm with maximum diameter of 5 cm. No dissection. Coronary vascular calcification. Borderline cardiomegaly. No pericardial effusion   Mediastinum/Nodes: Midline trachea. No thyroid mass. No suspicious lymph nodes. Esophagus within normal limits.   Lungs/Pleura: Lungs are clear. No pleural effusion or pneumothorax.   Upper Abdomen: Hepatic steatosis.   Musculoskeletal: No acute osseous abnormality.   IMPRESSION: 1. Ascending aortic aneurysm measuring up to 5 cm. Ascending thoracic aortic aneurysm. Recommend semi-annual imaging followup by CTA or MRA and referral to cardiothoracic surgery if not already obtained. This recommendation follows 2010 ACCF/AHA/AATS/ACR/ASA/SCA/SCAI/SIR/STS/SVM Guidelines for the Diagnosis and Management of Patients With Thoracic Aortic Disease. Circulation. 2010; [id number]. Aortic aneurysm NOS  (ICD10-I71.9) 2. Hepatic steatosis   Aortic Atherosclerosis (ICD10-I70.0).Aortic aneurysm NOS (ICD10-I71.9).     Electronically Signed   By: Donavan Foil M.D.   On: 04/28/2022 23:20         ECHOCARDIOGRAM REPORT       Patient Name:   KASSEN FINE Date of Exam: 06/16/2022  Medical Rec #:  [id number]        Height:       73.0 in  Accession #:    [id number]       Weight:       207.0 lb  Date of Birth:  09/24/1960       BSA:          2.183 m  Patient Age:    89 years         BP:           147/87 mmHg  Patient Gender: M                HR:           91 bpm.  Exam Location:  Outpatient   Procedure: 2D Echo, Color Doppler and Cardiac Doppler   Indications:    CAD Native Vessel    History:        Patient has no prior history of Echocardiogram  examinations.                 Risk Factors:Hypertension and Dyslipidemia. Hx of cancer.    Sonographer:    Eartha Inch  Referring Phys: [id number] Llu[address] Gwynne K Axton Cihlar     Sonographer Comments: Technically difficult study due to poor echo  windows. Image acquisition challenging due to patient body habitus and  Image acquisition challenging due to respiratory motion.  IMPRESSIONS     1. Left ventricular ejection fraction, by estimation, is 70 to 75%. The  left ventricle has hyperdynamic function. Left ventricular endocardial  border not optimally defined to evaluate regional wall motion.  Indeterminate diastolic filling due to E-A  fusion.   2. Right ventricular systolic function is hyperdynamic. The right  ventricular size is normal.   3. The mitral valve was not well visualized. No evidence of mitral valve  regurgitation. No evidence of mitral stenosis.   4. The aortic valve is tricuspid. Aortic valve regurgitation is not  visualized.   5. Aortic dilatation noted. There is moderate dilatation of the ascending  aorta, measuring 48 mm. There is mild dilatation of the aortic arch,  measuring 43 mm.   6. Technically difficult study with  evidence of aortic dilation in the  ascending aorta and arch.   Comparison(s): No prior Echocardiogram.   FINDINGS   Left Ventricle: Left ventricular ejection fraction, by estimation,  is 70  to 75%. The left ventricle has hyperdynamic function. Left ventricular  endocardial border not optimally defined to evaluate regional wall motion.  The left ventricular internal  cavity size was normal in size. There is no left ventricular hypertrophy.  Indeterminate diastolic filling due to E-A fusion.   Right Ventricle: The right ventricular size is normal. No increase in  right ventricular wall thickness. Right ventricular systolic function is  hyperdynamic.   Left Atrium: Left atrial size was normal in size.   Right Atrium: Right atrial size was normal in size.   Pericardium: There is no evidence of pericardial effusion. Presence of  epicardial fat layer.   Mitral Valve: The mitral valve was not well visualized. No evidence of  mitral valve regurgitation. No evidence of mitral valve stenosis.   Tricuspid Valve: The tricuspid valve is not well visualized. Tricuspid  valve regurgitation is not demonstrated. No evidence of tricuspid  stenosis.   Aortic Valve: The aortic valve is tricuspid. Aortic valve regurgitation is  not visualized.   Pulmonic Valve: The pulmonic valve was not well visualized. Pulmonic valve  regurgitation is not visualized. No evidence of pulmonic stenosis.   Aorta: Aortic dilatation noted. There is moderate dilatation of the  ascending aorta, measuring 48 mm. There is mild dilatation of the aortic  arch, measuring 43 mm.   IAS/Shunts: No atrial level shunt detected by color flow Doppler.     LEFT VENTRICLE  PLAX 2D  LVIDd:         4.80 cm   Diastology  LVIDs:         3.20 cm   LV e' medial:    5.87 cm/s  LV PW:         0.90 cm   LV E/e' medial:  10.4  LV IVS:        1.00 cm   LV e' lateral:   5.77 cm/s  LVOT diam:     2.50 cm   LV E/e' lateral: 10.5  LV  SV:         118  LV SV Index:   54  LVOT Area:     4.91 cm     RIGHT VENTRICLE  RV S prime:     16.60 cm/s   LEFT ATRIUM             Index        RIGHT ATRIUM           Index  LA diam:        4.20 cm 1.92 cm/m   RA Area:     11.60 cm  LA Vol (A2C):   37.3 ml 17.08 ml/m  RA Volume:   19.00 ml  8.70 ml/m  LA Vol (A4C):   34.8 ml 15.94 ml/m  LA Biplane Vol: 37.1 ml 16.99 ml/m   AORTIC VALVE  LVOT Vmax:   143.00 cm/s  LVOT Vmean:  89.900 cm/s  LVOT VTI:    0.241 m    AORTA  Ao Root diam: 3.90 cm  Ao Asc diam:  4.57 cm   MITRAL VALVE               TRICUSPID VALVE  MV Area (PHT): 2.55 cm    TV Peak grad:   10.8 mmHg  MV Decel Time: 297 msec    TV Mean grad:   5.0 mmHg  MV E velocity: 60.80 cm/s  TV Vmax:        1.64 m/s  MV A velocity: 90.40 cm/s  TV Vmean:       101.0 cm/s  MV E/A ratio:  0.67        TV VTI:         0.40 msec                               SHUNTS                             Systemic VTI:  0.24 m                             Systemic Diam: 2.50 cm   Rudean Haskell MD  Electronically signed by Rudean Haskell MD  Signature Date/Time: 06/16/2022/2:16:54 PM        Final      Physicians  Panel Physicians Referring Physician Case Authorizing Physician  Sherren Mocha, MD (Primary)     Procedures  LEFT HEART CATH AND CORONARY ANGIOGRAPHY   Conclusion  1.  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  Normal LVEDP   Indications  Aneurysm of ascending aorta without rupture (HCC) [id number] (ICD-10-CM)]   Procedural Details  Technical Details INDICATION: thoracic aortic aneurysm of the ascending aorta without rupture. Preop study  PROCEDURAL DETAILS: The right wrist is prepped, draped, and anesthetized with 1% lidocaine. Using the modified Seldinger technique, a 5/6 French Slender sheath is introduced into the right radial artery. 3 mg of verapamil is administered through the sheath, weight-based unfractionated  heparin was administered intravenously. Standard Judkins catheters are used for selective coronary angiography. LV pressure is recorded and an aortic valve pullback gradient is measured.  Catheter exchanges are performed over an exchange length guidewire. There are no immediate procedural complications. A TR band is used for radial hemostasis at the completion of the procedure.  The patient was transferred to the post catheterization recovery area for further monitoring.      Estimated blood loss <50 mL.   During this procedure medications were administered to achieve and maintain moderate conscious sedation while the patient's heart rate, blood pressure, and oxygen saturation were continuously monitored and I was present face-to-face 100% of this time.   Medications (Filter: Administrations occurring from 0719 to 0805 on 07/20/22)  important  Continuous medications are totaled by the amount administered until 07/20/22 0805.   fentaNYL (SUBLIMAZE) injection (mcg)  Total dose: 25 mcg Date/Time Rate/Dose/Volume Action   07/20/22 0733 25 mcg Given   midazolam (VERSED) injection (mg)  Total dose: 1 mg Date/Time Rate/Dose/Volume Action   07/20/22 0733 1 mg Given   lidocaine (PF) (XYLOCAINE) 1 % injection (mL)  Total volume: 2 mL Date/Time Rate/Dose/Volume Action   07/20/22 0744 2 mL Given   Radial Cocktail/Verapamil only  Total dose: Cannot be calculated* *Administration dose not documented Date/Time Rate/Dose/Volume Action   07/20/22 0745  Given   Heparin (Porcine) in NaCl 1000-0.9 UT/500ML-% SOLN (mL)  Total volume: 1,000 mL Date/Time Rate/Dose/Volume Action   07/20/22 0746 500 mL Given   0746 500 mL Given   heparin sodium (porcine) injection (Units)  Total dose: 5,000 Units Date/Time Rate/Dose/Volume Action   07/20/22 0748 5,000 Units Given   iohexol (OMNIPAQUE) 350 MG/ML injection (mL)  Total volume: 55 mL Date/Time Rate/Dose/Volume Action   07/20/22 0800 55 mL Given     Sedation  Time  Sedation Time Physician-1: 23 minutes 30 seconds Radiation/Fluoro  Fluoro time: 4.3 (min) DAP: [id number] (mGycm2) Cumulative Air Kerma: 123456 (mGy) Complications  Complications documented before study signed (07/20/2022  [id number] AM)   No complications were associated with this study.  Documented by Donney Dice., RN - 07/20/2022  7:58 AM     Coronary Findings  Diagnostic Dominance: Right Left Main  The left main is large without significant stenosis. The left main bifurcates into the LAD and left circumflex with a tiny intermediate branch.    Left Anterior Descending  There is mild diffuse disease throughout the vessel. The LAD is patent. The vessel has mild plaquing in its midportion after the first diagonal. The first diagonal branch is moderate in caliber with nonobstructive ostial stenosis and the second and third diagonals are relatively small in caliber.    Left Circumflex  The circumflex is patent without significant stenosis. The first OM is patent. The second OM is patent without significant disease.    Right Coronary Artery  Vessel is large. The vessel exhibits minimal luminal irregularities. This is a large, dominant vessel. There is minimal irregularity without significant stenosis. The vessel supplies a PDA branch that has no significant stenosis. The PLA branches are small without any significant disease.    Intervention   No interventions have been documented.   Coronary Diagrams  Diagnostic Dominance: Right  Intervention   Implants   No implant documentation for this case.   Syngo Images   Show images for CARDIAC CATHETERIZATION Images on Long Term Storage   Show images for [place], [place] to Procedure Log  Procedure Log    Hemo Data  Flowsheet Row Most Recent Value  AO Systolic Pressure 0 mmHg  AO Diastolic Pressure 0 mmHg  AO Mean 0 mmHg  LV Systolic Pressure 123456 mmHg  LV Diastolic Pressure 3 mmHg  LV EDP 11 mmHg  AOp  Systolic Pressure [id number] mmHg  AOp Diastolic Pressure 70 mmHg  AOp Mean Pressure 99 mmHg  LVp Systolic Pressure [id number] mmHg  LVp Diastolic Pressure 23 mmHg  LVp EDP Pressure 11 mmHg   Impression:   This 62 year old gentleman has a 5.0 cm fusiform ascending aortic aneurysm that appears to begin above the sinotubular junction and extend up to the proximal aortic arch just before the innominate artery where it returns normal size.  This has increased in size from 4.2-4.3 cm in 10-21-02 to 4.7 cm in 10/20/2016 and now 5 cm in 2021-10-20.  He does have a family history of aortic dissection in a brother who died from that in 10-20-00.  2D echocardiogram shows a trileaflet aortic valve with no aortic insufficiency.  Cardiac catheterization showed no coronary obstructive disease.  I have recommended proceeding with surgical repair of his aneurysm given the risk factors of his brother dying from an aortic dissection and his aneurysm is currently increasing in size and 5 cm.  He is a relatively young healthy gentleman who should do well with surgical treatment.  I have reviewed the CTA images with him and his wife and answered their questions.  I think he will probably require supra-coronary ascending aortic replacement using a brief period of circulatory arrest. The aortic root does not appear dilated.     Plan:  Supra coronary replacement of an ascending aortic aneurysm using deep hypothermic circulatory arrest.  He will only require 1 arterial line for surgery.   [name], MD [facility] [phone]

## 2022-09-04 NOTE — Progress Notes (Signed)
Anesthesia Chart Review:  Preop labs notable for elevated transaminases, AST 242 and ALT 138.  Review of history shows patient with periodic mild to moderate elevations of these in the past, however not quite to these levels.  His PCP Dr. Frederico Hamman Copland has followed this closely in the past and it has been attributed to EtOH intake as well as mild hepatic steatosis.  He recently had right upper quadrant ultrasound 04/29/2022 that showed increased hepatic parenchymal echogenicity suggestive of steatosis and stable 1.4 cm hypoechoic mass in the right hepatic lobe that has been noted on prior imaging.    I called and spoke with the patient regarding his abnormal labs.  He does admit to increased alcohol intake over the last several months due to multiple life stressors, anywhere from 2-6 beers per day (he reports these are ~7% ABV).  He had recently been using the Tylenol for orthopedic pains, although he states he has not been using this regularly for the past couple months.  He understands that he needs to reduce his alcohol intake and states he will actively start working to do this.  He says he is able to stop drinking without any ill effects. He reports that he specifically does not drink on weekends when he has to work. He says he has discussed this previously with Dr. Lorelei Pont and has had waxing and waning alcohol use over the years.  He also reports he has had significant periods of time with minimal to no alcohol intake.  I advised him that I would make the anesthesiologists as well as Dr. Cyndia Bent aware so that we are on the same page regarding the cause of his transaminitis.  Labs discussed with anesthesiologist Dr. Therisa Doyne.  He advised okay to proceed as planned barring acute status change.  Remainder of preop labs reviewed, unremarkable.  EKG 09/03/2022: Normal sinus rhythm.  Rate 80. Cannot rule out Inferior infarct , age undetermined. Anterior infarct , age undetermined  CT chest  04/28/2022: MPRESSION: 1. Ascending aortic aneurysm measuring up to 5 cm. Ascending thoracic aortic aneurysm. Recommend semi-annual imaging followup by CTA or MRA and referral to cardiothoracic surgery if not already obtained. This recommendation follows 2010 ACCF/AHA/AATS/ACR/ASA/SCA/SCAI/SIR/STS/SVM Guidelines for the Diagnosis and Management of Patients With Thoracic Aortic Disease. Circulation. 2010; [id number]. Aortic aneurysm NOS (ICD10-I71.9) 2. Hepatic steatosis  Cath 07/20/2022: 1.  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  Normal LVEDP  TTE 06/16/2022: 1. Left ventricular ejection fraction, by estimation, is 70 to 75%. The  left ventricle has hyperdynamic function. Left ventricular endocardial  border not optimally defined to evaluate regional wall motion.  Indeterminate diastolic filling due to E-A  fusion.   2. Right ventricular systolic function is hyperdynamic. The right  ventricular size is normal.   3. The mitral valve was not well visualized. No evidence of mitral valve  regurgitation. No evidence of mitral stenosis.   4. The aortic valve is tricuspid. Aortic valve regurgitation is not  visualized.   5. Aortic dilatation noted. There is moderate dilatation of the ascending  aorta, measuring 48 mm. There is mild dilatation of the aortic arch,  measuring 43 mm.   6. Technically difficult study with evidence of aortic dilation in the  ascending aorta and arch.     Wynonia Musty [facility]/Anesthesiology Phone [phone] 09/04/2022 4:08 PM

## 2022-09-04 NOTE — Telephone Encounter (Signed)
Atorvastatin sent to [facility]. Lipids, liver ordered and scheduled for 12/03/22.

## 2022-09-04 NOTE — Anesthesia Preprocedure Evaluation (Signed)
Anesthesia Evaluation  Patient identified by MRN, date of birth, ID band Patient awake    Reviewed: Allergy & Precautions, H&P , NPO status , Patient's Chart, lab work & pertinent test results  Airway Mallampati: II   Neck ROM: full    Dental   Pulmonary    breath sounds clear to auscultation       Cardiovascular hypertension,  Rhythm:regular Rate:Normal  Ascending aortic dilation (4.8cm)  EF 70%, normal valvular function, normal coronaries.   Neuro/Psych    GI/Hepatic ,GERD  ,,  Endo/Other    Renal/GU      Musculoskeletal   Abdominal   Peds  Hematology   Anesthesia Other Findings   Reproductive/Obstetrics                             Anesthesia Physical Anesthesia Plan  ASA: 3  Anesthesia Plan: General   Post-op Pain Management:    Induction: Intravenous  PONV Risk Score and Plan: 2 and Ondansetron, Dexamethasone, Midazolam and Treatment may vary due to age or medical condition  Airway Management Planned: Oral ETT  Additional Equipment: Arterial line, CVP, PA Cath, TEE and Ultrasound Guidance Line Placement  Intra-op Plan:   Post-operative Plan: Post-operative intubation/ventilation  Informed Consent: I have reviewed the patients History and Physical, chart, labs and discussed the procedure including the risks, benefits and alternatives for the proposed anesthesia with the patient or authorized representative who has indicated his/her understanding and acceptance.     Dental advisory given  Plan Discussed with: CRNA, Anesthesiologist and Surgeon  Anesthesia Plan Comments: (PAT note by Karoline Caldwell, PA-C: Preop labs notable for elevated transaminases, AST 242 and ALT 138.  Review of history shows patient with periodic mild to moderate elevations of these in the past, however not quite to these levels.  His PCP Dr. Frederico Hamman Copland has followed this closely in the past and it  has been attributed to EtOH intake as well as mild hepatic steatosis.  He recently had right upper quadrant ultrasound 04/29/2022 that showed increased hepatic parenchymal echogenicity suggestive of steatosis and stable 1.4 cm hypoechoic mass in the right hepatic lobe that has been noted on prior imaging.    I called and spoke with the patient regarding his abnormal labs.  He does admit to increased alcohol intake over the last several months due to multiple life stressors, anywhere from 2-6 beers per day (he reports these are ~7% ABV).  He had recently been using the Tylenol for orthopedic pains, although he states he has not been using this regularly for the past couple months.  He understands that he needs to reduce his alcohol intake and states he will actively start working to do this.  He says he is able to stop drinking without any ill effects. He reports that he specifically does not drink on weekends when he has to work. He says he has discussed this previously with Dr. Lorelei Pont and has had waxing and waning alcohol use over the years.  He also reports he has had significant periods of time with minimal to no alcohol intake.  I advised him that I would make the anesthesiologists as well as Dr. Cyndia Bent aware so that we are on the same page regarding the cause of his transaminitis.  Labs discussed with anesthesiologist Dr. Therisa Doyne.  He advised okay to proceed as planned barring acute status change.  Remainder of preop labs reviewed, unremarkable.  EKG 09/03/2022: Normal  sinus rhythm.  Rate 80. Cannot rule out Inferior infarct , age undetermined. Anterior infarct , age undetermined  CT chest 04/28/2022: MPRESSION: 1. Ascending aortic aneurysm measuring up to 5 cm. Ascending thoracic aortic aneurysm. Recommend semi-annual imaging followup by CTA or MRA and referral to cardiothoracic surgery if not already obtained. This recommendation follows 2010 ACCF/AHA/AATS/ACR/ASA/SCA/SCAI/SIR/STS/SVM  Guidelines for the Diagnosis and Management of Patients With Thoracic Aortic Disease. Circulation. 2010; [id number]. Aortic aneurysm NOS (ICD10-I71.9) 2. Hepatic steatosis  Cath 07/20/2022: 1. 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 Normal LVEDP  TTE 06/16/2022: 1. Left ventricular ejection fraction, by estimation, is 70 to 75%. The  left ventricle has hyperdynamic function. Left ventricular endocardial  border not optimally defined to evaluate regional wall motion.  Indeterminate diastolic filling due to E-A  fusion.  2. Right ventricular systolic function is hyperdynamic. The right  ventricular size is normal.  3. The mitral valve was not well visualized. No evidence of mitral valve  regurgitation. No evidence of mitral stenosis.  4. The aortic valve is tricuspid. Aortic valve regurgitation is not  visualized.  5. Aortic dilatation noted. There is moderate dilatation of the ascending  aorta, measuring 48 mm. There is mild dilatation of the aortic arch,  measuring 43 mm.  6. Technically difficult study with evidence of aortic dilation in the  ascending aorta and arch.    )        Anesthesia Quick Evaluation

## 2022-09-04 NOTE — Telephone Encounter (Signed)
-----   Message from Sherren Mocha, MD sent at 09/04/2022  8:22 AM EST ----- Morning [name]. Could you call in atorvastatin 20 mg daily for him? He was found to have nonobstructive CAD on cath and has lipids above goal. Should have lipids/LFT's in 3 months. Pharmacy is [facility] OP pharmacy. thx

## 2022-09-07 ENCOUNTER — Other Ambulatory Visit: Payer: Self-pay

## 2022-09-07 ENCOUNTER — Ambulatory Visit (HOSPITAL_COMMUNITY): Payer: 59 | Attending: Surgery

## 2022-09-07 ENCOUNTER — Encounter (HOSPITAL_COMMUNITY): Payer: Self-pay | Admitting: Surgery

## 2022-09-07 ENCOUNTER — Encounter (HOSPITAL_COMMUNITY)
Admission: RE | Disposition: A | Discharge status: Home Health | Payer: Self-pay | Source: Home / Self Care | Attending: Surgery

## 2022-09-07 ENCOUNTER — Inpatient Hospital Stay (HOSPITAL_COMMUNITY)
Admission: RE | Admit: 2022-09-07 | Discharge: 2022-09-11 | DRG: 220 | Disposition: A | Discharge status: Home Health | Payer: 59 | Attending: Surgery | Admitting: Surgery

## 2022-09-07 ENCOUNTER — Inpatient Hospital Stay (HOSPITAL_COMMUNITY): Payer: 59 | Admitting: Physician Assistant

## 2022-09-07 ENCOUNTER — Inpatient Hospital Stay (HOSPITAL_COMMUNITY): Payer: 59

## 2022-09-07 DIAGNOSIS — I10 Essential (primary) hypertension: Secondary | ICD-10-CM

## 2022-09-07 DIAGNOSIS — R918 Other nonspecific abnormal finding of lung field: Secondary | ICD-10-CM | POA: Diagnosis not present

## 2022-09-07 DIAGNOSIS — Z8249 Family history of ischemic heart disease and other diseases of the circulatory system: Secondary | ICD-10-CM | POA: Diagnosis not present

## 2022-09-07 DIAGNOSIS — E785 Hyperlipidemia, unspecified: Secondary | ICD-10-CM | POA: Diagnosis not present

## 2022-09-07 DIAGNOSIS — D62 Acute posthemorrhagic anemia: Secondary | ICD-10-CM | POA: Diagnosis not present

## 2022-09-07 DIAGNOSIS — Z85828 Personal history of other malignant neoplasm of skin: Secondary | ICD-10-CM | POA: Diagnosis not present

## 2022-09-07 DIAGNOSIS — Z8546 Personal history of malignant neoplasm of prostate: Secondary | ICD-10-CM

## 2022-09-07 DIAGNOSIS — Z634 Disappearance and death of family member: Secondary | ICD-10-CM

## 2022-09-07 DIAGNOSIS — F4310 Post-traumatic stress disorder, unspecified: Secondary | ICD-10-CM | POA: Diagnosis present

## 2022-09-07 DIAGNOSIS — I700 Atherosclerosis of aorta: Secondary | ICD-10-CM | POA: Diagnosis not present

## 2022-09-07 DIAGNOSIS — Z974 Presence of external hearing-aid: Secondary | ICD-10-CM | POA: Diagnosis not present

## 2022-09-07 DIAGNOSIS — E8770 Fluid overload, unspecified: Secondary | ICD-10-CM | POA: Diagnosis not present

## 2022-09-07 DIAGNOSIS — I081 Rheumatic disorders of both mitral and tricuspid valves: Secondary | ICD-10-CM | POA: Diagnosis not present

## 2022-09-07 DIAGNOSIS — Z83438 Family history of other disorder of lipoprotein metabolism and other lipidemia: Secondary | ICD-10-CM | POA: Diagnosis not present

## 2022-09-07 DIAGNOSIS — Z9079 Acquired absence of other genital organ(s): Secondary | ICD-10-CM

## 2022-09-07 DIAGNOSIS — I2510 Atherosclerotic heart disease of native coronary artery without angina pectoris: Secondary | ICD-10-CM | POA: Diagnosis present

## 2022-09-07 DIAGNOSIS — Z7951 Long term (current) use of inhaled steroids: Secondary | ICD-10-CM | POA: Diagnosis not present

## 2022-09-07 DIAGNOSIS — Z87891 Personal history of nicotine dependence: Secondary | ICD-10-CM

## 2022-09-07 DIAGNOSIS — I712 Thoracic aortic aneurysm, without rupture, unspecified: Secondary | ICD-10-CM | POA: Diagnosis not present

## 2022-09-07 DIAGNOSIS — Z79899 Other long term (current) drug therapy: Secondary | ICD-10-CM

## 2022-09-07 DIAGNOSIS — J4489 Other specified chronic obstructive pulmonary disease: Secondary | ICD-10-CM | POA: Diagnosis present

## 2022-09-07 DIAGNOSIS — K219 Gastro-esophageal reflux disease without esophagitis: Secondary | ICD-10-CM | POA: Diagnosis present

## 2022-09-07 DIAGNOSIS — L570 Actinic keratosis: Secondary | ICD-10-CM | POA: Diagnosis present

## 2022-09-07 DIAGNOSIS — Z85850 Personal history of malignant neoplasm of thyroid: Secondary | ICD-10-CM

## 2022-09-07 DIAGNOSIS — Z8679 Personal history of other diseases of the circulatory system: Principal | ICD-10-CM

## 2022-09-07 DIAGNOSIS — Z8042 Family history of malignant neoplasm of prostate: Secondary | ICD-10-CM | POA: Diagnosis not present

## 2022-09-07 DIAGNOSIS — H9193 Unspecified hearing loss, bilateral: Secondary | ICD-10-CM | POA: Diagnosis present

## 2022-09-07 DIAGNOSIS — Z0389 Encounter for observation for other suspected diseases and conditions ruled out: Secondary | ICD-10-CM | POA: Diagnosis not present

## 2022-09-07 DIAGNOSIS — I7121 Aneurysm of the ascending aorta, without rupture: Principal | ICD-10-CM

## 2022-09-07 DIAGNOSIS — J9811 Atelectasis: Secondary | ICD-10-CM | POA: Diagnosis not present

## 2022-09-07 HISTORY — PX: REPLACEMENT ASCENDING AORTA: SHX6068

## 2022-09-07 HISTORY — PX: TEE WITHOUT CARDIOVERSION: SHX5443

## 2022-09-07 LAB — POCT I-STAT, CHEM 8
BUN: 13 mg/dL (ref 6–20)
BUN: 13 mg/dL (ref 6–20)
BUN: 13 mg/dL (ref 6–20)
BUN: 14 mg/dL (ref 6–20)
BUN: 13 mg/dL (ref 6–20)
Calcium, Ion: 1.25 mmol/L (ref 1.15–1.40)
Calcium, Ion: 1.15 mmol/L (ref 1.15–1.40)
Calcium, Ion: 1.21 mmol/L (ref 1.15–1.40)
Calcium, Ion: 1.08 mmol/L — ABNORMAL LOW (ref 1.15–1.40)
Calcium, Ion: 1.07 mmol/L — ABNORMAL LOW (ref 1.15–1.40)
Chloride: 101 mmol/L (ref 98–111)
Chloride: 100 mmol/L (ref 98–111)
Chloride: 97 mmol/L — ABNORMAL LOW (ref 98–111)
Chloride: 100 mmol/L (ref 98–111)
Chloride: 100 mmol/L (ref 98–111)
Creatinine, Ser: 0.9 mg/dL (ref 0.61–1.24)
Creatinine, Ser: 0.8 mg/dL (ref 0.61–1.24)
Creatinine, Ser: 0.9 mg/dL (ref 0.61–1.24)
Creatinine, Ser: 0.8 mg/dL (ref 0.61–1.24)
Creatinine, Ser: 0.8 mg/dL (ref 0.61–1.24)
Glucose, Bld: 106 mg/dL — ABNORMAL HIGH (ref 70–99)
Glucose, Bld: 103 mg/dL — ABNORMAL HIGH (ref 70–99)
Glucose, Bld: 108 mg/dL — ABNORMAL HIGH (ref 70–99)
Glucose, Bld: 176 mg/dL — ABNORMAL HIGH (ref 70–99)
Glucose, Bld: 139 mg/dL — ABNORMAL HIGH (ref 70–99)
HCT: 35 % — ABNORMAL LOW (ref 39.0–52.0)
HCT: 28 % — ABNORMAL LOW (ref 39.0–52.0)
HCT: 31 % — ABNORMAL LOW (ref 39.0–52.0)
HCT: 26 % — ABNORMAL LOW (ref 39.0–52.0)
HCT: 26 % — ABNORMAL LOW (ref 39.0–52.0)
Hemoglobin: 11.9 g/dL — ABNORMAL LOW (ref 13.0–17.0)
Hemoglobin: 10.5 g/dL — ABNORMAL LOW (ref 13.0–17.0)
Hemoglobin: 9.5 g/dL — ABNORMAL LOW (ref 13.0–17.0)
Hemoglobin: 8.8 g/dL — ABNORMAL LOW (ref 13.0–17.0)
Hemoglobin: 8.8 g/dL — ABNORMAL LOW (ref 13.0–17.0)
Potassium: 4.2 mmol/L (ref 3.5–5.1)
Potassium: 3.7 mmol/L (ref 3.5–5.1)
Potassium: 4.2 mmol/L (ref 3.5–5.1)
Potassium: 4.9 mmol/L (ref 3.5–5.1)
Potassium: 4.7 mmol/L (ref 3.5–5.1)
Sodium: 137 mmol/L (ref 135–145)
Sodium: 135 mmol/L (ref 135–145)
Sodium: 137 mmol/L (ref 135–145)
Sodium: 134 mmol/L — ABNORMAL LOW (ref 135–145)
Sodium: 136 mmol/L (ref 135–145)
TCO2: 30 mmol/L (ref 22–32)
TCO2: 27 mmol/L (ref 22–32)
TCO2: 30 mmol/L (ref 22–32)
TCO2: 26 mmol/L (ref 22–32)
TCO2: 27 mmol/L (ref 22–32)

## 2022-09-07 LAB — POCT I-STAT 7, (LYTES, BLD GAS, ICA,H+H)
Acid-Base Excess: 2 mmol/L (ref 0.0–2.0)
Acid-Base Excess: 1 mmol/L (ref 0.0–2.0)
Acid-Base Excess: 1 mmol/L (ref 0.0–2.0)
Acid-Base Excess: 0 mmol/L (ref 0.0–2.0)
Acid-base deficit: 1 mmol/L (ref 0.0–2.0)
Acid-base deficit: 3 mmol/L — ABNORMAL HIGH (ref 0.0–2.0)
Acid-base deficit: 2 mmol/L (ref 0.0–2.0)
Bicarbonate: 27.7 mmol/L (ref 20.0–28.0)
Bicarbonate: 25.5 mmol/L (ref 20.0–28.0)
Bicarbonate: 27.7 mmol/L (ref 20.0–28.0)
Bicarbonate: 25.1 mmol/L (ref 20.0–28.0)
Bicarbonate: 25.1 mmol/L (ref 20.0–28.0)
Bicarbonate: 23.1 mmol/L (ref 20.0–28.0)
Bicarbonate: 23.5 mmol/L (ref 20.0–28.0)
Calcium, Ion: 1.22 mmol/L (ref 1.15–1.40)
Calcium, Ion: 1.1 mmol/L — ABNORMAL LOW (ref 1.15–1.40)
Calcium, Ion: 1.07 mmol/L — ABNORMAL LOW (ref 1.15–1.40)
Calcium, Ion: 1.06 mmol/L — ABNORMAL LOW (ref 1.15–1.40)
Calcium, Ion: 1.15 mmol/L (ref 1.15–1.40)
Calcium, Ion: 1.15 mmol/L (ref 1.15–1.40)
Calcium, Ion: 1.14 mmol/L — ABNORMAL LOW (ref 1.15–1.40)
HCT: 35 % — ABNORMAL LOW (ref 39.0–52.0)
HCT: 27 % — ABNORMAL LOW (ref 39.0–52.0)
HCT: 25 % — ABNORMAL LOW (ref 39.0–52.0)
HCT: 25 % — ABNORMAL LOW (ref 39.0–52.0)
HCT: 33 % — ABNORMAL LOW (ref 39.0–52.0)
HCT: 34 % — ABNORMAL LOW (ref 39.0–52.0)
HCT: 32 % — ABNORMAL LOW (ref 39.0–52.0)
Hemoglobin: 11.9 g/dL — ABNORMAL LOW (ref 13.0–17.0)
Hemoglobin: 9.2 g/dL — ABNORMAL LOW (ref 13.0–17.0)
Hemoglobin: 8.5 g/dL — ABNORMAL LOW (ref 13.0–17.0)
Hemoglobin: 8.5 g/dL — ABNORMAL LOW (ref 13.0–17.0)
Hemoglobin: 11.2 g/dL — ABNORMAL LOW (ref 13.0–17.0)
Hemoglobin: 11.6 g/dL — ABNORMAL LOW (ref 13.0–17.0)
Hemoglobin: 10.9 g/dL — ABNORMAL LOW (ref 13.0–17.0)
O2 Saturation: 100 %
O2 Saturation: 100 %
O2 Saturation: 100 %
O2 Saturation: 100 %
O2 Saturation: 97 %
O2 Saturation: 98 %
O2 Saturation: 97 %
Patient temperature: 35.3
Patient temperature: 36.9
Patient temperature: 37
Potassium: 4.1 mmol/L (ref 3.5–5.1)
Potassium: 4.3 mmol/L (ref 3.5–5.1)
Potassium: 4.3 mmol/L (ref 3.5–5.1)
Potassium: 4.7 mmol/L (ref 3.5–5.1)
Potassium: 4.9 mmol/L (ref 3.5–5.1)
Potassium: 4.7 mmol/L (ref 3.5–5.1)
Potassium: 5.1 mmol/L (ref 3.5–5.1)
Sodium: 136 mmol/L (ref 135–145)
Sodium: 138 mmol/L (ref 135–145)
Sodium: 134 mmol/L — ABNORMAL LOW (ref 135–145)
Sodium: 136 mmol/L (ref 135–145)
Sodium: 137 mmol/L (ref 135–145)
Sodium: 137 mmol/L (ref 135–145)
Sodium: 136 mmol/L (ref 135–145)
TCO2: 29 mmol/L (ref 22–32)
TCO2: 27 mmol/L (ref 22–32)
TCO2: 29 mmol/L (ref 22–32)
TCO2: 26 mmol/L (ref 22–32)
TCO2: 27 mmol/L (ref 22–32)
TCO2: 24 mmol/L (ref 22–32)
TCO2: 25 mmol/L (ref 22–32)
pCO2 arterial: 48.5 mmHg — ABNORMAL HIGH (ref 32–48)
pCO2 arterial: 40.5 mmHg (ref 32–48)
pCO2 arterial: 52.5 mmHg — ABNORMAL HIGH (ref 32–48)
pCO2 arterial: 40 mmHg (ref 32–48)
pCO2 arterial: 45.2 mmHg (ref 32–48)
pCO2 arterial: 42.6 mmHg (ref 32–48)
pCO2 arterial: 41.5 mmHg (ref 32–48)
pH, Arterial: 7.366 (ref 7.35–7.45)
pH, Arterial: 7.407 (ref 7.35–7.45)
pH, Arterial: 7.33 — ABNORMAL LOW (ref 7.35–7.45)
pH, Arterial: 7.405 (ref 7.35–7.45)
pH, Arterial: 7.344 — ABNORMAL LOW (ref 7.35–7.45)
pH, Arterial: 7.343 — ABNORMAL LOW (ref 7.35–7.45)
pH, Arterial: 7.361 (ref 7.35–7.45)
pO2, Arterial: 332 mmHg — ABNORMAL HIGH (ref 83–108)
pO2, Arterial: 402 mmHg — ABNORMAL HIGH (ref 83–108)
pO2, Arterial: 451 mmHg — ABNORMAL HIGH (ref 83–108)
pO2, Arterial: 473 mmHg — ABNORMAL HIGH (ref 83–108)
pO2, Arterial: 92 mmHg (ref 83–108)
pO2, Arterial: 104 mmHg (ref 83–108)
pO2, Arterial: 91 mmHg (ref 83–108)

## 2022-09-07 LAB — CBC
HCT: 33.9 % — ABNORMAL LOW (ref 39.0–52.0)
HCT: 34.1 % — ABNORMAL LOW (ref 39.0–52.0)
HCT: 32.6 % — ABNORMAL LOW (ref 39.0–52.0)
Hemoglobin: 11.8 g/dL — ABNORMAL LOW (ref 13.0–17.0)
Hemoglobin: 11.8 g/dL — ABNORMAL LOW (ref 13.0–17.0)
Hemoglobin: 11.1 g/dL — ABNORMAL LOW (ref 13.0–17.0)
MCH: 33.9 pg (ref 26.0–34.0)
MCH: 33.9 pg (ref 26.0–34.0)
MCH: 33.2 pg (ref 26.0–34.0)
MCHC: 34.8 g/dL (ref 30.0–36.0)
MCHC: 34.6 g/dL (ref 30.0–36.0)
MCHC: 34 g/dL (ref 30.0–36.0)
MCV: 97.4 fL (ref 80.0–100.0)
MCV: 98 fL (ref 80.0–100.0)
MCV: 97.6 fL (ref 80.0–100.0)
Platelets: 199 10*3/uL (ref 150–400)
Platelets: 207 10*3/uL (ref 150–400)
Platelets: 201 10*3/uL (ref 150–400)
RBC: 3.48 MIL/uL — ABNORMAL LOW (ref 4.22–5.81)
RBC: 3.48 MIL/uL — ABNORMAL LOW (ref 4.22–5.81)
RBC: 3.34 MIL/uL — ABNORMAL LOW (ref 4.22–5.81)
RDW: 11.9 % (ref 11.5–15.5)
RDW: 11.8 % (ref 11.5–15.5)
RDW: 11.7 % (ref 11.5–15.5)
WBC: 9.2 10*3/uL (ref 4.0–10.5)
WBC: 9.1 10*3/uL (ref 4.0–10.5)
WBC: 8.4 10*3/uL (ref 4.0–10.5)
nRBC: 0 % (ref 0.0–0.2)
nRBC: 0 % (ref 0.0–0.2)
nRBC: 0 % (ref 0.0–0.2)

## 2022-09-07 LAB — POCT I-STAT EG7
Acid-Base Excess: 2 mmol/L (ref 0.0–2.0)
Bicarbonate: 28.1 mmol/L — ABNORMAL HIGH (ref 20.0–28.0)
Calcium, Ion: 1.16 mmol/L (ref 1.15–1.40)
HCT: 28 % — ABNORMAL LOW (ref 39.0–52.0)
Hemoglobin: 9.5 g/dL — ABNORMAL LOW (ref 13.0–17.0)
O2 Saturation: 82 %
Potassium: 4 mmol/L (ref 3.5–5.1)
Sodium: 140 mmol/L (ref 135–145)
TCO2: 30 mmol/L (ref 22–32)
pCO2, Ven: 49.3 mmHg (ref 44–60)
pH, Ven: 7.364 (ref 7.25–7.43)
pO2, Ven: 49 mmHg — ABNORMAL HIGH (ref 32–45)

## 2022-09-07 LAB — HEMOGLOBIN AND HEMATOCRIT, BLOOD
HCT: 26 % — ABNORMAL LOW (ref 39.0–52.0)
Hemoglobin: 9.2 g/dL — ABNORMAL LOW (ref 13.0–17.0)

## 2022-09-07 LAB — PROTIME-INR
INR: 1.3 — ABNORMAL HIGH (ref 0.8–1.2)
Prothrombin Time: 16.4 seconds — ABNORMAL HIGH (ref 11.4–15.2)

## 2022-09-07 LAB — GLUCOSE, CAPILLARY
Glucose-Capillary: 134 mg/dL — ABNORMAL HIGH (ref 70–99)
Glucose-Capillary: 136 mg/dL — ABNORMAL HIGH (ref 70–99)
Glucose-Capillary: 136 mg/dL — ABNORMAL HIGH (ref 70–99)
Glucose-Capillary: 136 mg/dL — ABNORMAL HIGH (ref 70–99)
Glucose-Capillary: 140 mg/dL — ABNORMAL HIGH (ref 70–99)
Glucose-Capillary: 144 mg/dL — ABNORMAL HIGH (ref 70–99)
Glucose-Capillary: 144 mg/dL — ABNORMAL HIGH (ref 70–99)
Glucose-Capillary: 145 mg/dL — ABNORMAL HIGH (ref 70–99)
Glucose-Capillary: 147 mg/dL — ABNORMAL HIGH (ref 70–99)
Glucose-Capillary: 164 mg/dL — ABNORMAL HIGH (ref 70–99)
Glucose-Capillary: 183 mg/dL — ABNORMAL HIGH (ref 70–99)

## 2022-09-07 LAB — PLATELET COUNT: Platelets: 175 10*3/uL (ref 150–400)

## 2022-09-07 LAB — APTT: aPTT: 32 seconds (ref 24–36)

## 2022-09-07 LAB — FIBRINOGEN: Fibrinogen: 173 mg/dL — ABNORMAL LOW (ref 210–475)

## 2022-09-07 SURGERY — REPLACEMENT, AORTA, ASCENDING
Anesthesia: General

## 2022-09-07 MED ORDER — METHYLPREDNISOLONE SODIUM SUCC 125 MG IJ SOLR
INTRAMUSCULAR | Status: AC
  Filled 2022-09-07: qty 4

## 2022-09-07 MED ORDER — CHLORHEXIDINE GLUCONATE 4 % EX LIQD: 30.0000 mL | CUTANEOUS | Status: DC

## 2022-09-07 MED ORDER — ACETAMINOPHEN 160 MG/5ML PO SOLN: 650.0000 mg | Freq: Once | ORAL | Status: AC

## 2022-09-07 MED ORDER — SODIUM CHLORIDE 0.9% FLUSH
3.0000 mL | Freq: Two times a day (BID) | INTRAVENOUS | Status: DC
  Administered 2022-09-08 – 2022-09-10 (×5): 3 mL via INTRAVENOUS

## 2022-09-07 MED ORDER — FENTANYL CITRATE (PF) 100 MCG/2ML IJ SOLN: 25.0000 ug | INTRAMUSCULAR | Status: DC | PRN

## 2022-09-07 MED ORDER — VECURONIUM BROMIDE 10 MG IV SOLR
INTRAVENOUS | Status: AC
  Filled 2022-09-07: qty 10

## 2022-09-07 MED ORDER — BISACODYL 5 MG PO TBEC
10.0000 mg | DELAYED_RELEASE_TABLET | Freq: Every day | ORAL | Status: DC
  Administered 2022-09-08 – 2022-09-09 (×2): 10 mg via ORAL
  Filled 2022-09-07 (×2): qty 2

## 2022-09-07 MED ORDER — POTASSIUM CHLORIDE 10 MEQ/50ML IV SOLN: 10.0000 meq | INTRAVENOUS | Status: AC

## 2022-09-07 MED ORDER — CHLORHEXIDINE GLUCONATE CLOTH 2 % EX PADS
6.0000 | MEDICATED_PAD | Freq: Every day | CUTANEOUS | Status: DC
  Administered 2022-09-07 – 2022-09-09 (×3): 6 via TOPICAL

## 2022-09-07 MED ORDER — LACTATED RINGERS IV SOLN: INTRAVENOUS | Status: DC

## 2022-09-07 MED ORDER — 0.9 % SODIUM CHLORIDE (POUR BTL) OPTIME
TOPICAL | Status: DC | PRN
  Administered 2022-09-07: 5000 mL

## 2022-09-07 MED ORDER — VANCOMYCIN HCL IN DEXTROSE 1-5 GM/200ML-% IV SOLN
1000.0000 mg | Freq: Once | INTRAVENOUS | Status: AC
  Administered 2022-09-07: 1000 mg via INTRAVENOUS
  Filled 2022-09-07: qty 200

## 2022-09-07 MED ORDER — ORAL CARE MOUTH RINSE: 15.0000 mL | OROMUCOSAL | Status: DC | PRN

## 2022-09-07 MED ORDER — ONDANSETRON HCL 4 MG/2ML IJ SOLN
4.0000 mg | Freq: Four times a day (QID) | INTRAMUSCULAR | Status: DC | PRN
  Administered 2022-09-07 – 2022-09-08 (×3): 4 mg via INTRAVENOUS
  Filled 2022-09-07 (×3): qty 2

## 2022-09-07 MED ORDER — SODIUM CHLORIDE 0.9% IV SOLUTION: Freq: Once | INTRAVENOUS | Status: AC

## 2022-09-07 MED ORDER — SODIUM CHLORIDE 0.9 % IV SOLN: 250.0000 mL | INTRAVENOUS | Status: DC

## 2022-09-07 MED ORDER — SODIUM CHLORIDE 0.9 % IV SOLN: INTRAVENOUS | Status: DC | PRN

## 2022-09-07 MED ORDER — ASPIRIN 325 MG PO TBEC: 325.0000 mg | DELAYED_RELEASE_TABLET | Freq: Every day | ORAL | Status: DC

## 2022-09-07 MED ORDER — METOPROLOL TARTRATE 5 MG/5ML IV SOLN: 2.5000 mg | INTRAVENOUS | Status: DC | PRN

## 2022-09-07 MED ORDER — MORPHINE SULFATE (PF) 2 MG/ML IV SOLN
1.0000 mg | INTRAVENOUS | Status: DC | PRN
  Administered 2022-09-07 (×3): 2 mg via INTRAVENOUS
  Filled 2022-09-07 (×3): qty 1

## 2022-09-07 MED ORDER — ASPIRIN 81 MG PO CHEW: 324.0000 mg | CHEWABLE_TABLET | Freq: Every day | ORAL | Status: DC

## 2022-09-07 MED ORDER — SODIUM CHLORIDE 0.45 % IV SOLN: INTRAVENOUS | Status: DC | PRN

## 2022-09-07 MED ORDER — PROPOFOL 10 MG/ML IV BOLUS
INTRAVENOUS | Status: DC | PRN
  Administered 2022-09-07 (×2): 100 mg via INTRAVENOUS
  Administered 2022-09-07: 50 mg via INTRAVENOUS

## 2022-09-07 MED ORDER — THROMBIN (RECOMBINANT) 20000 UNITS EX SOLR
CUTANEOUS | Status: DC | PRN
  Administered 2022-09-07: 20000 [IU] via TOPICAL

## 2022-09-07 MED ORDER — HEMOSTATIC AGENTS (NO CHARGE) OPTIME
TOPICAL | Status: DC | PRN
  Administered 2022-09-07: 3 via TOPICAL

## 2022-09-07 MED ORDER — METOPROLOL TARTRATE 12.5 MG HALF TABLET
12.5000 mg | ORAL_TABLET | Freq: Two times a day (BID) | ORAL | Status: DC
  Administered 2022-09-08 (×2): 12.5 mg via ORAL
  Filled 2022-09-07 (×2): qty 1

## 2022-09-07 MED ORDER — PROTAMINE SULFATE 10 MG/ML IV SOLN
INTRAVENOUS | Status: DC | PRN
  Administered 2022-09-07: 130 mg via INTRAVENOUS
  Administered 2022-09-07: 150 mg via INTRAVENOUS
  Administered 2022-09-07: 20 mg via INTRAVENOUS

## 2022-09-07 MED ORDER — OXYCODONE HCL 5 MG/5ML PO SOLN: 5.0000 mg | Freq: Once | ORAL | Status: DC | PRN

## 2022-09-07 MED ORDER — HEPARIN SODIUM (PORCINE) 1000 UNIT/ML IJ SOLN
INTRAMUSCULAR | Status: AC
  Filled 2022-09-07: qty 1

## 2022-09-07 MED ORDER — PHENYLEPHRINE HCL-NACL 20-0.9 MG/250ML-% IV SOLN
0.0000 ug/min | INTRAVENOUS | Status: DC
  Filled 2022-09-07: qty 250

## 2022-09-07 MED ORDER — ORAL CARE MOUTH RINSE
15.0000 mL | Freq: Once | OROMUCOSAL | Status: AC
  Administered 2022-09-07: 15 mL via OROMUCOSAL

## 2022-09-07 MED ORDER — INSULIN REGULAR(HUMAN) IN NACL 100-0.9 UT/100ML-% IV SOLN: INTRAVENOUS | Status: DC

## 2022-09-07 MED ORDER — BISACODYL 10 MG RE SUPP: 10.0000 mg | Freq: Every day | RECTAL | Status: DC

## 2022-09-07 MED ORDER — MIDAZOLAM HCL 5 MG/5ML IJ SOLN
INTRAMUSCULAR | Status: DC | PRN
  Administered 2022-09-07: 2 mg via INTRAVENOUS
  Administered 2022-09-07: 3 mg via INTRAVENOUS
  Administered 2022-09-07: 2 mg via INTRAVENOUS
  Administered 2022-09-07: 1 mg via INTRAVENOUS
  Administered 2022-09-07: 2 mg via INTRAVENOUS

## 2022-09-07 MED ORDER — SUFENTANIL CITRATE 50 MCG/ML IV SOLN
INTRAVENOUS | Status: DC | PRN
  Administered 2022-09-07: 10 ug via INTRAVENOUS
  Administered 2022-09-07: 20 ug via INTRAVENOUS
  Administered 2022-09-07: 30 ug via INTRAVENOUS
  Administered 2022-09-07 (×2): 20 ug via INTRAVENOUS
  Administered 2022-09-07: 10 ug via INTRAVENOUS
  Administered 2022-09-07: 20 ug via INTRAVENOUS

## 2022-09-07 MED ORDER — SUFENTANIL CITRATE 50 MCG/ML IV SOLN
INTRAVENOUS | Status: AC
  Filled 2022-09-07: qty 1

## 2022-09-07 MED ORDER — METHYLPREDNISOLONE SODIUM SUCC 125 MG IJ SOLR
INTRAMUSCULAR | Status: DC | PRN
  Administered 2022-09-07: 250 mg via INTRAVENOUS

## 2022-09-07 MED ORDER — THROMBIN (RECOMBINANT) 20000 UNITS EX SOLR
CUTANEOUS | Status: AC
  Filled 2022-09-07: qty 20000

## 2022-09-07 MED ORDER — ACETAMINOPHEN 160 MG/5ML PO SOLN: 1000.0000 mg | Freq: Four times a day (QID) | ORAL | Status: DC

## 2022-09-07 MED ORDER — CHLORHEXIDINE GLUCONATE 0.12 % MT SOLN
15.0000 mL | Freq: Once | OROMUCOSAL | Status: AC
  Administered 2022-09-07: 15 mL via OROMUCOSAL
  Filled 2022-09-07: qty 15

## 2022-09-07 MED ORDER — ~~LOC~~ CARDIAC SURGERY, PATIENT & FAMILY EDUCATION
Freq: Once | Status: DC
  Filled 2022-09-07: qty 1

## 2022-09-07 MED ORDER — MIDAZOLAM HCL (PF) 10 MG/2ML IJ SOLN
INTRAMUSCULAR | Status: AC
  Filled 2022-09-07: qty 2

## 2022-09-07 MED ORDER — PROPOFOL 10 MG/ML IV BOLUS
INTRAVENOUS | Status: AC
  Filled 2022-09-07: qty 20

## 2022-09-07 MED ORDER — CHLORHEXIDINE GLUCONATE 0.12 % MT SOLN
15.0000 mL | OROMUCOSAL | Status: AC
  Administered 2022-09-07: 15 mL via OROMUCOSAL
  Filled 2022-09-07: qty 15

## 2022-09-07 MED ORDER — MIDAZOLAM HCL 2 MG/2ML IJ SOLN
2.0000 mg | INTRAMUSCULAR | Status: DC | PRN
  Administered 2022-09-07: 2 mg via INTRAVENOUS
  Filled 2022-09-07: qty 2

## 2022-09-07 MED ORDER — METOPROLOL TARTRATE 25 MG/10 ML ORAL SUSPENSION: 12.5000 mg | Freq: Two times a day (BID) | ORAL | Status: DC

## 2022-09-07 MED ORDER — OXYCODONE HCL 5 MG PO TABS
5.0000 mg | ORAL_TABLET | ORAL | Status: DC | PRN
  Administered 2022-09-07 – 2022-09-08 (×3): 10 mg via ORAL
  Administered 2022-09-10: 5 mg via ORAL
  Filled 2022-09-07: qty 2
  Filled 2022-09-07: qty 1
  Filled 2022-09-07 (×2): qty 2

## 2022-09-07 MED ORDER — ALBUMIN HUMAN 5 % IV SOLN: INTRAVENOUS | Status: DC | PRN

## 2022-09-07 MED ORDER — ACETAMINOPHEN 650 MG RE SUPP
650.0000 mg | Freq: Once | RECTAL | Status: AC
  Administered 2022-09-07: 650 mg via RECTAL

## 2022-09-07 MED ORDER — MAGNESIUM SULFATE 4 GM/100ML IV SOLN
4.0000 g | Freq: Once | INTRAVENOUS | Status: AC
  Administered 2022-09-07: 4 g via INTRAVENOUS
  Filled 2022-09-07: qty 100

## 2022-09-07 MED ORDER — HEPARIN SODIUM (PORCINE) 1000 UNIT/ML IJ SOLN
INTRAMUSCULAR | Status: DC | PRN
  Administered 2022-09-07: 34000 [IU] via INTRAVENOUS

## 2022-09-07 MED ORDER — DEXMEDETOMIDINE HCL IN NACL 400 MCG/100ML IV SOLN: 0.0000 ug/kg/h | INTRAVENOUS | Status: DC

## 2022-09-07 MED ORDER — METOPROLOL TARTRATE 12.5 MG HALF TABLET
12.5000 mg | ORAL_TABLET | Freq: Once | ORAL | Status: AC
  Administered 2022-09-07: 12.5 mg via ORAL
  Filled 2022-09-07: qty 1

## 2022-09-07 MED ORDER — HEMOSTATIC AGENTS (NO CHARGE) OPTIME
TOPICAL | Status: DC | PRN
  Administered 2022-09-07: 1 via TOPICAL

## 2022-09-07 MED ORDER — VECURONIUM BROMIDE 10 MG IV SOLR
INTRAVENOUS | Status: DC | PRN
  Administered 2022-09-07 (×2): 2 mg via INTRAVENOUS
  Administered 2022-09-07: 5 mg via INTRAVENOUS
  Administered 2022-09-07: 2 mg via INTRAVENOUS
  Administered 2022-09-07: 5 mg via INTRAVENOUS
  Administered 2022-09-07: 10 mg via INTRAVENOUS
  Administered 2022-09-07: 2 mg via INTRAVENOUS

## 2022-09-07 MED ORDER — ALBUMIN HUMAN 5 % IV SOLN
250.0000 mL | INTRAVENOUS | Status: DC | PRN
  Administered 2022-09-07: 12.5 g via INTRAVENOUS

## 2022-09-07 MED ORDER — LACTATED RINGERS IV SOLN: 500.0000 mL | Freq: Once | INTRAVENOUS | Status: DC | PRN

## 2022-09-07 MED ORDER — OXYCODONE HCL 5 MG PO TABS: 5.0000 mg | ORAL_TABLET | Freq: Once | ORAL | Status: DC | PRN

## 2022-09-07 MED ORDER — PROTAMINE SULFATE 10 MG/ML IV SOLN
INTRAVENOUS | Status: AC
  Filled 2022-09-07: qty 25

## 2022-09-07 MED ORDER — ACETAMINOPHEN 500 MG PO TABS
1000.0000 mg | ORAL_TABLET | Freq: Four times a day (QID) | ORAL | Status: DC
  Administered 2022-09-08 – 2022-09-10 (×8): 1000 mg via ORAL
  Filled 2022-09-07 (×8): qty 2

## 2022-09-07 MED ORDER — TRAMADOL HCL 50 MG PO TABS: 50.0000 mg | ORAL_TABLET | ORAL | Status: DC | PRN

## 2022-09-07 MED ORDER — FAMOTIDINE IN NACL 20-0.9 MG/50ML-% IV SOLN
20.0000 mg | Freq: Two times a day (BID) | INTRAVENOUS | Status: AC
  Administered 2022-09-07 (×2): 20 mg via INTRAVENOUS
  Filled 2022-09-07 (×2): qty 50

## 2022-09-07 MED ORDER — HEPARIN SODIUM (PORCINE) 1000 UNIT/ML IJ SOLN
INTRAMUSCULAR | Status: AC
  Filled 2022-09-07: qty 10

## 2022-09-07 MED ORDER — DOCUSATE SODIUM 100 MG PO CAPS
200.0000 mg | ORAL_CAPSULE | Freq: Every day | ORAL | Status: DC
  Administered 2022-09-08 – 2022-09-09 (×2): 200 mg via ORAL
  Filled 2022-09-07 (×2): qty 2

## 2022-09-07 MED ORDER — CEFAZOLIN SODIUM-DEXTROSE 2-4 GM/100ML-% IV SOLN
2.0000 g | Freq: Three times a day (TID) | INTRAVENOUS | Status: AC
  Administered 2022-09-07 – 2022-09-09 (×6): 2 g via INTRAVENOUS
  Filled 2022-09-07 (×5): qty 100

## 2022-09-07 MED ORDER — NITROGLYCERIN IN D5W 200-5 MCG/ML-% IV SOLN: 0.0000 ug/min | INTRAVENOUS | Status: DC

## 2022-09-07 MED ORDER — PANTOPRAZOLE SODIUM 40 MG PO TBEC
40.0000 mg | DELAYED_RELEASE_TABLET | Freq: Every day | ORAL | Status: DC
  Administered 2022-09-09 – 2022-09-11 (×3): 40 mg via ORAL
  Filled 2022-09-07 (×3): qty 1

## 2022-09-07 MED ORDER — PROTAMINE SULFATE 10 MG/ML IV SOLN
INTRAVENOUS | Status: AC
  Filled 2022-09-07: qty 10

## 2022-09-07 MED ORDER — ONDANSETRON HCL 4 MG/2ML IJ SOLN: 4.0000 mg | Freq: Four times a day (QID) | INTRAMUSCULAR | Status: DC | PRN

## 2022-09-07 MED ORDER — SODIUM CHLORIDE 0.9 % IV SOLN: INTRAVENOUS | Status: DC

## 2022-09-07 MED ORDER — SODIUM CHLORIDE 0.9% FLUSH: 3.0000 mL | INTRAVENOUS | Status: DC | PRN

## 2022-09-07 MED ORDER — DEXTROSE 50 % IV SOLN: 0.0000 mL | INTRAVENOUS | Status: DC | PRN

## 2022-09-07 SURGICAL SUPPLY — 101 items
ADAPTER CARDIO PERF ANTE/RETRO (ADAPTER) ×1 IMPLANT
APPLICATOR TIP COSEAL (VASCULAR PRODUCTS) IMPLANT
BAG DECANTER FOR FLEXI CONT (MISCELLANEOUS) ×1 IMPLANT
BLADE CLIPPER SURG (BLADE) ×1 IMPLANT
BLADE STERNUM SYSTEM 6 (BLADE) ×1 IMPLANT
BLADE SURG 15 STRL LF DISP TIS (BLADE) ×1 IMPLANT
BLADE SURG 15 STRL SS (BLADE) ×1
CANISTER SUCT 3000ML PPV (MISCELLANEOUS) ×1 IMPLANT
CANNULA AORTIC ROOT 9FR (CANNULA) IMPLANT
CANNULA ARTERIAL VENT 3/8 20FR (CANNULA) IMPLANT
CANNULA GUNDRY RCSP 15FR (MISCELLANEOUS) ×1 IMPLANT
CANNULA MC2 2 STG 29/37 NON-V (CANNULA) IMPLANT
CANNULA MC2 TWO STAGE (CANNULA) ×1
CANNULA SUMP PERICARDIAL (CANNULA) IMPLANT
CATH HEART VENT LEFT (CATHETERS) ×1 IMPLANT
CATH ROBINSON RED A/P 18FR (CATHETERS) ×3 IMPLANT
CATH THORACIC 36FR (CATHETERS) ×1 IMPLANT
CATH THORACIC 36FR RT ANG (CATHETERS) ×1 IMPLANT
CNTNR URN SCR LID CUP LEK RST (MISCELLANEOUS) ×1 IMPLANT
CONN DRAIN DUAL-IN SOMAVAC (TUBING) ×1
CONN Y 3/8X3/8X3/8  BEN (MISCELLANEOUS) ×1
CONN Y 3/8X3/8X3/8 BEN (MISCELLANEOUS) IMPLANT
CONNECTOR DRAIN DUAL-IN SOMAVC (TUBING) IMPLANT
CONT SPEC 4OZ CLIKSEAL STRL BL (MISCELLANEOUS) IMPLANT
CONT SPEC 4OZ STRL OR WHT (MISCELLANEOUS) ×1
CONTAINER PROTECT SURGISLUSH (MISCELLANEOUS) ×1 IMPLANT
DRAPE WARM FLUID 44X44 (DRAPES) IMPLANT
DRSG COVADERM 4X14 (GAUZE/BANDAGES/DRESSINGS) ×1 IMPLANT
ELECT CAUTERY BLADE 6.4 (BLADE) ×1 IMPLANT
ELECT REM PT RETURN 9FT ADLT (ELECTROSURGICAL) ×2
ELECTRODE REM PT RTRN 9FT ADLT (ELECTROSURGICAL) ×2 IMPLANT
FELT TEFLON 1X6 (MISCELLANEOUS) ×1 IMPLANT
GAUZE 4X4 16PLY ~~LOC~~+RFID DBL (SPONGE) ×1 IMPLANT
GAUZE SPONGE 4X4 12PLY STRL (GAUZE/BANDAGES/DRESSINGS) ×1 IMPLANT
GAUZE SPONGE 4X4 12PLY STRL LF (GAUZE/BANDAGES/DRESSINGS) IMPLANT
GLOVE BIO SURGEON STRL SZ 6 (GLOVE) IMPLANT
GLOVE BIO SURGEON STRL SZ 6.5 (GLOVE) IMPLANT
GLOVE BIO SURGEON STRL SZ7 (GLOVE) IMPLANT
GLOVE BIO SURGEON STRL SZ7.5 (GLOVE) IMPLANT
GLOVE SURG MICRO LTX SZ7 (GLOVE) ×2 IMPLANT
GOWN STRL REUS W/ TWL LRG LVL3 (GOWN DISPOSABLE) ×4 IMPLANT
GOWN STRL REUS W/ TWL XL LVL3 (GOWN DISPOSABLE) ×1 IMPLANT
GOWN STRL REUS W/TWL LRG LVL3 (GOWN DISPOSABLE) ×4
GOWN STRL REUS W/TWL XL LVL3 (GOWN DISPOSABLE) ×1
GRAFT HEMASHIELD 30X10 (Vascular Products) IMPLANT
HEMOSTAT POWDER SURGIFOAM 1G (HEMOSTASIS) ×3 IMPLANT
HEMOSTAT SURGICEL 2X14 (HEMOSTASIS) ×1 IMPLANT
KIT BASIN OR (CUSTOM PROCEDURE TRAY) ×1 IMPLANT
KIT CATH CPB BARTLE (MISCELLANEOUS) ×1 IMPLANT
KIT SUCTION CATH 14FR (SUCTIONS) ×1 IMPLANT
KIT TURNOVER KIT B (KITS) ×1 IMPLANT
LEAD PACING MYOCARDI (MISCELLANEOUS) IMPLANT
LINE VENT (MISCELLANEOUS) IMPLANT
NS IRRIG 1000ML POUR BTL (IV SOLUTION) ×5 IMPLANT
PACK E OPEN HEART (SUTURE) ×1 IMPLANT
PACK OPEN HEART (CUSTOM PROCEDURE TRAY) ×1 IMPLANT
PAD ARMBOARD 7.5X6 YLW CONV (MISCELLANEOUS) ×2 IMPLANT
POSITIONER HEAD DONUT 9IN (MISCELLANEOUS) ×1 IMPLANT
SEALANT SURG COSEAL 8ML (VASCULAR PRODUCTS) IMPLANT
SET MPS 3-ND DEL (MISCELLANEOUS) IMPLANT
SET VEIN GRAFT PERF (SET/KITS/TRAYS/PACK) IMPLANT
SPONGE T-LAP 18X18 ~~LOC~~+RFID (SPONGE) ×4 IMPLANT
SPONGE T-LAP 4X18 ~~LOC~~+RFID (SPONGE) ×1 IMPLANT
SUT BONE WAX W31G (SUTURE) ×1 IMPLANT
SUT EB EXC GRN/WHT 2-0 V-5 (SUTURE) ×2 IMPLANT
SUT ETHIBON EXCEL 2-0 V-5 (SUTURE) IMPLANT
SUT ETHIBOND 2 0 SH (SUTURE) ×5
SUT ETHIBOND 2 0 SH 36X2 (SUTURE) IMPLANT
SUT ETHIBOND V-5 VALVE (SUTURE) IMPLANT
SUT PROLENE 3 0 SH 1 (SUTURE) ×1 IMPLANT
SUT PROLENE 3 0 SH 48 (SUTURE) ×2 IMPLANT
SUT PROLENE 3 0 SH DA (SUTURE) IMPLANT
SUT PROLENE 4 0 RB 1 (SUTURE) ×7
SUT PROLENE 4-0 RB1 .5 CRCL 36 (SUTURE) ×4 IMPLANT
SUT PROLENE 5 0 C1 (SUTURE) IMPLANT
SUT SILK  1 MH (SUTURE) ×2
SUT SILK 1 MH (SUTURE) IMPLANT
SUT SILK 1 TIES 10X30 (SUTURE) IMPLANT
SUT SILK 2 0 SH CR/8 (SUTURE) IMPLANT
SUT SILK 2 0 TIES 10X30 (SUTURE) IMPLANT
SUT SILK 3 0 SH CR/8 (SUTURE) IMPLANT
SUT SILK 4 0 TIE 10X30 (SUTURE) IMPLANT
SUT STEEL 6MS V (SUTURE) IMPLANT
SUT STEEL STERNAL CCS#1 18IN (SUTURE) IMPLANT
SUT STEEL SZ 6 DBL 3X14 BALL (SUTURE) IMPLANT
SUT VIC AB 1 CTX 36 (SUTURE) ×3
SUT VIC AB 1 CTX36XBRD ANBCTR (SUTURE) ×2 IMPLANT
SUT VIC AB 2-0 CT1 27 (SUTURE)
SUT VIC AB 2-0 CT1 TAPERPNT 27 (SUTURE) IMPLANT
SUT VIC AB 2-0 CTX 36 (SUTURE) IMPLANT
SUT VIC AB 3-0 X1 27 (SUTURE) IMPLANT
SYSTEM SAHARA CHEST DRAIN ATS (WOUND CARE) ×1 IMPLANT
TAPE PAPER 2X10 WHT MICROPORE (GAUZE/BANDAGES/DRESSINGS) IMPLANT
TOWEL GREEN STERILE (TOWEL DISPOSABLE) ×1 IMPLANT
TOWEL GREEN STERILE FF (TOWEL DISPOSABLE) ×1 IMPLANT
TRAY FOLEY SLVR 14FR TEMP STAT (SET/KITS/TRAYS/PACK) ×1 IMPLANT
TUBE CONNECTING 12X1/4 (SUCTIONS) IMPLANT
UNDERPAD 30X36 HEAVY ABSORB (UNDERPADS AND DIAPERS) ×1 IMPLANT
VENT LEFT HEART 12002 (CATHETERS) ×1
WATER STERILE IRR 1000ML POUR (IV SOLUTION) ×2 IMPLANT
YANKAUER SUCT BULB TIP NO VENT (SUCTIONS) IMPLANT

## 2022-09-07 NOTE — Anesthesia Procedure Notes (Signed)
Procedure Name: Intubation Date/Time: 09/07/2022 7:52 AM  Performed by: Moshe Salisbury, CRNAPre-anesthesia Checklist: Patient identified, Emergency Drugs available, Suction available and Patient being monitored Patient Re-evaluated:Patient Re-evaluated prior to induction Oxygen Delivery Method: Circle System Utilized Preoxygenation: Pre-oxygenation with 100% oxygen Induction Type: IV induction Ventilation: Mask ventilation without difficulty Laryngoscope Size: Mac and 4 Grade View: Grade I Tube type: Oral Tube size: 8.0 mm Number of attempts: 1 Airway Equipment and Method: Stylet Placement Confirmation: ETT inserted through vocal cords under direct vision, positive ETCO2 and breath sounds checked- equal and bilateral Secured at: 21 cm Tube secured with: Tape Dental Injury: Teeth and Oropharynx as per pre-operative assessment

## 2022-09-07 NOTE — Progress Notes (Signed)
  Echocardiogram Echocardiogram Transesophageal has been performed.  Austin Houston 09/07/2022, 8:26 AM

## 2022-09-07 NOTE — Anesthesia Procedure Notes (Signed)
Central Venous Catheter Insertion Performed by: Albertha Ghee, MD, anesthesiologist Start/End2/26/2024 7:24 AM, 09/07/2022 7:28 AM Patient location: Pre-op. Preanesthetic checklist: patient identified, IV checked, site marked, risks and benefits discussed, surgical consent, monitors and equipment checked, pre-op evaluation, timeout performed and anesthesia consent Hand hygiene performed  and maximum sterile barriers used  PA cath was placed.Swan type:thermodilution Procedure performed without using ultrasound guided technique. Attempts: 1 Patient tolerated the procedure well with no immediate complications.

## 2022-09-07 NOTE — Anesthesia Procedure Notes (Signed)
Arterial Line Insertion Start/End2/26/2024 7:00 AM, 09/07/2022 7:10 AM Performed by: Moshe Salisbury, CRNA, CRNA  Patient location: Pre-op. Preanesthetic checklist: patient identified, IV checked, site marked, risks and benefits discussed, surgical consent, monitors and equipment checked, pre-op evaluation, timeout performed and anesthesia consent Lidocaine 1% used for infiltration and patient sedated Left, radial was placed Catheter size: 20 G Hand hygiene performed , maximum sterile barriers used  and Seldinger technique used  Attempts: 1 Procedure performed without using ultrasound guided technique. Following insertion, dressing applied and Biopatch. Post procedure assessment: normal  Patient tolerated the procedure well with no immediate complications.

## 2022-09-07 NOTE — Anesthesia Procedure Notes (Signed)
Central Venous Catheter Insertion Performed by: Albertha Ghee, MD, anesthesiologist Start/End2/26/2024 7:04 AM, 09/07/2022 7:23 AM Patient location: Pre-op. Preanesthetic checklist: patient identified, IV checked, site marked, risks and benefits discussed, surgical consent, monitors and equipment checked, pre-op evaluation, timeout performed and anesthesia consent Lidocaine 1% used for infiltration and patient sedated Hand hygiene performed  and maximum sterile barriers used  Catheter size: 8.5 Fr Sheath introducer Procedure performed using ultrasound guided technique. Ultrasound Notes:anatomy identified, needle tip was noted to be adjacent to the nerve/plexus identified, no ultrasound evidence of intravascular and/or intraneural injection and image(s) printed for medical record Attempts: 1 Following insertion, line sutured and dressing applied. Post procedure assessment: blood return through all ports, free fluid flow and no air  Patient tolerated the procedure well with no immediate complications.

## 2022-09-07 NOTE — Interval H&P Note (Signed)
History and Physical Interval Note:  09/07/2022 6:31 AM  Austin Houston  has presented today for surgery, with the diagnosis of TAA.  The various methods of treatment have been discussed with the patient and family. After consideration of risks, benefits and other options for treatment, the patient has consented to  Procedure(s) with comments: REPLACEMENT ASCENDING AORTA (N/A) - WITH CIRC ARREST TRANSESOPHAGEAL ECHOCARDIOGRAM (TEE) (N/A) as a surgical intervention.  The patient's history has been reviewed, patient examined, no change in status, stable for surgery.  I have reviewed the patient's chart and labs.  Questions were answered to the patient's satisfaction.     [name]

## 2022-09-07 NOTE — Transfer of Care (Signed)
Immediate Anesthesia Transfer of Care Note  Patient: Austin Houston  Procedure(s) Performed: REPLACEMENT ASCENDING AORTA TRANSESOPHAGEAL ECHOCARDIOGRAM (TEE)  Patient Location: ICU  Anesthesia Type:General  Level of Consciousness: unresponsive and Patient remains intubated per anesthesia plan  Airway & Oxygen Therapy: Patient remains intubated per anesthesia plan and Patient placed on Ventilator (see vital sign flow sheet for setting)  Post-op Assessment: Report given to RN and Post -op Vital signs reviewed and stable  Post vital signs: Reviewed and stable  Last Vitals:  Vitals Value Taken Time  BP    Temp    Pulse 80 09/07/22 1229  Resp 16 09/07/22 1229  SpO2 97 % 09/07/22 1229  Vitals shown include unvalidated device data.  Last Pain:  Vitals:   09/07/22 0616  TempSrc:   PainSc: 0-No pain         Complications: No notable events documented.

## 2022-09-07 NOTE — Procedures (Signed)
Extubation Procedure Note  Patient Details:   Name: Austin Houston DOB: 15-May-1961 MRN: [id number]   Airway Documentation:    Vent end date: 09/07/22 Vent end time: 1530   Evaluation  O2 sats: stable throughout Complications: No apparent complications Patient did tolerate procedure well. Bilateral Breath Sounds: Clear   Yes  Patient performed NIF-22 and FVC .870 with good effort. RT extubated pt to 4L [place]. Patient able to speak and did IS 1257m. No complications noted. JAlvera Singh2/26/2024, 3:37 PM

## 2022-09-07 NOTE — TOC Initial Note (Signed)
Transition of Care [facility]) - Initial/Assessment Note    Patient Details  Name: Austin Houston MRN: [id number] Date of Birth: 1961-06-06  Transition of Care [facility] CM/SW Contact:    Erenest Rasher, RN Phone Number: [phone] 09/07/2022, 3:59 PM  Clinical Narrative:                 Spoke to wife at bedside. Pt was independent PTA. Will continue to follow for dc needs.   Expected Discharge Plan: [place] Barriers to Discharge: Continued Medical Work up   Patient Goals and CMS Choice Patient states their goals for this hospitalization and ongoing recovery are:: wants to recover          Expected Discharge Plan and Services   Discharge Planning Services: CM Consult   Living arrangements for the past 2 months: Single Family Home                                      Prior Living Arrangements/Services Living arrangements for the past 2 months: Single Family Home Lives with:: Spouse Patient language and need for interpreter reviewed:: Yes        Need for Family Participation in Patient Care: No (Comment) Care giver support system in place?: Yes (comment)   Criminal Activity/Legal Involvement Pertinent to Current Situation/Hospitalization: No - Comment as needed  Activities of Daily Living      Permission Sought/Granted Permission sought to share information with : Case Manager, Family Supports Permission granted to share information with : Yes, Verbal Permission Granted  Share Information with NAME: Davian Agredano     Permission granted to share info w Relationship: wife  Permission granted to share info w Contact Information: [phone]  Emotional Assessment Appearance:: Appears stated age Attitude/Demeanor/Rapport: Intubated (Following Commands or Not Following Commands)       Psych Involvement: No (comment)  Admission diagnosis:  S/P ascending aortic aneurysm repair [id number], Z86.79] Patient Active Problem List   Diagnosis  Date Noted   S/P ascending aortic aneurysm repair 09/07/2022   Aneurysm of ascending aorta without rupture ([place]) 07/20/2022   Mild persistent asthma without complication [id number]   Prostate cancer ([place]) 01/08/2016   Family history of prostate cancer in father 06/19/2014   Varicose veins of lower extremities with other complications [id number]   Former smokeless tobacco use [id number]   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 [place] [address] Phone: [phone] Fax: [place] [place] [address] Phone: [phone] Fax: [phone]     Social Determinants of Health (SDOH) Social History: SDOH Screenings   Depression (PHQ2-9): Low Risk  (12/12/2020)  Tobacco Use: Medium Risk (09/07/2022)   SDOH Interventions:     Readmission Risk Interventions     No data to display

## 2022-09-07 NOTE — Hospital Course (Addendum)
    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  He had some dilation of the ascending aorta dating back to 2004 on chest CT when it was measured at 3.8 x 4.4 cm.  The descending aorta at that time is 3 cm.  That scan also showed a nodule in the left lobe of the thyroid that was subsequently removed and found to be follicular thyroid cancer.  He had a follow-up CT scan of the chest in 2005 which showed the ascending aorta to measure 3.8 x 4.3 cm.  He did not have another scan until June 2018 when a high-resolution CT scan of the chest was done to evaluate some shortness of breath.  This showed the ascending aorta to have a diameter of 4.7 cm.  He recently had a CT of the chest with contrast on 04/28/2022 which showed the ascending aortic aneurysm has enlarged further to 5.0 cm.  The descending aorta is about 2.9 cm.  He has a family history of his brother dying in 2002 from an ascending aortic dissection.  There is no known family history of aortic valve disease or connective tissue disorder although he mentions that when his brother died there was some question about whether he had Marfan syndrome although it has never been documented in him or any other family member.  No one in the family has any of the stigmata of Marfan syndrome.   He is married and lives with his wife.  He denies any chest pain or pressure.  He has had no back pain.  He denies any shortness of breath.  Dr. Cyndia Bent evaluated the patient and all relevant studies and recommended proceeding with resection and grafting of the thoracic aneurysm.  Hospital course:  Patient was admitted electively on 09/07/2022 and  was taken the operating room at which time he underwent repair of the ascending thoracic aneurysm.  He tolerated the procedure well and was taken to the surgical intensive care unit in stable condition.  Postoperative hospital course:  He was extubated using standard post cardiac surgical protocols without difficulty.  He has maintained stable hemodynamics in sinus rhythm.  On postoperative day #1 the Swan-Ganz and arterial lines were discontinued.  He does show some expected postoperative volume overload and has been given a course of diuretics.  He has responded well to this.  He does have an expected acute blood loss anemia.  Most recent H/H dated 2/28 is 9.7/28.  He has made excellent progress in terms of his cardiac rehab.  He ambulates multiple times a day.  Incision is healing well without evidence of infection.  He is tolerating diet.  Oxygen has been weaned and he maintains good saturations on room air.  He continues to maintain sinus rhythm.  His blood pressure is well-controlled on beta-blocker.  He has not been resumed on his ARB or calcium channel blocker but may require resumption in the future.  This will be reassessed in follow-up appointment.  Overall, at the time of discharge the patient is felt to be quite stable.

## 2022-09-07 NOTE — Brief Op Note (Signed)
09/07/2022  12:42 PM  PATIENT:  Austin Houston  62 y.o. male  PRE-OPERATIVE DIAGNOSIS:  TAA  POST-OPERATIVE DIAGNOSIS:  TAA  PROCEDURE:  Procedure(s) with comments: REPLACEMENT ASCENDING AORTA (N/A) - WITH CIRC ARREST TRANSESOPHAGEAL ECHOCARDIOGRAM (TEE) (N/A)  SURGEON:  Surgeon(s) and Role:    * Bartle, Fernande Boyden, MD - Primary  PHYSICIAN ASSISTANT: Bertrum Helmstetter PA-C  ASSISTANTS: RNFA   ANESTHESIA:   general  EBL:  1500 mL   BLOOD ADMINISTERED:none  DRAINS:  MEDIASTINAL CHEST TUBES    LOCAL MEDICATIONS USED:  NONE  SPECIMEN:  Source of Specimen:  AORTIC ANEURYSM  DISPOSITION OF SPECIMEN:  PATHOLOGY  COUNTS:  YES  TOURNIQUET:  * No tourniquets in log *  DICTATION: .Dragon Dictation  PLAN OF CARE: Admit to inpatient   PATIENT DISPOSITION:  ICU - intubated and hemodynamically stable.   Delay start of Pharmacological VTE agent (>24hrs) due to surgical blood loss or risk of bleeding: yes  COMPLICATIONS: NO KNOWN

## 2022-09-07 NOTE — Op Note (Signed)
CARDIOVASCULAR SURGERY OPERATIVE NOTE  09/07/2022  Surgeon:  [name], MD  First Assistant: Jadene Pierini,  PA-C: An experienced assistant was required given the complexity of this surgery and the standard of surgical care. The assistant was needed for exposure, dissection, suctioning, retraction of delicate tissues and sutures, instrument exchange and for overall help during this procedure.    Preoperative Diagnosis:  Enlarging 5 cm ascending aortic aneurysm   Postoperative Diagnosis:  Same   Procedure:  Median Sternotomy Extracorporeal circulation 3.   Supra-coronary replacement of the ascending aorta (hemi-arch) using a 30 mm Hemashield graft under deep hypothermic circulatory arrest   Anesthesia:  General Endotracheal   Clinical History/Surgical Indication:   This 62 year old gentleman has a 5.0 cm fusiform ascending aortic aneurysm that appears to begin above the sinotubular junction and extend up to the proximal aortic arch just before the innominate artery where it returns normal size.  This has increased in size from 4.2-4.3 cm in 09-Oct-2002 to 4.7 cm in October 08, 2016 and now 5 cm in 10-08-21.  He does have a family history of aortic dissection in a brother who died from that in 10/08/2000.  2D echocardiogram shows a trileaflet aortic valve with no aortic insufficiency.  Cardiac catheterization showed no coronary obstructive disease.  I have recommended proceeding with surgical repair of his aneurysm given the risk factors of his brother dying from an aortic dissection and his aneurysm is currently increasing in size and 5 cm.  He is a relatively young healthy gentleman who should do well with surgical treatment.  I have reviewed the CTA images with him and his wife and answered their questions.  I think he will probably require supra-coronary ascending aortic replacement using a brief period of circulatory arrest. The aortic root does not appear aneurysmal.      Preparation:  The patient was seen  in the preoperative holding area and the correct patient, correct operation were confirmed with the patient after reviewing the medical record and catheterization. The consent was signed by me. Preoperative antibiotics were given. A pulmonary arterial line and radial arterial line were placed by the anesthesia team. The patient was taken back to the operating room and positioned supine on the operating room table. After being placed under general endotracheal anesthesia by the anesthesia team a foley catheter was placed. The neck, chest, abdomen, and both legs were prepped with betadine soap and solution and draped in the usual sterile manner. A surgical time-out was taken and the correct patient and operative procedure were confirmed with the nursing and anesthesia staff.  TEE:  Performed by Dr. Marcie Bal. This showed a trileaflet aortic valve with mild central AI ( pressure half time of 632 ms), normal LV systolic function and internal dimensions.   Cardiopulmonary Bypass:  A median sternotomy was performed. The pericardium was opened in the midline. Right ventricular function appeared normal. The ascending aorta was aneurysmal but returned to normal size just before the origin of the innominate artery. There were no contraindications to aortic cannulation or cross-clamping. The patient was fully systemically heparinized and the ACT was maintained > 400 sec. The distal ascending aorta was cannulated with a 20 F aortic cannula for arterial inflow. Venous cannulation was performed via the right atrial appendage using a two-staged venous cannula. Aortic graft occlusion was performed with a single cross-clamp.  A temperature probe was inserted into the interventricular septum and an insulating pad was placed in the pericardium. CO2 was insufflated into the pericardium throughout the  case to minimize intracardiac air.    Resection and grafting of ascending aortic aneurysm:  The patient was placed on  cardiopulmonary bypass and a left ventricular vent was placed via the right superior pulmonary vein. Systemic cooling was begun with a goal temperature of 18 degrees centigrade by bladder and rectal temperature probes. A retrograde cardioplegia cannula was placed through the right atrium into the coronary sinus without difficulty. A retrograde cerebral perfusion cannula was placed into the SVC through a pursestring suture and the SVC was encircled with a silastic tape.  After 20 minutes of cooling the target temperature of 20 degrees centigrade was reached. Cerebral oximetry was 70% bilaterally. BIS was zero. The patient was given Propofol and 125 mg of Solumedrol. The head was packed in ice. The bed was placed in steep trendelenburg. Circulatory arrest was begun and the blood volume emptied into the venous reservoir.  1400 cc of cold KBC retrograde cardioplegia was given and myocardial temperature dropped to 10 degrees centigrade. Complete diastolic arrest was maintained. After cardioplegia was complete continuous retrograde cerebral perfusion was begun and the SVC occluded with the silastic tape.The aortic cannula was removed. The aorta was transected just proximal to the innominate artery beveling the resection out along the undersurface of the aortic arch (Hemiarch replacement). The aortic diameter was measured at 30 mm here. A 30 x 10 mm Hemasheild Platinum vascular graft was prepared. ( Catalog # [id number] P0, Lot [id number], SN [id number]). It was anastomosed to the aortic arch in an end to end manner using 3-0 prolene continuous suture with a felt strip to reinforce the anastomisis. A light coating of CoSeal was applied to seal needle holes. The arterial end of the bypass circuit was then connected to the 39m side arm graft and circulation was slowly resumed. The tape was removed from the SVC. The aortic graft was cross-clamped proximal to the side arm graft and full CPB support was resumed. Circulatory  arrest time was 19 minutes. Retrograde cerebral perfusion time was 9 minutes.  The ascending aorta was mobilized from the right pulmonary artery and main PA. It was opened longitudinally and the valve inspected. It was a trileaflet valve with normal appearing leaflets. There did not appear to be any prolapse. There were no fenestrations. I suspect that the regurgitation may be due to annular dilation or the ascending aneurysm. Since it was mild I decided to leave the valve alone. The right and left coronary arteries were in normal position.  Then a segment of the 30 mm Hemashield graft was anastomosed end to end to the supra-coronary aorta using 3-0 Prolene continuous suture and a felt strip for reinforcement. The two grafts were then cut to the appropriate length and anastomosed end to end using continuous 3-0 prolene suture. CoSeal was applied to seal the needle holes in the grafts. A vent cannula was placed into the graft to remove any air. Deairing maneuvers were performed and the bed placed in trendelenburg position.   Completion:   The patient was rewarmed to 37 degrees Centigrade. A reanimation dose of warm retrograde cardioplegia was given. The crossclamp was removed with a time of 44 minutes. There was spontaneous return of ventricular fibrillation and the patient was defibrillated to sinus rhythm. The position of the grafts was satisfactory. Two temporary epicardial pacing wires were placed on the right atrium and a bipolar lead on the inferior wall. The patient was weaned from CPB without difficulty on no inotropic agents. CPB time was 103 minutes. Cardiac output  was 4 LPM. TEE showed unchanged mild central AI.  There was no MR. LV function appeared normal. Heparin was fully reversed with protamine and the venous cannula removed. The aortic sidearm graft was ligated with a #1 silk tie and divided. The stump was suture ligated with a 3-0 Prolene pledgetted horizontal mattress suture. Hemostasis was  achieved. Mediastinal drainage tubes were placed. The sternum was closed with double #6 stainless steel wires. The fascia was closed with continuous # 1 vicryl suture. The subcutaneous tissue was closed with 2-0 vicryl continuous suture. The skin was closed with 3-0 vicryl subcuticular suture. All sponge, needle, and instrument counts were reported correct at the end of the case. Dry sterile dressings were placed over the incisions and around the chest tubes which were connected to pleurevac suction. The patient was then transported to the surgical intensive care unit in stable condition.

## 2022-09-08 ENCOUNTER — Inpatient Hospital Stay (HOSPITAL_COMMUNITY): Payer: 59

## 2022-09-08 ENCOUNTER — Encounter (HOSPITAL_COMMUNITY): Payer: Self-pay | Admitting: Surgery

## 2022-09-08 LAB — GLUCOSE, CAPILLARY
Glucose-Capillary: 121 mg/dL — ABNORMAL HIGH (ref 70–99)
Glucose-Capillary: 128 mg/dL — ABNORMAL HIGH (ref 70–99)
Glucose-Capillary: 134 mg/dL — ABNORMAL HIGH (ref 70–99)
Glucose-Capillary: 110 mg/dL — ABNORMAL HIGH (ref 70–99)
Glucose-Capillary: 130 mg/dL — ABNORMAL HIGH (ref 70–99)
Glucose-Capillary: 133 mg/dL — ABNORMAL HIGH (ref 70–99)
Glucose-Capillary: 159 mg/dL — ABNORMAL HIGH (ref 70–99)
Glucose-Capillary: 159 mg/dL — ABNORMAL HIGH (ref 70–99)
Glucose-Capillary: 112 mg/dL — ABNORMAL HIGH (ref 70–99)
Glucose-Capillary: 114 mg/dL — ABNORMAL HIGH (ref 70–99)
Glucose-Capillary: 119 mg/dL — ABNORMAL HIGH (ref 70–99)

## 2022-09-08 LAB — BASIC METABOLIC PANEL
Anion gap: 9 (ref 5–15)
Anion gap: 12 (ref 5–15)
BUN: 12 mg/dL (ref 6–20)
BUN: 12 mg/dL (ref 6–20)
CO2: 23 mmol/L (ref 22–32)
CO2: 23 mmol/L (ref 22–32)
Calcium: 7.8 mg/dL — ABNORMAL LOW (ref 8.9–10.3)
Calcium: 8 mg/dL — ABNORMAL LOW (ref 8.9–10.3)
Chloride: 102 mmol/L (ref 98–111)
Chloride: 101 mmol/L (ref 98–111)
Creatinine, Ser: 0.87 mg/dL (ref 0.61–1.24)
Creatinine, Ser: 0.86 mg/dL (ref 0.61–1.24)
GFR, Estimated: >60 mL/min (ref >60)
GFR, Estimated: >60 mL/min (ref >60)
Glucose, Bld: 127 mg/dL — ABNORMAL HIGH (ref 70–99)
Glucose, Bld: 111 mg/dL — ABNORMAL HIGH (ref 70–99)
Potassium: 4.6 mmol/L (ref 3.5–5.1)
Potassium: 4.4 mmol/L (ref 3.5–5.1)
Sodium: 134 mmol/L — ABNORMAL LOW (ref 135–145)
Sodium: 136 mmol/L (ref 135–145)

## 2022-09-08 LAB — CBC
HCT: 30.3 % — ABNORMAL LOW (ref 39.0–52.0)
Hemoglobin: 10.8 g/dL — ABNORMAL LOW (ref 13.0–17.0)
MCH: 34.6 pg — ABNORMAL HIGH (ref 26.0–34.0)
MCHC: 35.6 g/dL (ref 30.0–36.0)
MCV: 97.1 fL (ref 80.0–100.0)
Platelets: 177 10*3/uL (ref 150–400)
RBC: 3.12 MIL/uL — ABNORMAL LOW (ref 4.22–5.81)
RDW: 11.9 % (ref 11.5–15.5)
WBC: 8.9 10*3/uL (ref 4.0–10.5)
nRBC: 0 % (ref 0.0–0.2)

## 2022-09-08 LAB — MAGNESIUM
Magnesium: 2.6 mg/dL — ABNORMAL HIGH (ref 1.7–2.4)
Magnesium: 2.4 mg/dL (ref 1.7–2.4)

## 2022-09-08 LAB — PREPARE CRYOPRECIPITATE
Unit division: 0
Unit division: 0

## 2022-09-08 LAB — BPAM CRYOPRECIPITATE
Blood Product Expiration Date: 202402272359
Blood Product Expiration Date: 202403022359
ISSUE DATE / TIME: 202402261200
ISSUE DATE / TIME: 202402261219
Unit Type and Rh: 5100
Unit Type and Rh: 6200

## 2022-09-08 LAB — SURGICAL PATHOLOGY

## 2022-09-08 MED ORDER — ASPIRIN 81 MG PO CHEW
81.0000 mg | CHEWABLE_TABLET | Freq: Every day | ORAL | Status: DC
  Administered 2022-09-09: 81 mg
  Filled 2022-09-08: qty 1

## 2022-09-08 MED ORDER — INSULIN ASPART 100 UNIT/ML IJ SOLN: 0.0000 [IU] | INTRAMUSCULAR | Status: DC

## 2022-09-08 MED ORDER — FUROSEMIDE 10 MG/ML IJ SOLN
40.0000 mg | Freq: Two times a day (BID) | INTRAMUSCULAR | Status: AC
  Administered 2022-09-08 (×2): 40 mg via INTRAVENOUS
  Filled 2022-09-08 (×2): qty 4

## 2022-09-08 MED ORDER — METOCLOPRAMIDE HCL 5 MG/ML IJ SOLN
10.0000 mg | Freq: Four times a day (QID) | INTRAMUSCULAR | Status: AC
  Administered 2022-09-08 – 2022-09-09 (×4): 10 mg via INTRAVENOUS
  Filled 2022-09-08 (×4): qty 2

## 2022-09-08 MED ORDER — KETOROLAC TROMETHAMINE 15 MG/ML IJ SOLN
15.0000 mg | Freq: Four times a day (QID) | INTRAMUSCULAR | Status: DC | PRN
  Administered 2022-09-08 – 2022-09-11 (×6): 15 mg via INTRAVENOUS
  Filled 2022-09-08 (×6): qty 1

## 2022-09-08 MED ORDER — ASPIRIN 81 MG PO TBEC
81.0000 mg | DELAYED_RELEASE_TABLET | Freq: Every day | ORAL | Status: DC
  Administered 2022-09-08 – 2022-09-10 (×2): 81 mg via ORAL
  Filled 2022-09-08 (×3): qty 1

## 2022-09-08 MED ORDER — ENOXAPARIN SODIUM 40 MG/0.4ML IJ SOSY
40.0000 mg | PREFILLED_SYRINGE | Freq: Every day | INTRAMUSCULAR | Status: DC
  Administered 2022-09-08 – 2022-09-10 (×3): 40 mg via SUBCUTANEOUS
  Filled 2022-09-08 (×3): qty 0.4

## 2022-09-08 MED ORDER — LORATADINE 10 MG PO TABS
10.0000 mg | ORAL_TABLET | Freq: Every day | ORAL | Status: DC
  Administered 2022-09-08 – 2022-09-11 (×4): 10 mg via ORAL
  Filled 2022-09-08 (×4): qty 1

## 2022-09-08 NOTE — Progress Notes (Signed)
[facility] Progress Note: 1 Day Post-Op Procedure(s) (LRB): REPLACEMENT ASCENDING AORTA (N/A) TRANSESOPHAGEAL ECHOCARDIOGRAM (TEE) (N/A)  LOS: 1 day   Doing well in sinus Sitting up, conversational.      Latest Ref Rng & Units 09/08/2022    4:50 AM 09/07/2022    7:02 PM 09/07/2022    5:08 PM  CBC  WBC 4.0 - 10.5 K/uL 8.9  8.4    Hemoglobin 13.0 - 17.0 g/dL 10.8  11.1  10.9   Hematocrit 39.0 - 52.0 % 30.3  32.6  32.0   Platelets 150 - 400 K/uL 177  201         Latest Ref Rng & Units 09/08/2022    4:50 AM 09/08/2022   12:37 AM 09/07/2022    5:08 PM  CMP  Glucose 70 - 99 mg/dL 111  127    BUN 6 - 20 mg/dL 12  12    Creatinine 0.61 - 1.24 mg/dL 0.86  0.87    Sodium 135 - 145 mmol/L 136  134  136   Potassium 3.5 - 5.1 mmol/L 4.4  4.6  5.1   Chloride 98 - 111 mmol/L 101  102    CO2 22 - 32 mmol/L 23  23    Calcium 8.9 - 10.3 mg/dL 8.0  7.8      ABG    Component Value Date/Time   PHART 7.361 09/07/2022 1708   PCO2ART 41.5 09/07/2022 1708   PO2ART 91 09/07/2022 1708   HCO3 23.5 09/07/2022 1708   TCO2 25 09/07/2022 1708   ACIDBASEDEF 2.0 09/07/2022 1708   O2SAT 97 09/07/2022 1708

## 2022-09-08 NOTE — Progress Notes (Signed)
T[facility] Progress Note: 1 Day Post-Op Procedure(s) (LRB): REPLACEMENT ASCENDING AORTA (N/A) TRANSESOPHAGEAL ECHOCARDIOGRAM (TEE) (N/A)  LOS: 1 day    Doing well postop CT Dry  Good uop AAI now extubated Came out of OR AV paced Apparently issues with V wires I tested V, it did capture about 75% of the time.  BP best in AAI as expected, left there.       Latest Ref Rng & Units 09/07/2022    7:02 PM 09/07/2022    5:08 PM 09/07/2022    3:20 PM  CBC  WBC 4.0 - 10.5 K/uL 8.4     Hemoglobin 13.0 - 17.0 g/dL 11.1  10.9  11.6   Hematocrit 39.0 - 52.0 % 32.6  32.0  34.0   Platelets 150 - 400 K/uL 201          Latest Ref Rng & Units 09/07/2022    5:08 PM 09/07/2022    3:20 PM 09/07/2022   12:52 PM  CMP  Sodium 135 - 145 mmol/L 136  137  137   Potassium 3.5 - 5.1 mmol/L 5.1  4.7  4.9     ABG    Component Value Date/Time   PHART 7.361 09/07/2022 1708   PCO2ART 41.5 09/07/2022 1708   PO2ART 91 09/07/2022 1708   HCO3 23.5 09/07/2022 1708   TCO2 25 09/07/2022 1708   ACIDBASEDEF 2.0 09/07/2022 1708   O2SAT 97 09/07/2022 1708    Vent Mode: SIMV;PRVC;PSV FiO2 (%):  [40 %-50 %] 40 % Set Rate:  [4 bmp-16 bmp] 4 bmp Vt Set:  [630 mL] 630 mL PEEP:  [5 cmH20] 5 cmH20 Pressure Support:  [10 cmH20] 10 cmH20 Plateau Pressure:  [14 cmH20-17 cmH20] 14 cmH20

## 2022-09-08 NOTE — Progress Notes (Signed)
1 Day Post-Op Procedure(s) (LRB): REPLACEMENT ASCENDING AORTA (N/A) TRANSESOPHAGEAL ECHOCARDIOGRAM (TEE) (N/A) Subjective:  Some nausea. Pain in shoulders mostly. Hx of recent rotator cuff injury.  Objective: Vital signs in last 24 hours: Temp:  [95.5 F (35.3 C)-99 F (37.2 C)] 98.6 F (37 C) (02/27 0600) Pulse Rate:  [46-82] 80 (02/27 0600) Cardiac Rhythm: Atrial paced (02/26 1700) Resp:  [11-26] 21 (02/27 0600) BP: (91-125)/(62-81) 121/72 (02/27 0600) SpO2:  [87 %-100 %] 96 % (02/27 0600) Arterial Line BP: (93-154)/(56-83) 154/68 (02/27 0600) FiO2 (%):  [40 %-50 %] 40 % (02/26 1440) Weight:  [97.5 kg] 97.5 kg (02/27 0600)  Hemodynamic parameters for last 24 hours: PAP: (14-43)/(2-25) 26/13 CO:  [3.5 L/min-7.9 L/min] 7.9 L/min CI:  [1.6 L/min/m2-3.6 L/min/m2] 3.6 L/min/m2  Intake/Output from previous day: 02/26 0701 - 02/27 0700 In: 5720.8 [I.V.:2827.9; Blood:1052; IV Piggyback:1840.9] Out: [id number] [Urine:3525; Blood:1500; Chest Tube:430] Intake/Output this shift: Total I/O In: 603.9 [I.V.:394; IV Piggyback:209.9] Out: 1360 [Urine:1150; Chest Tube:210]  General appearance: alert and cooperative Neurologic: intact Heart: regular rate and rhythm, S1, S2 normal, no murmur Lungs: clear to auscultation bilaterally Extremities: edema mild Wound: dressing dry  Lab Results: Recent Labs    09/07/22 1902 09/08/22 0450  WBC 8.4 8.9  HGB 11.1* 10.8*  HCT 32.6* 30.3*  PLT 201 177   BMET:  Recent Labs    09/08/22 0037 09/08/22 0450  NA 134* 136  K 4.6 4.4  CL 102 101  CO2 23 23  GLUCOSE 127* 111*  BUN 12 12  CREATININE 0.87 0.86  CALCIUM 7.8* 8.0*    PT/INR:  Recent Labs    09/07/22 1251  LABPROT 16.4*  INR 1.3*   ABG    Component Value Date/Time   PHART 7.361 09/07/2022 1708   HCO3 23.5 09/07/2022 1708   TCO2 25 09/07/2022 1708   ACIDBASEDEF 2.0 09/07/2022 1708   O2SAT 97 09/07/2022 1708   CBG (last 3)  Recent Labs    09/08/22 0205  09/08/22 0303 09/08/22 0458  GLUCAP 128* 134* 110*   CXR: ok  ECG:   Assessment/Plan: S/P Procedure(s) (LRB): REPLACEMENT ASCENDING AORTA (N/A) TRANSESOPHAGEAL ECHOCARDIOGRAM (TEE) (N/A)  POD 1 Hemodynamically stable in sinus rhythm. Continue low dose Lopressor as HR allows.  DC swan, arterial line.  Volume excess: 4 lbs over preop. Start diuresis.  DC insulin drip. Will check CBG's for a while today but should be able to stop that later.  Keep chest tubes in.  IS, OOB.  Add Toradol for pain.   LOS: 1 day    [name] 09/08/2022

## 2022-09-08 NOTE — Discharge Summary (Signed)
Physician Discharge Summary       [address]       [place] [address]             [phone]    Patient ID: Austin Houston MRN: [id number] DOB/AGE: April 12, 1961 62 y.o.  Admit date: 09/07/2022 Discharge date: 09/11/2022  Admission Diagnoses: Ascending thoracic aortic aneurysm  Discharge Diagnoses:  Principal Problem:   S/P ascending aortic aneurysm repair   Patient Active Problem List   Diagnosis Date Noted   S/P ascending aortic aneurysm repair 09/07/2022   Aneurysm of ascending aorta without rupture ([facility]) 07/20/2022   Mild persistent asthma without complication [id number]   Prostate cancer ([facility]) 01/08/2016   Family history of prostate cancer in father 06/19/2014   Varicose veins of lower extremities with other complications [id number]   Former smokeless tobacco use [id number]   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  He had some dilation of the ascending aorta dating back to 2004 on chest CT when it was measured at 3.8 x 4.4 cm.  The descending aorta at that time is 3 cm.  That scan also showed a nodule in the left lobe of the thyroid that was subsequently removed and found to be follicular thyroid cancer.  He had a follow-up CT scan of the chest in 2005 which showed the ascending aorta to measure 3.8 x 4.3 cm.  He did not have another scan until June 2018 when a high-resolution CT scan of the chest was done to evaluate some shortness of breath.  This showed the ascending aorta to have a diameter of 4.7 cm.  He recently had a CT of the chest with contrast on 04/28/2022 which showed the ascending aortic aneurysm has enlarged further to 5.0 cm.  The descending aorta is about 2.9 cm.  He has a family history of his brother dying in 2002 from an ascending aortic dissection.  There is no known family history of aortic valve disease or connective tissue disorder although he mentions that when his brother died there was some question about whether he had Marfan syndrome although it has never been documented in him or any other family member.  No one in the family has any of the stigmata of Marfan syndrome.   He is married and lives with his wife.  He denies any chest pain or pressure.  He has had no back pain.  He denies any shortness of breath.  Dr. Cyndia Bent evaluated the patient and all relevant studies and recommended proceeding with resection and grafting of the thoracic aneurysm.  Hospital course:  Patient was admitted electively on 09/07/2022 and was taken the operating room at which time he underwent repair of the ascending thoracic aneurysm.  He tolerated the procedure well and was taken to the surgical intensive care unit in stable condition.  Postoperative hospital course:  He was extubated using standard post cardiac surgical protocols without difficulty.  He has maintained stable hemodynamics in sinus  rhythm.  On postoperative day #1 the Swan-Ganz and arterial lines were discontinued.  He does show some expected postoperative volume overload and has been given a course of diuretics.  He has responded well to this.  He does have an expected acute blood loss anemia.  Most recent H/H dated 2/28 is 9.7/28.  He has made excellent progress in terms of his cardiac rehab.  He ambulates multiple times a day.  Incision is healing well without evidence of infection.  He is tolerating diet.  Oxygen has been weaned and he maintains good saturations on room air.  He continues to maintain sinus rhythm.  His blood pressure is well-controlled on beta-blocker.  He has not been resumed on his ARB or calcium channel blocker but may require resumption in the future.  This will be reassessed in follow-up appointment.  Overall, at the time of discharge the patient is felt to be quite stable.    Latest Vital Signs: Blood pressure 118/73, pulse 73, temperature 97.9 F (36.6 C), temperature source Oral, resp. rate 18, height '6\' 1"'$  (1.854 m), weight 94 kg, SpO2 100 %.  Physical Exam: General appearance: alert and cooperative Neurologic: intact Heart: regular rate and rhythm, S1, S2 normal, no murmur Lungs: clear to auscultation bilaterally Extremities: no edema Wound: incision healing well. Discharge Condition:good  Recent laboratory studies:  Lab Results  Component Value Date   WBC 7.0 09/09/2022   HGB 9.7 (L) 09/09/2022   HCT 28.0 (L) 09/09/2022   MCV 98.2 09/09/2022   PLT 140 (L) 09/09/2022   Lab Results  Component Value Date   NA 135 09/09/2022   K 3.9 09/09/2022   CL 100 09/09/2022   CO2 26 09/09/2022   CREATININE 1.08 09/09/2022   GLUCOSE 112 (H) 09/09/2022      Diagnostic Studies: DG Chest 2 View  Result Date: 09/11/2022 CLINICAL DATA:  Chest tube removal EXAM: CHEST - 2 VIEW COMPARISON:  Chest radiograph dated 09/09/2022 FINDINGS: Interval removal of right IJ catheter and mediastinal drain.  Bibasilar patchy opacities, likely atelectasis. No focal consolidations. No pneumothorax. Minimal blunting of the bilateral costophrenic angles. Similar cardiomediastinal silhouette. Median sternotomy wires are nondisplaced. IMPRESSION: 1. Interval removal of right IJ catheter and mediastinal drain. No pneumothorax. 2. Possible trace bilateral pleural effusions. Electronically Signed   By: Darrin Nipper M.D.   On: 09/11/2022 08:15   ECHO INTRAOPERATIVE TEE  Result Date: 09/10/2022  *INTRAOPERATIVE TRANSESOPHAGEAL REPORT *  Patient Name:   Austin Houston Date of Exam: 09/07/2022 Medical Rec #:  [id number]        Height:       73.0 in Accession #:    [id number]       Weight:       211.0 lb Date of Birth:  04-17-1961       BSA:          2.20 m Patient Age:    42 years  BP:           99/67 mmHg Patient Gender: M                HR:           55 bpm. Exam Location:  Anesthesiology Transesophogeal exam was perform intraoperatively during surgical procedure. Patient was closely monitored under general anesthesia during the entirety of examination. Indications:     I71.2 Ascending aortic aneurysm Sonographer:     Roseanna Rainbow RDCS Performing Phys: [id number] [name] Diagnosing Phys: Albertha Ghee MD Complications: No known complications during this procedure. POST-OP IMPRESSIONS _ Left Ventricle: The left ventricle is unchanged from pre-bypass. _ Aorta: A graft was placed in the ascending aorta for repair. _ Aortic Valve: The aortic valve appears unchanged from pre-bypass. PRE-OP FINDINGS  Left Ventricle: The left ventricle has normal systolic function, with an ejection fraction of 60-65%. The cavity size was normal. There is no increase in left ventricular wall thickness. Right Ventricle: The right ventricle has normal systolic function. The cavity was normal. There is no increase in right ventricular wall thickness. Left Atrium: Left atrial size was normal in size. No left atrial/left atrial appendage thrombus was  detected. Right Atrium: Right atrial size was normal in size. Interatrial Septum: Evidence of atrial level shunting detected by color flow Doppler. A small patent foramen ovale is detected with predominantly left to right shunting across the atrial septum. Pericardium: There is no evidence of pericardial effusion. Mitral Valve: The mitral valve is normal in structure. Mitral valve regurgitation is trivial by color flow Doppler. There is No evidence of mitral stenosis. Tricuspid Valve: The tricuspid valve was normal in structure. Tricuspid valve regurgitation was not visualized by color flow Doppler. No evidence of tricuspid stenosis is present. Aortic Valve: The aortic valve is tricuspid Aortic valve regurgitation is mild by color flow Doppler. The jet is anteriorly-directed. AI vena contracta 3.77 mm. PHT 632 ms. There is no stenosis of the aortic valve. Pulmonic Valve: The pulmonic valve was normal in structure, with normal. Pulmonic valve regurgitation is not visualized by color flow Doppler. Aorta: The is normal in size and structure. The aortic arch was not well visualized. There is severe dilatation of the aortic root and of the ascending aorta, measuring 47 mm. +--------------+--------++ LEFT VENTRICLE         +--------------+--------++ PLAX 2D                +--------------+--------++ LVOT diam:    2.70 cm  +--------------+--------++ LVOT Area:    5.73 cm +--------------+--------++                        +--------------+--------++ +-------------+-----------++ AORTIC VALVE             +-------------+-----------++ AV Vmax:     166.00 cm/s +-------------+-----------++ AV Vmean:    97.300 cm/s +-------------+-----------++ AV VTI:      0.323 m     +-------------+-----------++ AV Peak Grad:11.0 mmHg   +-------------+-----------++ AV Mean Grad:5.0 mmHg    +-------------+-----------++ AR PHT:      632 msec    +-------------+-----------++  +------------+-------++  AORTA               +------------+-------++ Ao Asc diam:4.60 cm +------------+-------++  +--------------+-------+ SHUNTS                +--------------+-------+ Systemic Diam:2.70 cm +--------------+-------+  Albertha Ghee MD Electronically signed by Albertha Ghee MD Signature Date/Time: 09/10/2022/11:26:19  AM    Final    DG Chest Port 1 View  Result Date: 09/09/2022 CLINICAL DATA:  Status post aortic aneurysm repair. EXAM: PORTABLE CHEST 1 VIEW COMPARISON:  September 08, 2022. FINDINGS: The heart size and mediastinal contours are within normal limits. Sternotomy wires are noted. Right internal jugular Swan-Ganz catheter is noted. Both lungs are clear. The visualized skeletal structures are unremarkable. IMPRESSION: No active disease. Electronically Signed   By: Marijo Conception M.D.   On: 09/09/2022 08:16   DG Chest Port 1 View  Result Date: 09/08/2022 CLINICAL DATA:  Ascending aortic aneurysm repair EXAM: PORTABLE CHEST 1 VIEW COMPARISON:  None Available. FINDINGS: Midline sternotomy. Interval extubation and removal of NG tube. Swan-Ganz catheter the RIGHT main pulmonary artery. Mediastinal drain in place. Mild LEFT basilar atelectasis. No pneumothorax. IMPRESSION: 1. Extubation without complication. 2. Stable support apparatus. Electronically Signed   By: Suzy Bouchard M.D.   On: 09/08/2022 08:30   DG Chest Port 1 View  Result Date: 09/07/2022 CLINICAL DATA:  Status post ascending aortic aneurysm repair EXAM: PORTABLE CHEST 1 VIEW COMPARISON:  CXR 09/03/22 FINDINGS: Status post median sternotomy for ascending aortic aneurysm repair. Endotracheal tube terminates approximately 5 cm the carina. Right-sided PA catheter likely terminates in the left lower lobe lobar or left lower lobe segmental pulmonary artery. Mediastinal drain in place. Enteric tube courses below diaphragm with the tip out of the field of view and side hole projecting over the expected location of the stomach. Compared to  prior exam there is increased prominence of the mediastinal contour, likely postsurgical. Prominent bilateral perihilar pulmonary opacities, possibly suggestive of mild pulmonary edema or venous congestion. No focal airspace opacity. No pleural effusion. No pneumothorax. Visualized upper abdomen is unremarkable. IMPRESSION: 1. Status post median sternotomy for ascending aortic aneurysm repair. Lines and tubes as described above with the PA catheter terminating in a left lower lobe lobar or segmental pulmonary artery. 2. Prominent bilateral perihilar pulmonary opacities, possibly suggestive of mild pulmonary edema or venous congestion. 3. No pneumothorax or pleural effusion. Electronically Signed   By: Marin Roberts M.D.   On: 09/07/2022 13:05   EP STUDY  Result Date: 09/07/2022 See surgical note for result.  DG Chest 2 View  Result Date: 09/05/2022 CLINICAL DATA:  Preop.  Aneurysm of the ascending aorta EXAM: CHEST - 2 VIEW COMPARISON:  Chest x-ray 08/25/2021.  CT chest 04/28/2022 FINDINGS: Tortuous and ectatic aorta. Normal cardiopericardial silhouette. No consolidation, pneumothorax or effusion. No edema. Degenerative changes of the spine. IMPRESSION: No acute cardiopulmonary disease Electronically Signed   By: Jill Side M.D.   On: 09/05/2022 11:35   MR SHOULDER RIGHT WO CONTRAST  Result Date: 08/12/2022 CLINICAL DATA:  Right shoulder pain with limited range of motion since falling approximately 2 months ago. No previous relevant surgery. EXAM: MRI OF THE RIGHT SHOULDER WITHOUT CONTRAST TECHNIQUE: Multiplanar, multisequence MR imaging of the shoulder was performed. No intravenous contrast was administered. COMPARISON:  Radiographs 06/16/2022. FINDINGS: Rotator cuff: Mild supraspinatus and infraspinatus tendinosis. There is a moderate size full-thickness insertional tear of the anterior leading edge of the supraspinatus tendon. This is associated with 11 mm of tendon retraction and measures  approximately 13 mm AP on sagittal image 22/7. The additional components of the rotator cuff are intact. Muscles:  No focal muscular atrophy or edema. Biceps long head:  Intact and normally positioned. Acromioclavicular Joint: The acromion is type 2/3. There are moderate acromioclavicular degenerative changes. A small amount of fluid is  present in the subacromial-subdeltoid bursa. Glenohumeral Joint: No significant shoulder joint effusion or glenohumeral arthropathy. Labrum: Labral assessment limited by the lack of joint fluid. No evidence of labral tear or paralabral cyst. Bones: No acute or significant extra-articular osseous findings. Other: No significant soft tissue findings. IMPRESSION: 1. Moderate-sized full-thickness insertional tear of the anterior leading edge of the supraspinatus tendon. No focal muscular atrophy. 2. The additional components of the rotator cuff, biceps tendon and labrum appear intact. 3. Moderate acromioclavicular degenerative changes. Electronically Signed   By: Richardean Sale M.D.   On: 08/12/2022 17:08       Discharge Instructions     Amb Referral to Cardiac Rehabilitation   Complete by: As directed    Diagnosis: Other   After initial evaluation and assessments completed: Virtual Based Care may be provided alone or in conjunction with Phase 2 Cardiac Rehab based on patient barriers.: Yes   Intensive Cardiac Rehabilitation (ICR) [place] location only OR Traditional Cardiac Rehabilitation (TCR) *If criteria for ICR are not met will enroll in TCR [facility] only): Yes   Discharge patient   Complete by: As directed    Discharge disposition: 01-Home or Self Care   Discharge patient date: 09/11/2022       Discharge Medications: Allergies as of 09/11/2022       Reactions   Vioxx [rofecoxib] Anaphylaxis   Tolerates aspirin and naproxen.   Celebrex [celecoxib] Rash   Tolerates aspirin and naproxen.   Codeine Rash   Patient reports this happened as a child. He has tolerated  Codeine since that time   Doxycycline Rash        Medication List     STOP taking these medications    amLODipine 10 MG tablet Commonly known as: NORVASC   cyclobenzaprine 10 MG tablet Commonly known as: FLEXERIL   fluorouracil 5 % cream Commonly known as: EFUDEX   losartan 100 MG tablet Commonly known as: COZAAR   nystatin 100000 UNIT/ML suspension Commonly known as: MYCOSTATIN   tadalafil 5 MG tablet Commonly known as: CIALIS       TAKE these medications    acetaminophen 325 MG tablet Commonly known as: Tylenol Take 2 tablets (650 mg total) by mouth every 6 (six) hours as needed. What changed:  when to take this reasons to take this   albuterol 108 (90 Base) MCG/ACT inhaler Commonly known as: VENTOLIN HFA INHALE 2 PUFFS INTO THE LUNGS EVERY 6 (SIX) HOURS AS NEEDED FOR WHEEZING. What changed:  how much to take how to take this when to take this reasons to take this   ALPRAZolam 0.25 MG tablet Commonly known as: Xanax Take 1 tablet (0.25 mg total) by mouth 2 (two) times daily as needed for anxiety or sleep. What changed: when to take this   aspirin EC 81 MG tablet Take 81 mg by mouth daily. Swallow whole.   cetirizine 10 MG tablet Commonly known as: ZYRTEC Take 10 mg by mouth as needed for allergies.   diclofenac Sodium 1 % Gel Commonly known as: VOLTAREN Apply 1 a small amount to affected area twice a day What changed:  how much to take when to take this   fluticasone 50 MCG/ACT nasal spray Commonly known as: FLONASE Place 2 sprays into both nostrils daily. What changed:  how much to take when to take this reasons to take this   ibuprofen 200 MG tablet Commonly known as: ADVIL Take 400 mg by mouth as needed for moderate pain.   LUBRICATING  EYE DROPS OP Apply 2 drops to eye daily as needed (dry eyes).   magnesium oxide 400 (240 Mg) MG tablet Commonly known as: MAG-OX Take 400 mg by mouth daily.   metoprolol tartrate 25 MG  tablet Commonly known as: LOPRESSOR Take 1 tablet (25 mg total) by mouth 2 (two) times daily.   montelukast 10 MG tablet Commonly known as: SINGULAIR Take 1 tablet (10 mg total) by mouth at bedtime.   multivitamin with minerals Tabs tablet Take 1 tablet by mouth daily.   omeprazole 40 MG capsule Commonly known as: PRILOSEC Take 1 capsule (40 mg total) by mouth daily.   ondansetron 4 MG tablet Commonly known as: Zofran Take 1 tablet (4 mg total) by mouth every 8 (eight) hours as needed for nausea or vomiting. What changed: when to take this   oxyCODONE 5 MG immediate release tablet Commonly known as: Oxy IR/ROXICODONE Take 1 tablet (5 mg total) by mouth every 6 (six) hours as needed for up to 7 days for severe pain.   PRESCRIPTION MEDICATION Apply 1 Application topically as needed (rash on legs). Clobetasol Cream   promethazine 25 MG suppository Commonly known as: PHENERGAN Place 1 suppository (25 mg total) rectally every 6 (six) hours as needed for nausea or vomiting. What changed: when to take this   Symbicort 160-4.5 MCG/ACT inhaler Generic drug: budesonide-formoterol Inhale 2 puffs into the lungs in the morning and at bedtime.        Follow Up Appointments:  Follow-up Information     [name], MD Follow up.   Specialty: Cardiothoracic Surgery Why: Please see discharge paperwork for details of follow-up appointment with surgeon. Contact information: [address] [place] [address] [phone]         [facility] Follow up.   Why: On the date you see Dr. Cyndia Bent please obtain a chest x-ray at [facility] 1 hour prior to seeing the surgeon. Contact information: [place]                Signed: Gaspar Bidding 09/11/2022, 8:22 AM

## 2022-09-09 ENCOUNTER — Inpatient Hospital Stay (HOSPITAL_COMMUNITY): Payer: 59

## 2022-09-09 LAB — BASIC METABOLIC PANEL
Anion gap: 8 (ref 5–15)
Anion gap: 9 (ref 5–15)
BUN: 15 mg/dL (ref 6–20)
BUN: 18 mg/dL (ref 6–20)
CO2: 26 mmol/L (ref 22–32)
CO2: 26 mmol/L (ref 22–32)
Calcium: 8 mg/dL — ABNORMAL LOW (ref 8.9–10.3)
Calcium: 8.3 mg/dL — ABNORMAL LOW (ref 8.9–10.3)
Chloride: 100 mmol/L (ref 98–111)
Chloride: 99 mmol/L (ref 98–111)
Creatinine, Ser: 1.08 mg/dL (ref 0.61–1.24)
Creatinine, Ser: 1.11 mg/dL (ref 0.61–1.24)
GFR, Estimated: >60 mL/min (ref >60)
GFR, Estimated: >60 mL/min (ref >60)
Glucose, Bld: 111 mg/dL — ABNORMAL HIGH (ref 70–99)
Glucose, Bld: 112 mg/dL — ABNORMAL HIGH (ref 70–99)
Potassium: 3.9 mmol/L (ref 3.5–5.1)
Potassium: 4.1 mmol/L (ref 3.5–5.1)
Sodium: 133 mmol/L — ABNORMAL LOW (ref 135–145)
Sodium: 135 mmol/L (ref 135–145)

## 2022-09-09 LAB — CBC
HCT: 27.8 % — ABNORMAL LOW (ref 39.0–52.0)
HCT: 28 % — ABNORMAL LOW (ref 39.0–52.0)
Hemoglobin: 9.6 g/dL — ABNORMAL LOW (ref 13.0–17.0)
Hemoglobin: 9.7 g/dL — ABNORMAL LOW (ref 13.0–17.0)
MCH: 34 pg (ref 26.0–34.0)
MCH: 34.2 pg — ABNORMAL HIGH (ref 26.0–34.0)
MCHC: 34.5 g/dL (ref 30.0–36.0)
MCHC: 34.6 g/dL (ref 30.0–36.0)
MCV: 98.2 fL (ref 80.0–100.0)
MCV: 98.9 fL (ref 80.0–100.0)
Platelets: 140 10*3/uL — ABNORMAL LOW (ref 150–400)
Platelets: 149 10*3/uL — ABNORMAL LOW (ref 150–400)
RBC: 2.81 MIL/uL — ABNORMAL LOW (ref 4.22–5.81)
RBC: 2.85 MIL/uL — ABNORMAL LOW (ref 4.22–5.81)
RDW: 11.9 % (ref 11.5–15.5)
RDW: 12 % (ref 11.5–15.5)
WBC: 7 10*3/uL (ref 4.0–10.5)
WBC: 7.2 10*3/uL (ref 4.0–10.5)
nRBC: 0 % (ref 0.0–0.2)
nRBC: 0 % (ref 0.0–0.2)

## 2022-09-09 LAB — MAGNESIUM: Magnesium: 2.1 mg/dL (ref 1.7–2.4)

## 2022-09-09 LAB — GLUCOSE, CAPILLARY
Glucose-Capillary: 103 mg/dL — ABNORMAL HIGH (ref 70–99)
Glucose-Capillary: 112 mg/dL — ABNORMAL HIGH (ref 70–99)

## 2022-09-09 MED ORDER — POTASSIUM CHLORIDE CRYS ER 20 MEQ PO TBCR
20.0000 meq | EXTENDED_RELEASE_TABLET | Freq: Three times a day (TID) | ORAL | Status: AC
  Administered 2022-09-09 (×3): 20 meq via ORAL
  Filled 2022-09-09 (×3): qty 1

## 2022-09-09 MED ORDER — FUROSEMIDE 10 MG/ML IJ SOLN
40.0000 mg | Freq: Once | INTRAMUSCULAR | Status: AC
  Administered 2022-09-09: 40 mg via INTRAVENOUS
  Filled 2022-09-09: qty 4

## 2022-09-09 MED ORDER — METOPROLOL TARTRATE 25 MG/10 ML ORAL SUSPENSION: 25.0000 mg | Freq: Two times a day (BID) | ORAL | Status: DC

## 2022-09-09 MED ORDER — METOPROLOL TARTRATE 25 MG PO TABS
25.0000 mg | ORAL_TABLET | Freq: Two times a day (BID) | ORAL | Status: DC
  Administered 2022-09-09 – 2022-09-10 (×3): 25 mg via ORAL
  Filled 2022-09-09 (×3): qty 1

## 2022-09-09 MED ORDER — ALPRAZOLAM 0.5 MG PO TABS
0.5000 mg | ORAL_TABLET | Freq: Two times a day (BID) | ORAL | Status: DC | PRN
  Administered 2022-09-09 – 2022-09-10 (×3): 0.5 mg via ORAL
  Filled 2022-09-09 (×3): qty 1

## 2022-09-09 NOTE — Progress Notes (Signed)
     [address]       [place] [address]             [phone]       EVENING ROUNDS  POD #2 SP ascending and hemiarch replacement In bathroom Otherwise has been ambulating well

## 2022-09-09 NOTE — Discharge Instructions (Signed)
Discharge Instructions:  1. You may shower, please wash incisions daily with soap and water and keep dry.  If you wish to cover wounds with dressing you may do so but please keep clean and change daily.  No tub baths or swimming until incisions have completely healed.  If your incisions become red or develop any drainage please call our office at [phone]  2. No Driving until cleared by surgeon office and you are no longer using narcotic pain medications  3. Monitor your weight daily.. Please use the same scale and weigh at same time... If you gain 5-10 lbs in 48 hours with associated lower extremity swelling, please contact our office at [phone]  4. Fever of 101.5 for at least 24 hours with no source, please contact our office at [phone]  5. Activity- up as tolerated, please walk at least 3 times per day.  Avoid strenuous activity, no lifting, pushing, or pulling with your arms over 8-10 lbs for a minimum of 6 weeks  6. If any questions or concerns arise, please do not hesitate to contact our office at [phone]

## 2022-09-09 NOTE — Progress Notes (Signed)
2 Days Post-Op Procedure(s) (LRB): REPLACEMENT ASCENDING AORTA (N/A) TRANSESOPHAGEAL ECHOCARDIOGRAM (TEE) (N/A) Subjective: Feels well but did not sleep much. Ambulating well. Pain under good control with Toradol.  Objective: Vital signs in last 24 hours: Temp:  [98.2 F (36.8 C)-98.9 F (37.2 C)] 98.3 F (36.8 C) (02/28 0351) Pulse Rate:  [63-89] 71 (02/28 0608) Cardiac Rhythm: Normal sinus rhythm;Atrial paced (02/27 2000) Resp:  [13-27] 17 (02/28 0608) BP: (99-145)/(60-84) 126/73 (02/28 0608) SpO2:  [91 %-97 %] 93 % (02/28 [id number]) Arterial Line BP: (114-118)/(52-55) 118/52 (02/27 0900) Weight:  [96.7 kg] 96.7 kg (02/28 0500)  Hemodynamic parameters for last 24 hours: PAP: (24)/(11) 24/11  Intake/Output from previous day: 02/27 0701 - 02/28 0700 In: 356.4 [I.V.:56.4; IV Piggyback:300] Out: 3145 [Urine:2800; Chest Tube:345] Intake/Output this shift: No intake/output data recorded.  General appearance: alert and cooperative Neurologic: intact Heart: regular rate and rhythm, S1, S2 normal, no murmur Lungs: clear to auscultation bilaterally Extremities: edema minimal Wound: dressing dry  Lab Results: Recent Labs    09/08/22 2336 09/09/22 0558  WBC 7.2 7.0  HGB 9.6* 9.7*  HCT 27.8* 28.0*  PLT 149* 140*   BMET:  Recent Labs    09/08/22 2336 09/09/22 0558  NA 133* 135  K 4.1 3.9  CL 99 100  CO2 26 26  GLUCOSE 111* 112*  BUN 18 15  CREATININE 1.11 1.08  CALCIUM 8.0* 8.3*    PT/INR:  Recent Labs    09/07/22 1251  LABPROT 16.4*  INR 1.3*   ABG    Component Value Date/Time   PHART 7.361 09/07/2022 1708   HCO3 23.5 09/07/2022 1708   TCO2 25 09/07/2022 1708   ACIDBASEDEF 2.0 09/07/2022 1708   O2SAT 97 09/07/2022 1708   CBG (last 3)  Recent Labs    09/08/22 2029 09/09/22 0008 09/09/22 0353  GLUCAP 119* 103* 112*   CXR: clear   Assessment/Plan: S/P Procedure(s) (LRB): REPLACEMENT ASCENDING AORTA (N/A) TRANSESOPHAGEAL ECHOCARDIOGRAM (TEE)  (N/A)  POD 2 Hemodynamically stable in sinus rhythm 80's. Will increase Lopressor to 25 bid.  DC pacing wires.   DC sleeve 2 hrs after pacing wires is stable.  Chest tubes had 100 cc out overnight. Will keep in for now.  Wt down 2 lbs, still about 2 lbs over preop. Will give a dose of lasix and some Kdur today.  Continue IS, ambulation.  Glucose under good control. DC CBG's.   LOS: 2 days    [name] 09/09/2022

## 2022-09-10 LAB — ECHO INTRAOPERATIVE TEE
AV Mean grad: 5 mmHg
AV Peak grad: 11 mmHg
AV Vena cont: 0.38 cm
Ao pk vel: 1.66 m/s
Height: 73 in
P 1/2 time: 632 msec
Weight: 3376 oz

## 2022-09-10 MED ORDER — POTASSIUM CHLORIDE CRYS ER 20 MEQ PO TBCR
20.0000 meq | EXTENDED_RELEASE_TABLET | Freq: Two times a day (BID) | ORAL | Status: AC
  Administered 2022-09-10 (×2): 20 meq via ORAL
  Filled 2022-09-10 (×2): qty 1

## 2022-09-10 MED ORDER — SODIUM CHLORIDE 0.9 % IV SOLN: 250.0000 mL | INTRAVENOUS | Status: DC | PRN

## 2022-09-10 MED ORDER — ONDANSETRON HCL 4 MG PO TABS: 4.0000 mg | ORAL_TABLET | Freq: Four times a day (QID) | ORAL | Status: DC | PRN

## 2022-09-10 MED ORDER — ~~LOC~~ CARDIAC SURGERY, PATIENT & FAMILY EDUCATION: Freq: Once | Status: DC

## 2022-09-10 MED ORDER — ONDANSETRON HCL 4 MG/2ML IJ SOLN: 4.0000 mg | Freq: Four times a day (QID) | INTRAMUSCULAR | Status: DC | PRN

## 2022-09-10 MED ORDER — SODIUM CHLORIDE 0.9% FLUSH
3.0000 mL | Freq: Two times a day (BID) | INTRAVENOUS | Status: DC
  Administered 2022-09-10: 3 mL via INTRAVENOUS

## 2022-09-10 MED ORDER — SODIUM CHLORIDE 0.9% FLUSH: 3.0000 mL | INTRAVENOUS | Status: DC | PRN

## 2022-09-10 MED ORDER — ASPIRIN 81 MG PO TBEC
81.0000 mg | DELAYED_RELEASE_TABLET | Freq: Every day | ORAL | Status: DC
  Administered 2022-09-11: 81 mg via ORAL
  Filled 2022-09-10: qty 1

## 2022-09-10 MED ORDER — METOPROLOL TARTRATE 25 MG PO TABS
25.0000 mg | ORAL_TABLET | Freq: Two times a day (BID) | ORAL | Status: DC
  Administered 2022-09-10 – 2022-09-11 (×2): 25 mg via ORAL
  Filled 2022-09-10 (×2): qty 1

## 2022-09-10 MED FILL — Heparin Sodium (Porcine) Inj 1000 Unit/ML: INTRAMUSCULAR | Qty: 2500 | Status: AC

## 2022-09-10 MED FILL — Heparin Sodium (Porcine) Inj 1000 Unit/ML: Qty: 1000 | Status: AC

## 2022-09-10 MED FILL — Potassium Chloride Inj 2 mEq/ML: INTRAVENOUS | Qty: 40 | Status: AC

## 2022-09-10 MED FILL — Lidocaine HCl Local Preservative Free (PF) Inj 2%: INTRAMUSCULAR | Qty: 14 | Status: AC

## 2022-09-10 NOTE — Progress Notes (Signed)
Pt arrived to 4E from [facility]. Pt oriented to room and staff. Vitals obtained. Pt independent in room. Pt denies needs.

## 2022-09-10 NOTE — Anesthesia Postprocedure Evaluation (Signed)
Anesthesia Post Note  Patient: Austin Houston  Procedure(s) Performed: REPLACEMENT ASCENDING AORTA TRANSESOPHAGEAL ECHOCARDIOGRAM (TEE)     Patient location during evaluation: SICU Anesthesia Type: General Level of consciousness: sedated Pain management: pain level controlled Vital Signs Assessment: post-procedure vital signs reviewed and stable Respiratory status: patient remains intubated per anesthesia plan Cardiovascular status: stable Postop Assessment: no apparent nausea or vomiting Anesthetic complications: no   No notable events documented.  Last Vitals:  Vitals:   09/10/22 0945 09/10/22 1053  BP: 135/84 134/80  Pulse: 69 61  Resp: (!) 21 20  Temp:  (!) 36.4 C  SpO2: 96% 100%    Last Pain:  Vitals:   09/10/22 1053  TempSrc: Oral  PainSc: 0-No pain                 Rosalena Mccorry S

## 2022-09-10 NOTE — Progress Notes (Signed)
3 Days Post-Op Procedure(s) (LRB): REPLACEMENT ASCENDING AORTA (N/A) TRANSESOPHAGEAL ECHOCARDIOGRAM (TEE) (N/A) Subjective: No complaints.  Objective: Vital signs in last 24 hours: Temp:  [97.9 F (36.6 C)-99.3 F (37.4 C)] 98.6 F (37 C) (02/29 0348) Pulse Rate:  [58-83] 59 (02/29 0600) Cardiac Rhythm: Normal sinus rhythm (02/28 2000) Resp:  [14-24] 15 (02/29 0600) BP: (102-144)/(43-105) 117/81 (02/29 0600) SpO2:  [90 %-99 %] 95 % (02/29 0600) Weight:  [93.8 kg] 93.8 kg (02/29 0500)  Hemodynamic parameters for last 24 hours:    Intake/Output from previous day: 02/28 0701 - 02/29 0700 In: 720 [P.O.:720] Out: 2660 [Urine:2350; Chest Tube:310] Intake/Output this shift: Total I/O In: -  Out: 750 [Urine:550; Chest Tube:200]  General appearance: alert and cooperative Neurologic: intact Heart: regular rate and rhythm, S1, S2 normal, no murmur Lungs: clear to auscultation bilaterally Extremities: no edema Wound: incision healing well  Lab Results: Recent Labs    09/08/22 2336 09/09/22 0558  WBC 7.2 7.0  HGB 9.6* 9.7*  HCT 27.8* 28.0*  PLT 149* 140*   BMET:  Recent Labs    09/08/22 2336 09/09/22 0558  NA 133* 135  K 4.1 3.9  CL 99 100  CO2 26 26  GLUCOSE 111* 112*  BUN 18 15  CREATININE 1.11 1.08  CALCIUM 8.0* 8.3*    PT/INR:  Recent Labs    09/07/22 1251  LABPROT 16.4*  INR 1.3*   ABG    Component Value Date/Time   PHART 7.361 09/07/2022 1708   HCO3 23.5 09/07/2022 1708   TCO2 25 09/07/2022 1708   ACIDBASEDEF 2.0 09/07/2022 1708   O2SAT 97 09/07/2022 1708   CBG (last 3)  Recent Labs    09/08/22 2029 09/09/22 0008 09/09/22 0353  GLUCAP 119* 103* 112*    Assessment/Plan: S/P Procedure(s) (LRB): REPLACEMENT ASCENDING AORTA (N/A) TRANSESOPHAGEAL ECHOCARDIOGRAM (TEE) (N/A)  POD 3 Hemodynamically stable in sinus rhythm. Continue Lopressor.  DC chest tubes.  Wt is below preop.  Transfer to 4E and continue mobilization. Plan home  tomorrow if no changes.   LOS: 3 days    [name] 09/10/2022

## 2022-09-10 NOTE — Progress Notes (Signed)
Pt received O[facility] book and education on restrictions, heart healthy diet, ex guidelines, Move in the Tube sheet, incentive spirometer use when d/c and CRPII. Pt denies questions and was encouraged to look in the book for additional information. Referral placed to [facility].    Pt walking very well independently in hallway.  Christen Bame 09/10/2022 9:40 AM  [id number]

## 2022-09-11 ENCOUNTER — Inpatient Hospital Stay (HOSPITAL_COMMUNITY): Payer: 59

## 2022-09-11 ENCOUNTER — Other Ambulatory Visit: Payer: Self-pay

## 2022-09-11 MED ORDER — METOPROLOL TARTRATE 25 MG PO TABS
25.0000 mg | ORAL_TABLET | Freq: Two times a day (BID) | ORAL | 1 refills | Status: DC
  Filled 2022-09-11: qty 60, 30d supply, fill #0

## 2022-09-11 MED ORDER — OXYCODONE HCL 5 MG PO TABS
5.0000 mg | ORAL_TABLET | Freq: Four times a day (QID) | ORAL | 0 refills | Status: AC | PRN
  Filled 2022-09-11: qty 28, 7d supply, fill #0

## 2022-09-11 NOTE — Progress Notes (Signed)
4 Days Post-Op Procedure(s) (LRB): REPLACEMENT ASCENDING AORTA (N/A) TRANSESOPHAGEAL ECHOCARDIOGRAM (TEE) (N/A) Subjective:  No complaints. Ambulating well. Already cleaned up and ready to go home.  Objective: Vital signs in last 24 hours: Temp:  [97.5 F (36.4 C)-98.3 F (36.8 C)] 98.3 F (36.8 C) (03/01 0401) Pulse Rate:  [61-77] 73 (03/01 0401) Cardiac Rhythm: Normal sinus rhythm (02/29 1958) Resp:  [17-21] 17 (03/01 0401) BP: (98-143)/(53-87) 133/79 (03/01 0401) SpO2:  [93 %-100 %] 95 % (03/01 0401) Weight:  [93.4 kg-94 kg] 94 kg (03/01 0401)  Hemodynamic parameters for last 24 hours:    Intake/Output from previous day: 02/29 0701 - 03/01 0700 In: 250 [P.O.:250] Out: -  Intake/Output this shift: No intake/output data recorded.  General appearance: alert and cooperative Neurologic: intact Heart: regular rate and rhythm, S1, S2 normal, no murmur Lungs: clear to auscultation bilaterally Extremities: no edema Wound: incision healing well.  Lab Results: Recent Labs    09/08/22 2336 09/09/22 0558  WBC 7.2 7.0  HGB 9.6* 9.7*  HCT 27.8* 28.0*  PLT 149* 140*   BMET:  Recent Labs    09/08/22 2336 09/09/22 0558  NA 133* 135  K 4.1 3.9  CL 99 100  CO2 26 26  GLUCOSE 111* 112*  BUN 18 15  CREATININE 1.11 1.08  CALCIUM 8.0* 8.3*    PT/INR: No results for input(s): "LABPROT", "INR" in the last 72 hours. ABG    Component Value Date/Time   PHART 7.361 09/07/2022 1708   HCO3 23.5 09/07/2022 1708   TCO2 25 09/07/2022 1708   ACIDBASEDEF 2.0 09/07/2022 1708   O2SAT 97 09/07/2022 1708   CBG (last 3)  Recent Labs    09/08/22 2029 09/09/22 0008 09/09/22 0353  GLUCAP 119* 103* 112*   CXR: ok  Assessment/Plan: S/P Procedure(s) (LRB): REPLACEMENT ASCENDING AORTA (N/A) TRANSESOPHAGEAL ECHOCARDIOGRAM (TEE) (N/A)  POD 4 Hemodynamically stable in sinus rhythm. Continue Lopressor. He was on Norvasc and Losartan previously. Will hold off on those for now and  plan to resume as needed when he comes back to the office.  Will use oxy IR and ibuprofen for pain.   Xanax as needed for sleep for a couple weeks.  ASA 81 mg.  Plan home today.   LOS: 4 days    [name] 09/11/2022

## 2022-09-11 NOTE — Progress Notes (Signed)
Pt is stable hemodynamically, afebrile, alert and fully oriented x 4, NSR on the monitor. SPO2  94-100% at night.   Pt ambulates independently in room and in hallway. No SOB with ambulation. Pain is well controlled with Toradol at bed time. Pt requests Xanax at bed time per his routine sleeping aid regimen.  His sternal incision is dry and clean, no drainage. No acute distress is noted over night. We will continue to monitor.   Kennyth Lose, RN

## 2022-09-14 ENCOUNTER — Telehealth: Payer: Self-pay

## 2022-09-14 NOTE — Transitions of Care (Post Inpatient/ED Visit) (Unsigned)
   09/14/2022  Name: DELEON SEMRAD MRN: [id number] DOB: 29-Apr-1961  Today's TOC FU Call Status: Today's TOC FU Call Status:: Unsuccessul Call (1st Attempt) Unsuccessful Call (1st Attempt) Date: 09/14/22  Attempted to reach the patient regarding the most recent Inpatient/ED visit.  Follow Up Plan: Additional outreach attempts will be made to reach the patient to complete the Transitions of Care (Post Inpatient/ED visit) call.   [place] LPN [place] Advisor Direct Dial [phone]

## 2022-09-15 NOTE — Transitions of Care (Post Inpatient/ED Visit) (Signed)
   09/15/2022  Name: Austin [place] MRN: [id number] DOB: 12-31-1960  Today's TOC FU Call Status: Today's TOC FU Call Status:: Successful TOC FU Call Competed Unsuccessful Call (1st Attempt) Date: 09/14/22 [facility] FU Call Complete Date: 09/15/22  Transition Care Management Follow-up Telephone Call Date of Discharge: 09/11/22 Discharge Facility: [facility]) Type of Discharge: Inpatient Admission Primary Inpatient Discharge Diagnosis:: S/P ascending aortic aneurysm repair How have you been since you were released from the hospital?: Better Any questions or concerns?: No  Items Reviewed: Did you receive and understand the discharge instructions provided?: Yes Medications obtained and verified?: Yes (Medications Reviewed) Any new allergies since your discharge?: No Dietary orders reviewed?: Yes Do you have support at home?: Yes  Home Care and Equipment/Supplies: [place] Ordered?: No Any new equipment or medical supplies ordered?: No  Functional Questionnaire: Do you need assistance with bathing/showering or dressing?: No Do you need assistance with meal preparation?: No Do you need assistance with eating?: No Do you have difficulty maintaining continence: No Do you need assistance with getting out of bed/getting out of a chair/moving?: No Do you have difficulty managing or taking your medications?: No  Folllow up appointments reviewed: PCP Follow-up appointment confirmed?: No (specialist) MD Provider Line Number:[phone] Given: Yes [facility] Follow-up appointment confirmed?: Yes Date of Specialist follow-up appointment?: 10/07/22 Follow-Up Specialty Provider:: Dr Cyndia Bent Do you need transportation to your follow-up appointment?: No Do you understand care options if your condition(s) worsen?: Yes-patient verbalized understanding    [place] LPN [place] Direct Dial [phone]

## 2022-09-17 ENCOUNTER — Ambulatory Visit: Payer: 59 | Attending: Cardiovascular Disease | Admitting: Cardiovascular Disease

## 2022-09-17 ENCOUNTER — Encounter: Payer: Self-pay | Admitting: Cardiovascular Disease

## 2022-09-17 ENCOUNTER — Ambulatory Visit: Payer: Self-pay

## 2022-09-17 ENCOUNTER — Other Ambulatory Visit: Payer: Self-pay

## 2022-09-17 VITALS — BP 110/64 | HR 92 | Ht 73.0 in | Wt 210.0 lb

## 2022-09-17 DIAGNOSIS — I7121 Aneurysm of the ascending aorta, without rupture: Secondary | ICD-10-CM | POA: Diagnosis not present

## 2022-09-17 DIAGNOSIS — Z4802 Encounter for removal of sutures: Secondary | ICD-10-CM

## 2022-09-17 DIAGNOSIS — I9789 Other postprocedural complications and disorders of the circulatory system, not elsewhere classified: Secondary | ICD-10-CM

## 2022-09-17 DIAGNOSIS — I10 Essential (primary) hypertension: Secondary | ICD-10-CM

## 2022-09-17 DIAGNOSIS — I4891 Unspecified atrial fibrillation: Secondary | ICD-10-CM

## 2022-09-17 MED ORDER — METOPROLOL TARTRATE 25 MG PO TABS
25.0000 mg | ORAL_TABLET | Freq: Two times a day (BID) | ORAL | 1 refills | Status: DC
  Filled 2022-09-17 – 2022-09-25 (×2): qty 180, 90d supply, fill #0
  Filled 2022-09-28: qty 60, 30d supply, fill #0

## 2022-09-17 NOTE — Progress Notes (Signed)
Cardiology Office Note   Date:  09/17/2022   ID:  Austin [place], DOB 19-May-1961, MRN [id number]  PCP:  Owens Loffler, MD  Cardiologist:   Kathlyn Sacramento, MD   Chief Complaint  Patient presents with   Follow-up    Cath F/U, had afib over night       History of Present Illness: BREIGHTON RATER is a 62 y.o. male who presents for evaluation of tachycardia.  He works in our [employer] and has known history of essential hypertension, hyperlipidemia, prostate cancer and thyroid cancer status post partial thyroidectomy. He is known to have a ascending aortic aneurysm which recently progressed to 5 cm.  His brother died in 2020-10-06 from ascending aortic dissection.  He had an echocardiogram done in December 2023 which showed hyperdynamic LV systolic function with tricuspid aortic valve.  Cardiac catheterization in January of this year showed minimal irregularities with no obstructive disease.  LVEDP was normal. He underwent ascending aortic aneurysm repair on February 26.  Postoperative course was unremarkable and he was discharged home without issues.  However, he woke up this morning with chest pain and palpitations.  He checked his pulse with his smart watch and he was tachycardic around 130 bpm.  He was taking small dose metoprolol 12.5 mg and took an additional dose with subsequent improvement.  He continues to be in atrial fibrillation now but he feels much better since his ventricular rate is controlled.   Past Medical History:  Diagnosis Date   Actinic keratosis    Allergic rhinitis due to pollen    Asthma    vs COPD- taking inhalers- as related to allergies only.   Basal cell carcinoma 03/26/2020   R lat deltoid - ED&C    Dysplastic nevus 03/07/2020   L costal infrapectoral - moderate   Dysplastic nevus 03/07/2020   R post flank above waistline - moderate   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HCC)    s/p partial thyroidectomy (follicular adenoma)-surgery only   GERD  (gastroesophageal reflux disease)    Hearing impaired person, bilateral    hearing aida bilateral"high frequency loss   Hyperlipidemia    Hypertension    Prostate cancer ([place]) 01/08/2016   PTSD (post-traumatic stress disorder)    s/p multiple active deployments for [employer], no medications taken currently    Past Surgical History:  Procedure Laterality Date   COLONOSCOPY W/ POLYPECTOMY     age 79 "adenoma"   INGUINAL HERNIA REPAIR     right   LEFT HEART CATH AND CORONARY ANGIOGRAPHY N/A 07/20/2022   Procedure: LEFT HEART CATH AND CORONARY ANGIOGRAPHY;  Surgeon: Sherren Mocha, MD;  Location: [place] [facility];  Service: Cardiovascular;  Laterality: N/A;   REPLACEMENT ASCENDING AORTA N/A 09/07/2022   Procedure: REPLACEMENT ASCENDING AORTA;  Surgeon: [name], MD;  Location: [place];  Service: Open Heart Surgery;  Laterality: N/A;  WITH CIRC ARREST   ROBOT ASSISTED LAPAROSCOPIC RADICAL PROSTATECTOMY N/A 07/23/2016   Procedure: XI ROBOTIC ASSISTED LAPAROSCOPIC RADICAL PROSTATECTOMY LEVEL 1;  Surgeon: Raynelle Bring, MD;  Location: [facility];  Service: Urology;  Laterality: N/A;   [place] WITHOUT CARDIOVERSION N/A 09/07/2022   Procedure: TRANSESOPHAGEAL ECHOCARDIOGRAM ([place]);  Surgeon: [name], MD;  Location: [place];  Service: Open Heart Surgery;  Laterality: N/A;   THYROIDECTOMY, PARTIAL     [name])     Current Outpatient Medications  Medication Sig Dispense Refill   acetaminophen (TYLENOL) 325 MG tablet Take 2 tablets (  650 mg total) by mouth every 6 (six) hours as needed. (Patient taking differently: Take 650 mg by mouth as needed for moderate pain.) 36 tablet 0   albuterol (VENTOLIN HFA) 108 (90 Base) MCG/ACT inhaler INHALE 2 PUFFS INTO THE LUNGS EVERY 6 (SIX) HOURS AS NEEDED FOR WHEEZING. (Patient taking differently: Inhale 2 puffs into the lungs as needed for wheezing or shortness of breath.) 20.1 g 3   ALPRAZolam (XANAX) 0.25 MG tablet Take 1 tablet (0.25 mg total) by mouth  2 (two) times daily as needed for anxiety or sleep. (Patient taking differently: Take 0.25 mg by mouth as needed for anxiety or sleep.) 60 tablet 3   aspirin EC 81 MG tablet Take 81 mg by mouth daily. Swallow whole.     budesonide-formoterol (SYMBICORT) 160-4.5 MCG/ACT inhaler Inhale 2 puffs into the lungs in the morning and at bedtime. (Patient taking differently: Inhale 2 puffs into the lungs daily.) 10.2 g 5   Carboxymethylcellul-Glycerin (LUBRICATING EYE DROPS OP) Apply 2 drops to eye daily as needed (dry eyes).     cetirizine (ZYRTEC) 10 MG tablet Take 10 mg by mouth daily.     diclofenac Sodium (VOLTAREN) 1 % GEL Apply 1 a small amount to affected area twice a day (Patient taking differently: Apply 1 Application topically daily as needed.) 100 g 0   fluticasone (FLONASE) 50 MCG/ACT nasal spray Place 2 sprays into both nostrils daily. (Patient taking differently: Place 1 spray into both nostrils daily.) 16 g 6   ibuprofen (ADVIL) 200 MG tablet Take 400 mg by mouth every 8 (eight) hours as needed for moderate pain.     magnesium oxide (MAG-OX) 400 (240 Mg) MG tablet Take 400 mg by mouth daily.     metoprolol tartrate (LOPRESSOR) 25 MG tablet Take 1 tablet (25 mg total) by mouth 2 (two) times daily. 60 tablet 1   montelukast (SINGULAIR) 10 MG tablet Take 1 tablet (10 mg total) by mouth at bedtime. 30 tablet 5   Multiple Vitamin (MULTIVITAMIN WITH MINERALS) TABS tablet Take 1 tablet by mouth daily.     omeprazole (PRILOSEC) 40 MG capsule Take 1 capsule (40 mg total) by mouth daily. 90 capsule 3   ondansetron (ZOFRAN) 4 MG tablet Take 1 tablet (4 mg total) by mouth every 8 (eight) hours as needed for nausea or vomiting. (Patient taking differently: Take 4 mg by mouth as needed for nausea or vomiting.) 20 tablet 2   oxyCODONE (OXY IR/ROXICODONE) 5 MG immediate release tablet Take 1 tablet (5 mg total) by mouth every 6 (six) hours as needed for up to 7 days for severe pain. 28 tablet 0   PRESCRIPTION  MEDICATION Apply 1 Application topically as needed (rash on legs). Clobetasol Cream     promethazine (PHENERGAN) 25 MG suppository Place 1 suppository (25 mg total) rectally every 6 (six) hours as needed for nausea or vomiting. (Patient taking differently: Place 25 mg rectally as needed for nausea or vomiting.) 12 each 2   No current facility-administered medications for this visit.    Allergies:   Vioxx [rofecoxib], Celebrex [celecoxib], Codeine, and Doxycycline    Social History:  The patient  reports that he has never smoked. He quit smokeless tobacco use about 9 years ago.  His smokeless tobacco use included snuff. He reports current alcohol use of about 10.0 standard drinks of alcohol per week. He reports that he does not use drugs.   Family History:  The patient's family history includes Heart disease in  his paternal grandmother; Hyperlipidemia in his father and mother; Hypertension in his maternal grandfather and maternal grandmother; Other in his father and mother; Prostate cancer in his father; Sudden death (age of onset: 78) in his brother.    ROS:  Please see the history of present illness.   Otherwise, review of systems are positive for none.   All other systems are reviewed and negative.    PHYSICAL EXAM: VS:  BP 110/64 (BP Location: Left Arm, Patient Position: Sitting, Cuff Size: Normal)   Pulse 92   Ht '6\' 1"'$  (1.854 m)   Wt 210 lb (95.3 kg)   SpO2 100%   BMI 27.71 kg/m  , BMI Body mass index is 27.71 kg/m. GEN: Well nourished, well developed, in no acute distress  HEENT: normal  Neck: no JVD, carotid bruits, or masses Cardiac: Irregularly irregular; no murmurs, rubs, or gallops,no edema  Respiratory:  clear to auscultation bilaterally, normal work of breathing GI: soft, nontender, nondistended, + BS MS: no deformity or atrophy  Skin: warm and dry, no rash Neuro:  Strength and sensation are intact Psych: euthymic mood, full affect   EKG:  EKG is ordered today. The  ekg ordered today demonstrates atrial fibrillation with a PVC and nonspecific T wave changes.   Recent Labs: 09/03/2022: ALT 138 09/08/2022: Magnesium 2.1 09/09/2022: BUN 15; Creatinine, Ser 1.08; Hemoglobin 9.7; Platelets 140; Potassium 3.9; Sodium 135    Lipid Panel    Component Value Date/Time   CHOL 246 (H) 03/23/2022 1105   TRIG 112.0 03/23/2022 1105   HDL 95.40 03/23/2022 1105   CHOLHDL 3 03/23/2022 1105   VLDL 22.4 03/23/2022 1105   LDLCALC 129 (H) 03/23/2022 1105      Wt Readings from Last 3 Encounters:  09/17/22 210 lb (95.3 kg)  09/11/22 207 lb 3.2 oz (94 kg)  09/03/22 211 lb 8 oz (95.9 kg)          No data to display            ASSESSMENT AND PLAN:  1.  Postoperative atrial fibrillation: He was tachycardic this morning but improved with an additional dose of metoprolol.  He was symptomatic initially but reports improvement now.  Recommend changing metoprolol to tartrate to 25 mg twice daily for rate control.  Hopefully, atrial fibrillation is short-lived.  His CHA2DS2-VASc score is 1 and thus I do not think he requires anticoagulation at the present time especially in the postoperative state. However, if his ventricular rate becomes difficult to control especially if he is symptomatic, we will plan on initiating anticoagulation in the near future in preparation for cardioversion.  2.  Status post surgical repair of ascending aortic aneurysm: He seems to be recovering nicely.  3.  Essential hypertension: He used to be on amlodipine and losartan.  Will continue with metoprolol monotherapy for now given that his blood pressure is on the low side.  4.  Blood loss anemia: He started taking iron supplement yesterday.    Disposition:   FU with me in 1 month  Signed,  Kathlyn Sacramento, MD  09/17/2022 10:13 AM    [place]

## 2022-09-17 NOTE — Patient Instructions (Signed)
Medication Instructions:  INCREASE the Metoprolol Tartrate to 25 mg twice daily  *If you need a refill on your cardiac medications before your next appointment, please call your pharmacy*   Lab Work: None ordered If you have labs (blood work) drawn today and your tests are completely normal, you will receive your results only by: [place] (if you have MyChart) OR A paper copy in the mail If you have any lab test that is abnormal or we need to change your treatment, we will call you to review the results.   Testing/Procedures: None ordered   Follow-Up: At [facility], you and your health needs are our priority.  As part of our continuing mission to provide you with exceptional heart care, we have created designated Provider Care Teams.  These Care Teams include your primary Cardiologist (physician) and Advanced Practice Providers (APPs -  Physician Assistants and Nurse Practitioners) who all work together to provide you with the care you need, when you need it.  We recommend signing up for the patient portal called "MyChart".  Sign up information is provided on this After Visit Summary.  MyChart is used to connect with patients for Virtual Visits (Telemedicine).  Patients are able to view lab/test results, encounter notes, upcoming appointments, etc.  Non-urgent messages can be sent to your provider as well.   To learn more about what you can do with MyChart, go to [email or web address].    Your next appointment:   1 month  Provider:   You may see Dr. Fletcher Anon or one of the following Advanced Practice Providers on your designated Care Team:   Murray Hodgkins, NP Christell Faith, PA-C Cadence Kathlen Mody, PA-C Gerrie Nordmann, NP

## 2022-09-17 NOTE — Progress Notes (Signed)
Patient arrived for nurse visit to remove sutures post- procedure replacement of ascending aorta with Dr. Cyndia Bent.  Sutures removed with no signs/ symptoms of infection noted.  Incisions well approximated. Patient tolerated procedure well.  Patient/ family instructed to keep the incision sites clean and dry.  Patient/ family acknowledged instructions given.    Patient states that early this morning he went into Afib and has stayed in it. He states that his rate has been anywhere between 45-145. He has spoken to Dr. Burt Knack who advised him to take 50 of metoprolol instead of 25 as prescribed and his rate has stayed in the 80-90 range. He has an appointment with Cardiology today for the new onset Afib.

## 2022-09-18 MED FILL — Mannitol IV Soln 20%: INTRAVENOUS | Qty: 500 | Status: AC

## 2022-09-18 MED FILL — Sodium Bicarbonate IV Soln 8.4%: INTRAVENOUS | Qty: 100 | Status: AC

## 2022-09-18 MED FILL — Electrolyte-R (PH 7.4) Solution: INTRAVENOUS | Qty: 4000 | Status: AC

## 2022-09-18 MED FILL — Sodium Chloride IV Soln 0.9%: INTRAVENOUS | Qty: 3000 | Status: AC

## 2022-09-18 MED FILL — Heparin Sodium (Porcine) Inj 1000 Unit/ML: INTRAMUSCULAR | Qty: 30 | Status: AC

## 2022-09-18 MED FILL — Heparin Sodium (Porcine) Inj 1000 Unit/ML: INTRAMUSCULAR | Qty: 20 | Status: AC

## 2022-09-23 ENCOUNTER — Telehealth: Payer: Self-pay | Admitting: *Deleted

## 2022-09-23 NOTE — Telephone Encounter (Signed)
Patient contacted the office stating he was started on Metoprolol '25mg'$  BID last week. Since then, he has had a 7lb weight gain. Patient reports eating less than he was preop and he continues to walk. Patient denies extremity swelling or SOB. Per Dr. Cyndia Bent, patient advised to continue metoprolol at '25mg'$  BID. Patient advised to continue weighing himself daily and to contact the office if he has further weight gain or begins to experience extremity edema. Patient verbalized understanding.

## 2022-09-25 ENCOUNTER — Other Ambulatory Visit: Payer: Self-pay

## 2022-09-28 ENCOUNTER — Other Ambulatory Visit: Payer: Self-pay

## 2022-09-29 ENCOUNTER — Ambulatory Visit: Payer: 59 | Admitting: Dermatology

## 2022-10-02 ENCOUNTER — Other Ambulatory Visit: Payer: Self-pay | Admitting: Surgery

## 2022-10-02 DIAGNOSIS — C61 Malignant neoplasm of prostate: Secondary | ICD-10-CM | POA: Diagnosis not present

## 2022-10-02 DIAGNOSIS — I712 Thoracic aortic aneurysm, without rupture, unspecified: Secondary | ICD-10-CM

## 2022-10-07 ENCOUNTER — Encounter: Payer: Self-pay | Admitting: Surgery

## 2022-10-07 ENCOUNTER — Ambulatory Visit (INDEPENDENT_AMBULATORY_CARE_PROVIDER_SITE_OTHER): Payer: Self-pay | Admitting: Surgery

## 2022-10-07 ENCOUNTER — Ambulatory Visit
Admission: RE | Admit: 2022-10-07 | Discharge: 2022-10-07 | Disposition: A | Discharge status: Home / Self Care | Payer: 59 | Source: Ambulatory Visit | Attending: Surgery | Admitting: Surgery

## 2022-10-07 VITALS — BP 150/83 | HR 78 | Resp 20 | Ht 73.0 in | Wt 209.0 lb

## 2022-10-07 DIAGNOSIS — I712 Thoracic aortic aneurysm, without rupture, unspecified: Secondary | ICD-10-CM

## 2022-10-07 DIAGNOSIS — Z0389 Encounter for observation for other suspected diseases and conditions ruled out: Secondary | ICD-10-CM | POA: Diagnosis not present

## 2022-10-07 DIAGNOSIS — Z09 Encounter for follow-up examination after completed treatment for conditions other than malignant neoplasm: Secondary | ICD-10-CM

## 2022-10-07 DIAGNOSIS — I7121 Aneurysm of the ascending aorta, without rupture: Secondary | ICD-10-CM

## 2022-10-07 NOTE — Progress Notes (Signed)
HPI: Patient returns for routine postoperative follow-up having undergone supra-coronary replacement of the ascending aorta under deep hypothermic circulatory arrest on 09/07/2022. The patient's early postoperative recovery while in the hospital was notable for an uncomplicated postoperative course. Since hospital discharge the patient reports that he had an episode of atrial fibrillation with rapid ventricular response on the morning of 09/17/2022.  His heart rate was 130.  He took an additional dose of 12.5 mg metoprolol with improvement.  He was seen by cardiology later that morning and was noted to be in atrial fibrillation with a rate in the 90s.  He said this went back to sinus rhythm fairly quickly and he has had no further episodes of atrial fibrillation.  He has been walking daily without chest pain or shortness of breath.  His stamina has been improving.  His wife reports that he was having some low-grade fevers to 99.5 as well as chills but this has resolved.   Current Outpatient Medications  Medication Sig Dispense Refill   acetaminophen (TYLENOL) 325 MG tablet Take 2 tablets (650 mg total) by mouth every 6 (six) hours as needed. (Patient taking differently: Take 650 mg by mouth as needed for moderate pain.) 36 tablet 0   albuterol (VENTOLIN HFA) 108 (90 Base) MCG/ACT inhaler INHALE 2 PUFFS INTO THE LUNGS EVERY 6 (SIX) HOURS AS NEEDED FOR WHEEZING. (Patient taking differently: Inhale 2 puffs into the lungs as needed for wheezing or shortness of breath.) 20.1 g 3   ALPRAZolam (XANAX) 0.25 MG tablet Take 1 tablet (0.25 mg total) by mouth 2 (two) times daily as needed for anxiety or sleep. (Patient taking differently: Take 0.25 mg by mouth as needed for anxiety or sleep.) 60 tablet 3   aspirin EC 81 MG tablet Take 81 mg by mouth daily. Swallow whole.     budesonide-formoterol (SYMBICORT) 160-4.5 MCG/ACT inhaler Inhale 2 puffs into the lungs in the morning and at bedtime. (Patient taking  differently: Inhale 2 puffs into the lungs daily.) 10.2 g 5   Carboxymethylcellul-Glycerin (LUBRICATING EYE DROPS OP) Apply 2 drops to eye daily as needed (dry eyes).     cetirizine (ZYRTEC) 10 MG tablet Take 10 mg by mouth daily.     diclofenac Sodium (VOLTAREN) 1 % GEL Apply 1 a small amount to affected area twice a day (Patient taking differently: Apply 1 Application topically daily as needed.) 100 g 0   fluticasone (FLONASE) 50 MCG/ACT nasal spray Place 2 sprays into both nostrils daily. (Patient taking differently: Place 1 spray into both nostrils daily.) 16 g 6   ibuprofen (ADVIL) 200 MG tablet Take 400 mg by mouth every 8 (eight) hours as needed for moderate pain.     magnesium oxide (MAG-OX) 400 (240 Mg) MG tablet Take 400 mg by mouth daily.     metoprolol tartrate (LOPRESSOR) 25 MG tablet Take 1 tablet (25 mg total) by mouth 2 (two) times daily. 180 tablet 1   montelukast (SINGULAIR) 10 MG tablet Take 1 tablet (10 mg total) by mouth at bedtime. 30 tablet 5   Multiple Vitamin (MULTIVITAMIN WITH MINERALS) TABS tablet Take 1 tablet by mouth daily.     omeprazole (PRILOSEC) 40 MG capsule Take 1 capsule (40 mg total) by mouth daily. 90 capsule 3   ondansetron (ZOFRAN) 4 MG tablet Take 1 tablet (4 mg total) by mouth every 8 (eight) hours as needed for nausea or vomiting. (Patient taking differently: Take 4 mg by mouth as needed for nausea or vomiting.)  20 tablet 2   PRESCRIPTION MEDICATION Apply 1 Application topically as needed (rash on legs). Clobetasol Cream     promethazine (PHENERGAN) 25 MG suppository Place 1 suppository (25 mg total) rectally every 6 (six) hours as needed for nausea or vomiting. (Patient taking differently: Place 25 mg rectally as needed for nausea or vomiting.) 12 each 2   No current facility-administered medications for this visit.    Physical Exam: BP (!) 150/83   Pulse 78   Resp 20   Ht 6\' 1"  (1.854 m)   Wt 209 lb (94.8 kg)   SpO2 97% Comment: RA  BMI 27.57  kg/m  He looks well. Cardiac exam shows a regular rate and rhythm with normal heart sounds.  There is no murmur. Lungs are clear. The chest incision is healing well and the sternum is stable. There is no peripheral edema.  Diagnostic Tests:  Narrative & Impression  CLINICAL DATA:  Prior surgery for the ascending thoracic aorta.   EXAM: CHEST - 2 VIEW   COMPARISON:  09/11/2022.  Chest CT, 04/28/2022.   FINDINGS: Cardiac silhouette top-normal in size.   No mediastinal or hilar masses. No evidence of adenopathy. Thoracic aorta appears normal in caliber, but tortuous, unchanged.   Clear lungs.  Trace left pleural effusion.  No pneumothorax.   Skeletal structures are intact.   IMPRESSION: 1. No acute cardiopulmonary disease. 2. Trace residual left pleural effusion.     Electronically Signed   By: Lajean Manes M.D.   On: 10/07/2022 14:52      Impression:  Overall I think he is doing very well 1 month following his surgery.  He did have an episode of delayed postoperative atrial fibrillation but appears to have resolved.  He remains on Lopressor.  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  He has no further signs or symptoms of this.  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  Plan:  I will plan to see him back in the office in 1 month for follow-up.   [name], MD [facility] [phone]

## 2022-10-12 ENCOUNTER — Other Ambulatory Visit: Payer: Self-pay

## 2022-10-27 ENCOUNTER — Encounter: Payer: Self-pay | Admitting: Cardiovascular Disease

## 2022-10-27 ENCOUNTER — Ambulatory Visit: Payer: 59 | Attending: Cardiovascular Disease | Admitting: Cardiovascular Disease

## 2022-10-27 VITALS — BP 130/88 | HR 67 | Ht 73.0 in | Wt 210.1 lb

## 2022-10-27 DIAGNOSIS — I7121 Aneurysm of the ascending aorta, without rupture: Secondary | ICD-10-CM | POA: Diagnosis not present

## 2022-10-27 DIAGNOSIS — I9789 Other postprocedural complications and disorders of the circulatory system, not elsewhere classified: Secondary | ICD-10-CM | POA: Diagnosis not present

## 2022-10-27 DIAGNOSIS — I10 Essential (primary) hypertension: Secondary | ICD-10-CM | POA: Diagnosis not present

## 2022-10-27 DIAGNOSIS — I4891 Unspecified atrial fibrillation: Secondary | ICD-10-CM | POA: Diagnosis not present

## 2022-10-27 NOTE — Progress Notes (Signed)
Cardiology Office Note   Date:  10/27/2022   ID:  NATHANEL TALLMAN, DOB 12-Sep-1960, MRN [id number]  PCP:  Hannah Beat, MD  Cardiologist:   Lorine Bears, MD   Chief Complaint  Patient presents with   Follow-up    1 month f/u no complaints today. Meds reviewed verbally with pt.      History of Present Illness: Austin Houston is a 62 y.o. male who presents for a follow-up visit regarding postoperative atrial fibrillation.  He works in our [employer] and has known history of essential hypertension, hyperlipidemia, prostate cancer and thyroid cancer status post partial thyroidectomy. He is known to have a ascending aortic aneurysm which recently progressed to 5 cm.  His brother died in 2020-12-01 from ascending aortic dissection.  He had an echocardiogram done in December 2023 which showed hyperdynamic LV systolic function with tricuspid aortic valve.  Cardiac catheterization in January of this year showed minimal irregularities with no obstructive disease.  LVEDP was normal. He underwent ascending aortic aneurysm repair on February 26.  Postoperative course was unremarkable and he was discharged home without issues.   He was seen few weeks after the surgery with A-fib with RVR.  Metoprolol was increased to 25 mg twice daily and he has been doing well since then.  He reports only 1 episode of atrial fibrillation since that time.  It lasted only few minutes.  He is doing very well and denies chest pain or shortness of breath.   Past Medical History:  Diagnosis Date   Actinic keratosis    Allergic rhinitis due to pollen    Asthma    vs COPD- taking inhalers- as related to allergies only.   Basal cell carcinoma 03/26/2020   R lat deltoid - ED&C    Dysplastic nevus 03/07/2020   L costal infrapectoral - moderate   Dysplastic nevus 03/07/2020   R post flank above waistline - moderate   Follicular cancer of thyroid    s/p partial thyroidectomy (follicular adenoma)-surgery only   GERD  (gastroesophageal reflux disease)    Hearing impaired person, bilateral    hearing aida bilateral"high frequency loss   Hyperlipidemia    Hypertension    Prostate cancer 01/08/2016   PTSD (post-traumatic stress disorder)    s/p multiple active deployments for [employer], no medications taken currently    Past Surgical History:  Procedure Laterality Date   COLONOSCOPY W/ POLYPECTOMY     age 29 "adenoma"   INGUINAL HERNIA REPAIR     right   LEFT HEART CATH AND CORONARY ANGIOGRAPHY N/A 07/20/2022   Procedure: LEFT HEART CATH AND CORONARY ANGIOGRAPHY;  Surgeon: Tonny Bollman, MD;  Location: [facility] INVASIVE CV LAB;  Service: Cardiovascular;  Laterality: N/A;   REPLACEMENT ASCENDING AORTA N/A 09/07/2022   Procedure: REPLACEMENT ASCENDING AORTA;  Surgeon: Alleen Borne, MD;  Location: [facility] OR;  Service: Open Heart Surgery;  Laterality: N/A;  WITH CIRC ARREST   ROBOT ASSISTED LAPAROSCOPIC RADICAL PROSTATECTOMY N/A 07/23/2016   Procedure: XI ROBOTIC ASSISTED LAPAROSCOPIC RADICAL PROSTATECTOMY LEVEL 1;  Surgeon: Heloise Purpura, MD;  Location: [facility] ORS;  Service: Urology;  Laterality: N/A;   TEE WITHOUT CARDIOVERSION N/A 09/07/2022   Procedure: TRANSESOPHAGEAL ECHOCARDIOGRAM (TEE);  Surgeon: Alleen Borne, MD;  Location: [facility] OR;  Service: Open Heart Surgery;  Laterality: N/A;   THYROIDECTOMY, PARTIAL     [name])     Current Outpatient Medications  Medication Sig Dispense Refill   acetaminophen (TYLENOL) 325 MG  tablet Take 2 tablets (650 mg total) by mouth every 6 (six) hours as needed. (Patient taking differently: Take 650 mg by mouth as needed for moderate pain.) 36 tablet 0   albuterol (VENTOLIN HFA) 108 (90 Base) MCG/ACT inhaler INHALE 2 PUFFS INTO THE LUNGS EVERY 6 (SIX) HOURS AS NEEDED FOR WHEEZING. (Patient taking differently: Inhale 2 puffs into the lungs as needed for wheezing or shortness of breath.) 20.1 g 3   ALPRAZolam (XANAX) 0.25 MG tablet Take 1 tablet (0.25 mg total) by mouth 2  (two) times daily as needed for anxiety or sleep. (Patient taking differently: Take 0.25 mg by mouth as needed for anxiety or sleep.) 60 tablet 3   aspirin EC 81 MG tablet Take 81 mg by mouth daily. Swallow whole.     budesonide-formoterol (SYMBICORT) 160-4.5 MCG/ACT inhaler Inhale 2 puffs into the lungs in the morning and at bedtime. (Patient taking differently: Inhale 2 puffs into the lungs daily.) 10.2 g 5   Carboxymethylcellul-Glycerin (LUBRICATING EYE DROPS OP) Apply 2 drops to eye daily as needed (dry eyes).     cetirizine (ZYRTEC) 10 MG tablet Take 10 mg by mouth daily.     diclofenac Sodium (VOLTAREN) 1 % GEL Apply 1 a small amount to affected area twice a day (Patient taking differently: Apply 1 Application topically daily as needed.) 100 g 0   fluticasone (FLONASE) 50 MCG/ACT nasal spray Place 2 sprays into both nostrils daily. (Patient taking differently: Place 1 spray into both nostrils daily.) 16 g 6   ibuprofen (ADVIL) 200 MG tablet Take 400 mg by mouth every 8 (eight) hours as needed for moderate pain.     magnesium oxide (MAG-OX) 400 (240 Mg) MG tablet Take 400 mg by mouth daily.     metoprolol tartrate (LOPRESSOR) 25 MG tablet Take 1 tablet (25 mg total) by mouth 2 (two) times daily. 180 tablet 1   montelukast (SINGULAIR) 10 MG tablet Take 1 tablet (10 mg total) by mouth at bedtime. 30 tablet 5   Multiple Vitamin (MULTIVITAMIN WITH MINERALS) TABS tablet Take 1 tablet by mouth daily.     omeprazole (PRILOSEC) 40 MG capsule Take 1 capsule (40 mg total) by mouth daily. 90 capsule 3   ondansetron (ZOFRAN) 4 MG tablet Take 1 tablet (4 mg total) by mouth every 8 (eight) hours as needed for nausea or vomiting. (Patient taking differently: Take 4 mg by mouth as needed for nausea or vomiting.) 20 tablet 2   PRESCRIPTION MEDICATION Apply 1 Application topically as needed (rash on legs). Clobetasol Cream     promethazine (PHENERGAN) 25 MG suppository Place 1 suppository (25 mg total) rectally  every 6 (six) hours as needed for nausea or vomiting. (Patient taking differently: Place 25 mg rectally as needed for nausea or vomiting.) 12 each 2   No current facility-administered medications for this visit.    Allergies:   Vioxx [rofecoxib], Celebrex [celecoxib], Codeine, and Doxycycline    Social History:  The patient  reports that he has never smoked. He quit smokeless tobacco use about 9 years ago.  His smokeless tobacco use included snuff. He reports current alcohol use of about 10.0 standard drinks of alcohol per week. He reports that he does not use drugs.   Family History:  The patient's family history includes Heart disease in his paternal grandmother; Hyperlipidemia in his father and mother; Hypertension in his maternal grandfather and maternal grandmother; Other in his father and mother; Prostate cancer in his father; Sudden death (age  of onset: 74) in his brother.    ROS:  Please see the history of present illness.   Otherwise, review of systems are positive for none.   All other systems are reviewed and negative.    PHYSICAL EXAM: VS:  BP 130/88 (BP Location: Left Arm, Patient Position: Sitting, Cuff Size: Normal)   Pulse 67   Ht 6\' 1"  (1.854 m)   Wt 210 lb 2 oz (95.3 kg)   SpO2 94%   BMI 27.72 kg/m  , BMI Body mass index is 27.72 kg/m. GEN: Well nourished, well developed, in no acute distress  HEENT: normal  Neck: no JVD, carotid bruits, or masses Cardiac: Regular rate and rhythm; no murmurs, rubs, or gallops,no edema  Respiratory:  clear to auscultation bilaterally, normal work of breathing GI: soft, nontender, nondistended, + BS MS: no deformity or atrophy  Skin: warm and dry, no rash Neuro:  Strength and sensation are intact Psych: euthymic mood, full affect   EKG:  EKG is ordered today. The ekg ordered today demonstrates normal sinus rhythm with mildly prolonged QT interval and anterolateral T wave changes   Recent Labs: 09/03/2022: ALT 138 09/08/2022:  Magnesium 2.1 09/09/2022: BUN 15; Creatinine, Ser 1.08; Hemoglobin 9.7; Platelets 140; Potassium 3.9; Sodium 135    Lipid Panel    Component Value Date/Time   CHOL 246 (H) 03/23/2022 1105   TRIG 112.0 03/23/2022 1105   HDL 95.40 03/23/2022 1105   CHOLHDL 3 03/23/2022 1105   VLDL 22.4 03/23/2022 1105   LDLCALC 129 (H) 03/23/2022 1105      Wt Readings from Last 3 Encounters:  10/27/22 210 lb 2 oz (95.3 kg)  10/07/22 209 lb (94.8 kg)  09/17/22 210 lb (95.3 kg)          No data to display            ASSESSMENT AND PLAN:  1.  Postoperative atrial fibrillation: He is in sinus rhythm today and his symptoms have been well-controlled with metoprolol 25 mg twice daily.  No need for long-term anticoagulation.  2.  Status post surgical repair of ascending aortic aneurysm: He is progressing very well.  3.  Essential hypertension: He used to be on amlodipine and losartan.  Recommend continuing metoprolol for now and we can consider switching his antihypertensive medications in the future if needed.    Disposition:   FU with me in 6 months  Signed,  Lorine Bears, MD  10/27/2022 3:37 PM    [facility]

## 2022-10-27 NOTE — Patient Instructions (Signed)
Medication Instructions:  No changes *If you need a refill on your cardiac medications before your next appointment, please call your pharmacy*   Lab Work: None ordered If you have labs (blood work) drawn today and your tests are completely normal, you will receive your results only by: MyChart Message (if you have MyChart) OR A paper copy in the mail If you have any lab test that is abnormal or we need to change your treatment, we will call you to review the results.   Testing/Procedures: None ordered   Follow-Up: At [facility], you and your health needs are our priority.  As part of our continuing mission to provide you with exceptional heart care, we have created designated Provider Care Teams.  These Care Teams include your primary Cardiologist (physician) and Advanced Practice Providers (APPs -  Physician Assistants and Nurse Practitioners) who all work together to provide you with the care you need, when you need it.  We recommend signing up for the patient portal called "MyChart".  Sign up information is provided on this After Visit Summary.  MyChart is used to connect with patients for Virtual Visits (Telemedicine).  Patients are able to view lab/test results, encounter notes, upcoming appointments, etc.  Non-urgent messages can be sent to your provider as well.   To learn more about what you can do with MyChart, go to [email or web address].    Your next appointment:   6 month(s)  Provider:   You may see Muhammad Arida, MD or one of the following Advanced Practice Providers on your designated Care Team:   Christopher Berge, NP Ryan Dunn, PA-C Cadence Furth, PA-C Sheri Hammock, NP    

## 2022-11-04 ENCOUNTER — Ambulatory Visit (INDEPENDENT_AMBULATORY_CARE_PROVIDER_SITE_OTHER): Payer: Self-pay | Admitting: Surgery

## 2022-11-04 ENCOUNTER — Encounter: Payer: Self-pay | Admitting: Surgery

## 2022-11-04 ENCOUNTER — Other Ambulatory Visit: Payer: Self-pay

## 2022-11-04 VITALS — BP 168/95 | HR 70 | Resp 20 | Ht 73.0 in | Wt 214.0 lb

## 2022-11-04 DIAGNOSIS — Z09 Encounter for follow-up examination after completed treatment for conditions other than malignant neoplasm: Secondary | ICD-10-CM

## 2022-11-04 MED ORDER — AMOXICILLIN 500 MG PO CAPS
2000.0000 mg | ORAL_CAPSULE | Freq: Once | ORAL | 0 refills | Status: AC
  Filled 2022-11-04: qty 4, 1d supply, fill #0

## 2022-11-04 NOTE — Progress Notes (Addendum)
HPI:  Patient returns for routine postoperative follow-up having undergone supra-coronary replacement of the ascending aorta under deep hypothermic circulatory arrest on 09/07/2022.  Since I last saw him on 10/07/2022 he had a couple brief episodes of atrial fibrillation that resolved quickly and has had no recurrence.  He has been walking 4 to 5 miles at a time without difficulty.  His stamina is gradually improving.  He has had no further fevers or chills.  He has been checking his blood pressure at home periodically and is staying under good control.  Current Outpatient Medications  Medication Sig Dispense Refill   acetaminophen (TYLENOL) 325 MG tablet Take 2 tablets (650 mg total) by mouth every 6 (six) hours as needed. (Patient taking differently: Take 650 mg by mouth as needed for moderate pain.) 36 tablet 0   albuterol (VENTOLIN HFA) 108 (90 Base) MCG/ACT inhaler INHALE 2 PUFFS INTO THE LUNGS EVERY 6 (SIX) HOURS AS NEEDED FOR WHEEZING. (Patient taking differently: Inhale 2 puffs into the lungs as needed for wheezing or shortness of breath.) 20.1 g 3   ALPRAZolam (XANAX) 0.25 MG tablet Take 1 tablet (0.25 mg total) by mouth 2 (two) times daily as needed for anxiety or sleep. (Patient taking differently: Take 0.25 mg by mouth as needed for anxiety or sleep.) 60 tablet 3   aspirin EC 81 MG tablet Take 81 mg by mouth daily. Swallow whole.     budesonide-formoterol (SYMBICORT) 160-4.5 MCG/ACT inhaler Inhale 2 puffs into the lungs in the morning and at bedtime. (Patient taking differently: Inhale 2 puffs into the lungs daily.) 10.2 g 5   Carboxymethylcellul-Glycerin (LUBRICATING EYE DROPS OP) Apply 2 drops to eye daily as needed (dry eyes).     cetirizine (ZYRTEC) 10 MG tablet Take 10 mg by mouth daily.     diclofenac Sodium (VOLTAREN) 1 % GEL Apply 1 a small amount to affected area twice a day (Patient taking differently: Apply 1 Application topically daily as needed.) 100 g 0   fluticasone  (FLONASE) 50 MCG/ACT nasal spray Place 2 sprays into both nostrils daily. (Patient taking differently: Place 1 spray into both nostrils daily.) 16 g 6   ibuprofen (ADVIL) 200 MG tablet Take 400 mg by mouth every 8 (eight) hours as needed for moderate pain.     magnesium oxide (MAG-OX) 400 (240 Mg) MG tablet Take 400 mg by mouth daily.     metoprolol tartrate (LOPRESSOR) 25 MG tablet Take 1 tablet (25 mg total) by mouth 2 (two) times daily. 180 tablet 1   montelukast (SINGULAIR) 10 MG tablet Take 1 tablet (10 mg total) by mouth at bedtime. 30 tablet 5   Multiple Vitamin (MULTIVITAMIN WITH MINERALS) TABS tablet Take 1 tablet by mouth daily.     omeprazole (PRILOSEC) 40 MG capsule Take 1 capsule (40 mg total) by mouth daily. 90 capsule 3   ondansetron (ZOFRAN) 4 MG tablet Take 1 tablet (4 mg total) by mouth every 8 (eight) hours as needed for nausea or vomiting. (Patient taking differently: Take 4 mg by mouth as needed for nausea or vomiting.) 20 tablet 2   PRESCRIPTION MEDICATION Apply 1 Application topically as needed (rash on legs). Clobetasol Cream     promethazine (PHENERGAN) 25 MG suppository Place 1 suppository (25 mg total) rectally every 6 (six) hours as needed for nausea or vomiting. (Patient taking differently: Place 25 mg rectally as needed for nausea or vomiting.) 12 each 2   No current facility-administered medications for this visit.  Physical Exam: BP (!) 168/95   Pulse 70   Resp 20   Ht  (1.854 m)   Wt 214 lb (97.1 kg)   SpO2 97% Comment: RA  BMI 28.23 kg/m  He looks well. Cardiac exam shows a regular rate and rhythm with normal heart sounds.  There is no murmur. Lungs are clear. The chest incision is healing well and the sternum is stable. There is no peripheral edema.  Diagnostic Tests:  None today  Impression:  He is doing well 2 months following his surgery.  He has very motivated to continue progressive physical activity.  I asked him not to do any heavy  lifting for 3 months postoperatively.  He is planning on returning to work in June.  He is having some dental work done soon and I told him he should take amoxicillin 2 g p.o. before the procedure since he is fairly early after his surgery.  I sent this prescription to his pharmacy.  I do not think he will require long-term dental prophylaxis with his Dacron aortic graft.  Plan:  He will continue to follow-up with Dr. Kirke Corin and Dr. Patsy Lager.  I will plan to see him back in 1 year with a CTA of the chest for aortic surveillance.   Alleen Borne, MD [facility] [phone]

## 2022-11-04 NOTE — Addendum Note (Signed)
Addended by: Alleen Borne on: 11/04/2022 02:15 PM   Modules accepted: Orders

## 2022-11-05 ENCOUNTER — Ambulatory Visit
Admission: EM | Admit: 2022-11-05 | Discharge: 2022-11-05 | Disposition: A | Discharge status: Home / Self Care | Payer: 59

## 2022-11-05 DIAGNOSIS — E860 Dehydration: Secondary | ICD-10-CM | POA: Diagnosis not present

## 2022-11-05 DIAGNOSIS — R1110 Vomiting, unspecified: Secondary | ICD-10-CM | POA: Diagnosis not present

## 2022-11-05 DIAGNOSIS — R1032 Left lower quadrant pain: Secondary | ICD-10-CM | POA: Diagnosis not present

## 2022-11-05 NOTE — ED Notes (Signed)
Patient is being discharged from the Urgent Care and sent to the Emergency Department via POV . Per Wendee Beavers NP, patient is in need of higher level of care due to intractable vomiting/ LLQ abdominal pain/ dehydration. Patient is aware and verbalizes understanding of plan of care.  Vitals:   11/05/22 1459  BP: (!) 146/91  Pulse: 99  Resp: 18  Temp: 98.8 F (37.1 C)  SpO2: 99%

## 2022-11-05 NOTE — ED Triage Notes (Addendum)
Patient to Urgent Care with complaints of nausea/ vomiting that started this morning around 4am. Chills but no fevers.   Took zofran this morning with little relief. Then used a phenergan suppository around 5am then another around 11am. Reports having a total of around 45 minutes of relief.   Patient has been trying to force fluids, states he has been having a hard time keep down the fluids. Hx of diverticulitis. Started having some LLQ pain after vomiting. Concerned about dehydration.

## 2022-11-05 NOTE — Discharge Instructions (Signed)
Go to the emergency department

## 2022-11-05 NOTE — ED Provider Notes (Signed)
Austin Houston    CSN: [id number] Arrival date & time: 11/05/22  1412      History   Chief Complaint Chief Complaint  Patient presents with   Emesis    HPI Austin Houston is a 62 y.o. male.  Patient presents with nausea, vomiting, left lower quadrant abdominal pain, chills since 0400 this morning.  Treatment with Zofran and Phenergan suppositories without relief.  He states he has decreased urine output and feels dehydrated.  He reports history of diverticulitis.  His medical history includes aortic aneurysm repair on 09/07/2022, hypertension, GERD, prostate cancer, asthma.  The history is provided by the patient and medical records.    Past Medical History:  Diagnosis Date   Actinic keratosis    Allergic rhinitis due to pollen    Asthma    vs COPD- taking inhalers- as related to allergies only.   Basal cell carcinoma 03/26/2020   R lat deltoid - ED&C    Dysplastic nevus 03/07/2020   L costal infrapectoral - moderate   Dysplastic nevus 03/07/2020   R post flank above waistline - moderate   Follicular cancer of thyroid    s/p partial thyroidectomy (follicular adenoma)-surgery only   GERD (gastroesophageal reflux disease)    Hearing impaired person, bilateral    hearing aida bilateral"high frequency loss   Hyperlipidemia    Hypertension    Prostate cancer 01/08/2016   PTSD (post-traumatic stress disorder)    s/p multiple active deployments for [employer], no medications taken currently    Patient Active Problem List   Diagnosis Date Noted   S/P ascending aortic aneurysm repair 09/07/2022   Aneurysm of ascending aorta without rupture 07/20/2022   Mild persistent asthma without complication 01/24/2020   Prostate cancer 01/08/2016   Family history of prostate cancer in father 06/19/2014   Varicose veins of lower extremities with other complications 03/01/2013   Former smokeless tobacco use 11/25/2012   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HCC)    GERD  (gastroesophageal reflux disease)    Allergic rhinitis due to pollen    Hypertension    Hyperlipidemia    PTSD (post-traumatic stress disorder)     Past Surgical History:  Procedure Laterality Date   COLONOSCOPY W/ POLYPECTOMY     age 23 "adenoma"   INGUINAL HERNIA REPAIR     right   LEFT HEART CATH AND CORONARY ANGIOGRAPHY N/A 07/20/2022   Procedure: LEFT HEART CATH AND CORONARY ANGIOGRAPHY;  Surgeon: Tonny Bollman, MD;  Location: [facility] INVASIVE CV LAB;  Service: Cardiovascular;  Laterality: N/A;   REPLACEMENT ASCENDING AORTA N/A 09/07/2022   Procedure: REPLACEMENT ASCENDING AORTA;  Surgeon: Alleen Borne, MD;  Location: [facility] OR;  Service: Open Heart Surgery;  Laterality: N/A;  WITH CIRC ARREST   ROBOT ASSISTED LAPAROSCOPIC RADICAL PROSTATECTOMY N/A 07/23/2016   Procedure: XI ROBOTIC ASSISTED LAPAROSCOPIC RADICAL PROSTATECTOMY LEVEL 1;  Surgeon: Heloise Purpura, MD;  Location: [facility] ORS;  Service: Urology;  Laterality: N/A;   TEE WITHOUT CARDIOVERSION N/A 09/07/2022   Procedure: TRANSESOPHAGEAL ECHOCARDIOGRAM (TEE);  Surgeon: Alleen Borne, MD;  Location: [facility] OR;  Service: Open Heart Surgery;  Laterality: N/A;   THYROIDECTOMY, PARTIAL     [name])       Home Medications    Prior to Admission medications   Medication Sig Start Date End Date Taking? Authorizing Provider  acetaminophen (TYLENOL) 325 MG tablet Take 2 tablets (650 mg total) by mouth every 6 (six) hours as needed. Patient taking differently: Take 650 mg  by mouth as needed for moderate pain. 06/16/22   Tanda Rockers A, DO  albuterol (VENTOLIN HFA) 108 (90 Base) MCG/ACT inhaler INHALE 2 PUFFS INTO THE LUNGS EVERY 6 (SIX) HOURS AS NEEDED FOR WHEEZING. Patient taking differently: Inhale 2 puffs into the lungs as needed for wheezing or shortness of breath. 05/20/22 05/20/23  Copland, Karleen Hampshire, MD  ALPRAZolam Prudy Feeler) 0.25 MG tablet Take 1 tablet (0.25 mg total) by mouth 2 (two) times daily as needed for anxiety or sleep. Patient  taking differently: Take 0.25 mg by mouth as needed for anxiety or sleep. 05/27/22   Alleen Borne, MD  amoxicillin (AMOXIL) 500 MG capsule Take 4 capsules (2,000 mg total) by mouth once for 1 dose. 11/04/22 11/05/22  Alleen Borne, MD  aspirin EC 81 MG tablet Take 81 mg by mouth daily. Swallow whole.    [provider]  budesonide-formoterol (SYMBICORT) 160-4.5 MCG/ACT inhaler Inhale 2 puffs into the lungs in the morning and at bedtime. Patient taking differently: Inhale 2 puffs into the lungs daily. 08/25/21   Mannam, Colbert Coyer, MD  Carboxymethylcellul-Glycerin (LUBRICATING EYE DROPS OP) Apply 2 drops to eye daily as needed (dry eyes).    [provider]  cetirizine (ZYRTEC) 10 MG tablet Take 10 mg by mouth daily.    [provider]  diclofenac Sodium (VOLTAREN) 1 % GEL Apply 1 a small amount to affected area twice a day Patient taking differently: Apply 1 Application topically daily as needed. 01/01/22     fluticasone (FLONASE) 50 MCG/ACT nasal spray Place 2 sprays into both nostrils daily. Patient taking differently: Place 1 spray into both nostrils daily. 10/30/20   Jannifer Rodney A, FNP  ibuprofen (ADVIL) 200 MG tablet Take 400 mg by mouth every 8 (eight) hours as needed for moderate pain.    [provider]  magnesium oxide (MAG-OX) 400 (240 Mg) MG tablet Take 400 mg by mouth daily.    [provider]  metoprolol tartrate (LOPRESSOR) 25 MG tablet Take 1 tablet (25 mg total) by mouth 2 (two) times daily. 09/17/22   Iran Ouch, MD  montelukast (SINGULAIR) 10 MG tablet Take 1 tablet (10 mg total) by mouth at bedtime. 05/20/22   Chilton Greathouse, MD  Multiple Vitamin (MULTIVITAMIN WITH MINERALS) TABS tablet Take 1 tablet by mouth daily.    [provider]  omeprazole (PRILOSEC) 40 MG capsule Take 1 capsule (40 mg total) by mouth daily. 04/19/22   Copland, Karleen Hampshire, MD  ondansetron (ZOFRAN) 4 MG tablet Take 1 tablet (4 mg total) by mouth every 8  (eight) hours as needed for nausea or vomiting. Patient taking differently: Take 4 mg by mouth as needed for nausea or vomiting. 04/22/22   Copland, Karleen Hampshire, MD  PRESCRIPTION MEDICATION Apply 1 Application topically as needed (rash on legs). Clobetasol Cream    [provider]  promethazine (PHENERGAN) 25 MG suppository Place 1 suppository (25 mg total) rectally every 6 (six) hours as needed for nausea or vomiting. Patient taking differently: Place 25 mg rectally as needed for nausea or vomiting. 12/22/21   Copland, Karleen Hampshire, MD  atorvastatin (LIPITOR) 20 MG tablet Take 1 tablet (20 mg total) by mouth daily. 09/04/22 09/04/22  Nahser, Deloris Ping, MD  beclomethasone (QVAR) 80 MCG/ACT inhaler Inhale 2 puffs into the lungs daily. 04/27/18 12/19/19  [name], MD    Family History Family History  Problem Relation Age of Onset   Heart disease Paternal Grandmother    Prostate cancer Father  Recurrent x 3   Hyperlipidemia Father    Other Father        varicose veins   Hyperlipidemia Mother    Other Mother        varicose veins   Hypertension Maternal Grandmother    Hypertension Maternal Grandfather    Sudden death Brother 28   Colon cancer Neg Hx    Esophageal cancer Neg Hx    Rectal cancer Neg Hx    Stomach cancer Neg Hx     Social History Social History   Tobacco Use   Smoking status: Never   Smokeless tobacco: Former    Types: Snuff    Quit date: 12/03/2012  Vaping Use   Vaping Use: Never used  Substance Use Topics   Alcohol use: Yes    Alcohol/week: 10.0 standard drinks of alcohol    Types: 10 Cans of beer per week    Comment: moderate   Drug use: No     Allergies   Vioxx [rofecoxib], Celebrex [celecoxib], Codeine, and Doxycycline   Review of Systems Review of Systems  Constitutional:  Positive for chills. Negative for fever.  Respiratory:  Negative for cough and shortness of breath.   Cardiovascular:  Negative for chest pain and palpitations.   Gastrointestinal:  Positive for abdominal pain, nausea and vomiting. Negative for diarrhea.     Physical Exam Triage Vital Signs ED Triage Vitals  Enc Vitals Group     BP 11/05/22 1459 (!) 146/91     Pulse Rate 11/05/22 1459 99     Resp 11/05/22 1459 18     Temp 11/05/22 1459 98.8 F (37.1 C)     Temp src --      SpO2 11/05/22 1459 99 %     Weight --      Height --      Head Circumference --      Peak Flow --      Pain Score 11/05/22 1457 6     Pain Loc --      Pain Edu? --      Excl. in GC? --    No data found.  Updated Vital Signs BP (!) 146/91   Pulse 99   Temp 98.8 F (37.1 C)   Resp 18   SpO2 99%   Visual Acuity Right Eye Distance:   Left Eye Distance:   Bilateral Distance:    Right Eye Near:   Left Eye Near:    Bilateral Near:     Physical Exam Constitutional:      General: He is not in acute distress.    Appearance: Normal appearance. He is ill-appearing.  HENT:     Mouth/Throat:     Mouth: Mucous membranes are dry.  Cardiovascular:     Rate and Rhythm: Normal rate and regular rhythm.     Heart sounds: Normal heart sounds.  Pulmonary:     Effort: Pulmonary effort is normal. No respiratory distress.     Breath sounds: Normal breath sounds.  Abdominal:     General: Bowel sounds are normal.     Tenderness: There is generalized abdominal tenderness. There is no guarding or rebound.  Neurological:     Mental Status: He is alert.  Psychiatric:        Mood and Affect: Mood normal.        Behavior: Behavior normal.      UC Treatments / Results  Labs (all labs ordered are listed, but only abnormal results are displayed)  Labs Reviewed - No data to display  EKG   Radiology No results found.  Procedures Procedures (including critical care time)  Medications Ordered in UC Medications - No data to display  Initial Impression / Assessment and Plan / UC Course  I have reviewed the triage vital signs and the nursing notes.  Pertinent  labs & imaging results that were available during my care of the patient were reviewed by me and considered in my medical decision making (see chart for details).    Intractable vomiting, left lower quadrant abdominal pain, dehydration.  Patient is unable to keep fluids down due to intractable vomiting.  He has decreased urine output and feels dehydrated.  He also has left lower quadrant abdominal pain with history of diverticulitis.  Sending him to the ED for evaluation.  He is agreeable to this.  He feels stable for transport to the ED without EMS.  Final Clinical Impressions(s) / UC Diagnoses   Final diagnoses:  Intractable vomiting  Left lower quadrant abdominal pain  Dehydration     Discharge Instructions      Go to the emergency department.       ED Prescriptions   None    PDMP not reviewed this encounter.   Mickie Bail, NP 11/05/22 [phone]

## 2022-11-07 ENCOUNTER — Other Ambulatory Visit: Payer: Self-pay

## 2022-11-08 ENCOUNTER — Other Ambulatory Visit: Payer: Self-pay

## 2022-11-09 ENCOUNTER — Other Ambulatory Visit: Payer: Self-pay

## 2022-11-11 ENCOUNTER — Ambulatory Visit: Payer: 59 | Admitting: Surgery

## 2022-11-12 ENCOUNTER — Ambulatory Visit (INDEPENDENT_AMBULATORY_CARE_PROVIDER_SITE_OTHER): Payer: 59 | Admitting: Dermatology

## 2022-11-12 ENCOUNTER — Encounter: Payer: Self-pay | Admitting: Dermatology

## 2022-11-12 VITALS — BP 160/90 | HR 68

## 2022-11-12 DIAGNOSIS — X32XXXA Exposure to sunlight, initial encounter: Secondary | ICD-10-CM | POA: Diagnosis not present

## 2022-11-12 DIAGNOSIS — L578 Other skin changes due to chronic exposure to nonionizing radiation: Secondary | ICD-10-CM | POA: Diagnosis not present

## 2022-11-12 DIAGNOSIS — L821 Other seborrheic keratosis: Secondary | ICD-10-CM | POA: Diagnosis not present

## 2022-11-12 DIAGNOSIS — W908XXA Exposure to other nonionizing radiation, initial encounter: Secondary | ICD-10-CM | POA: Diagnosis not present

## 2022-11-12 DIAGNOSIS — L814 Other melanin hyperpigmentation: Secondary | ICD-10-CM

## 2022-11-12 DIAGNOSIS — L570 Actinic keratosis: Secondary | ICD-10-CM

## 2022-11-12 NOTE — Progress Notes (Signed)
   Follow-Up Visit   Subjective  Austin Houston is a 62 y.o. male who presents for the following: 6 month ak follow up, Reports did use 5 f/u calcipotriene to suggested areas of face in the fall and did get very red and inflamed. Did have a few spots today at left temple/forehead, left jaw and above left eye he would like checked.    The following portions of the chart were reviewed this encounter and updated as appropriate: medications, allergies, medical history  Review of Systems:  No other skin or systemic complaints except as noted in HPI or Assessment and Plan.  Objective  Well appearing patient in no apparent distress; mood and affect are within normal limits.  A focused examination was performed of the following areas: face  Relevant exam findings are noted in the Assessment and Plan.  left face x 2 (2) Erythematous thin papules/macules with gritty scale.     Assessment & Plan   Actinic keratosis (2) left face x 2  Actinic keratoses are precancerous spots that appear secondary to cumulative UV radiation exposure/sun exposure over time. They are chronic with expected duration over 1 year. A portion of actinic keratoses will progress to squamous cell carcinoma of the skin. It is not possible to reliably predict which spots will progress to skin cancer and so treatment is recommended to prevent development of skin cancer.  Recommend daily broad spectrum sunscreen SPF 30+ to sun-exposed areas, reapply every 2 hours as needed.  Recommend staying in the shade or wearing long sleeves, sun glasses (UVA+UVB protection) and wide brim hats (4-inch brim around the entire circumference of the hat). Call for new or changing lesions.  Destruction of lesion - left face x 2 Complexity: simple   Destruction method: cryotherapy   Informed consent: discussed and consent obtained   Timeout:  patient name, date of birth, surgical site, and procedure verified Lesion destroyed using liquid  nitrogen: Yes   Region frozen until ice ball extended beyond lesion: Yes   Outcome: patient tolerated procedure well with no complications   Post-procedure details: wound care instructions given    LENTIGINES Exam: scattered tan macules Due to sun exposure Treatment Plan: Benign-appearing, observe. Recommend daily broad spectrum sunscreen SPF 30+ to sun-exposed areas, reapply every 2 hours as needed.  Call for any changes  SEBORRHEIC KERATOSIS - Stuck-on, waxy, tan-brown papules and/or plaques  - Benign-appearing - Discussed benign etiology and prognosis. - Observe - Call for any changes  ACTINIC DAMAGE - chronic, secondary to cumulative UV radiation exposure/sun exposure over time - diffuse scaly erythematous macules with underlying dyspigmentation - Recommend daily broad spectrum sunscreen SPF 30+ to sun-exposed areas, reapply every 2 hours as needed.  - Recommend staying in the shade or wearing long sleeves, sun glasses (UVA+UVB protection) and wide brim hats (4-inch brim around the entire circumference of the hat). - Call for new or changing lesions.    Return for 6 - 12 month ak and tbse in october .  IAsher Muir, CMA, am acting as scribe for Armida Sans, MD.   Documentation: I have reviewed the above documentation for accuracy and completeness, and I agree with the above.  Armida Sans, MD

## 2022-11-12 NOTE — Patient Instructions (Addendum)
Actinic keratoses are precancerous spots that appear secondary to cumulative UV radiation exposure/sun exposure over time. They are chronic with expected duration over 1 year. A portion of actinic keratoses will progress to squamous cell carcinoma of the skin. It is not possible to reliably predict which spots will progress to skin cancer and so treatment is recommended to prevent development of skin cancer.  Recommend daily broad spectrum sunscreen SPF 30+ to sun-exposed areas, reapply every 2 hours as needed.  Recommend staying in the shade or wearing long sleeves, sun glasses (UVA+UVB protection) and wide brim hats (4-inch brim around the entire circumference of the hat). Call for new or changing lesions.   Cryotherapy Aftercare  Wash gently with soap and water everyday.   Apply Vaseline and Band-Aid daily until healed.   Seborrheic Keratosis  What causes seborrheic keratoses? Seborrheic keratoses are harmless, common skin growths that first appear during adult life.  As time goes by, more growths appear.  Some people may develop a large number of them.  Seborrheic keratoses appear on both covered and uncovered body parts.  They are not caused by sunlight.  The tendency to develop seborrheic keratoses can be inherited.  They vary in color from skin-colored to gray, brown, or even black.  They can be either smooth or have a rough, warty surface.   Seborrheic keratoses are superficial and look as if they were stuck on the skin.  Under the microscope this type of keratosis looks like layers upon layers of skin.  That is why at times the top layer may seem to fall off, but the rest of the growth remains and re-grows.    Treatment Seborrheic keratoses do not need to be treated, but can easily be removed in the office.  Seborrheic keratoses often cause symptoms when they rub on clothing or jewelry.  Lesions can be in the way of shaving.  If they become inflamed, they can cause itching, soreness, or  burning.  Removal of a seborrheic keratosis can be accomplished by freezing, burning, or surgery. If any spot bleeds, scabs, or grows rapidly, please return to have it checked, as these can be an indication of a skin cancer.          Due to recent changes in healthcare laws, you may see results of your pathology and/or laboratory studies on MyChart before the doctors have had a chance to review them. We understand that in some cases there may be results that are confusing or concerning to you. Please understand that not all results are received at the same time and often the doctors may need to interpret multiple results in order to provide you with the best plan of care or course of treatment. Therefore, we ask that you please give us 2 business days to thoroughly review all your results before contacting the office for clarification. Should we see a critical lab result, you will be contacted sooner.   If You Need Anything After Your Visit  If you have any questions or concerns for your doctor, please call our main line at [phone] and press option 4 to reach your doctor's medical assistant. If no one answers, please leave a voicemail as directed and we will return your call as soon as possible. Messages left after 4 pm will be answered the following business day.   You may also send us a message via MyChart. We typically respond to MyChart messages within 1-2 business days.  For prescription refills, please ask your pharmacy   to contact our office. Our fax number is [phone].  If you have an urgent issue when the clinic is closed that cannot wait until the next business day, you can page your doctor at the number below.    Please note that while we do our best to be available for urgent issues outside of office hours, we are not available 24/7.   If you have an urgent issue and are unable to reach us, you may choose to seek medical care at your doctor's office, retail clinic,  urgent care center, or emergency room.  If you have a medical emergency, please immediately call 911 or go to the emergency department.  Pager Numbers  - Dr. Kowalski: [phone]  - Dr. Moye: [phone]  - Dr. Stewart: [phone]  In the event of inclement weather, please call our main line at [phone] for an update on the status of any delays or closures.  Dermatology Medication Tips: Please keep the boxes that topical medications come in in order to help keep track of the instructions about where and how to use these. Pharmacies typically print the medication instructions only on the boxes and not directly on the medication tubes.   If your medication is too expensive, please contact our office at [phone] option 4 or send us a message through MyChart.   We are unable to tell what your co-pay for medications will be in advance as this is different depending on your insurance coverage. However, we may be able to find a substitute medication at lower cost or fill out paperwork to get insurance to cover a needed medication.   If a prior authorization is required to get your medication covered by your insurance company, please allow us 1-2 business days to complete this process.  Drug prices often vary depending on where the prescription is filled and some pharmacies may offer cheaper prices.  The website www.goodrx.com contains coupons for medications through different pharmacies. The prices here do not account for what the cost may be with help from insurance (it may be cheaper with your insurance), but the website can give you the price if you did not use any insurance.  - You can print the associated coupon and take it with your prescription to the pharmacy.  - You may also stop by our office during regular business hours and pick up a GoodRx coupon card.  - If you need your prescription sent electronically to a different pharmacy, notify our office through [place]  MyChart or by phone at [phone] option 4.     Si Usted Necesita Algo Despus de Su Visita  Tambin puede enviarnos un mensaje a travs de MyChart. Por lo general respondemos a los mensajes de MyChart en el transcurso de 1 a 2 das hbiles.  Para renovar recetas, por favor pida a su farmacia que se ponga en contacto con nuestra oficina. Nuestro nmero de fax es el [phone].  Si tiene un asunto urgente cuando la clnica est cerrada y que no puede esperar hasta el siguiente da hbil, puede llamar/localizar a su doctor(a) al nmero que aparece a continuacin.   Por favor, tenga en cuenta que aunque hacemos todo lo posible para estar disponibles para asuntos urgentes fuera del horario de oficina, no estamos disponibles las 24 horas del da, los 7 das de la semana.   Si tiene un problema urgente y no puede comunicarse con nosotros, puede optar por buscar atencin mdica  en el consultorio de su doctor(a), en una   clnica privada, en un centro de atencin urgente o en una sala de emergencias.  Si tiene una emergencia mdica, por favor llame inmediatamente al 911 o vaya a la sala de emergencias.  Nmeros de bper  - Dr. Kowalski: [phone]  - Dra. Moye: [phone]  - Dra. Stewart: [phone]  En caso de inclemencias del tiempo, por favor llame a nuestra lnea principal al [phone] para una actualizacin sobre el estado de cualquier retraso o cierre.  Consejos para la medicacin en dermatologa: Por favor, guarde las cajas en las que vienen los medicamentos de uso tpico para ayudarle a seguir las instrucciones sobre dnde y cmo usarlos. Las farmacias generalmente imprimen las instrucciones del medicamento slo en las cajas y no directamente en los tubos del medicamento.   Si su medicamento es muy caro, por favor, pngase en contacto con nuestra oficina llamando al [phone] y presione la opcin 4 o envenos un mensaje a travs de MyChart.   No podemos decirle cul  ser su copago por los medicamentos por adelantado ya que esto es diferente dependiendo de la cobertura de su seguro. Sin embargo, es posible que podamos encontrar un medicamento sustituto a menor costo o llenar un formulario para que el seguro cubra el medicamento que se considera necesario.   Si se requiere una autorizacin previa para que su compaa de seguros cubra su medicamento, por favor permtanos de 1 a 2 das hbiles para completar este proceso.  Los precios de los medicamentos varan con frecuencia dependiendo del lugar de dnde se surte la receta y alguna farmacias pueden ofrecer precios ms baratos.  El sitio web www.goodrx.com tiene cupones para medicamentos de diferentes farmacias. Los precios aqu no tienen en cuenta lo que podra costar con la ayuda del seguro (puede ser ms barato con su seguro), pero el sitio web puede darle el precio si no utiliz ningn seguro.  - Puede imprimir el cupn correspondiente y llevarlo con su receta a la farmacia.  - Tambin puede pasar por nuestra oficina durante el horario de atencin regular y recoger una tarjeta de cupones de GoodRx.  - Si necesita que su receta se enve electrnicamente a una farmacia diferente, informe a nuestra oficina a travs de MyChart de [place] o por telfono llamando al [phone] y presione la opcin 4.  

## 2022-11-18 ENCOUNTER — Encounter: Payer: Self-pay | Admitting: Dermatology

## 2022-12-03 ENCOUNTER — Ambulatory Visit: Payer: 59

## 2022-12-29 ENCOUNTER — Other Ambulatory Visit: Payer: Self-pay

## 2022-12-30 ENCOUNTER — Other Ambulatory Visit: Payer: Self-pay | Admitting: Cardiovascular Disease

## 2022-12-30 ENCOUNTER — Other Ambulatory Visit: Payer: Self-pay

## 2022-12-30 MED ORDER — METOPROLOL TARTRATE 25 MG PO TABS
25.0000 mg | ORAL_TABLET | Freq: Two times a day (BID) | ORAL | 3 refills | Status: DC
  Filled 2022-12-30: qty 180, 90d supply, fill #0

## 2023-01-04 DIAGNOSIS — F1010 Alcohol abuse, uncomplicated: Secondary | ICD-10-CM | POA: Diagnosis not present

## 2023-01-13 ENCOUNTER — Other Ambulatory Visit: Payer: Self-pay | Admitting: Pulmonary Disease

## 2023-01-14 ENCOUNTER — Other Ambulatory Visit: Payer: Self-pay | Admitting: Pulmonary Disease

## 2023-01-14 ENCOUNTER — Other Ambulatory Visit: Payer: Self-pay

## 2023-01-15 ENCOUNTER — Other Ambulatory Visit: Payer: Self-pay

## 2023-01-18 ENCOUNTER — Other Ambulatory Visit: Payer: Self-pay

## 2023-01-18 ENCOUNTER — Other Ambulatory Visit: Payer: Self-pay | Admitting: Pulmonary Disease

## 2023-01-19 ENCOUNTER — Other Ambulatory Visit: Payer: Self-pay

## 2023-01-19 MED FILL — Montelukast Sodium Tab 10 MG (Base Equiv): ORAL | 30 days supply | Qty: 30 | Fill #0 | Status: AC

## 2023-02-10 ENCOUNTER — Telehealth (HOSPITAL_COMMUNITY): Payer: Self-pay | Admitting: Licensed Clinical Social Worker

## 2023-02-10 NOTE — Telephone Encounter (Signed)
The therapist receives a return call from "Austin Houston" confirming his identity via two identifiers. He says that he started seen a virtual provider a little over a month ago who prescribed him Naltrexone for his alcohol use. This provider is apparently an Administrator, arts.  Austin Houston says that he was not as compliant as he should have been the first three weeks due to side effects but is now taking a half BID. He says that the Naltrexone has "curbed" his drinking reducing it in half.  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 He indicates that he wants to get his drinking under control mainly as it has affected his relationship with his wife. He has an appointment with [facility] tomorrow having asked to meet specifically with a provider with expertise in addiction.   Prior to naltrexone, he was drinking a  half 5th per day of vodka or beer and vodka since October or November of last year. Before that, he describes his drinking as being "on and off" a few weeks regarding binge drinking. While reducing his drinking with the Naltrexone, he says that he has had some withdrawal  in the form of tremors and sweating so will take a shot of liquor when this occurs. He says that he has had three shots of vodka today. He says that he would typically also have a beer with dinner as his wife has a glass of wine with dinner; however, he is feeling so poorly today that he plans on forgoing the beer.   Austin Houston says that he had open heart surgery around the end of February this year and did not drink for 5 weeks. He was prescribed Xanax in November of last year by   Dr. Laneta Simmers for anxiety due to stress over an issue with a grandchild; however, he says that Dr. Laneta Simmers was not aware of his history of problematic alcohol use.   At present, his primary goal is reducing his drinking with the long-term goal of complete abstinence; however, he admits that he is "not that  emotionally committed yet." The therapist explains to him the risks of ETOH withdrawal recommending he seek inpatient detox. The therapist also educates him on the substance use continuum of care.  Austin Houston will call this therapist back on a p.r.n. basis with Austin Houston knowing how to access 24/7 detox at the [facility].  Myrna Blazer, MA, LCSW, [facility], LCAS 02/10/2023

## 2023-02-10 NOTE — Telephone Encounter (Signed)
Reyn, who says he goes by Kellogg," leaves a voicemail saying that he is self-referred for help with ETOH and that he is trying to "wean" himself off Naltrexone. He says that he has been receiving care from a virtual provider; however, is apparently interested in in person outpatient treatment.  The therapist returns his call leaving a HIPAA-compliant voicemail with his direct callback numbers.  Myrna Blazer, MA, LCSW, [facility], LCAS 02/10/2023

## 2023-02-15 ENCOUNTER — Ambulatory Visit (INDEPENDENT_AMBULATORY_CARE_PROVIDER_SITE_OTHER)
Admission: EM | Admit: 2023-02-15 | Discharge: 2023-02-15 | Disposition: A | Discharge status: Other Acute Inpatient Hospital | Payer: 59 | Source: Home / Self Care

## 2023-02-15 ENCOUNTER — Other Ambulatory Visit (INDEPENDENT_AMBULATORY_CARE_PROVIDER_SITE_OTHER)
Admission: EM | Admit: 2023-02-15 | Discharge: 2023-02-17 | Disposition: A | Discharge status: Other Acute Inpatient Hospital | Payer: 59 | Source: Home / Self Care | Admitting: Registered Nurse

## 2023-02-15 ENCOUNTER — Encounter (HOSPITAL_COMMUNITY): Payer: Self-pay | Admitting: Registered Nurse

## 2023-02-15 DIAGNOSIS — Z87891 Personal history of nicotine dependence: Secondary | ICD-10-CM | POA: Insufficient documentation

## 2023-02-15 DIAGNOSIS — F4310 Post-traumatic stress disorder, unspecified: Secondary | ICD-10-CM

## 2023-02-15 DIAGNOSIS — F1020 Alcohol dependence, uncomplicated: Secondary | ICD-10-CM | POA: Diagnosis present

## 2023-02-15 DIAGNOSIS — F10930 Alcohol use, unspecified with withdrawal, uncomplicated: Secondary | ICD-10-CM | POA: Diagnosis not present

## 2023-02-15 DIAGNOSIS — F10230 Alcohol dependence with withdrawal, uncomplicated: Secondary | ICD-10-CM | POA: Insufficient documentation

## 2023-02-15 DIAGNOSIS — I444 Left anterior fascicular block: Secondary | ICD-10-CM | POA: Insufficient documentation

## 2023-02-15 DIAGNOSIS — F10231 Alcohol dependence with withdrawal delirium: Secondary | ICD-10-CM | POA: Diagnosis not present

## 2023-02-15 DIAGNOSIS — F10939 Alcohol use, unspecified with withdrawal, unspecified: Secondary | ICD-10-CM | POA: Diagnosis present

## 2023-02-15 DIAGNOSIS — Z6379 Other stressful life events affecting family and household: Secondary | ICD-10-CM | POA: Insufficient documentation

## 2023-02-15 DIAGNOSIS — F1010 Alcohol abuse, uncomplicated: Secondary | ICD-10-CM | POA: Diagnosis present

## 2023-02-15 LAB — COMPREHENSIVE METABOLIC PANEL
ALT: 132 U/L — ABNORMAL HIGH (ref 0–44)
AST: 237 U/L — ABNORMAL HIGH (ref 15–41)
Albumin: 4.1 g/dL (ref 3.5–5.0)
Alkaline Phosphatase: 69 U/L (ref 38–126)
Anion gap: 22 — ABNORMAL HIGH (ref 5–15)
BUN: 9 mg/dL (ref 6–20)
CO2: 22 mmol/L (ref 22–32)
Calcium: 9.8 mg/dL (ref 8.9–10.3)
Chloride: 93 mmol/L — ABNORMAL LOW (ref 98–111)
Creatinine, Ser: 0.98 mg/dL (ref 0.61–1.24)
GFR, Estimated: >60 mL/min (ref >60)
Glucose, Bld: 175 mg/dL — ABNORMAL HIGH (ref 70–99)
Potassium: 4.1 mmol/L (ref 3.5–5.1)
Sodium: 137 mmol/L (ref 135–145)
Total Bilirubin: 1 mg/dL (ref 0.3–1.2)
Total Protein: 7.6 g/dL (ref 6.5–8.1)

## 2023-02-15 LAB — ETHANOL
Alcohol, Ethyl (B): 350 mg/dL (ref <10)
Alcohol, Ethyl (B): 155 mg/dL — ABNORMAL HIGH (ref <10)

## 2023-02-15 LAB — CBC WITH DIFFERENTIAL/PLATELET
Abs Immature Granulocytes: 0.02 10*3/uL (ref 0.00–0.07)
Basophils Absolute: 0 10*3/uL (ref 0.0–0.1)
Basophils Relative: 1 %
Eosinophils Absolute: 0 10*3/uL (ref 0.0–0.5)
Eosinophils Relative: 0 %
HCT: 46.9 % (ref 39.0–52.0)
Hemoglobin: 15.9 g/dL (ref 13.0–17.0)
Immature Granulocytes: 1 %
Lymphocytes Relative: 15 %
Lymphs Abs: 0.5 10*3/uL — ABNORMAL LOW (ref 0.7–4.0)
MCH: 30.4 pg (ref 26.0–34.0)
MCHC: 33.9 g/dL (ref 30.0–36.0)
MCV: 89.7 fL (ref 80.0–100.0)
Monocytes Absolute: 0.4 10*3/uL (ref 0.1–1.0)
Monocytes Relative: 12 %
Neutro Abs: 2.4 10*3/uL (ref 1.7–7.7)
Neutrophils Relative %: 71 %
Platelets: 173 10*3/uL (ref 150–400)
RBC: 5.23 MIL/uL (ref 4.22–5.81)
RDW: 14.9 % (ref 11.5–15.5)
WBC: 3.4 10*3/uL — ABNORMAL LOW (ref 4.0–10.5)
nRBC: 0 % (ref 0.0–0.2)

## 2023-02-15 LAB — URINALYSIS, ROUTINE W REFLEX MICROSCOPIC
Bacteria, UA: NONE SEEN
Bilirubin Urine: NEGATIVE
Glucose, UA: NEGATIVE mg/dL
Ketones, ur: 20 mg/dL — AB
Leukocytes,Ua: NEGATIVE
Nitrite: NEGATIVE
Protein, ur: 100 mg/dL — AB
Specific Gravity, Urine: 1.015 (ref 1.005–1.030)
pH: 5 (ref 5.0–8.0)

## 2023-02-15 LAB — POCT URINE DRUG SCREEN - MANUAL ENTRY (I-SCREEN)
POC Amphetamine UR: NOT DETECTED
POC Buprenorphine (BUP): NOT DETECTED
POC Cocaine UR: NOT DETECTED
POC Marijuana UR: NOT DETECTED
POC Methadone UR: NOT DETECTED
POC Methamphetamine UR: NOT DETECTED
POC Morphine: NOT DETECTED
POC Oxazepam (BZO): POSITIVE — AB
POC Oxycodone UR: POSITIVE — AB
POC Secobarbital (BAR): NOT DETECTED

## 2023-02-15 LAB — HEMOGLOBIN A1C
Hgb A1c MFr Bld: 5.5 % (ref 4.8–5.6)
Mean Plasma Glucose: 111.15 mg/dL

## 2023-02-15 LAB — LIPID PANEL
Cholesterol: 313 mg/dL — ABNORMAL HIGH (ref 0–200)
HDL: 125 mg/dL (ref >40)
LDL Cholesterol: 166 mg/dL — ABNORMAL HIGH (ref 0–99)
Total CHOL/HDL Ratio: 2.5 RATIO
Triglycerides: 109 mg/dL (ref <150)
VLDL: 22 mg/dL (ref 0–40)

## 2023-02-15 LAB — TSH: TSH: 3.666 u[IU]/mL (ref 0.350–4.500)

## 2023-02-15 LAB — MAGNESIUM: Magnesium: 1.7 mg/dL (ref 1.7–2.4)

## 2023-02-15 MED ORDER — THIAMINE HCL 100 MG/ML IJ SOLN: 100.0000 mg | Freq: Once | INTRAMUSCULAR | Status: AC

## 2023-02-15 MED ORDER — LOPERAMIDE HCL 2 MG PO CAPS: 2.0000 mg | ORAL_CAPSULE | ORAL | Status: DC | PRN

## 2023-02-15 MED ORDER — MAGNESIUM HYDROXIDE 400 MG/5ML PO SUSP: 30.0000 mL | Freq: Every day | ORAL | Status: DC | PRN

## 2023-02-15 MED ORDER — THIAMINE HCL 100 MG/ML IJ SOLN
100.0000 mg | Freq: Once | INTRAMUSCULAR | Status: AC
  Administered 2023-02-15: 100 mg via INTRAMUSCULAR
  Filled 2023-02-15: qty 2

## 2023-02-15 MED ORDER — OLANZAPINE 10 MG PO TBDP: 10.0000 mg | ORAL_TABLET | Freq: Three times a day (TID) | ORAL | Status: DC | PRN

## 2023-02-15 MED ORDER — ALBUTEROL SULFATE HFA 108 (90 BASE) MCG/ACT IN AERS: 2.0000 | INHALATION_SPRAY | Freq: Four times a day (QID) | RESPIRATORY_TRACT | Status: DC | PRN

## 2023-02-15 MED ORDER — LORAZEPAM 1 MG PO TABS: 1.0000 mg | ORAL_TABLET | Freq: Three times a day (TID) | ORAL | Status: DC

## 2023-02-15 MED ORDER — POLYVINYL ALCOHOL 1.4 % OP SOLN: 2.0000 [drp] | Freq: Every day | OPHTHALMIC | Status: DC | PRN

## 2023-02-15 MED ORDER — ASPIRIN 81 MG PO TBEC
81.0000 mg | DELAYED_RELEASE_TABLET | Freq: Every day | ORAL | Status: DC
  Administered 2023-02-15: 81 mg via ORAL
  Filled 2023-02-15: qty 1

## 2023-02-15 MED ORDER — LORAZEPAM 1 MG PO TABS
1.0000 mg | ORAL_TABLET | Freq: Four times a day (QID) | ORAL | Status: DC | PRN
  Filled 2023-02-15: qty 1

## 2023-02-15 MED ORDER — PANTOPRAZOLE SODIUM 40 MG PO TBEC
40.0000 mg | DELAYED_RELEASE_TABLET | Freq: Every day | ORAL | Status: DC
  Administered 2023-02-15: 40 mg via ORAL
  Filled 2023-02-15: qty 1

## 2023-02-15 MED ORDER — LORAZEPAM 1 MG PO TABS: 1.0000 mg | ORAL_TABLET | Freq: Every day | ORAL | Status: DC

## 2023-02-15 MED ORDER — LORAZEPAM 1 MG PO TABS: 1.0000 mg | ORAL_TABLET | Freq: Two times a day (BID) | ORAL | Status: DC

## 2023-02-15 MED ORDER — THIAMINE MONONITRATE 100 MG PO TABS: 100.0000 mg | ORAL_TABLET | Freq: Every day | ORAL | Status: DC

## 2023-02-15 MED ORDER — DICLOFENAC SODIUM 1 % EX GEL: 2.0000 g | Freq: Every day | CUTANEOUS | Status: DC | PRN

## 2023-02-15 MED ORDER — ADULT MULTIVITAMIN W/MINERALS CH: 1.0000 | ORAL_TABLET | Freq: Every day | ORAL | Status: DC

## 2023-02-15 MED ORDER — METOPROLOL TARTRATE 25 MG PO TABS
25.0000 mg | ORAL_TABLET | Freq: Two times a day (BID) | ORAL | Status: DC
  Administered 2023-02-15: 25 mg via ORAL
  Filled 2023-02-15: qty 1

## 2023-02-15 MED ORDER — PROMETHAZINE HCL 25 MG/ML IJ SOLN
25.0000 mg | Freq: Once | INTRAMUSCULAR | Status: AC
  Administered 2023-02-15: 25 mg via INTRAMUSCULAR
  Filled 2023-02-15: qty 1

## 2023-02-15 MED ORDER — ADULT MULTIVITAMIN W/MINERALS CH
1.0000 | ORAL_TABLET | Freq: Every day | ORAL | Status: DC
  Administered 2023-02-15: 1 via ORAL
  Filled 2023-02-15: qty 1

## 2023-02-15 MED ORDER — HYDROXYZINE HCL 25 MG PO TABS: 25.0000 mg | ORAL_TABLET | Freq: Four times a day (QID) | ORAL | Status: DC | PRN

## 2023-02-15 MED ORDER — ACETAMINOPHEN 325 MG PO TABS: 650.0000 mg | ORAL_TABLET | Freq: Four times a day (QID) | ORAL | Status: DC | PRN

## 2023-02-15 MED ORDER — LORAZEPAM 1 MG PO TABS
1.0000 mg | ORAL_TABLET | Freq: Four times a day (QID) | ORAL | Status: AC
  Administered 2023-02-15 – 2023-02-16 (×4): 1 mg via ORAL
  Filled 2023-02-15 (×4): qty 1

## 2023-02-15 MED ORDER — MOMETASONE FURO-FORMOTEROL FUM 200-5 MCG/ACT IN AERO
2.0000 | INHALATION_SPRAY | Freq: Two times a day (BID) | RESPIRATORY_TRACT | Status: DC
  Administered 2023-02-15: 2 via RESPIRATORY_TRACT
  Filled 2023-02-15: qty 8.8

## 2023-02-15 MED ORDER — HYDROXYZINE HCL 25 MG PO TABS
25.0000 mg | ORAL_TABLET | Freq: Four times a day (QID) | ORAL | Status: DC | PRN
  Administered 2023-02-16: 25 mg via ORAL
  Filled 2023-02-15 (×2): qty 1

## 2023-02-15 MED ORDER — LORAZEPAM 1 MG PO TABS
1.0000 mg | ORAL_TABLET | Freq: Four times a day (QID) | ORAL | Status: DC
  Administered 2023-02-15 (×3): 1 mg via ORAL
  Filled 2023-02-15 (×3): qty 1

## 2023-02-15 MED ORDER — ALUM & MAG HYDROXIDE-SIMETH 200-200-20 MG/5ML PO SUSP: 30.0000 mL | ORAL | Status: DC | PRN

## 2023-02-15 MED ORDER — ONDANSETRON 4 MG PO TBDP
4.0000 mg | ORAL_TABLET | Freq: Four times a day (QID) | ORAL | Status: DC | PRN
  Administered 2023-02-15: 4 mg via ORAL
  Filled 2023-02-15: qty 1

## 2023-02-15 MED ORDER — LORAZEPAM 1 MG PO TABS: 1.0000 mg | ORAL_TABLET | Freq: Four times a day (QID) | ORAL | Status: DC | PRN

## 2023-02-15 MED ORDER — LORAZEPAM 1 MG PO TABS
1.0000 mg | ORAL_TABLET | ORAL | Status: AC | PRN
  Administered 2023-02-16: 1 mg via ORAL

## 2023-02-15 MED ORDER — FLUTICASONE PROPIONATE 50 MCG/ACT NA SUSP
1.0000 | Freq: Every day | NASAL | Status: DC
  Administered 2023-02-15: 1 via NASAL
  Filled 2023-02-15: qty 16

## 2023-02-15 MED ORDER — LORATADINE 10 MG PO TABS
10.0000 mg | ORAL_TABLET | Freq: Every day | ORAL | Status: DC
  Administered 2023-02-15: 10 mg via ORAL
  Filled 2023-02-15: qty 1

## 2023-02-15 MED ORDER — MAGNESIUM OXIDE -MG SUPPLEMENT 400 (240 MG) MG PO TABS
400.0000 mg | ORAL_TABLET | Freq: Every day | ORAL | Status: DC
  Administered 2023-02-15: 400 mg via ORAL
  Filled 2023-02-15: qty 1

## 2023-02-15 MED ORDER — LORAZEPAM 1 MG PO TABS
1.0000 mg | ORAL_TABLET | Freq: Three times a day (TID) | ORAL | Status: DC
  Administered 2023-02-16 – 2023-02-17 (×2): 1 mg via ORAL
  Filled 2023-02-15 (×2): qty 1

## 2023-02-15 MED ORDER — ZIPRASIDONE MESYLATE 20 MG IM SOLR: 20.0000 mg | INTRAMUSCULAR | Status: DC | PRN

## 2023-02-15 NOTE — ED Notes (Signed)
Patient A&Ox4. Patient present to [facility] with alcohol use disorder  seeking detox. Patient denies SI/HI and AVH. Patient denies any physical complaints when asked. No acute distress noted. Support and encouragement provided. Routine safety checks conducted according to facility protocol. Encouraged patient to notify staff if thoughts of harm toward self or others arise. Patient verbalize understanding and agreement. Will continue to monitor for safety.

## 2023-02-15 NOTE — ED Notes (Signed)
Patient resting quietly in bed with eyes closed. Respirations equal and unlabored, skin warm and dry, NAD. Routine safety checks conducted according to facility protocol. Will continue to monitor for safety.  

## 2023-02-15 NOTE — ED Notes (Signed)
Shuvon Rankins, NP informed of patient condition and new orders received.

## 2023-02-15 NOTE — ED Notes (Signed)
New patient admitted from [facility] for detox on ETOH. On admission, patient A/Ox 4. MAE but unsteady. Oriented to the unit and unit rules. During assessment, RN found bruises on both arms. Pt stated he hit his arm against a wall yesterday.  Denies SI,HI & AVH. Fruit cup given as patient requested for banana. Respirations are even and unlabored. Will continue to monitor for safety.

## 2023-02-15 NOTE — ED Notes (Signed)
Patient seen throwing up. Zofran given as prescribed. Made comfortable in bed. Will continue to monitor for safety.
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
## 2023-02-15 NOTE — ED Notes (Signed)
Went to draw patient alcohol level and patient started projectile vomiting. Yellow colored emesis about 50 ml to 100 ml. Emesis bag given.

## 2023-02-15 NOTE — Progress Notes (Signed)
   02/15/23 0831  [facility]UC Triage Screening (Walk-ins at [facility] only)  How Did You Hear About [place]? Family/Friend  What Is the Reason for Your Visit/Call Today? Pt arrived to [facility] accompanied by his wife [name]. Pt is seeking help with alcohol detox. Pt states he has been drinking for 30 years. 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 Pt. suffers from hypertension. Pt. had a shot of vodka this morning at 2am. Pt states he is drinking a half of fifth of vodka daily, but he has cut back to 6 or 8 oz for the past 3 weeks.  How Long Has This Been Causing You Problems? <Week  Have You Recently Had Any Thoughts About Hurting Yourself? No  Are You Planning to Commit Suicide/Harm Yourself At This time? No  Have you Recently Had Thoughts About Hurting Someone Karolee Ohs? No  Are You Planning To Harm Someone At This Time? No  Are you currently experiencing any auditory, visual or other hallucinations? No  Have You Used Any Alcohol or Drugs in the Past 24 Hours? Yes  How long ago did you use Drugs or Alcohol? at 2am this morning  What Did You Use and How Much? a shot of vodka  Do you have any current medical co-morbidities that require immediate attention? Yes  Please describe current medical co-morbidities that require immediate attention: Hypertension  Clinician description of patient physical appearance/behavior: casually dressed, calm, cooperative  If access to [facility] Urgent Care was not available, would you have sought care in the Emergency Department? Yes  Determination of Need Urgent (48 hours)  Options For Referral Inpatient Hospitalization;Facility-Based Crisis

## 2023-02-15 NOTE — ED Provider Notes (Signed)
Behavioral Health Urgent Care Medical Screening Exam  Patient Name: Austin Houston MRN: [id number] Date of Evaluation: 02/15/23 Chief Complaint:   Diagnosis:  Final diagnoses:  Alcohol use disorder, severe, in controlled environment, dependence (HCC)  Alcohol withdrawal syndrome without complication (HCC)  PTSD (post-traumatic stress disorder)    History of Present illness: Austin Houston is a 62 y.o. male patient presented to [facility] as a walk in accompanied by his wife Austin Houston with complaints of alcohol abuse and alcohol withdrawal.  Austin Houston, 62 y.o., male patient seen face to face by this provider, chart reviewed, and consulted with Dr. Nelly Rout on 02/15/23.  On evaluation Austin Houston reports he has been drinking alcohol for 30 yrs but feel he started having a problem about 15 to 20 yrs ago.  He states "I was drinking about a 1/2 fifth daily until I started on Naltrexone.  Now I'm drinking about 6 to 8 ounces a day."  He states his last drink was at 2:00 AM.  Patient denies prior psychiatric history (no prior diagnosis, psychiatric hospitalization or outpatient psychiatric services.  No prior suicide attempt or self-harming behaviors).  Patient states he has never experienced seizures or DT's with alcohol withdrawal and has never had detox.  Patient states he did have surgery for a aortic aneurism 202/26/2024.  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  He denies suicidal/self-harm/homicidal ideation, psychosis, and paranoia.    During evaluation Austin Houston is seated in exam room with no noted distress.  He is alert/oriented x 4, calm, cooperative, attentive, and responses were relevant and appropriate to assessment questions.  He spoke in a clear tone at moderate volume, and normal pace, with good eye contact.   He denies suicidal/self-harm/homicidal ideation, psychosis, and paranoia.  Objectively there is no evidence of  psychosis/mania or delusional thinking.  He conversed coherently, with goal directed thoughts, no distractibility, or pre-occupation.  Abnormal EKG which patient states is normal for him.  Spoke to Dr. Criss Alvine via telephone and reviewed EKG reporting he doesn't feel it is a stimi when compared to previous EKG's most likely related to alcohol withdrawal and tachycardia.  If patient starts to have symptoms or complaints of chest pain can send to ED. Patient recommended for admission to [facility] for alcohol detox.      Flowsheet Row ED from 02/15/2023 in [facility] ED from 11/05/2022 in [facility] at [facility]  Admission (Discharged) from 09/07/2022 in [facility] 4E CV SURGICAL PROGRESSIVE CARE  C-SSRS RISK CATEGORY No Risk No Risk No Risk       Psychiatric Specialty Exam  Presentation  General Appearance:Appropriate for Environment  Eye Contact:Good  Speech:Clear and Coherent; Normal Rate  Speech Volume:Normal  Handedness:Right   Mood and Affect  Mood: Anxious  Affect: Congruent; Appropriate   Thought Process  Thought Processes: Coherent; Goal Directed  Descriptions of Associations:Intact  Orientation:Full (Time, Place and Person)  Thought Content:Logical    Hallucinations:None  Ideas of Reference:None  Suicidal Thoughts:No  Homicidal Thoughts:No   Sensorium  Memory: Immediate Good; Recent Good; Remote Good  Judgment: Intact  Insight: Present   Executive Functions  Concentration: Good  Attention Span: Good  Recall: Good  Fund of Knowledge: Good  Language: Good   Psychomotor Activity  Psychomotor Activity: Tremor (Related to alcohol withdrawal)   Assets  Assets: Communication Skills; Desire for Improvement; Financial Resources/Insurance; Housing; Intimacy;  Physical Health; Resilience; Social Support; Transportation   Sleep  Sleep: Good  Number of hours: No data  recorded  Physical Exam: Physical Exam Vitals and nursing note reviewed. Chaperone present: Accompanied by wife.  Constitutional:      General: He is not in acute distress.    Appearance: Normal appearance. He is not ill-appearing.  HENT:     Head: Normocephalic.  Eyes:     Conjunctiva/sclera: Conjunctivae normal.  Cardiovascular:     Rate and Rhythm: Tachycardia present.     Comments: Abnormal EKG:  Spoke to Dr. Pricilla Loveless who reviewed EKG scan and reports that it looks weird but old EKG's have a lot of ST wave abnormality.  This one has more possible related to the alcohol withdrawal and fast heart rate.  But if there is no complaints, no chest pain start Ativan withdrawal protocol.  Don't think it is a stimi.    Patient stating that this is normal for him Pulmonary:     Effort: Pulmonary effort is normal. No respiratory distress.  Musculoskeletal:        General: Normal range of motion.     Cervical back: Normal range of motion.  Skin:    General: Skin is warm and dry.  Neurological:     General: No focal deficit present.     Mental Status: He is alert and oriented to person, place, and time.  Psychiatric:        Attention and Perception: Attention and perception normal. He does not perceive auditory or visual hallucinations.        Mood and Affect: Affect normal. Mood is anxious.        Speech: Speech normal.        Behavior: Behavior normal. Behavior is cooperative.        Thought Content: Thought content normal. Thought content is not paranoid or delusional. Thought content does not include homicidal or suicidal ideation.        Cognition and Memory: Cognition and memory normal.        Judgment: Judgment normal.    Review of Systems  Constitutional:        No other complaints voiced  Respiratory:  Negative for shortness of breath.   Cardiovascular:  Negative for chest pain.       History of HTN History of aortic aneurism 09/07/22 (surgery)  Gastrointestinal:   Negative for nausea and vomiting.  Neurological:  Positive for tremors (related to alcohol withdrawal). Negative for seizures.  Psychiatric/Behavioral:  Positive for substance abuse (Alcohol abuse). Depression: Stable. Hallucinations: Denies. Suicidal ideas: Denies.The patient does not have insomnia. Nervous/anxious: Stable.       Alcohol use for 30 yrs but for the last 15 to 20 yrs abuse.  Reports drinking 1/2 fifth Vodika daily until 3 weeks ago when started on Naltrexone now drinking about 6 to 8 oz daily.  Last drink was at 2:00 AM this morning  All other systems reviewed and are negative.  Blood pressure (!) 150/102, pulse (!) 105, temperature 97.9 F (36.6 C), temperature source Oral, resp. rate 20, SpO2 100%. There is no height or weight on file to calculate BMI.  Musculoskeletal: Strength & Muscle Tone: within normal limits Gait & Station: normal Patient leans: N/A   [facility] MSE Discharge Disposition for Follow up and Recommendations: Based on my evaluation the patient does not appear to have an emergency medical condition and can be discharged with resources and follow up care in outpatient services for Medication  Management and FBC admission  Lab Orders         CBC with Differential/Platelet         Comprehensive metabolic panel         Hemoglobin A1c         Magnesium         Ethanol         Lipid panel         TSH         Prolactin         Urinalysis, Routine w reflex microscopic -Urine, Clean Catch         POCT Urine Drug Screen - (I-Screen)      Meds ordered this encounter  Medications   acetaminophen (TYLENOL) tablet 650 mg   alum & mag hydroxide-simeth (MAALOX/MYLANTA) 200-200-20 MG/5ML suspension 30 mL   magnesium hydroxide (MILK OF MAGNESIA) suspension 30 mL   thiamine (VITAMIN B1) injection 100 mg   thiamine (VITAMIN B1) tablet 100 mg   multivitamin with minerals tablet 1 tablet   LORazepam (ATIVAN) tablet 1 mg   hydrOXYzine (ATARAX) tablet 25 mg   loperamide  (IMODIUM) capsule 2-4 mg   ondansetron (ZOFRAN-ODT) disintegrating tablet 4 mg   FOLLOWED BY Linked Order Group    LORazepam (ATIVAN) tablet 1 mg    LORazepam (ATIVAN) tablet 1 mg    LORazepam (ATIVAN) tablet 1 mg    LORazepam (ATIVAN) tablet 1 mg   aspirin EC tablet 81 mg   loratadine (CLARITIN) tablet 10 mg   Polyethyl Glycol-Propyl Glycol 0.4-0.3 % SOLN 2 drop   magnesium oxide (MAG-OX) tablet 400 mg   multivitamin with minerals tablet 1 tablet   albuterol (VENTOLIN HFA) 108 (90 Base) MCG/ACT inhaler 2 puff   mometasone-formoterol (DULERA) 200-5 MCG/ACT inhaler 2 puff   diclofenac Sodium (VOLTAREN) 1 % topical gel 2 g   fluticasone (FLONASE) 50 MCG/ACT nasal spray 1 spray   metoprolol tartrate (LOPRESSOR) tablet 25 mg   pantoprazole (PROTONIX) EC tablet 40 mg    Patient to be transferred to [facility]   , NP 02/15/2023, 9:09 AM

## 2023-02-15 NOTE — ED Notes (Signed)
Patient given saltine crackers and Gatorade. No vomiting noted.Respirations equal and unlabored and skin warm and dry. No acute distress noted . Encouragement and support prrovided. Safety maintain.

## 2023-02-15 NOTE — ED Notes (Signed)
Patient in the bedroom sleeping. Respirations are even and unlabored. Will continue to monitor for safety.

## 2023-02-15 NOTE — ED Notes (Signed)
Dash called for lab pick up.

## 2023-02-15 NOTE — ED Notes (Signed)
Shuvon Rankins, NP informed verbally of critical lab results alcohol level 350. No new orders received.

## 2023-02-16 DIAGNOSIS — F10930 Alcohol use, unspecified with withdrawal, uncomplicated: Secondary | ICD-10-CM | POA: Diagnosis not present

## 2023-02-16 DIAGNOSIS — F1020 Alcohol dependence, uncomplicated: Secondary | ICD-10-CM

## 2023-02-16 MED ORDER — ACETAMINOPHEN 325 MG PO TABS: 650.0000 mg | ORAL_TABLET | Freq: Four times a day (QID) | ORAL | Status: DC | PRN

## 2023-02-16 MED ORDER — ALBUTEROL SULFATE HFA 108 (90 BASE) MCG/ACT IN AERS: 2.0000 | INHALATION_SPRAY | Freq: Four times a day (QID) | RESPIRATORY_TRACT | Status: DC | PRN

## 2023-02-16 MED ORDER — ADULT MULTIVITAMIN W/MINERALS CH: 1.0000 | ORAL_TABLET | Freq: Every day | ORAL | Status: DC

## 2023-02-16 MED ORDER — MOMETASONE FURO-FORMOTEROL FUM 200-5 MCG/ACT IN AERO
2.0000 | INHALATION_SPRAY | Freq: Two times a day (BID) | RESPIRATORY_TRACT | Status: DC
  Administered 2023-02-16 – 2023-02-17 (×3): 2 via RESPIRATORY_TRACT

## 2023-02-16 MED ORDER — DIPHENHYDRAMINE HCL 50 MG/ML IJ SOLN: 25.0000 mg | Freq: Four times a day (QID) | INTRAMUSCULAR | Status: DC | PRN

## 2023-02-16 MED ORDER — HALOPERIDOL LACTATE 5 MG/ML IJ SOLN: 5.0000 mg | Freq: Four times a day (QID) | INTRAMUSCULAR | Status: DC | PRN

## 2023-02-16 MED ORDER — ALUM & MAG HYDROXIDE-SIMETH 200-200-20 MG/5ML PO SUSP: 30.0000 mL | ORAL | Status: DC | PRN

## 2023-02-16 MED ORDER — METOPROLOL TARTRATE 25 MG PO TABS
25.0000 mg | ORAL_TABLET | Freq: Two times a day (BID) | ORAL | Status: DC
  Administered 2023-02-16 – 2023-02-17 (×4): 25 mg via ORAL
  Filled 2023-02-16 (×4): qty 1

## 2023-02-16 MED ORDER — ASPIRIN 81 MG PO TBEC
81.0000 mg | DELAYED_RELEASE_TABLET | Freq: Every day | ORAL | Status: DC
  Administered 2023-02-16 – 2023-02-17 (×2): 81 mg via ORAL
  Filled 2023-02-16 (×2): qty 1

## 2023-02-16 MED ORDER — LORAZEPAM 1 MG PO TABS: 1.0000 mg | ORAL_TABLET | Freq: Every day | ORAL | Status: DC

## 2023-02-16 MED ORDER — LORAZEPAM 1 MG PO TABS: 1.0000 mg | ORAL_TABLET | Freq: Four times a day (QID) | ORAL | Status: DC

## 2023-02-16 MED ORDER — DIPHENHYDRAMINE HCL 25 MG PO CAPS
25.0000 mg | ORAL_CAPSULE | Freq: Four times a day (QID) | ORAL | Status: DC | PRN
  Administered 2023-02-17: 25 mg via ORAL
  Filled 2023-02-16: qty 1

## 2023-02-16 MED ORDER — LORAZEPAM 2 MG/ML IJ SOLN: 1.0000 mg | Freq: Four times a day (QID) | INTRAMUSCULAR | Status: DC | PRN

## 2023-02-16 MED ORDER — MAGNESIUM HYDROXIDE 400 MG/5ML PO SUSP: 30.0000 mL | Freq: Every day | ORAL | Status: DC | PRN

## 2023-02-16 MED ORDER — LOPERAMIDE HCL 2 MG PO CAPS: 2.0000 mg | ORAL_CAPSULE | ORAL | Status: DC | PRN

## 2023-02-16 MED ORDER — PANTOPRAZOLE SODIUM 40 MG PO TBEC
40.0000 mg | DELAYED_RELEASE_TABLET | Freq: Every day | ORAL | Status: DC
  Administered 2023-02-16 – 2023-02-17 (×2): 40 mg via ORAL
  Filled 2023-02-16 (×2): qty 1

## 2023-02-16 MED ORDER — LORAZEPAM 1 MG PO TABS: 1.0000 mg | ORAL_TABLET | Freq: Two times a day (BID) | ORAL | Status: DC

## 2023-02-16 MED ORDER — LORATADINE 10 MG PO TABS
10.0000 mg | ORAL_TABLET | Freq: Every day | ORAL | Status: DC
  Administered 2023-02-16 – 2023-02-17 (×2): 10 mg via ORAL
  Filled 2023-02-16 (×2): qty 1

## 2023-02-16 MED ORDER — MOMETASONE FURO-FORMOTEROL FUM 200-5 MCG/ACT IN AERO: 2.0000 | INHALATION_SPRAY | Freq: Two times a day (BID) | RESPIRATORY_TRACT | Status: DC

## 2023-02-16 MED ORDER — DICLOFENAC SODIUM 1 % EX GEL: 2.0000 g | Freq: Every day | CUTANEOUS | Status: DC | PRN

## 2023-02-16 MED ORDER — HYDROXYZINE HCL 25 MG PO TABS: 25.0000 mg | ORAL_TABLET | Freq: Four times a day (QID) | ORAL | Status: DC | PRN

## 2023-02-16 MED ORDER — LORAZEPAM 1 MG PO TABS: 1.0000 mg | ORAL_TABLET | Freq: Three times a day (TID) | ORAL | Status: DC

## 2023-02-16 MED ORDER — POLYETHYLENE GLYCOL 3350 17 G PO PACK: 17.0000 g | PACK | Freq: Every day | ORAL | Status: DC | PRN

## 2023-02-16 MED ORDER — LORAZEPAM 1 MG PO TABS: 1.0000 mg | ORAL_TABLET | Freq: Four times a day (QID) | ORAL | Status: DC | PRN

## 2023-02-16 MED ORDER — POLYVINYL ALCOHOL 1.4 % OP SOLN: 2.0000 [drp] | Freq: Every day | OPHTHALMIC | Status: DC | PRN

## 2023-02-16 MED ORDER — METOPROLOL TARTRATE 25 MG PO TABS: 25.0000 mg | ORAL_TABLET | Freq: Two times a day (BID) | ORAL | Status: DC

## 2023-02-16 MED ORDER — ONDANSETRON HCL 4 MG PO TABS: 8.0000 mg | ORAL_TABLET | Freq: Three times a day (TID) | ORAL | Status: DC | PRN

## 2023-02-16 MED ORDER — MAGNESIUM OXIDE -MG SUPPLEMENT 400 (240 MG) MG PO TABS
400.0000 mg | ORAL_TABLET | Freq: Every day | ORAL | Status: DC
  Administered 2023-02-16 – 2023-02-17 (×2): 400 mg via ORAL
  Filled 2023-02-16 (×2): qty 1

## 2023-02-16 MED ORDER — FLUTICASONE PROPIONATE 50 MCG/ACT NA SUSP: 2.0000 | Freq: Every day | NASAL | Status: DC

## 2023-02-16 MED ORDER — LORAZEPAM 1 MG PO TABS
1.0000 mg | ORAL_TABLET | ORAL | Status: DC | PRN
  Administered 2023-02-17: 2 mg via ORAL
  Filled 2023-02-16: qty 2

## 2023-02-16 MED ORDER — ASPIRIN 81 MG PO TBEC: 81.0000 mg | DELAYED_RELEASE_TABLET | Freq: Every day | ORAL | Status: DC

## 2023-02-16 MED ORDER — FLUTICASONE PROPIONATE 50 MCG/ACT NA SUSP
1.0000 | Freq: Every day | NASAL | Status: DC
  Administered 2023-02-16 – 2023-02-17 (×2): 1 via NASAL

## 2023-02-16 MED ORDER — HYDROXYZINE HCL 25 MG PO TABS: 25.0000 mg | ORAL_TABLET | Freq: Three times a day (TID) | ORAL | Status: DC | PRN

## 2023-02-16 MED ORDER — TRAZODONE HCL 50 MG PO TABS: 50.0000 mg | ORAL_TABLET | Freq: Every evening | ORAL | Status: DC | PRN

## 2023-02-16 MED ORDER — DIPHENHYDRAMINE HCL 25 MG PO CAPS: 25.0000 mg | ORAL_CAPSULE | Freq: Four times a day (QID) | ORAL | Status: DC | PRN

## 2023-02-16 MED ORDER — ONDANSETRON 4 MG PO TBDP: 4.0000 mg | ORAL_TABLET | Freq: Four times a day (QID) | ORAL | Status: DC | PRN

## 2023-02-16 MED ORDER — LORAZEPAM 1 MG PO TABS
1.0000 mg | ORAL_TABLET | Freq: Four times a day (QID) | ORAL | Status: DC | PRN
  Administered 2023-02-17: 1 mg via ORAL
  Filled 2023-02-16: qty 1

## 2023-02-16 MED ORDER — THIAMINE MONONITRATE 100 MG PO TABS
100.0000 mg | ORAL_TABLET | Freq: Every day | ORAL | Status: DC
  Administered 2023-02-16 – 2023-02-17 (×2): 100 mg via ORAL
  Filled 2023-02-16 (×2): qty 1

## 2023-02-16 MED ORDER — ADULT MULTIVITAMIN W/MINERALS CH
1.0000 | ORAL_TABLET | Freq: Every day | ORAL | Status: DC
  Administered 2023-02-16 – 2023-02-17 (×2): 1 via ORAL
  Filled 2023-02-16 (×2): qty 1

## 2023-02-16 MED ORDER — HALOPERIDOL 5 MG PO TABS
5.0000 mg | ORAL_TABLET | Freq: Four times a day (QID) | ORAL | Status: DC | PRN
  Administered 2023-02-17: 5 mg via ORAL
  Filled 2023-02-16: qty 1

## 2023-02-16 MED ORDER — MELATONIN 3 MG PO TABS: 3.0000 mg | ORAL_TABLET | Freq: Every evening | ORAL | Status: DC | PRN

## 2023-02-16 MED ORDER — HALOPERIDOL 5 MG PO TABS: 5.0000 mg | ORAL_TABLET | Freq: Four times a day (QID) | ORAL | Status: DC | PRN

## 2023-02-16 MED ORDER — SENNA 8.6 MG PO TABS: 1.0000 | ORAL_TABLET | Freq: Every evening | ORAL | Status: DC | PRN

## 2023-02-16 NOTE — Progress Notes (Signed)
Pt's CIWA was 5. °

## 2023-02-16 NOTE — ED Notes (Signed)
3x vomit. Will continue to monitor for safety.

## 2023-02-16 NOTE — Progress Notes (Signed)
Pt's was  HR 149. MD notified in person. Writer advised to administer scheduled Lorazepam and recheck in an hour. Med administered per order. Will continue to monitor.

## 2023-02-16 NOTE — Progress Notes (Signed)
Pt's CIWA was 4. 

## 2023-02-16 NOTE — Group Note (Unsigned)
Group Topic: Overcoming Obstacles  Group Date: 02/16/2023 Start Time: 2000 End Time: 2100 Facilitators: Maryln Manuel, LPN  Department: [facility]  Number of Participants: 2  Group Focus: coping skills Treatment Modality:  Individual Therapy Interventions utilized were orientation Purpose: enhance coping skills   Name: Austin Houston Date of Birth: [birth year]  MR: [id number]    Level of Participation: {THERAPIES; PSYCH GROUP PARTICIPATION WUJWJ:19147} Quality of Participation: {THERAPIES; PSYCH QUALITY OF PARTICIPATION:23992} Interactions with others: {THERAPIES; PSYCH INTERACTIONS:23993} Mood/Affect: {THERAPIES; PSYCH MOOD/AFFECT:23994} Triggers (if applicable): *** Cognition: {THERAPIES; PSYCH COGNITION:23995} Progress: {THERAPIES; PSYCH PROGRESS:23997} Response: *** Plan: {THERAPIES; PSYCH WGNF:62130}  Patients Problems:  Patient Active Problem List   Diagnosis Date Noted   Alcohol use disorder, severe 02/16/2023   Alcohol use disorder, severe, in controlled environment, dependence (HCC) 02/15/2023   Alcohol withdrawal (HCC) 02/15/2023   Alcohol abuse 02/15/2023   S/P ascending aortic aneurysm repair 09/07/2022   Aneurysm of ascending aorta without rupture (HCC) 07/20/2022   Mild persistent asthma without complication 01/24/2020   Prostate cancer (HCC) 01/08/2016   Family history of prostate cancer in father 06/19/2014   Varicose veins of lower extremities with other complications 03/01/2013   Former smokeless tobacco use 11/25/2012   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HCC)    GERD (gastroesophageal reflux disease)    Allergic rhinitis due to pollen    Hypertension    Hyperlipidemia    PTSD (post-traumatic stress disorder)

## 2023-02-16 NOTE — ED Notes (Signed)
Patient is sleeping. Respirations equal and unlabored, skin warm and dry. No change in assessment or acuity. Routine safety checks conducted according to facility protocol. Will continue to monitor for safety.   

## 2023-02-16 NOTE — Group Note (Signed)
Group Topic: Communication  Group Date: 02/16/2023 Start Time: 2000 End Time: 2100 Facilitators: Maryln Manuel, LPN  Department: [facility]  Number of Participants: 2  Group Focus: communication Treatment Modality:  Exposure Therapy Interventions utilized were support Purpose: express feelings  Name: Austin Houston Date of Birth: Aug 29, 1960  MR: [id number]    Level of Participation: did not attend Quality of Participation: did not attend Interactions with others: none Mood/Affect: did not attend  Triggers (if applicable): did not attend Cognition: did not attend  Progress: None Response: none Plan: patient will be encouraged to attend more meetings  Patients Problems:  Patient Active Problem List   Diagnosis Date Noted   Alcohol use disorder, severe 02/16/2023   Alcohol use disorder, severe, in controlled environment, dependence (HCC) 02/15/2023   Alcohol withdrawal (HCC) 02/15/2023   Alcohol abuse 02/15/2023   S/P ascending aortic aneurysm repair 09/07/2022   Aneurysm of ascending aorta without rupture (HCC) 07/20/2022   Mild persistent asthma without complication 01/24/2020   Prostate cancer (HCC) 01/08/2016   Family history of prostate cancer in father 06/19/2014   Varicose veins of lower extremities with other complications 03/01/2013   Former smokeless tobacco use 11/25/2012   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HCC)    GERD (gastroesophageal reflux disease)    Allergic rhinitis due to pollen    Hypertension    Hyperlipidemia    PTSD (post-traumatic stress disorder)

## 2023-02-16 NOTE — Progress Notes (Signed)
Pt is awake, alert and oriented X4. Pt did not voice any complaints of pain or discomfort. No signs of acute distress noted. Administered scheduled meds with no issue. Pt denies current SI/HI/AVH, plan or intent. Staff will monitor for pt's safety. 

## 2023-02-16 NOTE — ED Notes (Signed)
Patient informed RN about bumping his head while using the bathroom. RN assessed, scalp reddish but no open wound. Will keep monitoring.

## 2023-02-16 NOTE — Discharge Instructions (Signed)
Dear Austin Houston,  It was a pleasure to take care of you during your stay at [facility] where you were treated for your Alcohol abuse.  While you were here, you were:  observed and cared for by our nurses and nursing assistants  treated with medications by your psychiatrists  provided individual and group therapy by therapists  provided resources by our social workers and case managers  Please review the medication list provided to you at discharge and stop, start taking, or continue taking the medications listed there.  You should also follow-up with your primary care doctor, or start seeing one if you don't have one yet. If applicable, here are some scheduled follow-ups for you:    I recommend abstinence from alcohol, tobacco, and other illicit drug use.   If your psychiatric symptoms or suicidal thoughts recur, worsen, or if you have side effects to your psychiatric medications, call your outpatient psychiatric provider, 911, [phone] or go to the nearest emergency department.  Take care!  Signed: Augusto Gamble, MD 02/16/2023, 11:22 AM  For a list of more resources, see the following:  [facility] [address] [place], [place], [address] [phone] phone  New Patient Assessment/Therapy Walk-Ins:  Monday and Wednesday: 8 am until slots are full. Every 1st and 2nd Fridays of the month: 1 pm - 5 pm.  NO ASSESSMENT/THERAPY WALK-INS ON TUESDAYS OR THURSDAYS  New Patient Assessment/Medication Management Walk-Ins:  Monday - Friday:  8 am - 11 am.  For all walk-ins, we ask that you arrive by 7:30 am because patients will be seen in the order of arrival.  Availability is limited; therefore, you may not be seen on the same day that you walk-in.  Our goal is to serve and meet the needs of our community to the best of our ability.  12 STEP PROGRAMS:  Alcoholics Anonymous of [place] SoftwareChalet.be  Narcotics  Anonymous of [place] HitProtect.dk  Al-Anon of [place], [place] www.[place]alanon.org/find-meetings.html  Nar-Anon https://nar-anon.org/find-a-meeting  Naloxone (Narcan) can help reverse an overdose when given to the victim quickly.  [place] offers free naloxone kits and instructions/training on its use.  Add naloxone to your first aid kit and you can help save a life. A prescription can be filled at your local pharmacy or free kits are provided by the county.  Pick up your free kit at the following locations:   [place]:  [facility], [address] [place] [address] [phone]) [facility] [address] [place] [address] [phone]) [facility] [address] [place] [address]  [place]: [facility] of [facility] [address] [place] [address] ([phone]) [facility] [address] [place] [address] [phone])

## 2023-02-16 NOTE — Group Note (Signed)
Group Topic: Decisional Balance/Substance Abuse  Group Date: 02/16/2023 Start Time: 1000 End Time: 1040 Facilitators: Priscille Kluver, NT  Department: [facility]  Number of Participants: 2  Group Focus: acceptance Treatment Modality:  Solution-Focused Therapy Interventions utilized were story telling Purpose: enhance coping skills  Name: Austin Houston Date of Birth: 08/24/1960  MR: [id number]    Level of Participation: active Quality of Participation: attentive Interactions with others: gave feedback Mood/Affect: appropriate  Cognition: insightful Progress: Gaining insight  Plan: wanting to change for himself and family.   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

## 2023-02-16 NOTE — ED Notes (Signed)
Pt calm and cooperative no pain or distress noted alert and orient x 4. Will continue to monitor for safety

## 2023-02-16 NOTE — Progress Notes (Addendum)
Pt's HR 152 continues to be elevated. Denies any chest pain or tingling. MD notified in person. No new orders.

## 2023-02-16 NOTE — ED Notes (Signed)
Pt sleeping@this time. Breathing even and unlabored. Will continue to monitor for safety 

## 2023-02-16 NOTE — Group Note (Signed)
Group Topic: Wellness  Group Date: 02/16/2023 Start Time: 1600 End Time: 1700 Facilitators: Dickie La, RN  Department: [facility]  Number of Participants: 4  Group Focus: problem solving, self-awareness, and self-esteem Treatment Modality:  Psychoeducation Interventions utilized were mental fitness, problem solving, reminiscence, story telling, and support Purpose: enhance coping skills, express feelings, express irrational fears, increase insight, regain self-worth, reinforce self-care, and relapse prevention strategies  Name: Austin Houston Date of Birth: 1960-12-19  MR: [id number]    Level of Participation: active Quality of Participation: attentive, cooperative, initiates communication, offered feedback, and supportive Interactions with others: gave feedback Mood/Affect: appropriate and positive Triggers (if applicable): none Cognition: coherent/clear, insightful, and logical Progress: Gaining insight Response: Pt actively participated and gave positive feedback. 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

## 2023-02-16 NOTE — ED Provider Notes (Cosign Needed Addendum)
Facility Based Crisis Admission H&P  Date: 02/16/23 Patient Name: Austin Houston MRN: [id number] Chief Complaint: "alcohol withdrawal"  Diagnoses:  Final diagnoses:  Alcohol withdrawal syndrome without complication (HCC)  Alcohol use disorder, severe   HPI:  Austin Houston is a 62 y.o., male with no past psychiatric history and substance use history of alcohol use disorder  who presents to the Facility Based Crisis center from behavioral health urgent care [facility]) for evaluation and management of alcohol withdrawal and detox.  Patient says he has been using alcohol regularly for the past 30 years after a messy divorce with his first wife. He says it was not until 20 years ago that he identified his alcohol use as problematic, and he has successfully achieved sobriety multiple times on his own since then and most recently was able to cut back on alcohol consumption with the help of naltrexone. About 8 - 9 months ago his grandson was born and developed congenital heart malformations which caused him significant stress. Around this time, he also began having heart and shoulder issues. These two life stressors caused him to turn to alcohol to relieve stress and has since been drinking 6 - 8 ounces of liquor a day. He notes his last drink was 2 am on Sunday (8/4). He denies having experienced seizures, hallucinations, or severe alcohol withdrawal symptoms leading to hospitalization in the past.  Patient denies prior psychiatric diagnoses. He endorses a prior smoking history and quit altogether 5 years ago. He denies any other illicit substance use.  He is amenable to restarting naltrexone to address cravings. He is seeking IOP resources - not open to doing residential rehab at this time.  Patient confirmed his cardiac medication regimen (metoprolol, aspirin). He also takes some allergy medications. At the time of interview he denies any chest pain or palpitations.  PHQ 2-9:  Flowsheet Row ED  from 02/15/2023 in [facility] Office Visit from 12/12/2020 in [facility] [place] Office Visit from 12/16/2017 in [facility]  Thoughts that you would be better off dead, or of hurting yourself in some way Not at all Not at all Not at all  PHQ-9 Total Score 8 0 7       Flowsheet Row ED from 02/15/2023 in [facility] Most recent reading at 02/15/2023  8:55 PM ED from 02/15/2023 in [facility] Most recent reading at 02/15/2023  9:50 AM ED from 11/05/2022 in [facility] [place]  Most recent reading at 11/05/2022  2:59 PM  C-SSRS RISK CATEGORY No Risk No Risk No Risk        Total Time spent with patient: 1 hour  Musculoskeletal  Strength & Muscle Tone: within normal limits Gait & Station: normal Patient leans: N/A  Psychiatric Specialty Exam  Presentation General Appearance: Appropriate for Environment; Well Groomed   Eye Contact:Good   Speech:Clear and Coherent; Normal Rate   Speech Volume:Normal   Handedness:Right   Mood and Affect  Mood:-- ("I feel alright")   Affect:Appropriate; Congruent; Full Range   Thought Process  Thought Processes:Coherent; Linear; Goal Directed   Descriptions of Associations:Intact   Orientation:Full (Time, Place and Person)   Thought Content:Logical; WDL  Diagnosis of Schizophrenia or Schizoaffective disorder in past: No    Hallucinations:Hallucinations: None   Ideas of Reference:None   Suicidal Thoughts:Suicidal Thoughts: No   Homicidal Thoughts:Homicidal Thoughts: No   Sensorium  Memory:Immediate  Good; Recent Good; Remote Good   Judgment:Good   Insight:Good   Executive Functions  Concentration:Good   Attention Span:Good   Recall:Good   Fund of Knowledge:Good   Language:Good   Psychomotor Activity  Psychomotor Activity:Psychomotor Activity:  Normal   Assets  Assets:Communication Skills; Desire for Improvement; Resilience   Sleep  Sleep:Sleep: Good   Nutritional Assessment (For OBS and FBC admissions only) Has the patient had a weight loss or gain of 10 pounds or more in the last 3 months?: No Has the patient had a decrease in food intake/or appetite?: No Does the patient have dental problems?: No Does the patient have eating habits or behaviors that may be indicators of an eating disorder including binging or inducing vomiting?: No Has the patient recently lost weight without trying?: 0 Has the patient been eating poorly because of a decreased appetite?: 0 Malnutrition Screening Tool Score: 0   Physical Exam Vitals and nursing note reviewed.  HENT:     Head: Normocephalic and atraumatic.  Pulmonary:     Effort: Pulmonary effort is normal.  Musculoskeletal:     Cervical back: Normal range of motion.  Neurological:     General: No focal deficit present.     Mental Status: He is alert.     Comments: tremors    Review of Systems  Constitutional: Negative.   Respiratory: Negative.    Cardiovascular: Negative.   Gastrointestinal: Negative.   Genitourinary: Negative.    Blood pressure (!) 125/93, pulse (!) 152, temperature 98.8 F (37.1 C), resp. rate 20, SpO2 96%. There is no height or weight on file to calculate BMI.  Past Psychiatric History: denies   Is the patient at risk to self? No Has the patient been a risk to self in the past 6 months? No Has the patient been a risk to self within the distant past? No Is the patient a risk to others? No Has the patient been a risk to others in the past 6 months? No Has the patient been a risk to others within the distant past? No  Past Medical History: Ascending aortic aneurysm s/p repair Family History: "everyone in my family drinks" Social History: drinks 6-8 alcoholic drinks daily. Married and lives with wife. 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  Last  Labs:     Latest Ref Rng & Units 02/15/2023    9:50 AM 09/09/2022    5:58 AM 09/08/2022   11:36 PM  CMP  Glucose 70 - 99 mg/dL 161  096  045   BUN 6 - 20 mg/dL 9  15  18    Creatinine 0.61 - 1.24 mg/dL 4.09  8.11  9.14   Sodium 135 - 145 mmol/L 137  135  133   Potassium 3.5 - 5.1 mmol/L 4.1  3.9  4.1   Chloride 98 - 111 mmol/L 93  100  99   CO2 22 - 32 mmol/L 22  26  26    Calcium 8.9 - 10.3 mg/dL 9.8  8.3  8.0   Total Protein 6.5 - 8.1 g/dL 7.6     Total Bilirubin 0.3 - 1.2 mg/dL 1.0     Alkaline Phos 38 - 126 U/L 69     AST 15 - 41 U/L 237     ALT 0 - 44 U/L 132     CBC    Component Value Date/Time   WBC 3.4 (L) 02/15/2023 0950   RBC 5.23 02/15/2023 0950   HGB 15.9 02/15/2023 0950  HCT 46.9 02/15/2023 0950   PLT 173 02/15/2023 0950   MCV 89.7 02/15/2023 0950   MCH 30.4 02/15/2023 0950   MCHC 33.9 02/15/2023 0950   RDW 14.9 02/15/2023 0950   LYMPHSABS 0.5 (L) 02/15/2023 0950   MONOABS 0.4 02/15/2023 0950   EOSABS 0.0 02/15/2023 0950   BASOSABS 0.0 02/15/2023 0950    Allergies: Vioxx [rofecoxib], Celebrex [celecoxib], Codeine, and Doxycycline  PTA Medications: (Not in a hospital admission)   Long Term Goals: Improvement in symptoms so as ready for discharge  Short Term Goals: Patient will verbalize feelings in meetings with treatment team members. and Patient will take medications as prescribed daily.  Medical Decision Making  Scheduled -- CIWA with scheduled and PRN lorazepam protocol -- can consider starting naltrexone for alcohol cravings after CMP recheck -- Patient does not need nicotine replacement  PRNs              -- start acetaminophen 650 mg every 6 hours as needed for mild to moderate pain, fever, and headaches              -- start hydroxyzine 25 mg three times a day as needed for anxiety              -- start senna 8.6 mg oral at bedtime and polyethylene glycol 17 g oral daily as needed for mild to moderate constipation              -- start  ondansetron 8 mg every 8 hours as needed for nausea or vomiting              -- start aluminum-magnesium hydroxide + simethicone 30 mL every 4 hours as needed for heartburn or indigestion              -- start melatonin 3 mg bedtime as needed for insomnia  -- As needed agitation protocol in-place  Recommendations  Based on my evaluation the patient does not appear to have an emergency medical condition.  Augusto Gamble, MD 02/16/23  10:40 AM

## 2023-02-16 NOTE — ED Notes (Addendum)
Patient continues to have withdrawal symptoms such as severe tremors, sweats, nausea anxiety. CIWA 16 at this time. Medicated as prescribed and pt reassured.  Will continue to monitor for safety.

## 2023-02-17 ENCOUNTER — Encounter (HOSPITAL_COMMUNITY): Payer: Self-pay | Admitting: Internal Medicine

## 2023-02-17 ENCOUNTER — Inpatient Hospital Stay (HOSPITAL_COMMUNITY)
Admission: EM | Admit: 2023-02-17 | Discharge: 2023-02-23 | DRG: 897 | Disposition: A | Discharge status: Rehabilitation Facility | Payer: 59 | Attending: Student | Admitting: Student

## 2023-02-17 ENCOUNTER — Other Ambulatory Visit: Payer: Self-pay

## 2023-02-17 DIAGNOSIS — F10930 Alcohol use, unspecified with withdrawal, uncomplicated: Secondary | ICD-10-CM

## 2023-02-17 DIAGNOSIS — D61818 Other pancytopenia: Secondary | ICD-10-CM | POA: Diagnosis not present

## 2023-02-17 DIAGNOSIS — F10939 Alcohol use, unspecified with withdrawal, unspecified: Secondary | ICD-10-CM | POA: Diagnosis present

## 2023-02-17 DIAGNOSIS — J45909 Unspecified asthma, uncomplicated: Secondary | ICD-10-CM | POA: Diagnosis present

## 2023-02-17 DIAGNOSIS — F1090 Alcohol use, unspecified, uncomplicated: Principal | ICD-10-CM

## 2023-02-17 DIAGNOSIS — E785 Hyperlipidemia, unspecified: Secondary | ICD-10-CM | POA: Diagnosis not present

## 2023-02-17 DIAGNOSIS — R7401 Elevation of levels of liver transaminase levels: Secondary | ICD-10-CM | POA: Diagnosis not present

## 2023-02-17 DIAGNOSIS — Z7982 Long term (current) use of aspirin: Secondary | ICD-10-CM | POA: Diagnosis not present

## 2023-02-17 DIAGNOSIS — I480 Paroxysmal atrial fibrillation: Secondary | ICD-10-CM | POA: Diagnosis present

## 2023-02-17 DIAGNOSIS — F4310 Post-traumatic stress disorder, unspecified: Secondary | ICD-10-CM | POA: Diagnosis present

## 2023-02-17 DIAGNOSIS — Z85850 Personal history of malignant neoplasm of thyroid: Secondary | ICD-10-CM | POA: Diagnosis not present

## 2023-02-17 DIAGNOSIS — F1020 Alcohol dependence, uncomplicated: Secondary | ICD-10-CM | POA: Diagnosis not present

## 2023-02-17 DIAGNOSIS — R000 Tachycardia, unspecified: Secondary | ICD-10-CM | POA: Diagnosis present

## 2023-02-17 DIAGNOSIS — R45851 Suicidal ideations: Secondary | ICD-10-CM | POA: Diagnosis present

## 2023-02-17 DIAGNOSIS — Z8042 Family history of malignant neoplasm of prostate: Secondary | ICD-10-CM

## 2023-02-17 DIAGNOSIS — I10 Essential (primary) hypertension: Secondary | ICD-10-CM | POA: Diagnosis not present

## 2023-02-17 DIAGNOSIS — F19939 Other psychoactive substance use, unspecified with withdrawal, unspecified: Secondary | ICD-10-CM | POA: Diagnosis not present

## 2023-02-17 DIAGNOSIS — R442 Other hallucinations: Secondary | ICD-10-CM | POA: Diagnosis not present

## 2023-02-17 DIAGNOSIS — Z1152 Encounter for screening for COVID-19: Secondary | ICD-10-CM | POA: Diagnosis not present

## 2023-02-17 DIAGNOSIS — Z8249 Family history of ischemic heart disease and other diseases of the circulatory system: Secondary | ICD-10-CM | POA: Diagnosis not present

## 2023-02-17 DIAGNOSIS — F10259 Alcohol dependence with alcohol-induced psychotic disorder, unspecified: Secondary | ICD-10-CM | POA: Diagnosis present

## 2023-02-17 DIAGNOSIS — Z79899 Other long term (current) drug therapy: Secondary | ICD-10-CM

## 2023-02-17 DIAGNOSIS — Z8546 Personal history of malignant neoplasm of prostate: Secondary | ICD-10-CM | POA: Diagnosis not present

## 2023-02-17 DIAGNOSIS — F10231 Alcohol dependence with withdrawal delirium: Secondary | ICD-10-CM | POA: Diagnosis not present

## 2023-02-17 DIAGNOSIS — E876 Hypokalemia: Secondary | ICD-10-CM | POA: Diagnosis present

## 2023-02-17 DIAGNOSIS — R748 Abnormal levels of other serum enzymes: Secondary | ICD-10-CM | POA: Diagnosis not present

## 2023-02-17 DIAGNOSIS — F329 Major depressive disorder, single episode, unspecified: Secondary | ICD-10-CM | POA: Diagnosis not present

## 2023-02-17 DIAGNOSIS — H9193 Unspecified hearing loss, bilateral: Secondary | ICD-10-CM | POA: Diagnosis present

## 2023-02-17 DIAGNOSIS — K219 Gastro-esophageal reflux disease without esophagitis: Secondary | ICD-10-CM | POA: Diagnosis not present

## 2023-02-17 DIAGNOSIS — K760 Fatty (change of) liver, not elsewhere classified: Secondary | ICD-10-CM | POA: Diagnosis not present

## 2023-02-17 DIAGNOSIS — Y906 Blood alcohol level of 120-199 mg/100 ml: Secondary | ICD-10-CM | POA: Diagnosis present

## 2023-02-17 DIAGNOSIS — F419 Anxiety disorder, unspecified: Secondary | ICD-10-CM | POA: Diagnosis not present

## 2023-02-17 DIAGNOSIS — F32A Depression, unspecified: Secondary | ICD-10-CM | POA: Diagnosis not present

## 2023-02-17 DIAGNOSIS — Z7951 Long term (current) use of inhaled steroids: Secondary | ICD-10-CM

## 2023-02-17 DIAGNOSIS — F10251 Alcohol dependence with alcohol-induced psychotic disorder with hallucinations: Secondary | ICD-10-CM

## 2023-02-17 DIAGNOSIS — R410 Disorientation, unspecified: Secondary | ICD-10-CM | POA: Diagnosis not present

## 2023-02-17 DIAGNOSIS — Z85828 Personal history of other malignant neoplasm of skin: Secondary | ICD-10-CM

## 2023-02-17 LAB — CBC
HCT: 37.8 % — ABNORMAL LOW (ref 39.0–52.0)
Hemoglobin: 12.7 g/dL — ABNORMAL LOW (ref 13.0–17.0)
MCH: 31.7 pg (ref 26.0–34.0)
MCHC: 33.6 g/dL (ref 30.0–36.0)
MCV: 94.3 fL (ref 80.0–100.0)
Platelets: 125 10*3/uL — ABNORMAL LOW (ref 150–400)
RBC: 4.01 MIL/uL — ABNORMAL LOW (ref 4.22–5.81)
RDW: 14.3 % (ref 11.5–15.5)
WBC: 3.7 10*3/uL — ABNORMAL LOW (ref 4.0–10.5)
nRBC: 0 % (ref 0.0–0.2)

## 2023-02-17 LAB — TSH: TSH: 6.941 u[IU]/mL — ABNORMAL HIGH (ref 0.350–4.500)

## 2023-02-17 LAB — CREATININE, SERUM
Creatinine, Ser: 1.14 mg/dL (ref 0.61–1.24)
GFR, Estimated: >60 mL/min (ref >60)

## 2023-02-17 LAB — VITAMIN B12: Vitamin B-12: 652 pg/mL (ref 180–914)

## 2023-02-17 LAB — HIV ANTIBODY (ROUTINE TESTING W REFLEX): HIV Screen 4th Generation wRfx: NONREACTIVE

## 2023-02-17 MED ORDER — LORAZEPAM 1 MG PO TABS: 2.0000 mg | ORAL_TABLET | Freq: Two times a day (BID) | ORAL | Status: DC

## 2023-02-17 MED ORDER — CHLORDIAZEPOXIDE HCL 25 MG PO CAPS
25.0000 mg | ORAL_CAPSULE | Freq: Two times a day (BID) | ORAL | Status: AC
  Administered 2023-02-18 – 2023-02-19 (×2): 25 mg via ORAL
  Filled 2023-02-17 (×2): qty 1

## 2023-02-17 MED ORDER — GUAIFENESIN 100 MG/5ML PO LIQD: 5.0000 mL | ORAL | Status: DC | PRN

## 2023-02-17 MED ORDER — LORAZEPAM 1 MG PO TABS
2.0000 mg | ORAL_TABLET | Freq: Once | ORAL | Status: AC
  Administered 2023-02-17: 2 mg via ORAL
  Filled 2023-02-17: qty 2

## 2023-02-17 MED ORDER — SENNOSIDES-DOCUSATE SODIUM 8.6-50 MG PO TABS: 1.0000 | ORAL_TABLET | Freq: Every evening | ORAL | Status: DC | PRN

## 2023-02-17 MED ORDER — ONDANSETRON HCL 4 MG PO TABS: 4.0000 mg | ORAL_TABLET | Freq: Four times a day (QID) | ORAL | Status: DC | PRN

## 2023-02-17 MED ORDER — FOLIC ACID 1 MG PO TABS
1.0000 mg | ORAL_TABLET | Freq: Every day | ORAL | Status: DC
  Administered 2023-02-17 – 2023-02-22 (×6): 1 mg via ORAL
  Filled 2023-02-17 (×6): qty 1

## 2023-02-17 MED ORDER — LORAZEPAM 1 MG PO TABS: 2.0000 mg | ORAL_TABLET | Freq: Three times a day (TID) | ORAL | Status: DC

## 2023-02-17 MED ORDER — HYDRALAZINE HCL 20 MG/ML IJ SOLN
10.0000 mg | INTRAMUSCULAR | Status: DC | PRN
  Administered 2023-02-19: 10 mg via INTRAVENOUS
  Filled 2023-02-17: qty 1

## 2023-02-17 MED ORDER — ACETAMINOPHEN 650 MG RE SUPP: 650.0000 mg | Freq: Four times a day (QID) | RECTAL | Status: DC | PRN

## 2023-02-17 MED ORDER — ONDANSETRON HCL 4 MG/2ML IJ SOLN: 4.0000 mg | Freq: Four times a day (QID) | INTRAMUSCULAR | Status: DC | PRN

## 2023-02-17 MED ORDER — ACETAMINOPHEN 325 MG PO TABS: 650.0000 mg | ORAL_TABLET | Freq: Four times a day (QID) | ORAL | Status: DC | PRN

## 2023-02-17 MED ORDER — ADULT MULTIVITAMIN W/MINERALS CH
1.0000 | ORAL_TABLET | Freq: Every day | ORAL | Status: DC
  Administered 2023-02-17 – 2023-02-22 (×6): 1 via ORAL
  Filled 2023-02-17 (×6): qty 1

## 2023-02-17 MED ORDER — POLYVINYL ALCOHOL 1.4 % OP SOLN
2.0000 [drp] | Freq: Two times a day (BID) | OPHTHALMIC | Status: DC
  Administered 2023-02-17: 2 [drp] via OPHTHALMIC
  Filled 2023-02-17: qty 15

## 2023-02-17 MED ORDER — ENOXAPARIN SODIUM 40 MG/0.4ML IJ SOSY
40.0000 mg | PREFILLED_SYRINGE | INTRAMUSCULAR | Status: DC
  Administered 2023-02-17 – 2023-02-19 (×3): 40 mg via SUBCUTANEOUS
  Filled 2023-02-17 (×5): qty 0.4

## 2023-02-17 MED ORDER — LORAZEPAM 1 MG PO TABS: 2.0000 mg | ORAL_TABLET | Freq: Every day | ORAL | Status: DC

## 2023-02-17 MED ORDER — METOPROLOL TARTRATE 25 MG PO TABS
25.0000 mg | ORAL_TABLET | Freq: Two times a day (BID) | ORAL | Status: DC
  Administered 2023-02-17: 25 mg via ORAL
  Filled 2023-02-17 (×2): qty 1

## 2023-02-17 MED ORDER — METOPROLOL TARTRATE 5 MG/5ML IV SOLN: 5.0000 mg | INTRAVENOUS | Status: DC | PRN

## 2023-02-17 MED ORDER — PANTOPRAZOLE SODIUM 40 MG PO TBEC
40.0000 mg | DELAYED_RELEASE_TABLET | Freq: Every day | ORAL | Status: DC
  Administered 2023-02-18 – 2023-02-22 (×5): 40 mg via ORAL
  Filled 2023-02-17 (×5): qty 1

## 2023-02-17 MED ORDER — SODIUM CHLORIDE 0.9 % IV SOLN: INTRAVENOUS | Status: DC

## 2023-02-17 MED ORDER — LACTATED RINGERS IV BOLUS
1000.0000 mL | Freq: Once | INTRAVENOUS | Status: AC
  Administered 2023-02-17: 1000 mL via INTRAVENOUS

## 2023-02-17 MED ORDER — LORAZEPAM 1 MG PO TABS
1.0000 mg | ORAL_TABLET | ORAL | Status: AC | PRN
  Administered 2023-02-17: 1 mg via ORAL
  Administered 2023-02-17 – 2023-02-18 (×2): 3 mg via ORAL
  Filled 2023-02-17: qty 3
  Filled 2023-02-17 (×2): qty 1
  Filled 2023-02-17: qty 3

## 2023-02-17 MED ORDER — LORAZEPAM 1 MG PO TABS
1.0000 mg | ORAL_TABLET | ORAL | Status: AC | PRN
  Administered 2023-02-17: 1 mg via ORAL

## 2023-02-17 MED ORDER — LORAZEPAM 1 MG PO TABS: 1.0000 mg | ORAL_TABLET | Freq: Three times a day (TID) | ORAL | Status: DC

## 2023-02-17 MED ORDER — ASPIRIN 81 MG PO TBEC
81.0000 mg | DELAYED_RELEASE_TABLET | Freq: Every day | ORAL | Status: DC
  Administered 2023-02-18 – 2023-02-22 (×5): 81 mg via ORAL
  Filled 2023-02-17 (×5): qty 1

## 2023-02-17 MED ORDER — IPRATROPIUM-ALBUTEROL 0.5-2.5 (3) MG/3ML IN SOLN: 3.0000 mL | RESPIRATORY_TRACT | Status: DC | PRN

## 2023-02-17 MED ORDER — LORAZEPAM 1 MG PO TABS: 1.0000 mg | ORAL_TABLET | Freq: Two times a day (BID) | ORAL | Status: DC

## 2023-02-17 MED ORDER — CHLORDIAZEPOXIDE HCL 25 MG PO CAPS
25.0000 mg | ORAL_CAPSULE | Freq: Every day | ORAL | Status: AC
  Administered 2023-02-19: 25 mg via ORAL
  Filled 2023-02-17: qty 1

## 2023-02-17 MED ORDER — THIAMINE MONONITRATE 100 MG PO TABS
100.0000 mg | ORAL_TABLET | Freq: Every day | ORAL | Status: DC
  Administered 2023-02-17 – 2023-02-22 (×6): 100 mg via ORAL
  Filled 2023-02-17 (×6): qty 1

## 2023-02-17 MED ORDER — THIAMINE HCL 100 MG/ML IJ SOLN
100.0000 mg | Freq: Every day | INTRAMUSCULAR | Status: DC
  Filled 2023-02-17: qty 2

## 2023-02-17 MED ORDER — LORAZEPAM 1 MG PO TABS: 1.0000 mg | ORAL_TABLET | Freq: Every day | ORAL | Status: DC

## 2023-02-17 MED ORDER — CHLORDIAZEPOXIDE HCL 25 MG PO CAPS
25.0000 mg | ORAL_CAPSULE | Freq: Three times a day (TID) | ORAL | Status: AC
  Administered 2023-02-17 – 2023-02-18 (×3): 25 mg via ORAL
  Filled 2023-02-17 (×3): qty 1

## 2023-02-17 NOTE — ED Notes (Signed)
Pt wife called to inform writer that the husband called her twice this morning and does not sound right. He, the pt, is very confused and has never seen him like that. He should not be discharged, if he has to be discharged, the provider should communicate with her first. Wife's phone number is [phone].

## 2023-02-17 NOTE — Progress Notes (Signed)
Pt is confused and disoriented to time, place, day and date. Per report pt paced all night with little sleep. Writer observed pt pacing at the beginning of shift. Pt had to be redirected multiple times. Pt's CIWA was 16. MD notified in person. No new orders. Will continue to monitor.

## 2023-02-17 NOTE — ED Notes (Signed)
ED TO INPATIENT HANDOFF REPORT  ED Nurse Name and Phone #: Beatris Ship RN [phone]  S Name/Age/Gender Austin Houston 62 y.o. male Room/Bed: 044C/044C  Code Status   Code Status: Full Code  Home/SNF/Other Home Patient oriented to: self, place, time, and situation Is this baseline? Yes   Triage Complete: Triage complete  Chief Complaint Alcoholism with psychosis [facility]) [F10.259]  Triage Note Pt BIB [facility] from [facility] withdrawal. He has been there since Sunday and last drink was on Sunday.Staff noted change in his mental status. He was having vision hallucination. He will answer question then stare off into space. EMS reported he will say something random. Saff also notice he has tremors in his hands, which they noted this was new. No medication was given by EMS. No N/V, C/P or shortness of breath. Vital signs: BP 132/74,  RR 20, HR 86, saturation 97% RA and CBG 116. EMS reported he is A & O x3 , but time unaware.   Allergies Allergies  Allergen Reactions   Vioxx [Rofecoxib] Anaphylaxis and Other (See Comments)    Tolerates aspirin and naproxen.   Celebrex [Celecoxib] Rash and Other (See Comments)    Tolerates aspirin and naproxen.   Codeine Rash and Other (See Comments)    Patient reports this happened as a child. He has tolerated Codeine since that time   Doxycycline Rash    Level of Care/Admitting Diagnosis ED Disposition     ED Disposition  Admit   Condition  --   Comment  Hospital Area: [facility] [[id number]]  Level of Care: Telemetry Medical [104]  May admit patient to [facility] or [facility] if equivalent level of care is available:: Yes  Covid Evaluation: Confirmed COVID Negative  Diagnosis: Alcoholism with psychosis [facility]) [[id number]]  Admitting Physician: Stephania Fragmin [facility] [[id number]]  Attending Physician: Stephania Fragmin [facility] [[id number]]  Certification:: I certify this patient will need inpatient services for at least 2  midnights  Estimated Length of Stay: 2          B Medical/Surgery History Past Medical History:  Diagnosis Date   Actinic keratosis    Allergic rhinitis due to pollen    Asthma    vs COPD- taking inhalers- as related to allergies only.   Basal cell carcinoma 03/26/2020   R lat deltoid - ED&C    Dysplastic nevus 03/07/2020   L costal infrapectoral - moderate   Dysplastic nevus 03/07/2020   R post flank above waistline - moderate   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HCC)    s/p partial thyroidectomy (follicular adenoma)-surgery only   GERD (gastroesophageal reflux disease)    Hearing impaired person, bilateral    hearing aida bilateral"high frequency loss   Hyperlipidemia    Hypertension    Prostate cancer (HCC) 01/08/2016   PTSD (post-traumatic stress disorder)    s/p multiple active deployments for [employer], no medications taken currently   Past Surgical History:  Procedure Laterality Date   COLONOSCOPY W/ POLYPECTOMY     age 59 "adenoma"   INGUINAL HERNIA REPAIR     right   LEFT HEART CATH AND CORONARY ANGIOGRAPHY N/A 07/20/2022   Procedure: LEFT HEART CATH AND CORONARY ANGIOGRAPHY;  Surgeon: Tonny Bollman, MD;  Location: [facility] INVASIVE CV LAB;  Service: Cardiovascular;  Laterality: N/A;   REPLACEMENT ASCENDING AORTA N/A 09/07/2022   Procedure: REPLACEMENT ASCENDING AORTA;  Surgeon: Alleen Borne, MD;  Location: [facility] OR;  Service: Open Heart Surgery;  Laterality:  N/A;  WITH CIRC ARREST   ROBOT ASSISTED LAPAROSCOPIC RADICAL PROSTATECTOMY N/A 07/23/2016   Procedure: XI ROBOTIC ASSISTED LAPAROSCOPIC RADICAL PROSTATECTOMY LEVEL 1;  Surgeon: Heloise Purpura, MD;  Location: [facility] ORS;  Service: Urology;  Laterality: N/A;   TEE WITHOUT CARDIOVERSION N/A 09/07/2022   Procedure: TRANSESOPHAGEAL ECHOCARDIOGRAM (TEE);  Surgeon: Alleen Borne, MD;  Location: [facility] OR;  Service: Open Heart Surgery;  Laterality: N/A;   THYROIDECTOMY, PARTIAL     [name] ([facility])     A IV  Location/Drains/Wounds Patient Lines/Drains/Airways Status     Active Line/Drains/Airways     Name Placement date Placement time Site Days   Peripheral IV 02/17/23 18 G Left Antecubital 02/17/23  1242  Antecubital  less than 1   Incision - 6 Ports Abdomen 1: Left;Lateral;Lower 2: Left;Lateral;Upper 3: Umbilicus;Superior 4: Right;Lateral;Lower 5: Right;Medial;Upper 6: Right;Lateral;Upper 07/23/16  --  -- 2400            Intake/Output Last 24 hours No intake or output data in the 24 hours ending 02/17/23 1431  Labs/Imaging No results found for this or any previous visit (from the past 48 hour(s)). No results found.  Pending Labs Unresulted Labs (From admission, onward)     Start     Ordered   02/24/23 0500  Creatinine, serum  (enoxaparin (LOVENOX)    CrCl >/= 30 ml/min)  Weekly,   R     Comments: while on enoxaparin therapy    02/17/23 1357   02/18/23 0500  Comprehensive metabolic panel  Tomorrow morning,   R        02/17/23 1357   02/18/23 0500  CBC  Tomorrow morning,   R        02/17/23 1357   02/18/23 0500  Magnesium  Tomorrow morning,   R        02/17/23 1357   02/18/23 0500  Phosphorus  Tomorrow morning,   R        02/17/23 1357   02/18/23 0500  Folate  Tomorrow morning,   R        02/17/23 1357   02/17/23 1358  Vitamin B12  Once,   R        02/17/23 1357   02/17/23 1358  TSH  Once,   R        02/17/23 1357   02/17/23 1357  HIV Antibody (routine testing w rflx)  (HIV Antibody (Routine testing w reflex) panel)  Once,   R        02/17/23 1357   02/17/23 1357  CBC  (enoxaparin (LOVENOX)    CrCl >/= 30 ml/min)  Once,   R       Comments: Baseline for enoxaparin therapy IF NOT ALREADY DRAWN.  Notify MD if PLT < 100 K.    02/17/23 1357   02/17/23 1357  Creatinine, serum  (enoxaparin (LOVENOX)    CrCl >/= 30 ml/min)  Once,   R       Comments: Baseline for enoxaparin therapy IF NOT ALREADY DRAWN.    02/17/23 1357            Vitals/Pain Today's Vitals    02/17/23 1245 02/17/23 1300 02/17/23 1330 02/17/23 1345  BP: (!) 141/97 (!) 143/98 131/74 133/85  Pulse: 78 87 83   Resp: (!) 21 (!) 28 19 16   Temp:      TempSrc:      SpO2: 94% 96% 96%   Weight:      Height:  PainSc:        Isolation Precautions No active isolations  Medications Medications  LORazepam (ATIVAN) tablet 1-4 mg (has no administration in time range)    Or  LORazepam (ATIVAN) tablet 1 mg (has no administration in time range)  thiamine (VITAMIN B1) tablet 100 mg (has no administration in time range)    Or  thiamine (VITAMIN B1) injection 100 mg (has no administration in time range)  folic acid (FOLVITE) tablet 1 mg (has no administration in time range)  multivitamin with minerals tablet 1 tablet (has no administration in time range)  lactated ringers bolus 1,000 mL (has no administration in time range)  aspirin EC tablet 81 mg (has no administration in time range)  metoprolol tartrate (LOPRESSOR) tablet 25 mg (has no administration in time range)  pantoprazole (PROTONIX) EC tablet 40 mg (has no administration in time range)  enoxaparin (LOVENOX) injection 40 mg (has no administration in time range)  0.9 %  sodium chloride infusion (has no administration in time range)  acetaminophen (TYLENOL) tablet 650 mg (has no administration in time range)    Or  acetaminophen (TYLENOL) suppository 650 mg (has no administration in time range)  ondansetron (ZOFRAN) tablet 4 mg (has no administration in time range)    Or  ondansetron (ZOFRAN) injection 4 mg (has no administration in time range)  ipratropium-albuterol (DUONEB) 0.5-2.5 (3) MG/3ML nebulizer solution 3 mL (has no administration in time range)  hydrALAZINE (APRESOLINE) injection 10 mg (has no administration in time range)  metoprolol tartrate (LOPRESSOR) injection 5 mg (has no administration in time range)  senna-docusate (Senokot-S) tablet 1 tablet (has no administration in time range)  guaiFENesin (ROBITUSSIN)  100 MG/5ML liquid 5 mL (has no administration in time range)  chlordiazePOXIDE (LIBRIUM) capsule 25 mg (has no administration in time range)    Followed by  chlordiazePOXIDE (LIBRIUM) capsule 25 mg (has no administration in time range)    Followed by  chlordiazePOXIDE (LIBRIUM) capsule 25 mg (has no administration in time range)    Mobility walks      R Recommendations: See Admitting Provider Note  Report given to: 2W11

## 2023-02-17 NOTE — ED Notes (Addendum)
Pt had shower, dressed up, strip his bed, took his belongings and started heading towards the door. When asked where he is going, stated he is going to get his bag from the locked to [place] Pt is oriented to person, place, not time and situation. Writer redirected pt to letting him know that he is not discharged yet. Pt is sent to his room and supplies provided again to ensure comfort. Will continue to provide support and monitor for safety.

## 2023-02-17 NOTE — ED Notes (Signed)
Pt came to the nursing station again asking for his hearing aid, when given the hearing aid, he asked for the charger as well, saying he does not want to forget it when leaving. Writer explained to pt that he cannot have the charger because of the cord attached to it and also, they are no plugs in the room for him to use it.

## 2023-02-17 NOTE — ED Triage Notes (Addendum)
Pt BIB [facility] from [facility] withdrawal. He has been there since Sunday and last drink was on Sunday.Staff noted change in his mental status. He was having vision hallucination. He will answer question then stare off into space. EMS reported he will say something random. Saff also notice he has tremors in his hands, which they noted this was new. No medication was given by EMS. No N/V, C/P or shortness of breath. Vital signs: BP 132/74,  RR 20, HR 86, saturation 97% RA and CBG 116. EMS reported he is A & O x3 , but time unaware.

## 2023-02-17 NOTE — ED Provider Notes (Signed)
FBC/OBS ASAP Discharge Summary  Date: 02/17/23 Name: Austin Houston MRN: [id number]  Discharge Diagnoses:  Final diagnoses:  Alcohol withdrawal syndrome without complication (HCC)  Alcohol use disorder, severe   Austin Houston is a 63 y.o., male with no past psychiatric history and substance use history of alcohol use disorder  who presents to the Facility Based Crisis center from behavioral health urgent care [facility]) for evaluation and management of alcohol withdrawal and detox.   Subjective: patient says he wants to leave. He does not know where he is right now  Stay Summary: During the patient's hospitalization, patient had extensive initial psychiatric evaluation, and follow-up psychiatric evaluations every day.  The following meds were provided and managed during his stay. Scheduled Meds:  aspirin EC  81 mg Oral Daily   fluticasone  1 spray Each Nare Daily   loratadine  10 mg Oral Daily   LORazepam  1 mg Oral TID   Followed by   LORazepam  1 mg Oral BID   Followed by   Melene Muller ON 02/19/2023] LORazepam  1 mg Oral Daily   magnesium oxide  400 mg Oral Daily   metoprolol tartrate  25 mg Oral BID   mometasone-formoterol  2 puff Inhalation BID   multivitamin with minerals  1 tablet Oral Daily   pantoprazole  40 mg Oral Daily   polyvinyl alcohol  2 drop Both Eyes BID   thiamine  100 mg Oral Daily   Continuous Infusions: PRN Meds:.acetaminophen, albuterol, alum & mag hydroxide-simeth, diclofenac Sodium, haloperidol **AND** LORazepam **AND** diphenhydrAMINE, haloperidol lactate **AND** LORazepam **AND** diphenhydrAMINE, hydrOXYzine, LORazepam, melatonin, ondansetron, polyethylene glycol, senna  Patient was receiving treatment for alcohol withdrawal symptoms but is at high risk of developing delirium tremens and will be transferred to [facility] ED.  Total Time spent with patient: 1.5 hours  Past Psychiatric History: denies  Past Medical History: Ascending aortic aneurysm s/p  repair Family History: "everyone in my family drinks" Social History: drinks 6-8 alcoholic drinks daily. Married and lives with wife. 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 Tobacco Cessation:  N/A, patient does not currently use tobacco products  Current Medications:  Current Facility-Administered Medications  Medication Dose Route Frequency Provider Last Rate Last Admin   acetaminophen (TYLENOL) tablet 650 mg  650 mg Oral Q6H PRN Augusto Gamble, MD       albuterol (VENTOLIN HFA) 108 (90 Base) MCG/ACT inhaler 2 puff  2 puff Inhalation Q6H PRN Rankin, Shuvon B, NP       alum & mag hydroxide-simeth (MAALOX/MYLANTA) 200-200-20 MG/5ML suspension 30 mL  30 mL Oral Q4H PRN Augusto Gamble, MD       aspirin EC tablet 81 mg  81 mg Oral Daily Rankin, Shuvon B, NP   81 mg at 02/17/23 0848   diclofenac Sodium (VOLTAREN) 1 % topical gel 2 g  2 g Topical Daily PRN Rankin, Shuvon B, NP       haloperidol (HALDOL) tablet 5 mg  5 mg Oral Q6H PRN Augusto Gamble, MD   5 mg at 02/17/23 4098   And   LORazepam (ATIVAN) tablet 1 mg  1 mg Oral Q6H PRN Augusto Gamble, MD   1 mg at 02/17/23 1191   And   diphenhydrAMINE (BENADRYL) capsule 25 mg  25 mg Oral Q6H PRN Augusto Gamble, MD   25 mg at 02/17/23 0517   haloperidol lactate (HALDOL) injection 5 mg  5 mg Intramuscular Q6H PRN Augusto Gamble, MD  And   LORazepam (ATIVAN) injection 1 mg  1 mg Intravenous Q6H PRN Augusto Gamble, MD       And   diphenhydrAMINE (BENADRYL) injection 25 mg  25 mg Intramuscular Q6H PRN Augusto Gamble, MD       fluticasone (FLONASE) 50 MCG/ACT nasal spray 1 spray  1 spray Each Nare Daily Rankin, Shuvon B, NP   1 spray at 02/17/23 0849   hydrOXYzine (ATARAX) tablet 25 mg  25 mg Oral TID PRN Augusto Gamble, MD       loratadine (CLARITIN) tablet 10 mg  10 mg Oral Daily Rankin, Shuvon B, NP   10 mg at 02/17/23 0848   LORazepam (ATIVAN) tablet 1 mg  1 mg Oral TID Augusto Gamble, MD       Followed by   LORazepam (ATIVAN) tablet 1 mg  1 mg Oral BID Augusto Gamble, MD        Followed by   Melene Muller ON 02/19/2023] LORazepam (ATIVAN) tablet 1 mg  1 mg Oral Daily Augusto Gamble, MD       LORazepam (ATIVAN) tablet 1-4 mg  1-4 mg Oral Q4H PRN Augusto Gamble, MD   2 mg at 02/17/23 0400   magnesium oxide (MAG-OX) tablet 400 mg  400 mg Oral Daily Rankin, Shuvon B, NP   400 mg at 02/17/23 0848   melatonin tablet 3 mg  3 mg Oral QHS PRN Augusto Gamble, MD       metoprolol tartrate (LOPRESSOR) tablet 25 mg  25 mg Oral BID Rankin, Shuvon B, NP   25 mg at 02/17/23 0848   mometasone-formoterol (DULERA) 200-5 MCG/ACT inhaler 2 puff  2 puff Inhalation BID Rankin, Shuvon B, NP   2 puff at 02/17/23 0849   multivitamin with minerals tablet 1 tablet  1 tablet Oral Daily Rankin, Shuvon B, NP   1 tablet at 02/17/23 0848   ondansetron (ZOFRAN) tablet 8 mg  8 mg Oral Q8H PRN Augusto Gamble, MD       pantoprazole (PROTONIX) EC tablet 40 mg  40 mg Oral Daily Rankin, Shuvon B, NP   40 mg at 02/17/23 0849   polyethylene glycol (MIRALAX / GLYCOLAX) packet 17 g  17 g Oral Daily PRN Augusto Gamble, MD       polyvinyl alcohol (LIQUIFILM TEARS) 1.4 % ophthalmic solution 2 drop  2 drop Both Eyes BID Augusto Gamble, MD   2 drop at 02/17/23 0850   senna (SENOKOT) tablet 8.6 mg  1 tablet Oral QHS PRN Augusto Gamble, MD       thiamine (VITAMIN B1) tablet 100 mg  100 mg Oral Daily Rankin, Shuvon B, NP   100 mg at 02/17/23 1610   Current Outpatient Medications  Medication Sig Dispense Refill   albuterol (VENTOLIN HFA) 108 (90 Base) MCG/ACT inhaler INHALE 2 PUFFS INTO THE LUNGS EVERY 6 (SIX) HOURS AS NEEDED FOR WHEEZING. (Patient taking differently: Inhale 2 puffs into the lungs every 6 (six) hours as needed for wheezing or shortness of breath.) 20.1 g 3   aspirin EC 81 MG tablet Take 81 mg by mouth daily. Swallow whole.     budesonide-formoterol (SYMBICORT) 160-4.5 MCG/ACT inhaler Inhale 2 puffs into the lungs in the morning and at bedtime. 10.2 g 5   Carboxymethylcellul-Glycerin (LUBRICATING EYE DROPS OP) Apply 2 drops to eye  daily as needed (dry eyes).     cetirizine (ZYRTEC) 10 MG tablet Take 10 mg by mouth daily.     diclofenac Sodium (VOLTAREN) 1 %  GEL Apply 1 a small amount to affected area twice a day (Patient taking differently: Apply 1 Application topically 4 (four) times daily as needed (For pain).) 100 g 0   fluticasone (FLONASE) 50 MCG/ACT nasal spray Place 2 sprays into both nostrils daily. (Patient taking differently: Place 1 spray into both nostrils daily.) 16 g 6   magnesium oxide (MAG-OX) 400 (240 Mg) MG tablet Take 400 mg by mouth daily.     metoprolol tartrate (LOPRESSOR) 25 MG tablet Take 1 tablet (25 mg total) by mouth 2 (two) times daily. (Patient taking differently: Take 50 mg by mouth daily.) 180 tablet 3   montelukast (SINGULAIR) 10 MG tablet Take 1 tablet (10 mg total) by mouth at bedtime. (Patient taking differently: Take 10 mg by mouth daily.) 30 tablet 5   Multiple Vitamin (MULTIVITAMIN WITH MINERALS) TABS tablet Take 1 tablet by mouth daily.     naltrexone (DEPADE) 50 MG tablet Take 50 mg by mouth daily.     omeprazole (PRILOSEC) 40 MG capsule Take 1 capsule (40 mg total) by mouth daily. 90 capsule 3   ondansetron (ZOFRAN) 4 MG tablet Take 1 tablet (4 mg total) by mouth every 8 (eight) hours as needed for nausea or vomiting. 20 tablet 2   promethazine (PHENERGAN) 25 MG suppository Place 1 suppository (25 mg total) rectally every 6 (six) hours as needed for nausea or vomiting. 12 each 2   tadalafil (CIALIS) 5 MG tablet Take 5 mg by mouth daily.      PTA Medications: (Not in a hospital admission)      02/16/2023   10:17 AM 02/15/2023   10:39 AM 12/12/2020    8:45 AM  Depression screen PHQ 2/9  Decreased Interest 0 1 0  Down, Depressed, Hopeless 0 1 0  PHQ - 2 Score 0 2 0  Altered sleeping  1 0  Tired, decreased energy  1 0  Change in appetite  1 0  Feeling bad or failure about yourself   1 0  Trouble concentrating  1 0  Moving slowly or fidgety/restless  1 0  Suicidal thoughts  0 0   PHQ-9 Score  8 0  Difficult doing work/chores  Somewhat difficult Not difficult at all    Flowsheet Row ED from 02/15/2023 in [facility] Most recent reading at 02/15/2023  8:55 PM ED from 02/15/2023 in [facility] Most recent reading at 02/15/2023  9:50 AM ED from 11/05/2022 in [facility] [place]  Most recent reading at 11/05/2022  2:59 PM  C-SSRS RISK CATEGORY No Risk No Risk No Risk       Musculoskeletal  Strength & Muscle Tone: within normal limits Gait & Station: normal Patient leans: N/A   Psychiatric Specialty Exam  Presentation General Appearance: Appropriate for Environment; Well Groomed   Eye Contact:Good   Speech:Clear and Coherent; Normal Rate   Speech Volume:Normal   Handedness:Right    Mood and Affect  Mood:-- ("I want to go home")   Affect:Appropriate; Congruent; Full Range    Thought Process  Thought Processes:Coherent; Linear; Goal Directed   Descriptions of Associations:Intact   Orientation:None (disoriented)   Thought Content:WDL; Logical   Diagnosis of Schizophrenia or Schizoaffective disorder in past: No    Hallucinations:Hallucinations: None   Ideas of Reference:None   Suicidal Thoughts:Suicidal Thoughts: No   Homicidal Thoughts:Homicidal Thoughts: No    Sensorium  Memory:Immediate Good; Recent Good; Remote Good   Judgment:Good   Insight:Good  Executive Functions  Concentration:Good   Attention Span:Good   Recall:Good   Fund of Knowledge:Good   Language:Good   Psychomotor Activity  Psychomotor Activity:Psychomotor Activity: Normal   Assets  Assets:Resilience   Sleep  Sleep:Sleep: Poor   Nutritional Assessment (For OBS and FBC admissions only) Has the patient had a weight loss or gain of 10 pounds or more in the last 3 months?: No Has the patient had a decrease in food intake/or appetite?: No Does the patient  have eating habits or behaviors that may be indicators of an eating disorder including binging or inducing vomiting?: No Has the patient recently lost weight without trying?: 0 Has the patient been eating poorly because of a decreased appetite?: 0 Malnutrition Screening Tool Score: 0    Physical Exam  Physical Exam Vitals and nursing note reviewed.  HENT:     Head: Normocephalic and atraumatic.  Pulmonary:     Effort: Pulmonary effort is normal.  Musculoskeletal:     Cervical back: Normal range of motion.  Neurological:     General: No focal deficit present.     Mental Status: He is alert. He is disoriented.     Comments: tremors    Review of Systems  Constitutional: Negative.   Respiratory: Negative.    Cardiovascular: Negative.   Gastrointestinal: Negative.   Genitourinary: Negative.   Psychiatric/Behavioral:         Psychiatric subjective data addressed in PSE or HPI / daily subjective report   Blood pressure (!) 135/98, pulse 74, temperature 98 F (36.7 C), resp. rate 20, SpO2 99%. There is no height or weight on file to calculate BMI.  Demographic Factors:  Male and Caucasian  Loss Factors: Decline in physical health  Historical Factors: Family history of mental illness or substance abuse  Risk Reduction Factors:   Positive social support  Continued Clinical Symptoms:  Alcohol/Substance Abuse/Dependencies  Cognitive Features That Contribute To Risk:  None    Suicide Risk:  Mild:  Suicidal ideation of limited frequency, intensity, duration, and specificity.  There are no identifiable plans, no associated intent, mild dysphoria and related symptoms, good self-control (both objective and subjective assessment), few other risk factors, and identifiable protective factors, including available and accessible social support.  Plan Of Care/Follow-up recommendations:  Activity: as tolerated  Diet: heart healthy  Other: -Follow-up with your outpatient  psychiatric provider -instructions on appointment date, time, and address (location) are provided to you in discharge paperwork.  -Take your psychiatric medications as prescribed at discharge - instructions are provided to you in the discharge paperwork  -Follow-up with outpatient primary care doctor and other specialists -for management of chronic medical disease, including: health maintenance checks and HLD, ALI  -Testing: Follow-up with outpatient provider for abnormal lab results: elevated lipids and LFTs  -Recommend abstinence from alcohol, tobacco, and other illicit drug use at discharge.   -If your psychiatric symptoms recur, worsen, or if you have side effects to your psychiatric medications, call your outpatient psychiatric provider, 911, [phone] or go to the nearest emergency department.  -If suicidal thoughts recur, call your outpatient psychiatric provider, 911, [phone] or go to the nearest emergency department.  Disposition:  [facility]  Augusto Gamble, MD 02/17/2023, 11:01 AM

## 2023-02-17 NOTE — ED Notes (Signed)
Pt is   confused to date,time and day of week he keep asking to go home

## 2023-02-17 NOTE — ED Notes (Signed)
Pt is very anxious, confused he has been constantly coming out of room with his clothes stating he is going home, he need to call his wife she is at the vacation home. He does not understand the conversation that is being talked to him.

## 2023-02-17 NOTE — Progress Notes (Signed)
Patient is pleasant and friendly in conversation.  Per report by staff, he has taken two showers during the night.  Staff reports that patient appears to be confused, and had approached the nursing desk several times with his belongings, thinking it was time to leave.  Verbal reorientation and redirection was short lasting.    Patient assessed by this writer at 0340 hours with the following findings:  CIWA of 21  Patient has visible tremors, his palms are sweaty and he admits to taking two showers due to waking up sweating all over, then having chills.  He is restless, fidgety and will continue to pace unless distracted with conversation.  He is moderately confused - he will answer questions with inappropriate answers:    Question:  Do you think it will rain tomorrow? Answer: Well, my doctor that I see prescribes my medications that I take.  Question: So you drink half a fifth of liquor a day? Answer: Yes, I am taking half a fifth of the ativan a day.  He reports that he has not vomited during the night but has been "a little nauseated".  He reports that he has a "little bit" of a headache.  He said, "To be honest, I am a little agitated."  But then unable to offer a reason why.  Patient's blood pressure at 0335 was 181/98 and pulse of 78.  Patient reports that his normal resting heart rate is 47 to 50.  When asked if he was seeing any shadows in his peripheral vision or any shapes that stood out as odd, he replied, "Well, now that you mention it.  I have been seeing some dark areas kind of moving about out of the corner of my eye tonight."  Patient requires frequent redirection back to the current conversation due to confusion, but remains pleasant and polite in the verbal interaction with this Clinical research associate.  CIWA Ativan administered post CIWA assessment.

## 2023-02-17 NOTE — Progress Notes (Signed)
Patient is very confuse  at this time. Trying to jump out of the bed every couple minutes.  Unhooking his I/V, turning  the pump off, taking his tele off, and putting side rails down. 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 I have already  got enough fluids for my dehydration, I am going to find some gun and come back". ". PRN Ativan was given and that didn't help him at all. MD aware. No new order receive  at this time. Primary nurse is sitting with him since he got admitted.

## 2023-02-17 NOTE — Progress Notes (Signed)
Report was given to [place], [facility] CN.

## 2023-02-17 NOTE — Progress Notes (Signed)
Pt was transferred to [facility] for AMS per MD order. Pt was transferred via EMS. Pt's spouse, Seward Wingrove [phone] was notified of transfer.

## 2023-02-17 NOTE — Plan of Care (Signed)

## 2023-02-17 NOTE — ED Notes (Signed)
Pt is confused, believes yesterday is Tuesday and today is yesterday.

## 2023-02-17 NOTE — ED Provider Notes (Signed)
[place] EMERGENCY DEPARTMENT AT [facility] Provider Note   CSN: [id number] Arrival date & time: 02/17/23  1205     History  No chief complaint on file.   Austin Houston is a 62 y.o. male.  Pt from [facility] with alcohol withdrawal and altered mental status. Patient has been at [facility] for past two days for etoh use disorder treatment. Last drink 2-3 days ago. Denies hx dts or seizures. Pt noted earlier with confusion, ?delirium, and with visual hallucinations. Pt given extra dose of ativan there. Pt now awake and alert, not actively hallucinating. Denies headache. No change in speech or vision. No numbness/weakness. No chest pain or sob. No abd pain or vomiting. No fever or chills.   The history is provided by the patient, medical records and the EMS personnel. The history is limited by the condition of the patient.       Home Medications Prior to Admission medications   Medication Sig Start Date End Date Taking? Authorizing Provider  albuterol (VENTOLIN HFA) 108 (90 Base) MCG/ACT inhaler INHALE 2 PUFFS INTO THE LUNGS EVERY 6 (SIX) HOURS AS NEEDED FOR WHEEZING. Patient taking differently: Inhale 2 puffs into the lungs every 6 (six) hours as needed for wheezing or shortness of breath. 05/20/22 05/20/23  Copland, Karleen Hampshire, MD  aspirin EC 81 MG tablet Take 81 mg by mouth daily. Swallow whole.    [provider]  budesonide-formoterol (SYMBICORT) 160-4.5 MCG/ACT inhaler Inhale 2 puffs into the lungs in the morning and at bedtime. 08/25/21   Mannam, Colbert Coyer, MD  Carboxymethylcellul-Glycerin (LUBRICATING EYE DROPS OP) Apply 2 drops to eye daily as needed (dry eyes).    [provider]  cetirizine (ZYRTEC) 10 MG tablet Take 10 mg by mouth daily.    [provider]  diclofenac Sodium (VOLTAREN) 1 % GEL Apply 1 a small amount to affected area twice a day Patient taking differently: Apply 1 Application topically 4 (four) times daily as needed (For pain). 01/01/22      fluticasone (FLONASE) 50 MCG/ACT nasal spray Place 2 sprays into both nostrils daily. Patient taking differently: Place 1 spray into both nostrils daily. 10/30/20   Jannifer Rodney A, FNP  magnesium oxide (MAG-OX) 400 (240 Mg) MG tablet Take 400 mg by mouth daily.    [provider]  metoprolol tartrate (LOPRESSOR) 25 MG tablet Take 1 tablet (25 mg total) by mouth 2 (two) times daily. Patient taking differently: Take 50 mg by mouth daily. 12/30/22   Iran Ouch, MD  montelukast (SINGULAIR) 10 MG tablet Take 1 tablet (10 mg total) by mouth at bedtime. Patient taking differently: Take 10 mg by mouth daily. 01/19/23   Chilton Greathouse, MD  Multiple Vitamin (MULTIVITAMIN WITH MINERALS) TABS tablet Take 1 tablet by mouth daily.    [provider]  naltrexone (DEPADE) 50 MG tablet Take 50 mg by mouth daily. 02/01/23   [provider]  omeprazole (PRILOSEC) 40 MG capsule Take 1 capsule (40 mg total) by mouth daily. 04/19/22   Copland, Karleen Hampshire, MD  ondansetron (ZOFRAN) 4 MG tablet Take 1 tablet (4 mg total) by mouth every 8 (eight) hours as needed for nausea or vomiting. 04/22/22   Copland, Karleen Hampshire, MD  promethazine (PHENERGAN) 25 MG suppository Place 1 suppository (25 mg total) rectally every 6 (six) hours as needed for nausea or vomiting. 12/22/21   Copland, Karleen Hampshire, MD  tadalafil (CIALIS) 5 MG tablet Take 5 mg by mouth daily. 01/21/23   [provider]  atorvastatin (LIPITOR) 20 MG tablet Take 1 tablet (20 mg total) by mouth daily. 09/04/22 09/04/22  Nahser, Deloris Ping, MD  beclomethasone (QVAR) 80 MCG/ACT inhaler Inhale 2 puffs into the lungs daily. 04/27/18 12/19/19  [name], MD      Allergies    Vioxx [rofecoxib], Celebrex [celecoxib], Codeine, and Doxycycline    Review of Systems   Review of Systems  Constitutional:  Negative for fever.  HENT:  Negative for sore throat.   Eyes:  Negative for visual disturbance.  Respiratory:  Negative for shortness of  breath.   Cardiovascular:  Negative for chest pain.  Gastrointestinal:  Negative for abdominal pain and vomiting.  Genitourinary:  Negative for flank pain.  Musculoskeletal:  Negative for back pain and neck pain.  Skin:  Negative for rash.  Neurological:  Negative for headaches.  Psychiatric/Behavioral:  Positive for confusion and hallucinations.     Physical Exam Updated Vital Signs BP 132/83 (BP Location: Right Arm)   Pulse 79   Temp 98.3 F (36.8 C) (Oral)   Resp 15   Ht 1.88 m (6\' 2" )   Wt 93 kg   SpO2 99%   BMI 26.32 kg/m  Physical Exam Vitals and nursing note reviewed.  Constitutional:      Appearance: Normal appearance. He is well-developed.  HENT:     Head: Atraumatic.     Nose: Nose normal.     Mouth/Throat:     Mouth: Mucous membranes are moist.     Pharynx: Oropharynx is clear.  Eyes:     General: No scleral icterus.    Conjunctiva/sclera: Conjunctivae normal.     Pupils: Pupils are equal, round, and reactive to light.  Neck:     Vascular: No carotid bruit.     Trachea: No tracheal deviation.     Comments: No stiffness or rigidity.  Cardiovascular:     Rate and Rhythm: Normal rate and regular rhythm.     Pulses: Normal pulses.     Heart sounds: Normal heart sounds. No murmur heard.    No friction rub. No gallop.  Pulmonary:     Effort: Pulmonary effort is normal. No accessory muscle usage or respiratory distress.     Breath sounds: Normal breath sounds.  Abdominal:     General: Bowel sounds are normal. There is no distension.     Palpations: Abdomen is soft.     Tenderness: There is no abdominal tenderness.  Genitourinary:    Comments: No cva tenderness. Musculoskeletal:        General: No swelling or tenderness.     Cervical back: Normal range of motion and neck supple. No rigidity.  Skin:    General: Skin is warm and dry.     Findings: No rash.  Neurological:     Mental Status: He is alert.     Comments: Alert, speech clear. Motor/sens  grossly intact bil. Mildly tremulous. Pt is oriented but makes occasional confused/delusional statements.   Psychiatric:        Mood and Affect: Mood normal.     ED Results / Procedures / Treatments   Labs (all labs ordered are listed, but only abnormal results are displayed) Results for orders placed or performed during the hospital encounter of 02/15/23  Comprehensive metabolic panel  Result Value Ref Range   Sodium 136 135 - 145 mmol/L   Potassium 3.7 3.5 - 5.1 mmol/L   Chloride 93 (L) 98 - 111 mmol/L   CO2 30 22 - 32  mmol/L   Glucose, Bld 92 70 - 99 mg/dL   BUN 11 6 - 20 mg/dL   Creatinine, Ser 1.82 (H) 0.61 - 1.24 mg/dL   Calcium 99.3 8.9 - 71.6 mg/dL   Total Protein 7.1 6.5 - 8.1 g/dL   Albumin 4.1 3.5 - 5.0 g/dL   AST 967 (H) 15 - 41 U/L   ALT 121 (H) 0 - 44 U/L   Alkaline Phosphatase 55 38 - 126 U/L   Total Bilirubin 2.3 (H) 0.3 - 1.2 mg/dL   GFR, Estimated >89 >38 mL/min   Anion gap 13 5 - 15  Lipid panel  Result Value Ref Range   Cholesterol 263 (H) 0 - 200 mg/dL   Triglycerides 73 <101 mg/dL   HDL 751 >02 mg/dL   Total CHOL/HDL Ratio 2.4 RATIO   VLDL 15 0 - 40 mg/dL   LDL Cholesterol 585 (H) 0 - 99 mg/dL    EKG None  Radiology No results found.  Procedures Procedures    Medications Ordered in ED Medications  LORazepam (ATIVAN) tablet 1-4 mg (has no administration in time range)    Or  LORazepam (ATIVAN) tablet 1 mg (has no administration in time range)  thiamine (VITAMIN B1) tablet 100 mg (has no administration in time range)    Or  thiamine (VITAMIN B1) injection 100 mg (has no administration in time range)  folic acid (FOLVITE) tablet 1 mg (has no administration in time range)  multivitamin with minerals tablet 1 tablet (has no administration in time range)    ED Course/ Medical Decision Making/ A&P                                 Medical Decision Making Problems Addressed: Alcohol use disorder: chronic illness or injury with  exacerbation, progression, or side effects of treatment that poses a threat to life or bodily functions Alcohol withdrawal delirium, acute, mixed level of activity (HCC): acute illness or injury with systemic symptoms that poses a threat to life or bodily functions Chronic alcoholism (HCC): chronic illness or injury with exacerbation, progression, or side effects of treatment that poses a threat to life or bodily functions  Amount and/or Complexity of Data Reviewed Independent Historian: EMS    Details: hx External Data Reviewed: labs and notes. Labs:  Decision-making details documented in ED Course. Discussion of management or test interpretation with external provider(s): medicine  Risk OTC drugs. Prescription drug management. Decision regarding hospitalization.   Iv ns. Continuous pulse ox and cardiac monitoring.   Differential diagnosis includes alcohol withdrawal/delirium, etc. Dispo decision including potential need for admission considered - will get labs and imaging and reassess.   Reviewed nursing notes and prior charts for additional history. External reports reviewed. Additional history from: EMS.   Cardiac monitor: sinus rhythm, rate 80.  Labs from earlier today reviewed/interpreted by me - chem normal.   CIWA protocol ordered. Pt received ativan 2 mg shortly pta and that appears to have improved symptoms.   Given in period 48-72 hours post last etoh (and potential peak of etoh withdrawal symptoms), and confusion/altered ms not able to be managed at [facility], will consult medicine for admission, tx etoh withdrawal.   CRITICAL CARE RE: alcohol withdrawal w delirium/hallucinations reqiuring ciwa/bzd therapy,  Performed by: Suzi Roots Total critical care time: 40 minutes Critical care time was exclusive of separately billable procedures and treating other patients. Critical care was necessary to  treat or prevent imminent or life-threatening deterioration. Critical care  was time spent personally by me on the following activities: development of treatment plan with patient and/or surrogate as well as nursing, discussions with consultants, evaluation of patient's response to treatment, examination of patient, obtaining history from patient or surrogate, ordering and performing treatments and interventions, ordering and review of laboratory studies, ordering and review of radiographic studies, pulse oximetry and re-evaluation of patient's condition.          Final Clinical Impression(s) / ED Diagnoses Final diagnoses:  None    Rx / DC Orders ED Discharge Orders     None         Cathren Laine, MD 02/17/23 1326

## 2023-02-17 NOTE — ED Notes (Signed)
Pt walked to the staff bathroom and stated he was going to get some water he was re directed to the couch

## 2023-02-17 NOTE — ED Notes (Signed)
Pt reassessed@445  for affects of Ativan 2mg  po administered at 4am: affects minimal pt remains anxious, restless, slightly agitated with continuing confusion.

## 2023-02-17 NOTE — Progress Notes (Signed)
Patient arrived to the unit via bed from ED. Alert to self. VSS. Wife is at the bedside. Placed on tele. Oriented patient to the room and staff. Education provided regarding safety precaution.

## 2023-02-17 NOTE — Progress Notes (Signed)
Night shift provider R. Mayford Knife NP notified of CIWA assessment findings.

## 2023-02-17 NOTE — Progress Notes (Signed)
Administered one time order Lorazepam 2mg   per MD order. Will continue to monitor.

## 2023-02-17 NOTE — H&P (Signed)
History and Physical    OTHA KELLNER GEX:[id number] DOB: Nov 07, 1960 DOA: 02/17/2023  PCP: Hannah Beat, MD Patient coming from: Behavioral health  Chief Complaint: Alcohol withdrawal and change in mental status  HPI: Austin Houston is a 62 y.o. male with medical history significant of postop atrial fibrillation not on anticoagulation, HTN, alcohol abuse comes from [facility] for alcohol withdrawal.  Patient has been seeking treatment at [facility] for the past couple of days but earlier today started having confusion increasing hallucination and delirium therefore he was sent to the hospital.  He was also given extra dose of Ativan without any relief. Spouse and patient confirms that he drinks at least 1.75 L vodka and few beers every 3-4 days.  He has been drinking for least 30 years but recently more so.  He is try to see counselor in the past but has not really been successful with quitting.  In the ER patient was started on alcohol withdrawal protocol.  Lab work showed transaminitis.  Medical team was requested to admit the patient given his active symptoms and high risk for significant decompensation and withdrawls.   Review of Systems: As per HPI otherwise 10 point review of systems negative.  Review of Systems Otherwise negative except as per HPI, including: General: Denies fever, chills, night sweats or unintended weight loss. Resp: Denies cough, wheezing, shortness of breath. Cardiac: Denies chest pain, palpitations, orthopnea, paroxysmal nocturnal dyspnea. GI: Denies abdominal pain, nausea, vomiting, diarrhea or constipation GU: Denies dysuria, frequency, hesitancy or incontinence MS: Denies muscle aches, joint pain or swelling Neuro: Denies headache, neurologic deficits (focal weakness, numbness, tingling), abnormal gait Psych: Denies anxiety, depression, SI/HI/AVH Skin: Denies new rashes or lesions ID: Denies sick contacts, exotic exposures, travel  Past Medical History:   Diagnosis Date   Actinic keratosis    Allergic rhinitis due to pollen    Asthma    vs COPD- taking inhalers- as related to allergies only.   Basal cell carcinoma 03/26/2020   R lat deltoid - ED&C    Dysplastic nevus 03/07/2020   L costal infrapectoral - moderate   Dysplastic nevus 03/07/2020   R post flank above waistline - moderate   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HCC)    s/p partial thyroidectomy (follicular adenoma)-surgery only   GERD (gastroesophageal reflux disease)    Hearing impaired person, bilateral    hearing aida bilateral"high frequency loss   Hyperlipidemia    Hypertension    Prostate cancer (HCC) 01/08/2016   PTSD (post-traumatic stress disorder)    s/p multiple active deployments for [employer], no medications taken currently    Past Surgical History:  Procedure Laterality Date   COLONOSCOPY W/ POLYPECTOMY     age 74 "adenoma"   INGUINAL HERNIA REPAIR     right   LEFT HEART CATH AND CORONARY ANGIOGRAPHY N/A 07/20/2022   Procedure: LEFT HEART CATH AND CORONARY ANGIOGRAPHY;  Surgeon: Tonny Bollman, MD;  Location: [facility] INVASIVE CV LAB;  Service: Cardiovascular;  Laterality: N/A;   REPLACEMENT ASCENDING AORTA N/A 09/07/2022   Procedure: REPLACEMENT ASCENDING AORTA;  Surgeon: Alleen Borne, MD;  Location: [facility] OR;  Service: Open Heart Surgery;  Laterality: N/A;  WITH CIRC ARREST   ROBOT ASSISTED LAPAROSCOPIC RADICAL PROSTATECTOMY N/A 07/23/2016   Procedure: XI ROBOTIC ASSISTED LAPAROSCOPIC RADICAL PROSTATECTOMY LEVEL 1;  Surgeon: Heloise Purpura, MD;  Location: [facility] ORS;  Service: Urology;  Laterality: N/A;   TEE WITHOUT CARDIOVERSION N/A 09/07/2022   Procedure: TRANSESOPHAGEAL ECHOCARDIOGRAM (TEE);  Surgeon:  Alleen Borne, MD;  Location: [facility] OR;  Service: Open Heart Surgery;  Laterality: N/A;   THYROIDECTOMY, PARTIAL     [name])    SOCIAL HISTORY:  reports that he has never smoked. He quit smokeless tobacco use about 10 years ago.  His smokeless tobacco use  included snuff. He reports current alcohol use of about 10.0 standard drinks of alcohol per week. He reports that he does not use drugs.  Allergies  Allergen Reactions   Vioxx [Rofecoxib] Anaphylaxis and Other (See Comments)    Tolerates aspirin and naproxen.   Celebrex [Celecoxib] Rash and Other (See Comments)    Tolerates aspirin and naproxen.   Codeine Rash and Other (See Comments)    Patient reports this happened as a child. He has tolerated Codeine since that time   Doxycycline Rash    FAMILY HISTORY: Family History  Problem Relation Age of Onset   Heart disease Paternal Grandmother    Prostate cancer Father        Recurrent x 3   Hyperlipidemia Father    Other Father        varicose veins   Hyperlipidemia Mother    Other Mother        varicose veins   Hypertension Maternal Grandmother    Hypertension Maternal Grandfather    Sudden death Brother 70   Colon cancer Neg Hx    Esophageal cancer Neg Hx    Rectal cancer Neg Hx    Stomach cancer Neg Hx      Prior to Admission medications   Medication Sig Start Date End Date Taking? Authorizing Provider  albuterol (VENTOLIN HFA) 108 (90 Base) MCG/ACT inhaler INHALE 2 PUFFS INTO THE LUNGS EVERY 6 (SIX) HOURS AS NEEDED FOR WHEEZING. Patient taking differently: Inhale 2 puffs into the lungs every 6 (six) hours as needed for wheezing or shortness of breath. 05/20/22 05/20/23  Copland, Karleen Hampshire, MD  aspirin EC 81 MG tablet Take 81 mg by mouth daily. Swallow whole.    [provider]  budesonide-formoterol (SYMBICORT) 160-4.5 MCG/ACT inhaler Inhale 2 puffs into the lungs in the morning and at bedtime. 08/25/21   Mannam, Colbert Coyer, MD  Carboxymethylcellul-Glycerin (LUBRICATING EYE DROPS OP) Apply 2 drops to eye daily as needed (dry eyes).    [provider]  cetirizine (ZYRTEC) 10 MG tablet Take 10 mg by mouth daily.    [provider]  diclofenac Sodium (VOLTAREN) 1 % GEL Apply 1 a small amount to affected area  twice a day Patient taking differently: Apply 1 Application topically 4 (four) times daily as needed (For pain). 01/01/22     fluticasone (FLONASE) 50 MCG/ACT nasal spray Place 2 sprays into both nostrils daily. Patient taking differently: Place 1 spray into both nostrils daily. 10/30/20   Jannifer Rodney A, FNP  magnesium oxide (MAG-OX) 400 (240 Mg) MG tablet Take 400 mg by mouth daily.    [provider]  metoprolol tartrate (LOPRESSOR) 25 MG tablet Take 1 tablet (25 mg total) by mouth 2 (two) times daily. Patient taking differently: Take 50 mg by mouth daily. 12/30/22   Iran Ouch, MD  montelukast (SINGULAIR) 10 MG tablet Take 1 tablet (10 mg total) by mouth at bedtime. Patient taking differently: Take 10 mg by mouth daily. 01/19/23   Chilton Greathouse, MD  Multiple Vitamin (MULTIVITAMIN WITH MINERALS) TABS tablet Take 1 tablet by mouth daily.    [provider]  naltrexone (DEPADE) 50 MG tablet Take 50 mg by mouth  daily. 02/01/23   [provider]  omeprazole (PRILOSEC) 40 MG capsule Take 1 capsule (40 mg total) by mouth daily. 04/19/22   Copland, Karleen Hampshire, MD  ondansetron (ZOFRAN) 4 MG tablet Take 1 tablet (4 mg total) by mouth every 8 (eight) hours as needed for nausea or vomiting. 04/22/22   Copland, Karleen Hampshire, MD  promethazine (PHENERGAN) 25 MG suppository Place 1 suppository (25 mg total) rectally every 6 (six) hours as needed for nausea or vomiting. 12/22/21   Copland, Karleen Hampshire, MD  tadalafil (CIALIS) 5 MG tablet Take 5 mg by mouth daily. 01/21/23   [provider]  atorvastatin (LIPITOR) 20 MG tablet Take 1 tablet (20 mg total) by mouth daily. 09/04/22 09/04/22  Nahser, Deloris Ping, MD  beclomethasone (QVAR) 80 MCG/ACT inhaler Inhale 2 puffs into the lungs daily. 04/27/18 12/19/19  [name], MD    Physical Exam: Vitals:   02/17/23 1154 02/17/23 1233 02/17/23 1234  BP: 132/83    Pulse: 79    Resp: 15    Temp: 98.3 F (36.8 C)    TempSrc: Oral     SpO2: 96% 99%   Weight:   93 kg  Height:   6\' 2"  (1.88 m)      Constitutional: NAD, calm, comfortable Eyes: PERRL, lids and conjunctivae normal ENMT: Mucous membranes are moist. Posterior pharynx clear of any exudate or lesions.Normal dentition.  Neck: normal, supple, no masses, no thyromegaly Respiratory: clear to auscultation bilaterally, no wheezing, no crackles. Normal respiratory effort. No accessory muscle use.  Cardiovascular: Regular rate and rhythm, no murmurs / rubs / gallops. No extremity edema. 2+ pedal pulses. No carotid bruits.  Abdomen: no tenderness, no masses palpated. No hepatosplenomegaly. Bowel sounds positive.  Musculoskeletal: no clubbing / cyanosis. No joint deformity upper and lower extremities. Good ROM, no contractures. Normal muscle tone.  Skin: no rashes, lesions, ulcers. No induration Neurologic: CN 2-12 grossly intact. Sensation intact, DTR normal. Strength 5/5 in all 4.  Psychiatric: Normal judgment and insight. Alert and oriented x 3. Normal mood.     Labs on Admission: I have personally reviewed following labs and imaging studies  CBC: Recent Labs  Lab 02/15/23 0950  WBC 3.4*  NEUTROABS 2.4  HGB 15.9  HCT 46.9  MCV 89.7  PLT 173   Basic Metabolic Panel: Recent Labs  Lab 02/15/23 0950 02/17/23 0613  NA 137 136  K 4.1 3.7  CL 93* 93*  CO2 22 30  GLUCOSE 175* 92  BUN 9 11  CREATININE 0.98 1.27*  CALCIUM 9.8 10.0  MG 1.7  --    GFR: Estimated Creatinine Clearance: 71.9 mL/min (A) (by C-G formula based on SCr of 1.27 mg/dL (H)). Liver Function Tests: Recent Labs  Lab 02/15/23 0950 02/17/23 0613  AST 237* 226*  ALT 132* 121*  ALKPHOS 69 55  BILITOT 1.0 2.3*  PROT 7.6 7.1  ALBUMIN 4.1 4.1   No results for input(s): "LIPASE", "AMYLASE" in the last 168 hours. No results for input(s): "AMMONIA" in the last 168 hours. Coagulation Profile: No results for input(s): "INR", "PROTIME" in the last 168 hours. Cardiac Enzymes: No  results for input(s): "CKTOTAL", "CKMB", "CKMBINDEX", "TROPONINI" in the last 168 hours. BNP (last 3 results) No results for input(s): "PROBNP" in the last 8760 hours. HbA1C: Recent Labs    02/15/23 0950  HGBA1C 5.5   CBG: No results for input(s): "GLUCAP" in the last 168 hours. Lipid Profile: Recent Labs    02/15/23 0950 02/17/23 0613  CHOL  313* 263*  HDL 125 110  LDLCALC 166* 161*  TRIG 109 73  CHOLHDL 2.5 2.4   Thyroid Function Tests: Recent Labs    02/15/23 0950  TSH 3.666   Anemia Panel: No results for input(s): "VITAMINB12", "FOLATE", "FERRITIN", "TIBC", "IRON", "RETICCTPCT" in the last 72 hours. Urine analysis:    Component Value Date/Time   COLORURINE AMBER (A) 02/15/2023 0950   APPEARANCEUR HAZY (A) 02/15/2023 0950   LABSPEC 1.015 02/15/2023 0950   PHURINE 5.0 02/15/2023 0950   GLUCOSEU NEGATIVE 02/15/2023 0950   HGBUR SMALL (A) 02/15/2023 0950   BILIRUBINUR NEGATIVE 02/15/2023 0950   KETONESUR 20 (A) 02/15/2023 0950   PROTEINUR 100 (A) 02/15/2023 0950   NITRITE NEGATIVE 02/15/2023 0950   LEUKOCYTESUR NEGATIVE 02/15/2023 0950   Sepsis Labs: !!!!!!!!!!!!!!!!!!!!!!!!!!!!!!!!!!!!!!!!!!!! @LABRCNTIP (procalcitonin:4,lacticidven:4) )No results found for this or any previous visit (from the past 240 hour(s)).   Radiological Exams on Admission: No results found.   All images have been reviewed by me personally.    Assessment/Plan Principal Problem:   Alcoholism with psychosis (HCC)    Acute alcohol withdrawal with psychosis Transaminitis - Patient comes here from behavioral unit for alcohol withdrawal symptoms.  His last drink was about 2 days ago now having confusion, delirium and visual hallucinations.  EtOH level 155.  PPI - Alcohol withdrawal protocol started, Librium taper - Multivitamin, folic acid and thiamine.  IV fluids -Will monitor LFTs  Prior history of postop atrial fibrillation - Seen by cardiology.  Recommended beta-blocker but no  need for anticoagulation  Essential hypertension - Metoprolol.  IV as needed       DVT prophylaxis: Lovenox Code Status: Full code Family Communication: Spouse at bedside Consults called: None Admission status: Telemetry admission  Status is: Inpatient Remains inpatient appropriate because: Admit to the hospital due to active alcohol withdrawal.   Time Spent: 65 minutes.  >50% of the time was devoted to discussing the patients care, assessment, plan and disposition with other care givers along with counseling the patient about the risks and benefits of treatment.     Joline Maxcy MD [facility]  If 7PM-7AM, please contact night-coverage   02/17/2023, 1:58 PM

## 2023-02-17 NOTE — Progress Notes (Addendum)
Pt continues to be confused and delirious. Pt is exhibiting visual hallucinations as evidenced by pt reaching out to the walls at the med window and Tree surgeon that the walls are moving. Pt is also requesting to be discharged and is out in the hallway with his belongings. Pt needs constant redirection. Will continue to monitor.

## 2023-02-17 NOTE — Progress Notes (Signed)
Pt appears to be RIS and is exhibiting auditory hallucinations. Administered scheduled meds per order. Will continue to monitor.

## 2023-02-17 NOTE — Progress Notes (Addendum)
Pt is exhibiting acute withdrawal delirium. Pt is tangential, confused, disoriented and is exhibiting AVH. Pt informed Clinical research associate that he heard a dog and asked if his wife said anything about the dog. Pt was informed that his wife is not in the building and was redirected. Support was given. Will continue to monitor.

## 2023-02-18 DIAGNOSIS — F10251 Alcohol dependence with alcohol-induced psychotic disorder with hallucinations: Secondary | ICD-10-CM | POA: Diagnosis not present

## 2023-02-18 MED ORDER — MAGNESIUM SULFATE 2 GM/50ML IV SOLN
2.0000 g | Freq: Once | INTRAVENOUS | Status: AC
  Administered 2023-02-18: 2 g via INTRAVENOUS
  Filled 2023-02-18: qty 50

## 2023-02-18 MED ORDER — ZIPRASIDONE MESYLATE 20 MG IM SOLR
20.0000 mg | Freq: Once | INTRAMUSCULAR | Status: AC
  Administered 2023-02-18: 20 mg via INTRAMUSCULAR
  Filled 2023-02-18: qty 20

## 2023-02-18 MED ORDER — METOPROLOL TARTRATE 25 MG PO TABS: 25.0000 mg | ORAL_TABLET | Freq: Two times a day (BID) | ORAL | Status: DC | PRN

## 2023-02-18 MED ORDER — DEXTROSE-SODIUM CHLORIDE 5-0.9 % IV SOLN: INTRAVENOUS | Status: DC

## 2023-02-18 MED ORDER — POTASSIUM CHLORIDE CRYS ER 20 MEQ PO TBCR
40.0000 meq | EXTENDED_RELEASE_TABLET | Freq: Once | ORAL | Status: AC
  Administered 2023-02-18: 40 meq via ORAL
  Filled 2023-02-18: qty 2

## 2023-02-18 MED ORDER — STERILE WATER FOR INJECTION IJ SOLN
INTRAMUSCULAR | Status: AC
  Filled 2023-02-18: qty 10

## 2023-02-18 NOTE — Progress Notes (Signed)
Patient remained asleep 90% of the day. Woke up @ 1800 and ate 1/2 of a [place] sandwich and a banana.

## 2023-02-18 NOTE — Progress Notes (Signed)
Patient remains confused, not following commands, trying to jump out of bed, pulling at lines, and removing telemetry despite CIWA ativan and Geodon x 1. Observation sitter left at 0300 without replacement. Primary RN sitting with patient.

## 2023-02-18 NOTE — Progress Notes (Signed)
PROGRESS NOTE  Austin Houston ION:[id number] DOB: 07-Apr-1961   PCP: No primary care provider on file.  Patient is from: [facility]  DOA: 02/17/2023 LOS: 1  Chief complaints No chief complaint on file.    Brief Narrative / Interim history: 62 year old M with PMH of postop A-fib not on AC, alcohol abuse, HTN presenting from [facility] with confusion, increased hallucination and delirium and admitted for alcohol withdrawal management.  Reportedly drinks about 1.75 L of vodka and a few beers every 3 to 4 days for over 30 years.  In ED, slightly hypertensive.  EtOH level 155.  CMP with mildly elevated AST and ALT consistent with alcohol.  WBC 3.4.  Started on Librium taper and CIWA with as needed Ativan, and admitted.      Subjective: Increased agitation and confusion overnight requiring and required Geodon injection and one-to-one sitter.  Slept through out the day  Objective: Vitals:   02/18/23 0437 02/18/23 0838 02/18/23 1106 02/18/23 1111  BP: (!) 188/68 (!) 157/142 (!) 169/95   Pulse: 70 78 (!) 46 (!) 58  Resp: 17 18    Temp: (!) 97.5 F (36.4 C) 98 F (36.7 C)    TempSrc: Oral Oral    SpO2: 97% 100% 100%   Weight:      Height:        Examination:  GENERAL: No apparent distress.  Nontoxic. HEENT: MMM.  Vision and hearing grossly intact.  NECK: Supple.  No apparent JVD.  RESP:  No IWOB.  Fair aeration bilaterally. CVS:  RRR. Heart sounds normal.  ABD/GI/GU: BS+. Abd soft, NTND.  MSK/EXT:  Moves extremities. No apparent deformity. No edema.  SKIN: no apparent skin lesion or wound NEURO: Sleep. Wakes to voice and falls sleep. No apparent focal neuro deficit but limited exam  Procedures:  None  Microbiology summarized: None  Assessment and plan: Principal Problem:   Alcoholism with psychosis (HCC)  Acute alcohol withdrawal with psychosis: EtOH level 155.  Increased confusion and agitation overnight.  Required Geodon injection and Recruitment consultant.  Was sleeping this morning.   -Continue CIWA with as needed Ativan -Continue Librium taper -Continue multivitamin, thiamine, folic acid -Continue IV fluid -TOC consult  Transaminitis: Likely due to EtOH. -Continue monitoring   History of postop A-fib:  not on AC.  Now with sinus bradycardia -Discontinue scheduled metoprolol  Essential hypertension -IV metoprolol as needed  Hypokalemia/hypomagnesemia -Monitor and replenish as appropriate  Pancytopenia: Due to alcohol. -Continue monitoring  History of asthma -Continue home inhalers  History of positive cancer -Outpatient follow-up  PTSD: Does not seem to be on meds.     Body mass index is 26.32 kg/m.           DVT prophylaxis:  enoxaparin (LOVENOX) injection 40 mg Start: 02/17/23 1400  Code Status: Full code Family Communication: Wife over the phone Level of care: Telemetry Medical Status is: Inpatient Remains inpatient appropriate because: Alcohol withdrawal   Final disposition: Likely home once medically stable Consultants:  None  65 minutes with more than 50% spent in reviewing records, counseling patient/family and coordinating care.   Sch Meds:  Scheduled Meds:  aspirin EC  81 mg Oral Daily   chlordiazePOXIDE  25 mg Oral BID   Followed by   Melene Muller ON 02/19/2023] chlordiazePOXIDE  25 mg Oral Q1500   enoxaparin (LOVENOX) injection  40 mg Subcutaneous Q24H   folic acid  1 mg Oral Daily   multivitamin with minerals  1 tablet Oral Daily   pantoprazole  40 mg Oral Daily   sterile water (preservative free)       thiamine  100 mg Oral Daily   Or   thiamine  100 mg Intravenous Daily   Continuous Infusions:  sodium chloride 100 mL/hr at 02/18/23 0326   PRN Meds:.acetaminophen **OR** acetaminophen, guaiFENesin, hydrALAZINE, ipratropium-albuterol, LORazepam **OR** LORazepam, metoprolol tartrate, ondansetron **OR** ondansetron (ZOFRAN) IV, senna-docusate, sterile water (preservative free)  Antimicrobials: Anti-infectives (From  admission, onward)    None        I have personally reviewed the following labs and images: CBC: Recent Labs  Lab 02/15/23 0950 02/17/23 1522 02/17/23 2340  WBC 3.4* 3.7* 3.6*  NEUTROABS 2.4  --   --   HGB 15.9 12.7* 12.2*  HCT 46.9 37.8* 36.9*  MCV 89.7 94.3 92.7  PLT 173 125* 113*   BMP &GFR Recent Labs  Lab 02/15/23 0950 02/17/23 0613 02/17/23 1522 02/17/23 2340  NA 137 136  --  134*  K 4.1 3.7  --  3.4*  CL 93* 93*  --  95*  CO2 22 30  --  27  GLUCOSE 175* 92  --  75  BUN 9 11  --  11  CREATININE 0.98 1.27* 1.14 1.16  CALCIUM 9.8 10.0  --  9.0  MG 1.7  --   --  1.5*  PHOS  --   --   --  4.2   Estimated Creatinine Clearance: 78.7 mL/min (by C-G formula based on SCr of 1.16 mg/dL). Liver & Pancreas: Recent Labs  Lab 02/15/23 0950 02/17/23 0613 02/17/23 2340  AST 237* 226* 246*  ALT 132* 121* 129*  ALKPHOS 69 55 46  BILITOT 1.0 2.3* 2.0*  PROT 7.6 7.1 4.7*  ALBUMIN 4.1 4.1 3.3*   No results for input(s): "LIPASE", "AMYLASE" in the last 168 hours. No results for input(s): "AMMONIA" in the last 168 hours. Diabetic: No results for input(s): "HGBA1C" in the last 72 hours. No results for input(s): "GLUCAP" in the last 168 hours. Cardiac Enzymes: No results for input(s): "CKTOTAL", "CKMB", "CKMBINDEX", "TROPONINI" in the last 168 hours. No results for input(s): "PROBNP" in the last 8760 hours. Coagulation Profile: No results for input(s): "INR", "PROTIME" in the last 168 hours. Thyroid Function Tests: Recent Labs    02/17/23 1522  TSH 6.941*   Lipid Profile: Recent Labs    02/17/23 0613  CHOL 263*  HDL 110  LDLCALC 138*  TRIG 73  CHOLHDL 2.4   Anemia Panel: Recent Labs    02/17/23 1522 02/17/23 2341  VITAMINB12 652  --   FOLATE  --  27.5   Urine analysis:    Component Value Date/Time   COLORURINE AMBER (A) 02/15/2023 0950   APPEARANCEUR HAZY (A) 02/15/2023 0950   LABSPEC 1.015 02/15/2023 0950   PHURINE 5.0 02/15/2023 0950    GLUCOSEU NEGATIVE 02/15/2023 0950   HGBUR SMALL (A) 02/15/2023 0950   BILIRUBINUR NEGATIVE 02/15/2023 0950   KETONESUR 20 (A) 02/15/2023 0950   PROTEINUR 100 (A) 02/15/2023 0950   NITRITE NEGATIVE 02/15/2023 0950   LEUKOCYTESUR NEGATIVE 02/15/2023 0950   Sepsis Labs: Invalid input(s): "PROCALCITONIN", "LACTICIDVEN"  Microbiology: No results found for this or any previous visit (from the past 240 hour(s)).  Radiology Studies: No results found.     T.  [facility]  If 7PM-7AM, please contact night-coverage [email or web address] 02/18/2023, 12:21 PM

## 2023-02-18 NOTE — Progress Notes (Signed)
[facility] night cross cover note:   I was notified by RN that this patient, who was admitted for alcohol withdrawal, is continuing to receive as needed Ativan per CIWA protocol.  He is noted to continue to be confused, with the presence of visual hallucinations noted.  He is refusing to wear telemetry at this time, again continually tries to get out of bed, noting the presence of a telemetry sitter at bedside until 3 AM this morning. Will try to a dose of Geodon 20 mg IM x 1 at this time and will monitor for response to this medication. It is noted that [facility] also has the ability to admit patients to the SDU/ICU as the attending for precedex drip in the setting of alcohol withdrawal and that PCCM involvement is not mandatory in that setting until patient has been on the precedex drip for 24 hours. Will continue to monitor patient's clinical status, response to the above interventions to determine if such a transfer is warranted.    Newton Pigg, DO Hospitalist

## 2023-02-19 DIAGNOSIS — F10251 Alcohol dependence with alcohol-induced psychotic disorder with hallucinations: Secondary | ICD-10-CM | POA: Diagnosis not present

## 2023-02-19 LAB — T4, FREE: Free T4: 0.96 ng/dL (ref 0.61–1.12)

## 2023-02-19 NOTE — Progress Notes (Signed)
Messaged MD about discontining telemetry orders. MD stated as long as patient was not bradycardiac or tachycardiac it was okay. Patient HR was 72 at the moment. He is usually bradycardiac when asleep. MD said it was okay to take him off. Orders discontinued.

## 2023-02-19 NOTE — TOC Progression Note (Signed)
Transition of Care [facility]) - Progression Note    Patient Details  Name: Austin Houston MRN: [id number] Date of Birth: 09-03-1960  Transition of Care [facility]) CM/SW Contact  Janae Bridgeman, RN Phone Number: 02/19/2023, 4:03 PM  Clinical Narrative:    Shanda Bumps, CM at [facility] states that they may likely offer an admission bed to the patient when he is medically stable and able to discharge from the hospital.  I requested updated clinicals from the attending today since the patient is alert and oriented and no longer exhibiting behaviors.  Updated clinicals faxed to [facility] - [phone].     Barriers to Discharge: Continued Medical Work up  Expected Discharge Plan and Services   Discharge Planning Services: CM Consult Post Acute Care Choice: Resumption of Svcs/PTA Provider (Patient given Resources for iNpatient Substance abuse counseling) Living arrangements for the past 2 months: Single Family Home                                       Social Determinants of Health (SDOH) Interventions SDOH Screenings   Food Insecurity: No Food Insecurity (02/17/2023)  Housing: Patient Declined (02/17/2023)  Transportation Needs: No Transportation Needs (02/17/2023)  Utilities: Not At Risk (02/17/2023)  Depression (PHQ2-9): Low Risk  (02/16/2023)  Recent Concern: Depression (PHQ2-9) - Medium Risk (02/15/2023)  Tobacco Use: Medium Risk (02/15/2023)    Readmission Risk Interventions    02/19/2023    1:33 PM  Readmission Risk Prevention Plan  Transportation Screening Complete  PCP or Specialist Appt within 3-5 Days Complete  HRI or Home Care Consult Complete  Social Work Consult for Recovery Care Planning/Counseling Complete  Palliative Care Screening Complete  Medication Review Oceanographer) Complete

## 2023-02-19 NOTE — Progress Notes (Addendum)
[facility] Progress Note  Patient: Austin Houston    NWG:[id number]  DOA: 02/17/2023     Date of Service: the patient was seen and examined on 02/19/2023  No chief complaint on file.  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  Reportedly drinks about 1.75 L of vodka and a few beers every 3 to 4 days for over 30 years.  In ED, slightly hypertensive.  EtOH level 155.  CMP with mildly elevated AST and ALT consistent with alcohol.  WBC 3.4.  Started on Librium taper and CIWA with as needed Ativan, and admitted.    Assessment and Plan:  Acute alcohol withdrawal with psychosis: EtOH level 155.  Increased confusion and agitation overnight.  Required Geodon injection and Recruitment consultant.  Was sleeping this morning.  -Continue CIWA with as needed Ativan -Continue Librium taper -Continue multivitamin, thiamine, folic acid -Continue IV fluid -TOC consult Pt  is alert and oriented and not exhibiting any aggressive behaviors.    Transaminitis: Likely due to EtOH. -Continue monitoring   History of postop A-fib:  not on AC.  Now with sinus bradycardia -Discontinue scheduled metoprolol   Essential hypertension -IV metoprolol as needed   Hypokalemia/hypomagnesemia -Monitor and replenish as appropriate   Pancytopenia: Due to alcohol. -Continue monitoring   History of asthma -Continue home inhalers   History of positive cancer -Outpatient follow-up   PTSD: Does not seem to be on meds.  H/o follicular thyroid cancer status post partial thyroidectomy.  TSH 6.9 elevated, free T40.96 Within normal  range.  Recommend to follow-up with PCP as an outpatient.  Ascending thoracic aortic aneurysm s/p replacement surgery done by CT surgery in 11/30/2022, no active issues.  Follows an outpatient.   Body mass index is 26.32 kg/m.  Interventions:  Diet: Heart healthy diet DVT Prophylaxis: Subcutaneous Lovenox   Advance goals of care discussion: Full code  Family Communication: family was present at bedside, at the time of interview.  The pt provided permission to discuss medical plan with the family. Opportunity was given to ask question and all questions were answered satisfactorily.   Disposition:  Pt is from behavioral health, admitted with EtOH abuse with withdrawal symptoms, transaminitis, still has elevated LFTs, on CIWA protocol, which precludes a safe discharge. Discharge to behavioral health versus home, when stable.  Subjective: No significant events overnight, patient is little bit anxious, denies tremors, no significant withdrawal symptoms.  Denies any chest pain or palpitations, no shortness of breath.  No Active issues.  Physical Exam: General: NAD, lying comfortably Appear in no distress, affect appropriate Eyes: PERRLA ENT: Oral Mucosa Clear, moist  Neck: no JVD,  Cardiovascular: S1 and S2 Present, no Murmur,  Respiratory: good respiratory effort, Bilateral Air entry equal and Decreased, no Crackles, no wheezes Abdomen: Bowel Sound present, Soft and no tenderness,  Skin: no rashes Extremities: no Pedal edema, no calf tenderness Neurologic: without any new focal findings Gait not checked due to patient safety concerns  Vitals:   02/18/23 1953 02/19/23 0020 02/19/23 0814 02/19/23 1137  BP: (!) 140/95 (!) 158/96 (!) 175/93 (!) 158/100  Pulse: 86 73 (!) 59 71  Resp: 20 18 16    Temp: 98 F (  36.7 C) 97.6 F (36.4 C) 97.6 F (36.4 C) 97.6 F (36.4 C)  TempSrc: Oral Oral  Oral  SpO2: 95% 94% 98% 99%  Weight:      Height:        Intake/Output Summary (Last 24  hours) at 02/19/2023 1429 Last data filed at 02/19/2023 7902 Gross per 24 hour  Intake 2220.14 ml  Output 600 ml  Net 1620.14 ml   Filed Weights   02/17/23 1234  Weight: 93 kg    Data Reviewed: I have personally reviewed and interpreted daily labs, tele strips, imagings as discussed above. I reviewed all nursing notes, pharmacy notes, vitals, pertinent old records I have discussed plan of care as described above with RN and patient/family.  CBC: Recent Labs  Lab 02/15/23 0950 02/17/23 1522 02/17/23 2340 02/19/23 0259  WBC 3.4* 3.7* 3.6* 3.4*  NEUTROABS 2.4  --   --   --   HGB 15.9 12.7* 12.2* 12.5*  HCT 46.9 37.8* 36.9* 37.9*  MCV 89.7 94.3 92.7 93.1  PLT 173 125* 113* 132*   Basic Metabolic Panel: Recent Labs  Lab 02/15/23 0950 02/17/23 0613 02/17/23 1522 02/17/23 2340 02/19/23 0259  NA 137 136  --  134* 137  K 4.1 3.7  --  3.4* 3.7  CL 93* 93*  --  95* 101  CO2 22 30  --  27 26  GLUCOSE 175* 92  --  75 89  BUN 9 11  --  11 11  CREATININE 0.98 1.27* 1.14 1.16 1.07  CALCIUM 9.8 10.0  --  9.0 8.8*  MG 1.7  --   --  1.5* 1.9  PHOS  --   --   --  4.2 3.8    Studies: No results found.  Scheduled Meds:  aspirin EC  81 mg Oral Daily   chlordiazePOXIDE  25 mg Oral Q1500   enoxaparin (LOVENOX) injection  40 mg Subcutaneous Q24H   folic acid  1 mg Oral Daily   multivitamin with minerals  1 tablet Oral Daily   pantoprazole  40 mg Oral Daily   thiamine  100 mg Oral Daily   Or   thiamine  100 mg Intravenous Daily   Continuous Infusions:  dextrose 5 % and 0.9 % NaCl 125 mL/hr at 02/19/23 0627   PRN Meds: acetaminophen **OR** acetaminophen, guaiFENesin, hydrALAZINE, ipratropium-albuterol, LORazepam **OR** LORazepam, metoprolol tartrate, ondansetron **OR** ondansetron (ZOFRAN) IV, senna-docusate  Time spent: 35 minutes  Author: Gillis Santa. MD [facility] 02/19/2023 2:29 PM  To reach On-call, see care teams to locate the attending and reach out to them  via [email or web address]. If 7PM-7AM, please contact night-coverage If you still have difficulty reaching the attending provider, please page the [facility] (Director on Call) for [facility] on amion for assistance.

## 2023-02-19 NOTE — TOC Initial Note (Addendum)
Transition of Care [facility]) - Initial/Assessment Note    Patient Details  Name: Austin Houston MRN: [id number] Date of Birth: [birth year]  Transition [facility] Care [facility]) CM/SW Contact:    Janae Bridgeman, RN Phone Number: 02/19/2023, 1:36 PM  Clinical Narrative:                 CM met with the patient at the bedside to provide the patient with Op Resources for Substance abuse counseling.  The patient states that he was "leaning towards OP counseling through the EAP provider at [facility]" but he states that his wife would like him to explore options for Inpatient Substance abuse counseling.    I provided with the patient with Inpatient Substance abuse counseling resources and the patient plans to call [facility] to discuss.  The patient has a PCP provider - Dr. Patsy Lager - noted in the AVS for patient to call the office.  [facility] - to fax # [phone] - including insurance card copy, facesheet, progress notes, H&P and medication list.  Patient and wife are aware and have requested.     Barriers to Discharge: Continued Medical Work up   Patient Goals and CMS Choice Patient states their goals for this hospitalization and ongoing recovery are:: To get better and return home CMS Medicare.gov Compare Post Acute Care list provided to:: Patient Choice offered to / list presented to : Patient [place] ownership interest in [facility].provided to:: Patient    Expected Discharge Plan and Services   Discharge Planning Services: CM Consult Post Acute Care Choice: Resumption of Svcs/PTA Provider (Patient given Resources for iNpatient Substance abuse counseling) Living arrangements for the past 2 months: Single Family Home                                      Prior Living Arrangements/Services Living arrangements for the past 2 months: Single Family Home Lives with:: Spouse Patient language and need for  interpreter reviewed:: Yes Do you feel safe going back to the place where you live?: Yes      Need for Family Participation in Patient Care: Yes (Comment) Care giver support system in place?: Yes (comment)   Criminal Activity/Legal Involvement Pertinent to Current Situation/Hospitalization: No - Comment as needed  Activities of Daily Living Home Assistive Devices/Equipment: None ADL Screening (condition at time of admission) Patient's cognitive ability adequate to safely complete daily activities?: Yes Is the patient deaf or have difficulty hearing?: Yes Does the patient have difficulty seeing, even when wearing glasses/contacts?: No Does the patient have difficulty concentrating, remembering, or making decisions?: Yes Patient able to express need for assistance with ADLs?: Yes Does the patient have difficulty dressing or bathing?: No Independently performs ADLs?: Yes (appropriate for developmental age) Does the patient have difficulty walking or climbing stairs?: No Weakness of Legs: Both Weakness of Arms/Hands: Both  Permission Sought/Granted Permission sought to share information with : Case Manager, Family Supports, Oceanographer granted to share information with : Yes, Verbal Permission Granted     Permission granted to share info w AGENCY: Inpatient and OUtpatient Substance abuse counseling resources provided  Permission granted to share info w Relationship: wife = at bedside     Emotional Assessment Appearance:: Appears stated age Attitude/Demeanor/Rapport: Gracious Affect (typically observed): Accepting Orientation: : Oriented to Self, Oriented to Place, Oriented to  Time,  Oriented to Situation Alcohol / Substance Use: Not Applicable Psych Involvement: No (comment)  Admission diagnosis:  Chronic alcoholism (HCC) [F10.20] Alcohol withdrawal delirium, acute, mixed level of activity (HCC) [F10.231] Alcoholism with psychosis (HCC)  [F10.259] Alcohol use disorder [F10.90] Patient Active Problem List   Diagnosis Date Noted   Alcoholism with psychosis (HCC) 02/17/2023   Alcohol use disorder, severe 02/16/2023   Alcohol use disorder, severe, in controlled environment, dependence (HCC) 02/15/2023   Alcohol withdrawal (HCC) 02/15/2023   Alcohol abuse 02/15/2023   S/P ascending aortic aneurysm repair 09/07/2022   Aneurysm of ascending aorta without rupture (HCC) 07/20/2022   Mild persistent asthma without complication 01/24/2020   Prostate cancer (HCC) 01/08/2016   Family history of prostate cancer in father 06/19/2014   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 Pharmacy:   [facility] [address] [place] [address] Phone: [phone] Fax: [phone]  [facility] [id number] [place], [place] - [address] [place] [address] Phone: [phone] Fax: [phone]     Social Determinants of Health (SDOH) Social History: SDOH Screenings   Food Insecurity: No Food Insecurity (02/17/2023)  Housing: Patient Declined (02/17/2023)  Transportation Needs: No Transportation Needs (02/17/2023)  Utilities: Not At Risk (02/17/2023)  Depression (PHQ2-9): Low Risk  (02/16/2023)  Recent Concern: Depression (PHQ2-9) - Medium Risk (02/15/2023)  Tobacco Use: Medium Risk (02/15/2023)   SDOH Interventions:     Readmission Risk Interventions    02/19/2023    1:33 PM  Readmission Risk Prevention Plan  Transportation Screening Complete  PCP or Specialist Appt within 3-5 Days Complete  HRI or Home Care Consult Complete  Social Work Consult for  Recovery Care Planning/Counseling Complete  Palliative Care Screening Complete  Medication Review Oceanographer) Complete

## 2023-02-19 NOTE — Plan of Care (Signed)
  Problem: Education: Goal: Knowledge of disease or condition will improve Outcome: Progressing Goal: Understanding of discharge needs will improve Outcome: Progressing   Problem: Health Behavior/Discharge Planning: Goal: Ability to identify changes in lifestyle to reduce recurrence of condition will improve Outcome: Progressing Goal: Identification of resources available to assist in meeting health care needs will improve Outcome: Progressing   Problem: Physical Regulation: Goal: Complications related to the disease process, condition or treatment will be avoided or minimized Outcome: Progressing   Problem: Safety: Goal: Ability to remain free from injury will improve Outcome: Progressing   Problem: Education: Goal: Knowledge of General Education information will improve Description: Including pain rating scale, medication(s)/side effects and non-pharmacologic comfort measures Outcome: Progressing   Problem: Health Behavior/Discharge Planning: Goal: Ability to manage health-related needs will improve Outcome: Progressing   Problem: Clinical Measurements: Goal: Ability to maintain clinical measurements within normal limits will improve Outcome: Progressing Goal: Will remain free from infection Outcome: Progressing Goal: Diagnostic test results will improve Outcome: Progressing Goal: Respiratory complications will improve Outcome: Progressing Goal: Cardiovascular complication will be avoided Outcome: Progressing   Problem: Pain Managment: Goal: General experience of comfort will improve Outcome: Progressing   Problem: Safety: Goal: Ability to remain free from injury will improve Outcome: Progressing   Problem: Skin Integrity: Goal: Risk for impaired skin integrity will decrease Outcome: Progressing

## 2023-02-19 NOTE — Progress Notes (Signed)
Can we please get this IV ASAP. Pt BP is high and need to give IV medicine. Thank you soo much.  Pt lost current IV.   Above message sent to IV team.

## 2023-02-20 DIAGNOSIS — F10251 Alcohol dependence with alcohol-induced psychotic disorder with hallucinations: Secondary | ICD-10-CM | POA: Diagnosis not present

## 2023-02-20 MED ORDER — CHLORDIAZEPOXIDE HCL 5 MG PO CAPS
10.0000 mg | ORAL_CAPSULE | Freq: Three times a day (TID) | ORAL | Status: AC
  Administered 2023-02-20 – 2023-02-21 (×6): 10 mg via ORAL
  Filled 2023-02-20 (×6): qty 2

## 2023-02-20 MED ORDER — FLUTICASONE FUROATE-VILANTEROL 200-25 MCG/ACT IN AEPB
1.0000 | INHALATION_SPRAY | Freq: Every day | RESPIRATORY_TRACT | Status: DC
  Filled 2023-02-20 (×2): qty 28

## 2023-02-20 MED ORDER — LORATADINE 10 MG PO TABS
10.0000 mg | ORAL_TABLET | Freq: Every day | ORAL | Status: DC
  Administered 2023-02-20 – 2023-02-22 (×3): 10 mg via ORAL
  Filled 2023-02-20 (×3): qty 1

## 2023-02-20 MED ORDER — MONTELUKAST SODIUM 10 MG PO TABS
10.0000 mg | ORAL_TABLET | Freq: Every day | ORAL | Status: DC
  Administered 2023-02-20 – 2023-02-22 (×3): 10 mg via ORAL
  Filled 2023-02-20 (×3): qty 1

## 2023-02-20 MED ORDER — METOPROLOL TARTRATE 25 MG PO TABS
25.0000 mg | ORAL_TABLET | Freq: Two times a day (BID) | ORAL | Status: DC
  Administered 2023-02-20 – 2023-02-22 (×5): 25 mg via ORAL
  Filled 2023-02-20 (×5): qty 1

## 2023-02-20 MED ORDER — CHLORDIAZEPOXIDE HCL 5 MG PO CAPS
10.0000 mg | ORAL_CAPSULE | Freq: Two times a day (BID) | ORAL | Status: AC
  Administered 2023-02-22 (×2): 10 mg via ORAL
  Filled 2023-02-20 (×2): qty 2

## 2023-02-20 MED ORDER — ALBUTEROL SULFATE (2.5 MG/3ML) 0.083% IN NEBU: 2.5000 mg | INHALATION_SOLUTION | Freq: Four times a day (QID) | RESPIRATORY_TRACT | Status: DC | PRN

## 2023-02-20 MED ORDER — ESCITALOPRAM OXALATE 10 MG PO TABS
10.0000 mg | ORAL_TABLET | Freq: Every morning | ORAL | Status: DC
  Administered 2023-02-20 – 2023-02-22 (×3): 10 mg via ORAL
  Filled 2023-02-20 (×3): qty 1

## 2023-02-20 MED ORDER — CHLORDIAZEPOXIDE HCL 5 MG PO CAPS: 10.0000 mg | ORAL_CAPSULE | Freq: Every day | ORAL | Status: DC

## 2023-02-20 MED ORDER — HYDRALAZINE HCL 25 MG PO TABS
25.0000 mg | ORAL_TABLET | Freq: Four times a day (QID) | ORAL | Status: DC | PRN
  Administered 2023-02-22: 25 mg via ORAL
  Filled 2023-02-20: qty 1

## 2023-02-20 NOTE — Progress Notes (Signed)
PROGRESS NOTE  Austin Houston ZOX:[id number] DOB: 14-Apr-1961   PCP: No primary care provider on file.  Patient is from: [facility]  DOA: 02/17/2023 LOS: 3  Chief complaints No chief complaint on file.    Brief Narrative / Interim history: 62 year old M with PMH of postop A-fib not on AC, alcohol abuse, HTN presenting from [facility] with confusion, increased hallucination and delirium and admitted for alcohol withdrawal management.  Reportedly drinks about 1.75 L of vodka and a few beers every 3 to 4 days for over 30 years.  In ED, slightly hypertensive.  EtOH level 155.  CMP with mildly elevated AST and ALT consistent with alcohol.  WBC 3.4.  Started on Librium taper and CIWA with as needed Ativan, and admitted.    Patient was agitated and confused the night of admission requiring Geodon injection and one-to-one Recruitment consultant.  Patient has been awake, alert and oriented not exhibiting any aggressive behaviors since 8/9.  Waiting on admission at [facility] for alcohol use disorder.  Subjective: Seen and examined earlier this morning.  No major events overnight of this morning.  Feels "anxious and depressed".  Reports prior history of anxiety for which she was tried on Xanax.  No formal diagnosis of depression.  Had significant traumatic experience from [employer] life.  Seems like he has some flashbacks but never been treated for PTSD since he was "pretty functional".  He also reports multiple stressors including loss of father, father-in-law and grandchild with heart condition.  Currently has no other visual hallucination.  He is on board to go to [facility] and get help with alcohol use disorder.  Also some sense of guilt.  We have discussed about trial of pharmacologic therapy for underlying anxiety and possible depression.  I have discussed SSRI that he has not tried in the past.  He likes to try this.  He says he has upcoming annual physical with PCP in 1 month.  He has no history of bipolar  disorder or mania.   Objective: Vitals:   02/19/23 2132 02/19/23 2150 02/20/23 0026 02/20/23 0721  BP: (!) 184/87 (!) 183/94 (!) 159/92 (!) 157/94  Pulse: (!) 58 (!) 57 67 77  Resp: 18  18 17   Temp: 97.8 F (36.6 C)  98.4 F (36.9 C) 98 F (36.7 C)  TempSrc: Oral  Oral Oral  SpO2: 100%  98% 96%  Weight:      Height:        Examination:  GENERAL: No apparent distress.  Nontoxic. HEENT: MMM.  Vision and hearing grossly intact.  NECK: Supple.  No apparent JVD.  RESP:  No IWOB.  Fair aeration bilaterally. CVS:  RRR. Heart sounds normal.  ABD/GI/GU: BS+. Abd soft, NTND.  MSK/EXT:   No apparent deformity. Moves extremities. No edema.  SKIN: no apparent skin lesion or wound NEURO: Awake and alert. Oriented appropriately.  No apparent focal neuro deficit. PSYCH: Somewhat tearful talking about his situation.  Procedures:  None  Microbiology summarized: None  Assessment and plan: Principal Problem:   Alcoholism with psychosis (HCC)  Acute alcohol withdrawal with psychosis: EtOH level 155.  Increased confusion and agitation the night of admission requiring Geodon injection and Recruitment consultant.  He is now awake and alert and oriented x 4.  No agitation or confusion.  A little anxious.  -Continue CIWA with as needed Ativan -Continue Librium taper with low-dose Librium -Continue multivitamin, thiamine, folic acid -Plan for transfer to [facility] for alcohol use disorder.  Anxiety/possible  depression: No suicidal ideation.  No psychosis.  No history of bipolar disorder.  Extensive discussion with patient about options of treatment including pharmacologics such as SSRI. -Start Lexapro 10 mg daily.  Patient reports upcoming annual physical in 1 months with PCP  Transaminitis: Likely due to EtOH.  Stable. -Continue monitoring   History of postop A-fib:  not on AC.  Now with sinus bradycardia -Discontinue scheduled metoprolol  Essential hypertension -Continue  metoprolol  Hypokalemia/hypomagnesemia -Monitor and replenish as appropriate  Pancytopenia: Due to alcohol. -Continue monitoring  History of asthma -Resume home inhalers or substitutes.  History of prostate cancer -Outpatient follow-up  PTSD: Does not seem to be on meds. -Started Lexapro as above.     Body mass index is 26.32 kg/m.           DVT prophylaxis:  enoxaparin (LOVENOX) injection 40 mg Start: 02/17/23 1400  Code Status: Full code Family Communication: None at bedside today. Level of care: Telemetry Medical Status is: Inpatient Remains inpatient appropriate because: Alcohol withdrawal   Final disposition: [facility] Consultants:  None  55 minutes with more than 50% spent in reviewing records, counseling patient/family and coordinating care.   Sch Meds:  Scheduled Meds:  aspirin EC  81 mg Oral Daily   chlordiazePOXIDE  10 mg Oral TID   Followed by   Melene Muller ON 02/22/2023] chlordiazePOXIDE  10 mg Oral BID   Followed by   Melene Muller ON 02/24/2023] chlordiazePOXIDE  10 mg Oral Daily   enoxaparin (LOVENOX) injection  40 mg Subcutaneous Q24H   escitalopram  10 mg Oral q AM   fluticasone furoate-vilanterol  1 puff Inhalation Daily   folic acid  1 mg Oral Daily   loratadine  10 mg Oral Daily   metoprolol tartrate  25 mg Oral BID   montelukast  10 mg Oral QHS   multivitamin with minerals  1 tablet Oral Daily   pantoprazole  40 mg Oral Daily   thiamine  100 mg Oral Daily   Or   thiamine  100 mg Intravenous Daily   Continuous Infusions:  dextrose 5 % and 0.9 % NaCl 125 mL/hr at 02/19/23 1710   PRN Meds:.acetaminophen **OR** acetaminophen, albuterol, guaiFENesin, hydrALAZINE, LORazepam **OR** LORazepam, metoprolol tartrate, ondansetron **OR** ondansetron (ZOFRAN) IV, senna-docusate  Antimicrobials: Anti-infectives (From admission, onward)    None        I have personally reviewed the following labs and images: CBC: Recent Labs  Lab  02/15/23 0950 02/17/23 1522 02/17/23 2340 02/19/23 0259 02/20/23 0758  WBC 3.4* 3.7* 3.6* 3.4* 2.9*  NEUTROABS 2.4  --   --   --   --   HGB 15.9 12.7* 12.2* 12.5* 12.3*  HCT 46.9 37.8* 36.9* 37.9* 36.1*  MCV 89.7 94.3 92.7 93.1 93.5  PLT 173 125* 113* 132* 147*   BMP &GFR Recent Labs  Lab 02/15/23 0950 02/17/23 0613 02/17/23 1522 02/17/23 2340 02/19/23 0259 02/20/23 0758  NA 137 136  --  134* 137 137  K 4.1 3.7  --  3.4* 3.7 3.8  CL 93* 93*  --  95* 101 104  CO2 22 30  --  27 26 22   GLUCOSE 175* 92  --  75 89 108*  BUN 9 11  --  11 11 6   CREATININE 0.98 1.27* 1.14 1.16 1.07 0.99  CALCIUM 9.8 10.0  --  9.0 8.8* 8.8*  MG 1.7  --   --  1.5* 1.9 1.8  PHOS  --   --   --  4.2 3.8 3.7   Estimated Creatinine Clearance: 92.3 mL/min (by C-G formula based on SCr of 0.99 mg/dL). Liver & Pancreas: Recent Labs  Lab 02/15/23 0950 02/17/23 0613 02/17/23 2340 02/19/23 0259 02/20/23 0758  AST 237* 226* 246* 242* 184*  ALT 132* 121* 129* 169* 182*  ALKPHOS 69 55 46 46 44  BILITOT 1.0 2.3* 2.0* 1.3* 1.3*  PROT 7.6 7.1 4.7* 5.8* 5.8*  ALBUMIN 4.1 4.1 3.3* 3.1* 3.1*   No results for input(s): "LIPASE", "AMYLASE" in the last 168 hours. No results for input(s): "AMMONIA" in the last 168 hours. Diabetic: No results for input(s): "HGBA1C" in the last 72 hours. No results for input(s): "GLUCAP" in the last 168 hours. Cardiac Enzymes: Recent Labs  Lab 02/19/23 0259  CKTOTAL 169   No results for input(s): "PROBNP" in the last 8760 hours. Coagulation Profile: No results for input(s): "INR", "PROTIME" in the last 168 hours. Thyroid Function Tests: Recent Labs    02/17/23 1522  TSH 6.941*  FREET4 0.96   Lipid Profile: No results for input(s): "CHOL", "HDL", "LDLCALC", "TRIG", "CHOLHDL", "LDLDIRECT" in the last 72 hours.  Anemia Panel: Recent Labs    02/17/23 1522 02/17/23 2341  VITAMINB12 652  --   FOLATE  --  27.5   Urine analysis:    Component Value Date/Time    COLORURINE AMBER (A) 02/15/2023 0950   APPEARANCEUR HAZY (A) 02/15/2023 0950   LABSPEC 1.015 02/15/2023 0950   PHURINE 5.0 02/15/2023 0950   GLUCOSEU NEGATIVE 02/15/2023 0950   HGBUR SMALL (A) 02/15/2023 0950   BILIRUBINUR NEGATIVE 02/15/2023 0950   KETONESUR 20 (A) 02/15/2023 0950   PROTEINUR 100 (A) 02/15/2023 0950   NITRITE NEGATIVE 02/15/2023 0950   LEUKOCYTESUR NEGATIVE 02/15/2023 0950   Sepsis Labs: Invalid input(s): "PROCALCITONIN", "LACTICIDVEN"  Microbiology: No results found for this or any previous visit (from the past 240 hour(s)).  Radiology Studies: No results found.     T.  [facility]  If 7PM-7AM, please contact night-coverage [email or web address] 02/20/2023, 12:44 PM

## 2023-02-21 DIAGNOSIS — F419 Anxiety disorder, unspecified: Secondary | ICD-10-CM | POA: Insufficient documentation

## 2023-02-21 DIAGNOSIS — F10251 Alcohol dependence with alcohol-induced psychotic disorder with hallucinations: Secondary | ICD-10-CM | POA: Diagnosis not present

## 2023-02-21 DIAGNOSIS — R748 Abnormal levels of other serum enzymes: Secondary | ICD-10-CM | POA: Insufficient documentation

## 2023-02-21 DIAGNOSIS — D61818 Other pancytopenia: Secondary | ICD-10-CM | POA: Insufficient documentation

## 2023-02-21 LAB — RETICULOCYTES
Immature Retic Fract: 10 % (ref 2.3–15.9)
RBC.: 3.76 MIL/uL — ABNORMAL LOW (ref 4.22–5.81)
Retic Count, Absolute: 89.1 10*3/uL (ref 19.0–186.0)
Retic Ct Pct: 2.4 % (ref 0.4–3.1)

## 2023-02-21 LAB — COMPREHENSIVE METABOLIC PANEL WITH GFR
ALT: 183 U/L — ABNORMAL HIGH (ref 0–44)
AST: 162 U/L — ABNORMAL HIGH (ref 15–41)
Albumin: 3 g/dL — ABNORMAL LOW (ref 3.5–5.0)
Alkaline Phosphatase: 49 U/L (ref 38–126)
Anion gap: 9 (ref 5–15)
BUN: 6 mg/dL (ref 6–20)
CO2: 23 mmol/L (ref 22–32)
Calcium: 8.6 mg/dL — ABNORMAL LOW (ref 8.9–10.3)
Chloride: 102 mmol/L (ref 98–111)
Creatinine, Ser: 0.94 mg/dL (ref 0.61–1.24)
GFR, Estimated: >60 mL/min (ref >60)
Glucose, Bld: 107 mg/dL — ABNORMAL HIGH (ref 70–99)
Potassium: 3.8 mmol/L (ref 3.5–5.1)
Sodium: 134 mmol/L — ABNORMAL LOW (ref 135–145)
Total Bilirubin: 1 mg/dL (ref 0.3–1.2)
Total Protein: 5.8 g/dL — ABNORMAL LOW (ref 6.5–8.1)

## 2023-02-21 LAB — IRON AND TIBC
Iron: 68 ug/dL (ref 45–182)
Saturation Ratios: 23 % (ref 17.9–39.5)
TIBC: 298 ug/dL (ref 250–450)
UIBC: 230 ug/dL

## 2023-02-21 LAB — CBC
HCT: 34.8 % — ABNORMAL LOW (ref 39.0–52.0)
Hemoglobin: 11.7 g/dL — ABNORMAL LOW (ref 13.0–17.0)
MCH: 31.1 pg (ref 26.0–34.0)
MCHC: 33.6 g/dL (ref 30.0–36.0)
MCV: 92.6 fL (ref 80.0–100.0)
Platelets: 174 10*3/uL (ref 150–400)
RBC: 3.76 MIL/uL — ABNORMAL LOW (ref 4.22–5.81)
RDW: 13.9 % (ref 11.5–15.5)
WBC: 2.9 10*3/uL — ABNORMAL LOW (ref 4.0–10.5)
nRBC: 0 % (ref 0.0–0.2)

## 2023-02-21 LAB — FOLATE: Folate: 29.7 ng/mL (ref >5.9)

## 2023-02-21 LAB — VITAMIN B12: Vitamin B-12: 1039 pg/mL — ABNORMAL HIGH (ref 180–914)

## 2023-02-21 LAB — FERRITIN: Ferritin: 233 ng/mL (ref 24–336)

## 2023-02-21 LAB — PHOSPHORUS: Phosphorus: 3.7 mg/dL (ref 2.5–4.6)

## 2023-02-21 LAB — CK: Total CK: 63 U/L (ref 49–397)

## 2023-02-21 LAB — MAGNESIUM: Magnesium: 1.7 mg/dL (ref 1.7–2.4)

## 2023-02-21 NOTE — Progress Notes (Signed)
PROGRESS NOTE  Austin Houston:[id number] DOB: [birth year]   PCP: No primary care provider on file.  Patient is from: [facility]  DOA: 02/17/2023 LOS: 4  Chief complaints No chief complaint on file.    Brief Narrative / Interim history: 62 year old M with PMH of postop A-fib not on AC, alcohol abuse, HTN presenting from [facility] with confusion, increased hallucination and delirium and admitted for alcohol withdrawal management.  Reportedly drinks about 1.75 L of vodka and a few beers every 3 to 4 days for over 30 years.  In ED, slightly hypertensive.  EtOH level 155.  CMP with mildly elevated AST and ALT consistent with alcohol.  WBC 3.4.  Started on Librium taper and CIWA with as needed Ativan, and admitted.    Patient was agitated and confused the night of admission requiring Geodon injection and one-to-one Recruitment consultant.  Patient has been awake, alert and oriented not exhibiting any aggressive behaviors since 8/9.  Waiting on admission at [facility] for alcohol use disorder.  Subjective: Seen and examined earlier this morning.  No major events overnight of this morning.  Slept well.  No complaints.  Appreciates the care.   Objective: Vitals:   02/20/23 1545 02/20/23 2006 02/21/23 0458 02/21/23 0746  BP: (!) 164/99 (!) 159/96 (!) 152/77 (!) 155/78  Pulse: 61 (!) 58 (!) 57 62  Resp: 17 18 18 17   Temp: 98.2 F (36.8 C) 97.6 F (36.4 C) 98.2 F (36.8 C) 98.4 F (36.9 C)  TempSrc:  Oral Oral Oral  SpO2: 100% 98% 97%   Weight:      Height:        Examination:  GENERAL: No apparent distress.  Nontoxic. HEENT: MMM.  Vision and hearing grossly intact.  NECK: Supple.  No apparent JVD.  RESP:  No IWOB.  Fair aeration bilaterally. CVS:  RRR. Heart sounds normal.  ABD/GI/GU: BS+. Abd soft, NTND.  MSK/EXT:   No apparent deformity. Moves extremities. No edema.  SKIN: no apparent skin lesion or wound NEURO: Awake and alert. Oriented appropriately.  No apparent focal neuro  deficit. PSYCH: Calm. Normal affect.   Procedures:  None  Microbiology summarized: None  Assessment and plan: Principal Problem:   Alcoholism with psychosis (HCC)  Acute alcohol withdrawal with psychosis: EtOH level 155.  Increased confusion and agitation the night of admission requiring Geodon injection and Recruitment consultant.  He is now awake and alert and oriented x 4.  No agitation or confusion.  A little anxious.  -Continue CIWA with as needed Ativan-has not needed. -Continue Librium taper with low-dose Librium -Continue multivitamin, thiamine, folic acid -Plan for transfer to [facility] for alcohol use disorder.  Anxiety/possible depression/history of PTSD: No suicidal ideation.  No psychosis.  No history of bipolar disorder.  Extensive discussion with patient about options of treatment including pharmacologics such as SSRI. -Started Lexapro 10 mg daily on 8/10.  Patient reports upcoming annual physical in 1 months with PCP  Transaminitis: Likely due to EtOH.  Stable. -Continue monitoring   History of postop A-fib:  not on AC.  Now with sinus bradycardia -Discontinue scheduled metoprolol  Essential hypertension -Continue metoprolol  Hypokalemia/hypomagnesemia -Monitor and replenish as appropriate  Pancytopenia: Due to alcohol.  Anemia panel within normal.  Improving. -Continue monitoring  History of asthma -Resume home inhalers or substitutes.  History of prostate cancer -Outpatient follow-up     Body mass index is 26.32 kg/m.           DVT prophylaxis:  enoxaparin (LOVENOX) injection 40 mg Start: 02/17/23 1400  Code Status: Full code Family Communication: Updated patient's wife over the phone. Level of care: Telemetry Medical Status is: Inpatient Remains inpatient appropriate because: Alcohol withdrawal   Final disposition: [facility] Consultants:  None  35 minutes with more than 50% spent in reviewing records, counseling patient/family  and coordinating care.   Sch Meds:  Scheduled Meds:  aspirin EC  81 mg Oral Daily   chlordiazePOXIDE  10 mg Oral TID   Followed by   Melene Muller ON 02/22/2023] chlordiazePOXIDE  10 mg Oral BID   Followed by   Melene Muller ON 02/24/2023] chlordiazePOXIDE  10 mg Oral Daily   enoxaparin (LOVENOX) injection  40 mg Subcutaneous Q24H   escitalopram  10 mg Oral q AM   fluticasone furoate-vilanterol  1 puff Inhalation Daily   folic acid  1 mg Oral Daily   loratadine  10 mg Oral Daily   metoprolol tartrate  25 mg Oral BID   montelukast  10 mg Oral QHS   multivitamin with minerals  1 tablet Oral Daily   pantoprazole  40 mg Oral Daily   thiamine  100 mg Oral Daily   Or   thiamine  100 mg Intravenous Daily   Continuous Infusions:  dextrose 5 % and 0.9 % NaCl 125 mL/hr at 02/19/23 1710   PRN Meds:.acetaminophen **OR** acetaminophen, albuterol, guaiFENesin, hydrALAZINE, metoprolol tartrate, ondansetron **OR** ondansetron (ZOFRAN) IV, senna-docusate  Antimicrobials: Anti-infectives (From admission, onward)    None        I have personally reviewed the following labs and images: CBC: Recent Labs  Lab 02/15/23 0950 02/17/23 1522 02/17/23 2340 02/19/23 0259 02/20/23 0758 02/21/23 0256  WBC 3.4* 3.7* 3.6* 3.4* 2.9* 2.9*  NEUTROABS 2.4  --   --   --   --   --   HGB 15.9 12.7* 12.2* 12.5* 12.3* 11.7*  HCT 46.9 37.8* 36.9* 37.9* 36.1* 34.8*  MCV 89.7 94.3 92.7 93.1 93.5 92.6  PLT 173 125* 113* 132* 147* 174   BMP &GFR Recent Labs  Lab 02/15/23 0950 02/17/23 0613 02/17/23 1522 02/17/23 2340 02/19/23 0259 02/20/23 0758 02/21/23 0256  NA 137 136  --  134* 137 137 134*  K 4.1 3.7  --  3.4* 3.7 3.8 3.8  CL 93* 93*  --  95* 101 104 102  CO2 22 30  --  27 26 22 23   GLUCOSE 175* 92  --  75 89 108* 107*  BUN 9 11  --  11 11 6 6   CREATININE 0.98 1.27* 1.14 1.16 1.07 0.99 0.94  CALCIUM 9.8 10.0  --  9.0 8.8* 8.8* 8.6*  MG 1.7  --   --  1.5* 1.9 1.8 1.7  PHOS  --   --   --  4.2 3.8 3.7 3.7    Estimated Creatinine Clearance: 97.2 mL/min (by C-G formula based on SCr of 0.94 mg/dL). Liver & Pancreas: Recent Labs  Lab 02/17/23 0613 02/17/23 2340 02/19/23 0259 02/20/23 0758 02/21/23 0256  AST 226* 246* 242* 184* 162*  ALT 121* 129* 169* 182* 183*  ALKPHOS 55 46 46 44 49  BILITOT 2.3* 2.0* 1.3* 1.3* 1.0  PROT 7.1 4.7* 5.8* 5.8* 5.8*  ALBUMIN 4.1 3.3* 3.1* 3.1* 3.0*   No results for input(s): "LIPASE", "AMYLASE" in the last 168 hours. No results for input(s): "AMMONIA" in the last 168 hours. Diabetic: No results for input(s): "HGBA1C" in the last 72 hours. No results for input(s): "GLUCAP" in  the last 168 hours. Cardiac Enzymes: Recent Labs  Lab 02/19/23 0259 02/21/23 0256  CKTOTAL 169 63   No results for input(s): "PROBNP" in the last 8760 hours. Coagulation Profile: No results for input(s): "INR", "PROTIME" in the last 168 hours. Thyroid Function Tests: No results for input(s): "TSH", "T4TOTAL", "FREET4", "T3FREE", "THYROIDAB" in the last 72 hours.  Lipid Profile: No results for input(s): "CHOL", "HDL", "LDLCALC", "TRIG", "CHOLHDL", "LDLDIRECT" in the last 72 hours.  Anemia Panel: Recent Labs    02/21/23 0256  VITAMINB12 1,039*  FOLATE 29.7  FERRITIN 233  TIBC 298  IRON 68  RETICCTPCT 2.4   Urine analysis:    Component Value Date/Time   COLORURINE AMBER (A) 02/15/2023 0950   APPEARANCEUR HAZY (A) 02/15/2023 0950   LABSPEC 1.015 02/15/2023 0950   PHURINE 5.0 02/15/2023 0950   GLUCOSEU NEGATIVE 02/15/2023 0950   HGBUR SMALL (A) 02/15/2023 0950   BILIRUBINUR NEGATIVE 02/15/2023 0950   KETONESUR 20 (A) 02/15/2023 0950   PROTEINUR 100 (A) 02/15/2023 0950   NITRITE NEGATIVE 02/15/2023 0950   LEUKOCYTESUR NEGATIVE 02/15/2023 0950   Sepsis Labs: Invalid input(s): "PROCALCITONIN", "LACTICIDVEN"  Microbiology: No results found for this or any previous visit (from the past 240 hour(s)).  Radiology Studies: No results found.     T.   [facility]  If 7PM-7AM, please contact night-coverage [email or web address] 02/21/2023, 12:20 PM

## 2023-02-22 ENCOUNTER — Encounter (HOSPITAL_COMMUNITY): Payer: Self-pay | Admitting: Internal Medicine

## 2023-02-22 DIAGNOSIS — F32A Depression, unspecified: Secondary | ICD-10-CM

## 2023-02-22 DIAGNOSIS — F4310 Post-traumatic stress disorder, unspecified: Secondary | ICD-10-CM

## 2023-02-22 DIAGNOSIS — F10251 Alcohol dependence with alcohol-induced psychotic disorder with hallucinations: Secondary | ICD-10-CM | POA: Diagnosis not present

## 2023-02-22 DIAGNOSIS — F419 Anxiety disorder, unspecified: Secondary | ICD-10-CM | POA: Diagnosis not present

## 2023-02-22 DIAGNOSIS — R748 Abnormal levels of other serum enzymes: Secondary | ICD-10-CM | POA: Diagnosis not present

## 2023-02-22 DIAGNOSIS — D61818 Other pancytopenia: Secondary | ICD-10-CM

## 2023-02-22 MED ORDER — AMLODIPINE BESYLATE 5 MG PO TABS: 5.0000 mg | ORAL_TABLET | Freq: Every day | ORAL | Status: DC

## 2023-02-22 MED ORDER — ESCITALOPRAM OXALATE 10 MG PO TABS: 10.0000 mg | ORAL_TABLET | Freq: Every morning | ORAL | 2 refills | Status: DC

## 2023-02-22 MED ORDER — FOLIC ACID 1 MG PO TABS: 1.0000 mg | ORAL_TABLET | Freq: Every day | ORAL | 2 refills | Status: DC

## 2023-02-22 MED ORDER — METOPROLOL TARTRATE 25 MG PO TABS: 12.5000 mg | ORAL_TABLET | Freq: Two times a day (BID) | ORAL | 1 refills | Status: DC

## 2023-02-22 MED ORDER — VITAMIN B-1 100 MG PO TABS: 100.0000 mg | ORAL_TABLET | Freq: Every day | ORAL | 2 refills | Status: DC

## 2023-02-22 MED ORDER — LOSARTAN POTASSIUM 50 MG PO TABS
25.0000 mg | ORAL_TABLET | Freq: Every day | ORAL | Status: DC
  Administered 2023-02-22: 25 mg via ORAL
  Filled 2023-02-22: qty 1

## 2023-02-22 MED ORDER — LOSARTAN POTASSIUM 25 MG PO TABS: 25.0000 mg | ORAL_TABLET | Freq: Every day | ORAL | 2 refills | Status: AC

## 2023-02-22 NOTE — Plan of Care (Signed)
  Problem: Education: Goal: Knowledge of disease or condition will improve Outcome: Progressing Goal: Understanding of discharge needs will improve Outcome: Progressing   Problem: Health Behavior/Discharge Planning: Goal: Ability to identify changes in lifestyle to reduce recurrence of condition will improve Outcome: Progressing Goal: Identification of resources available to assist in meeting health care needs will improve Outcome: Progressing   Problem: Physical Regulation: Goal: Complications related to the disease process, condition or treatment will be avoided or minimized Outcome: Progressing   Problem: Safety: Goal: Ability to remain free from injury will improve Outcome: Progressing   Problem: Education: Goal: Knowledge of General Education information will improve Description: Including pain rating scale, medication(s)/side effects and non-pharmacologic comfort measures Outcome: Progressing   Problem: Health Behavior/Discharge Planning: Goal: Ability to manage health-related needs will improve Outcome: Progressing   Problem: Clinical Measurements: Goal: Ability to maintain clinical measurements within normal limits will improve Outcome: Progressing Goal: Will remain free from infection Outcome: Progressing Goal: Diagnostic test results will improve Outcome: Progressing Goal: Respiratory complications will improve Outcome: Progressing Goal: Cardiovascular complication will be avoided Outcome: Progressing   Problem: Activity: Goal: Risk for activity intolerance will decrease Outcome: Progressing   Problem: Nutrition: Goal: Adequate nutrition will be maintained Outcome: Progressing   Problem: Coping: Goal: Level of anxiety will decrease Outcome: Progressing   Problem: Elimination: Goal: Will not experience complications related to bowel motility Outcome: Progressing Goal: Will not experience complications related to urinary retention Outcome:  Progressing   Problem: Pain Managment: Goal: General experience of comfort will improve Outcome: Progressing   Problem: Safety: Goal: Ability to remain free from injury will improve Outcome: Progressing   Problem: Skin Integrity: Goal: Risk for impaired skin integrity will decrease Outcome: Progressing   

## 2023-02-22 NOTE — Discharge Summary (Signed)
Physician Discharge Summary  Austin Houston ZOX:[id number] DOB: 01-25-1961 DOA: 02/17/2023  PCP: No primary care provider on file.  Admit date: 02/17/2023 Discharge date: 02/22/2023 Admitted From: Home Disposition: [facility] for alcohol rehab Recommendations for Outpatient Follow-up:  Follow up with PCP in 1 to 2 weeks Check CMP and CBC in 1 to 2 weeks Patient was started on Lexapro.  Reassess for anxiety/depression and adjust meds as appropriate Please follow up on the following pending results: None  Home Health: Not indicated Equipment/Devices: Not indicated  Discharge Condition: Stable CODE STATUS: Full code  Follow-up Information     Copland, Karleen Hampshire, MD Follow up.   Specialties: Family Medicine, Sports Medicine Why: Call the office and schedule a hospital follow up in the next 7-10 days. Contact information: [address] [place] [address] [phone]                 Hospital course 62 year old M with PMH of postop A-fib not on AC, alcohol abuse, HTN presenting from [facility] with confusion, increased hallucination and delirium and admitted for alcohol withdrawal management.  Reportedly drinks about 1.75 L of vodka and a few beers every 3 to 4 days for over 30 years.  In ED, slightly hypertensive.  EtOH level 155.  CMP with mildly elevated AST and ALT consistent with alcohol.  WBC 3.4.  Started on Librium taper and CIWA with as needed Ativan, and admitted.    Patient was agitated and confused the night of admission requiring Geodon injection and one-to-one Recruitment consultant.  Patient has been awake, alert and oriented not exhibiting any aggressive behaviors or significant alcohol withdrawal symptoms since 02/19/2023.  He was continued on Librium taper that he completed on the day of discharge.  [facility].  LFT and CBC improved.  Recommend repeat CMP and CBC in 1 week.  Patient reported struggle with  anxiety and depression for quite some time.  He says he was tried on Xanax by PCP in the past.  He never been on SSRI.  He has history of PTSD but not on treatment on medication since he was pretty functional.  Never been diagnosed with bipolar disorder.  Denies history of maniac symptoms.  After extensive discussion about risk and benefit, we have started Lexapro 10 mg daily.  He has upcoming appointment with PCP for annual physical in about a month.  Waiting on admission at [facility] for alcohol use disorder.  See individual problem list below for more.   Problems addressed during this hospitalization Principal Problem:   Alcoholism with psychosis (HCC) Active Problems:   PTSD (post-traumatic stress disorder)   Alcohol withdrawal (HCC)   Alcohol use disorder, severe   Anxiety and depression   Pancytopenia (HCC)   Elevated liver enzymes   Acute alcohol withdrawal with psychosis: EtOH level 155.  Increased confusion and agitation the night of admission requiring Geodon injection and Recruitment consultant.  Has been awake and alert and not agitated or confused since 02/19/2023.  Completed Librium taper.  -Rehab for alcohol at [facility] -Continue multivitamin, thiamine, folic acid   Anxiety/possible depression/history of PTSD: No suicidal ideation.  No psychosis.  No history of bipolar disorder.  Extensive discussion with patient about options of treatment including pharmacologics such as SSRI. -Started Lexapro 10 mg daily on 8/10.  Patient reports upcoming annual physical in 1 months with PCP   Transaminitis: Likely due to EtOH.  Stable. -Recheck CMP in  1 week   History of postop A-fib/intermittent bradycardia: Positive chronotropic effect to exertion.  Not on AC.  -Decreased metoprolol to 12.5 mg twice daily.   Essential hypertension: BP slightly elevated -Decrease metoprolol to 12.5 mg twice daily due to bradycardia -Added losartan 25 mg daily -Reassess and adjust meds as  appropriate   Hypokalemia/hypomagnesemia: Resolved.   Pancytopenia: Due to alcohol.  Anemia panel within normal.  Improved -Check CBC in 1 week   History of asthma -Continue home inhalers   History of prostate cancer -Outpatient follow-up           Time spent 35 minutes  Vital signs Vitals:   02/22/23 0428 02/22/23 0500 02/22/23 0605 02/22/23 0818  BP: (!) 169/87  (!) 153/74 (!) 157/85  Pulse: (!) 48  (!) 53 (!) 53  Temp:    98.4 F (36.9 C)  Resp:    17  Height:      Weight:  98.3 kg    SpO2:    97%  TempSrc:   Oral Oral  BMI (Calculated):  27.81       Discharge exam  GENERAL: No apparent distress.  Nontoxic. HEENT: MMM.  Vision and hearing grossly intact.  NECK: Supple.  No apparent JVD.  RESP:  No IWOB.  Fair aeration bilaterally. CVS: Bradycardic to 50s but increased to 60s with exertion.  Heart sounds normal.  ABD/GI/GU: BS+. Abd soft, NTND.  MSK/EXT:  Moves extremities. No apparent deformity. No edema.  SKIN: no apparent skin lesion or wound NEURO: Awake and alert. Oriented appropriately.  No apparent focal neuro deficit. PSYCH: Calm. Normal affect.   Discharge Instructions Discharge Instructions     Diet general   Complete by: As directed    Increase activity slowly   Complete by: As directed       Allergies as of 02/22/2023       Reactions   Vioxx [rofecoxib] Anaphylaxis, Other (See Comments)   Tolerates aspirin and naproxen.   Celebrex [celecoxib] Rash, Other (See Comments)   Tolerates aspirin and naproxen.   Codeine Rash, Other (See Comments)   Patient reports this happened as a child. He has tolerated Codeine since that time   Doxycycline Rash        Medication List     STOP taking these medications    diclofenac Sodium 1 % Gel Commonly known as: VOLTAREN   magnesium oxide 400 (240 Mg) MG tablet Commonly known as: MAG-OX   naltrexone 50 MG tablet Commonly known as: DEPADE   promethazine 25 MG suppository Commonly known  as: PHENERGAN       TAKE these medications    albuterol 108 (90 Base) MCG/ACT inhaler Commonly known as: VENTOLIN HFA INHALE 2 PUFFS INTO THE LUNGS EVERY 6 (SIX) HOURS AS NEEDED FOR WHEEZING. What changed:  how much to take how to take this when to take this reasons to take this   aspirin EC 81 MG tablet Take 81 mg by mouth daily. Swallow whole.   cetirizine 10 MG tablet Commonly known as: ZYRTEC Take 10 mg by mouth daily.   escitalopram 10 MG tablet Commonly known as: LEXAPRO Take 1 tablet (10 mg total) by mouth in the morning. Start taking on: February 23, 2023   fluticasone 50 MCG/ACT nasal spray Commonly known as: FLONASE Place 2 sprays into both nostrils daily. What changed: how much to take   folic acid 1 MG tablet Commonly known as: FOLVITE Take 1 tablet (1 mg total) by mouth daily.  Start taking on: February 23, 2023   losartan 25 MG tablet Commonly known as: COZAAR Take 1 tablet (25 mg total) by mouth daily.   LUBRICATING EYE DROPS OP Apply 2 drops to eye daily as needed (dry eyes).   metoprolol tartrate 25 MG tablet Commonly known as: LOPRESSOR Take 0.5 tablets (12.5 mg total) by mouth 2 (two) times daily. What changed: how much to take   montelukast 10 MG tablet Commonly known as: SINGULAIR Take 1 tablet (10 mg total) by mouth at bedtime. What changed: when to take this   multivitamin with minerals Tabs tablet Take 1 tablet by mouth daily.   omeprazole 40 MG capsule Commonly known as: PRILOSEC Take 1 capsule (40 mg total) by mouth daily.   ondansetron 4 MG tablet Commonly known as: Zofran Take 1 tablet (4 mg total) by mouth every 8 (eight) hours as needed for nausea or vomiting.   Symbicort 160-4.5 MCG/ACT inhaler Generic drug: budesonide-formoterol Inhale 2 puffs into the lungs in the morning and at bedtime.   tadalafil 5 MG tablet Commonly known as: CIALIS Take 5 mg by mouth daily.   thiamine 100 MG tablet Commonly known as: Vitamin  B-1 Take 1 tablet (100 mg total) by mouth daily. Start taking on: February 23, 2023        Consultations: None  Procedures/Studies   No results found.     The results of significant diagnostics from this hospitalization (including imaging, microbiology, ancillary and laboratory) are listed below for reference.     Microbiology: No results found for this or any previous visit (from the past 240 hour(s)).   Labs:  CBC: Recent Labs  Lab 02/17/23 2340 02/19/23 0259 02/20/23 0758 02/21/23 0256 02/22/23 0127  WBC 3.6* 3.4* 2.9* 2.9* 3.5*  HGB 12.2* 12.5* 12.3* 11.7* 11.8*  HCT 36.9* 37.9* 36.1* 34.8* 34.6*  MCV 92.7 93.1 93.5 92.6 94.3  PLT 113* 132* 147* 174 177   BMP &GFR Recent Labs  Lab 02/17/23 2340 02/19/23 0259 02/20/23 0758 02/21/23 0256 02/22/23 0127  NA 134* 137 137 134* 131*  K 3.4* 3.7 3.8 3.8 4.0  CL 95* 101 104 102 99  CO2 27 26 22 23 23   GLUCOSE 75 89 108* 107* 99  BUN 11 11 6 6 6   CREATININE 1.16 1.07 0.99 0.94 1.08  CALCIUM 9.0 8.8* 8.8* 8.6* 8.7*  MG 1.5* 1.9 1.8 1.7 1.7  PHOS 4.2 3.8 3.7 3.7  --    Estimated Creatinine Clearance: 84.6 mL/min (by C-G formula based on SCr of 1.08 mg/dL). Liver & Pancreas: Recent Labs  Lab 02/17/23 2340 02/19/23 0259 02/20/23 0758 02/21/23 0256 02/22/23 0127  AST 246* 242* 184* 162* 149*  ALT 129* 169* 182* 183* 189*  ALKPHOS 46 46 44 49 58  BILITOT 2.0* 1.3* 1.3* 1.0 1.0  PROT 4.7* 5.8* 5.8* 5.8* 5.7*  ALBUMIN 3.3* 3.1* 3.1* 3.0* 3.0*   No results for input(s): "LIPASE", "AMYLASE" in the last 168 hours. No results for input(s): "AMMONIA" in the last 168 hours. Diabetic: No results for input(s): "HGBA1C" in the last 72 hours. No results for input(s): "GLUCAP" in the last 168 hours. Cardiac Enzymes: Recent Labs  Lab 02/19/23 0259 02/21/23 0256  CKTOTAL 169 63   No results for input(s): "PROBNP" in the last 8760 hours. Coagulation Profile: No results for input(s): "INR", "PROTIME" in the  last 168 hours. Thyroid Function Tests: No results for input(s): "TSH", "T4TOTAL", "FREET4", "T3FREE", "THYROIDAB" in the last 72 hours. Lipid Profile:  No results for input(s): "CHOL", "HDL", "LDLCALC", "TRIG", "CHOLHDL", "LDLDIRECT" in the last 72 hours. Anemia Panel: Recent Labs    02/21/23 0256  VITAMINB12 1,039*  FOLATE 29.7  FERRITIN 233  TIBC 298  IRON 68  RETICCTPCT 2.4   Urine analysis:    Component Value Date/Time   COLORURINE AMBER (A) 02/15/2023 0950   APPEARANCEUR HAZY (A) 02/15/2023 0950   LABSPEC 1.015 02/15/2023 0950   PHURINE 5.0 02/15/2023 0950   GLUCOSEU NEGATIVE 02/15/2023 0950   HGBUR SMALL (A) 02/15/2023 0950   BILIRUBINUR NEGATIVE 02/15/2023 0950   KETONESUR 20 (A) 02/15/2023 0950   PROTEINUR 100 (A) 02/15/2023 0950   NITRITE NEGATIVE 02/15/2023 0950   LEUKOCYTESUR NEGATIVE 02/15/2023 0950   Sepsis Labs: Invalid input(s): "PROCALCITONIN", "LACTICIDVEN"   SIGNED:  Almon Hercules, MD  [facility] 02/22/2023, 12:47 PM

## 2023-02-22 NOTE — TOC Transition Note (Addendum)
Transition of Care [facility]) - CM/SW Discharge Note   Patient Details  Name: Austin Houston MRN: [id number] Date of Birth: Feb 11, 1961  [facility]) CM/SW Contact:  Janae Bridgeman, RN Phone Number: 02/22/2023, 2:57 PM   Clinical Narrative:    CM met with the patient at the bedside and patient is having telephone interview on the phone today with [facility].  Patient has a ready bed at the facility and plans to discharge to [facility] tomorrow 02/23/2023 at 8 am.  Discharge summary was faxed to [facility].  A copy of the Discharge Summary and copy of the prescriptions will be give to the patient to carry with him to [facility] in the am.  Patient's wife will drive the patient to the facility tomorrow morning by car.   Final next level of care: Other (comment) (Patient is exploring options for possible INpatient alcohol rehabilitation facility) Barriers to Discharge: Continued Medical Work up   Patient Goals and CMS Choice CMS Medicare.gov Compare Post Acute Care list provided to:: Patient Choice offered to / list presented to : Patient  Discharge Placement                         Discharge Plan and Services Additional resources added to the After Visit Summary for     Discharge Planning Services: CM Consult Post Acute Care Choice: Resumption of Svcs/PTA Provider (Patient given Resources for iNpatient Substance abuse counseling)                               Social Determinants of Health (SDOH) Interventions SDOH Screenings   Food Insecurity: No Food Insecurity (02/17/2023)  Housing: Patient Declined (02/17/2023)  Transportation Needs: No Transportation Needs (02/17/2023)  Utilities: Not At Risk (02/17/2023)  Depression (PHQ2-9): Low Risk  (02/16/2023)  Recent Concern: Depression (PHQ2-9) - Medium Risk (02/15/2023)  Tobacco Use: Medium Risk (02/22/2023)     Readmission Risk Interventions     02/19/2023    1:33 PM  Readmission Risk Prevention Plan  Transportation Screening Complete  PCP or Specialist Appt within 3-5 Days Complete  HRI or Home Care Consult Complete  Social Work Consult for Recovery Care Planning/Counseling Complete  Palliative Care Screening Complete  Medication Review [employer]) Complete

## 2023-02-23 DIAGNOSIS — I10 Essential (primary) hypertension: Secondary | ICD-10-CM | POA: Diagnosis not present

## 2023-02-23 DIAGNOSIS — F1020 Alcohol dependence, uncomplicated: Secondary | ICD-10-CM | POA: Diagnosis not present

## 2023-02-23 DIAGNOSIS — E785 Hyperlipidemia, unspecified: Secondary | ICD-10-CM | POA: Diagnosis not present

## 2023-02-23 DIAGNOSIS — R748 Abnormal levels of other serum enzymes: Secondary | ICD-10-CM | POA: Diagnosis not present

## 2023-02-23 DIAGNOSIS — R7401 Elevation of levels of liver transaminase levels: Secondary | ICD-10-CM | POA: Diagnosis not present

## 2023-02-23 DIAGNOSIS — F10251 Alcohol dependence with alcohol-induced psychotic disorder with hallucinations: Secondary | ICD-10-CM | POA: Diagnosis not present

## 2023-02-23 DIAGNOSIS — K219 Gastro-esophageal reflux disease without esophagitis: Secondary | ICD-10-CM | POA: Diagnosis not present

## 2023-02-23 DIAGNOSIS — J45909 Unspecified asthma, uncomplicated: Secondary | ICD-10-CM | POA: Diagnosis not present

## 2023-02-23 DIAGNOSIS — K760 Fatty (change of) liver, not elsewhere classified: Secondary | ICD-10-CM | POA: Diagnosis not present

## 2023-02-23 DIAGNOSIS — F329 Major depressive disorder, single episode, unspecified: Secondary | ICD-10-CM | POA: Diagnosis not present

## 2023-02-23 DIAGNOSIS — F419 Anxiety disorder, unspecified: Secondary | ICD-10-CM | POA: Diagnosis not present

## 2023-02-23 DIAGNOSIS — F4310 Post-traumatic stress disorder, unspecified: Secondary | ICD-10-CM | POA: Diagnosis not present

## 2023-02-23 NOTE — Discharge Summary (Signed)
Physician Discharge Summary  CEYLON MAXIM BJY:[id number] DOB: 22-Feb-1961 DOA: 02/17/2023  PCP: No primary care provider on file.  Admit date: 02/17/2023 Discharge date: 02/23/2023 Admitted From: Home Disposition: [facility] for alcohol rehab Recommendations for Outpatient Follow-up:  Follow up with PCP in 1 to 2 weeks Check CMP and CBC in 1 to 2 weeks Patient was started on Lexapro.  Reassess for anxiety/depression and adjust meds as appropriate Please follow up on the following pending results: None  Home Health: Not indicated Equipment/Devices: Not indicated  Discharge Condition: Stable CODE STATUS: Full code  Follow-up Information     Copland, Karleen Hampshire, MD Follow up.   Specialties: Family Medicine, Sports Medicine Why: Call the office and schedule a hospital follow up in the next 7-10 days. Contact information: [address] [place] [address] [phone]                 Hospital course 62 year old M with PMH of postop A-fib not on AC, alcohol abuse, HTN presenting from [facility] with confusion, increased hallucination and delirium and admitted for alcohol withdrawal management.  Reportedly drinks about 1.75 L of vodka and a few beers every 3 to 4 days for over 30 years.  In ED, slightly hypertensive.  EtOH level 155.  CMP with mildly elevated AST and ALT consistent with alcohol.  WBC 3.4.  Started on Librium taper and CIWA with as needed Ativan, and admitted.    Patient was agitated and confused the night of admission requiring Geodon injection and one-to-one Recruitment consultant.  Patient has been awake, alert and oriented not exhibiting any aggressive behaviors or significant alcohol withdrawal symptoms since 02/19/2023.  He was continued on Librium taper that he completed on the day of discharge.  He will be discharged to [facility] for further rehabilitation.  LFT and CBC improved.  Recommend repeat CMP and CBC in 1 week.  Patient reported struggle with  anxiety and depression for quite some time.  He says he was tried on Xanax by PCP in the past.  He never been on SSRI.  He has history of PTSD but not on treatment on medication since he was pretty functional.  Never been diagnosed with bipolar disorder.  Denies history of maniac symptoms.  After extensive discussion about risk and benefit, we have started Lexapro 10 mg daily.  He has upcoming appointment with PCP for annual physical in about a month.  Waiting on admission at [facility] for alcohol use disorder.  See individual problem list below for more.   Problems addressed during this hospitalization Principal Problem:   Alcoholism with psychosis (HCC) Active Problems:   PTSD (post-traumatic stress disorder)   Alcohol withdrawal (HCC)   Alcohol use disorder, severe   Anxiety and depression   Pancytopenia (HCC)   Elevated liver enzymes   Acute alcohol withdrawal with psychosis: EtOH level 155.  Increased confusion and agitation the night of admission requiring Geodon injection and Recruitment consultant.  Has been awake and alert and not agitated or confused since 02/19/2023.  Completed Librium taper.  -Rehab for alcohol at [facility] -Continue multivitamin, thiamine, folic acid   Anxiety/possible depression/history of PTSD: No suicidal ideation.  No psychosis.  No history of bipolar disorder.  Extensive discussion with patient about options of treatment including pharmacologics such as SSRI. -Started Lexapro 10 mg daily on 8/10.  Patient reports upcoming annual physical in 1 months with PCP   Transaminitis: Likely due to EtOH.  Stable. -Recheck CMP in  1 week   History of postop A-fib/intermittent bradycardia: Positive chronotropic effect to exertion.  Not on AC.  -Decreased metoprolol to 12.5 mg twice daily.   Essential hypertension: BP slightly elevated -Decrease metoprolol to 12.5 mg twice daily due to bradycardia -Added losartan 25 mg daily -Reassess and adjust meds as  appropriate   Hypokalemia/hypomagnesemia: Resolved.   Pancytopenia: Due to alcohol.  Anemia panel within normal.  Improved -Check CBC in 1 week   History of asthma -Continue home inhalers   History of prostate cancer -Outpatient follow-up           Time spent 35 minutes  Vital signs Vitals:   02/22/23 0605 02/22/23 0818 02/22/23 1717 02/22/23 2050  BP: (!) 153/74 (!) 157/85 (!) 132/93 (!) 140/95  Pulse: (!) 53 (!) 53 (!) 57 (!) 54  Temp:  98.4 F (36.9 C) 97.7 F (36.5 C) (!) 97.5 F (36.4 C)  Resp:  17 17 17   Height:      Weight:      SpO2:  97% 98% 100%  TempSrc: Oral Oral Oral   BMI (Calculated):         Discharge exam  GENERAL: No apparent distress.  Nontoxic. HEENT: MMM.  Vision and hearing grossly intact.  NECK: Supple.  No apparent JVD.  RESP:  No IWOB.  Fair aeration bilaterally. CVS: Bradycardic to 50s but increased to 60s with exertion.  Heart sounds normal.  ABD/GI/GU: BS+. Abd soft, NTND.  MSK/EXT:  Moves extremities. No apparent deformity. No edema.  SKIN: no apparent skin lesion or wound NEURO: Awake and alert. Oriented appropriately.  No apparent focal neuro deficit. PSYCH: Calm. Normal affect.   Discharge Instructions Discharge Instructions     Diet general   Complete by: As directed    Increase activity slowly   Complete by: As directed       Allergies as of 02/23/2023       Reactions   Vioxx [rofecoxib] Anaphylaxis, Other (See Comments)   Tolerates aspirin and naproxen.   Celebrex [celecoxib] Rash, Other (See Comments)   Tolerates aspirin and naproxen.   Codeine Rash, Other (See Comments)   Patient reports this happened as a child. He has tolerated Codeine since that time   Doxycycline Rash        Medication List     STOP taking these medications    diclofenac Sodium 1 % Gel Commonly known as: VOLTAREN   magnesium oxide 400 (240 Mg) MG tablet Commonly known as: MAG-OX   naltrexone 50 MG tablet Commonly known as:  DEPADE   promethazine 25 MG suppository Commonly known as: PHENERGAN       TAKE these medications    albuterol 108 (90 Base) MCG/ACT inhaler Commonly known as: VENTOLIN HFA INHALE 2 PUFFS INTO THE LUNGS EVERY 6 (SIX) HOURS AS NEEDED FOR WHEEZING. What changed:  how much to take how to take this when to take this reasons to take this   aspirin EC 81 MG tablet Take 81 mg by mouth daily. Swallow whole.   cetirizine 10 MG tablet Commonly known as: ZYRTEC Take 10 mg by mouth daily.   escitalopram 10 MG tablet Commonly known as: LEXAPRO Take 1 tablet (10 mg total) by mouth in the morning.   fluticasone 50 MCG/ACT nasal spray Commonly known as: FLONASE Place 2 sprays into both nostrils daily. What changed: how much to take   folic acid 1 MG tablet Commonly known as: FOLVITE Take 1 tablet (1 mg total) by  mouth daily.   losartan 25 MG tablet Commonly known as: COZAAR Take 1 tablet (25 mg total) by mouth daily.   LUBRICATING EYE DROPS OP Apply 2 drops to eye daily as needed (dry eyes).   metoprolol tartrate 25 MG tablet Commonly known as: LOPRESSOR Take 0.5 tablets (12.5 mg total) by mouth 2 (two) times daily. What changed: how much to take   montelukast 10 MG tablet Commonly known as: SINGULAIR Take 1 tablet (10 mg total) by mouth at bedtime. What changed: when to take this   multivitamin with minerals Tabs tablet Take 1 tablet by mouth daily.   omeprazole 40 MG capsule Commonly known as: PRILOSEC Take 1 capsule (40 mg total) by mouth daily.   ondansetron 4 MG tablet Commonly known as: Zofran Take 1 tablet (4 mg total) by mouth every 8 (eight) hours as needed for nausea or vomiting.   Symbicort 160-4.5 MCG/ACT inhaler Generic drug: budesonide-formoterol Inhale 2 puffs into the lungs in the morning and at bedtime.   tadalafil 5 MG tablet Commonly known as: CIALIS Take 5 mg by mouth daily.   thiamine 100 MG tablet Commonly known as: Vitamin B-1 Take 1  tablet (100 mg total) by mouth daily.        Consultations: None  Procedures/Studies   No results found.     The results of significant diagnostics from this hospitalization (including imaging, microbiology, ancillary and laboratory) are listed below for reference.     Microbiology: No results found for this or any previous visit (from the past 240 hour(s)).   Labs:  CBC: Recent Labs  Lab 02/17/23 2340 02/19/23 0259 02/20/23 0758 02/21/23 0256 02/22/23 0127  WBC 3.6* 3.4* 2.9* 2.9* 3.5*  HGB 12.2* 12.5* 12.3* 11.7* 11.8*  HCT 36.9* 37.9* 36.1* 34.8* 34.6*  MCV 92.7 93.1 93.5 92.6 94.3  PLT 113* 132* 147* 174 177   BMP &GFR Recent Labs  Lab 02/17/23 2340 02/19/23 0259 02/20/23 0758 02/21/23 0256 02/22/23 0127  NA 134* 137 137 134* 131*  K 3.4* 3.7 3.8 3.8 4.0  CL 95* 101 104 102 99  CO2 27 26 22 23 23   GLUCOSE 75 89 108* 107* 99  BUN 11 11 6 6 6   CREATININE 1.16 1.07 0.99 0.94 1.08  CALCIUM 9.0 8.8* 8.8* 8.6* 8.7*  MG 1.5* 1.9 1.8 1.7 1.7  PHOS 4.2 3.8 3.7 3.7  --    Estimated Creatinine Clearance: 84.6 mL/min (by C-G formula based on SCr of 1.08 mg/dL). Liver & Pancreas: Recent Labs  Lab 02/17/23 2340 02/19/23 0259 02/20/23 0758 02/21/23 0256 02/22/23 0127  AST 246* 242* 184* 162* 149*  ALT 129* 169* 182* 183* 189*  ALKPHOS 46 46 44 49 58  BILITOT 2.0* 1.3* 1.3* 1.0 1.0  PROT 4.7* 5.8* 5.8* 5.8* 5.7*  ALBUMIN 3.3* 3.1* 3.1* 3.0* 3.0*   No results for input(s): "LIPASE", "AMYLASE" in the last 168 hours. No results for input(s): "AMMONIA" in the last 168 hours. Diabetic: No results for input(s): "HGBA1C" in the last 72 hours. No results for input(s): "GLUCAP" in the last 168 hours. Cardiac Enzymes: Recent Labs  Lab 02/19/23 0259 02/21/23 0256  CKTOTAL 169 63   No results for input(s): "PROBNP" in the last 8760 hours. Coagulation Profile: No results for input(s): "INR", "PROTIME" in the last 168 hours. Thyroid Function Tests: No  results for input(s): "TSH", "T4TOTAL", "FREET4", "T3FREE", "THYROIDAB" in the last 72 hours. Lipid Profile: No results for input(s): "CHOL", "HDL", "LDLCALC", "TRIG", "CHOLHDL", "LDLDIRECT"  in the last 72 hours. Anemia Panel: Recent Labs    02/21/23 0256  VITAMINB12 1,039*  FOLATE 29.7  FERRITIN 233  TIBC 298  IRON 68  RETICCTPCT 2.4   Urine analysis:    Component Value Date/Time   COLORURINE AMBER (A) 02/15/2023 0950   APPEARANCEUR HAZY (A) 02/15/2023 0950   LABSPEC 1.015 02/15/2023 0950   PHURINE 5.0 02/15/2023 0950   GLUCOSEU NEGATIVE 02/15/2023 0950   HGBUR SMALL (A) 02/15/2023 0950   BILIRUBINUR NEGATIVE 02/15/2023 0950   KETONESUR 20 (A) 02/15/2023 0950   PROTEINUR 100 (A) 02/15/2023 0950   NITRITE NEGATIVE 02/15/2023 0950   LEUKOCYTESUR NEGATIVE 02/15/2023 0950   Sepsis Labs: Invalid input(s): "PROCALCITONIN", "LACTICIDVEN"   SIGNED:  Almon Hercules, MD  [facility] 02/23/2023, 6:52 AM

## 2023-02-23 NOTE — Plan of Care (Signed)
Problem: Education: Goal: Knowledge of disease or condition will improve 02/23/2023 0616 by Marylene Land, RN Outcome: Adequate for Discharge 02/23/2023 0602 by Marylene Land, RN Outcome: Progressing Goal: Understanding of discharge needs will improve 02/23/2023 0616 by Marylene Land, RN Outcome: Adequate for Discharge 02/23/2023 0602 by Marylene Land, RN Outcome: Progressing   Problem: Health Behavior/Discharge Planning: Goal: Ability to identify changes in lifestyle to reduce recurrence of condition will improve 02/23/2023 0616 by Marylene Land, RN Outcome: Adequate for Discharge 02/23/2023 0602 by Marylene Land, RN Outcome: Progressing Goal: Identification of resources available to assist in meeting health care needs will improve 02/23/2023 0616 by Marylene Land, RN Outcome: Adequate for Discharge 02/23/2023 0602 by Marylene Land, RN Outcome: Progressing   Problem: Physical Regulation: Goal: Complications related to the disease process, condition or treatment will be avoided or minimized 02/23/2023 0616 by Marylene Land, RN Outcome: Adequate for Discharge 02/23/2023 0602 by Marylene Land, RN Outcome: Progressing   Problem: Safety: Goal: Ability to remain free from injury will improve 02/23/2023 0616 by Marylene Land, RN Outcome: Adequate for Discharge 02/23/2023 0602 by Marylene Land, RN Outcome: Progressing   Problem: Education: Goal: Knowledge of General Education information will improve Description: Including pain rating scale, medication(s)/side effects and non-pharmacologic comfort measures 02/23/2023 0616 by Marylene Land, RN Outcome: Adequate for Discharge 02/23/2023 0602 by Marylene Land, RN Outcome: Progressing   Problem: Health Behavior/Discharge Planning: Goal: Ability to manage health-related needs will improve 02/23/2023 0616 by Marylene Land, RN Outcome: Adequate for Discharge 02/23/2023 0602 by  Marylene Land, RN Outcome: Progressing   Problem: Clinical Measurements: Goal: Ability to maintain clinical measurements within normal limits will improve 02/23/2023 0616 by Marylene Land, RN Outcome: Adequate for Discharge 02/23/2023 0602 by Marylene Land, RN Outcome: Progressing Goal: Will remain free from infection 02/23/2023 0616 by Marylene Land, RN Outcome: Adequate for Discharge 02/23/2023 0602 by Marylene Land, RN Outcome: Progressing Goal: Diagnostic test results will improve 02/23/2023 0616 by Marylene Land, RN Outcome: Adequate for Discharge 02/23/2023 0602 by Marylene Land, RN Outcome: Progressing Goal: Respiratory complications will improve 02/23/2023 0616 by Marylene Land, RN Outcome: Adequate for Discharge 02/23/2023 0602 by Marylene Land, RN Outcome: Progressing Goal: Cardiovascular complication will be avoided 02/23/2023 0616 by Marylene Land, RN Outcome: Adequate for Discharge 02/23/2023 0602 by Marylene Land, RN Outcome: Progressing   Problem: Activity: Goal: Risk for activity intolerance will decrease 02/23/2023 0616 by Marylene Land, RN Outcome: Adequate for Discharge 02/23/2023 0602 by Marylene Land, RN Outcome: Progressing   Problem: Nutrition: Goal: Adequate nutrition will be maintained 02/23/2023 0616 by Marylene Land, RN Outcome: Adequate for Discharge 02/23/2023 0602 by Marylene Land, RN Outcome: Progressing   Problem: Coping: Goal: Level of anxiety will decrease 02/23/2023 0616 by Marylene Land, RN Outcome: Adequate for Discharge 02/23/2023 0602 by Marylene Land, RN Outcome: Progressing   Problem: Elimination: Goal: Will not experience complications related to bowel motility 02/23/2023 0616 by Marylene Land, RN Outcome: Adequate for Discharge 02/23/2023 0602 by Marylene Land, RN Outcome: Progressing Goal: Will not experience complications related to urinary  retention 02/23/2023 0616 by Marylene Land, RN Outcome: Adequate for Discharge 02/23/2023 0602 by Marylene Land, RN Outcome: Progressing   Problem: Pain Managment: Goal: General experience of comfort will improve 02/23/2023 0616 by Marylene Land, RN Outcome: Adequate for Discharge 02/23/2023 0602 by Marylene Land, RN Outcome: Progressing   Problem: Safety: Goal: Ability to remain free from injury will improve 02/23/2023 0616 by Marylene Land, RN Outcome: Adequate for Discharge 02/23/2023 0602 by Marylene Land, RN Outcome: Progressing  Problem: Skin Integrity: Goal: Risk for impaired skin integrity will decrease 02/23/2023 0616 by Marylene Land, RN Outcome: Adequate for Discharge 02/23/2023 0602 by Marylene Land, RN Outcome: Progressing

## 2023-02-23 NOTE — Plan of Care (Signed)
  Problem: Education: Goal: Knowledge of disease or condition will improve Outcome: Progressing Goal: Understanding of discharge needs will improve Outcome: Progressing   Problem: Health Behavior/Discharge Planning: Goal: Ability to identify changes in lifestyle to reduce recurrence of condition will improve Outcome: Progressing Goal: Identification of resources available to assist in meeting health care needs will improve Outcome: Progressing   Problem: Physical Regulation: Goal: Complications related to the disease process, condition or treatment will be avoided or minimized Outcome: Progressing   Problem: Safety: Goal: Ability to remain free from injury will improve Outcome: Progressing   Problem: Education: Goal: Knowledge of General Education information will improve Description: Including pain rating scale, medication(s)/side effects and non-pharmacologic comfort measures Outcome: Progressing   Problem: Health Behavior/Discharge Planning: Goal: Ability to manage health-related needs will improve Outcome: Progressing   Problem: Clinical Measurements: Goal: Ability to maintain clinical measurements within normal limits will improve Outcome: Progressing Goal: Will remain free from infection Outcome: Progressing Goal: Diagnostic test results will improve Outcome: Progressing Goal: Respiratory complications will improve Outcome: Progressing Goal: Cardiovascular complication will be avoided Outcome: Progressing   Problem: Safety: Goal: Ability to remain free from injury will improve Outcome: Progressing

## 2023-02-24 ENCOUNTER — Telehealth: Payer: Self-pay

## 2023-02-24 ENCOUNTER — Ambulatory Visit: Payer: 59 | Admitting: Primary Care

## 2023-02-24 NOTE — Transitions of Care (Post Inpatient/ED Visit) (Unsigned)
   02/24/2023  Name: Austin Houston MRN: [id number] DOB: 03/25/1961  Today's TOC FU Call Status: Today's TOC FU Call Status:: Unsuccessful Call (1st Attempt) Unsuccessful Call (1st Attempt) Date: 02/24/23  Attempted to reach the patient regarding the most recent Inpatient/ED visit.  Follow Up Plan: Additional outreach attempts will be made to reach the patient to complete the Transitions of Care (Post Inpatient/ED visit) call.   Signature  Woodfin Ganja LPN [facility] Nurse Health Advisor Direct Dial [phone]

## 2023-02-24 NOTE — Transitions of Care (Post Inpatient/ED Visit) (Unsigned)
   02/24/2023  Name: Austin Houston MRN: [id number] DOB: 05-26-1961  Today's TOC FU Call Status:    Attempted to reach the patient regarding the most recent Inpatient/ED visit.  Follow Up Plan: Additional outreach attempts will be made to reach the patient to complete the Transitions of Care (Post Inpatient/ED visit) call.   Signature   Woodfin Ganja LPN [facility] Nurse Health Advisor Direct Dial [phone]

## 2023-02-25 ENCOUNTER — Encounter (INDEPENDENT_AMBULATORY_CARE_PROVIDER_SITE_OTHER): Payer: Self-pay

## 2023-02-25 NOTE — Transitions of Care (Post Inpatient/ED Visit) (Signed)
   02/25/2023  Name: TIERRE HELLYER MRN: [id number] DOB: [birth year]  Today's TOC FU Call Status: Today's TOC FU Call Status:: Unsuccessful Call (2nd Attempt) Unsuccessful Call (1st Attempt) Date: 02/24/23 Unsuccessful Call (2nd Attempt) Date: 02/25/23  Attempted to reach the patient regarding the most recent Inpatient/ED visit.  Follow Up Plan: No further outreach attempts will be made at this time. We have been unable to contact the patient.  Signature   Woodfin Ganja LPN [facility] Nurse Health Advisor Direct Dial [phone]

## 2023-03-22 ENCOUNTER — Telehealth: Payer: Self-pay | Admitting: *Deleted

## 2023-03-22 NOTE — Telephone Encounter (Signed)
Please review recent hospital labs.  I don't think he needs any CPE labs except maybe a PSA.  Please advise.

## 2023-03-22 NOTE — Telephone Encounter (Signed)
-----   Message from Alvina Chou sent at 03/12/2023  9:59 AM EDT ----- Regarding: lab orders for Mon, 9.16.24 Patient is scheduled for CPX labs, please order future labs, Thanks , Camelia Eng

## 2023-03-23 ENCOUNTER — Other Ambulatory Visit: Payer: Self-pay

## 2023-03-23 MED ORDER — ESCITALOPRAM OXALATE 10 MG PO TABS
10.0000 mg | ORAL_TABLET | Freq: Every day | ORAL | 0 refills | Status: DC
  Filled 2023-03-23: qty 30, 30d supply, fill #0

## 2023-03-23 MED ORDER — NALTREXONE HCL 50 MG PO TABS
50.0000 mg | ORAL_TABLET | Freq: Every day | ORAL | 0 refills | Status: DC
  Filled 2023-03-23: qty 30, 30d supply, fill #0

## 2023-03-23 MED ORDER — MELATONIN 3 MG PO TABS
3.0000 mg | ORAL_TABLET | Freq: Every day | ORAL | 0 refills | Status: DC
  Filled 2023-03-23: qty 60, 60d supply, fill #0

## 2023-03-23 MED ORDER — TRAZODONE HCL 50 MG PO TABS
50.0000 mg | ORAL_TABLET | Freq: Every day | ORAL | 0 refills | Status: DC
  Filled 2023-03-23: qty 30, 30d supply, fill #0

## 2023-03-23 NOTE — Telephone Encounter (Signed)
Yes.  No need for fasting labs.  Can you let Austin Houston know that we will just check a couple of labs when he is in the office.

## 2023-03-23 NOTE — Telephone Encounter (Signed)
Left message for Austin Houston that pre fasting CPE labs are not needed due to most of these labs were checked when he was in the hospital.  Lab appointment cancelled.

## 2023-03-25 DIAGNOSIS — F329 Major depressive disorder, single episode, unspecified: Secondary | ICD-10-CM | POA: Diagnosis not present

## 2023-03-25 DIAGNOSIS — F419 Anxiety disorder, unspecified: Secondary | ICD-10-CM | POA: Diagnosis not present

## 2023-03-25 DIAGNOSIS — R7401 Elevation of levels of liver transaminase levels: Secondary | ICD-10-CM | POA: Diagnosis not present

## 2023-03-25 DIAGNOSIS — K760 Fatty (change of) liver, not elsewhere classified: Secondary | ICD-10-CM | POA: Diagnosis not present

## 2023-03-25 DIAGNOSIS — J45909 Unspecified asthma, uncomplicated: Secondary | ICD-10-CM | POA: Diagnosis not present

## 2023-03-25 DIAGNOSIS — I10 Essential (primary) hypertension: Secondary | ICD-10-CM | POA: Diagnosis not present

## 2023-03-25 DIAGNOSIS — E785 Hyperlipidemia, unspecified: Secondary | ICD-10-CM | POA: Diagnosis not present

## 2023-03-25 DIAGNOSIS — K219 Gastro-esophageal reflux disease without esophagitis: Secondary | ICD-10-CM | POA: Diagnosis not present

## 2023-03-25 DIAGNOSIS — F1020 Alcohol dependence, uncomplicated: Secondary | ICD-10-CM | POA: Diagnosis not present

## 2023-03-29 ENCOUNTER — Other Ambulatory Visit: Payer: 59

## 2023-03-29 DIAGNOSIS — E785 Hyperlipidemia, unspecified: Secondary | ICD-10-CM | POA: Diagnosis not present

## 2023-03-29 DIAGNOSIS — K760 Fatty (change of) liver, not elsewhere classified: Secondary | ICD-10-CM | POA: Diagnosis not present

## 2023-03-29 DIAGNOSIS — F329 Major depressive disorder, single episode, unspecified: Secondary | ICD-10-CM | POA: Diagnosis not present

## 2023-03-29 DIAGNOSIS — R7401 Elevation of levels of liver transaminase levels: Secondary | ICD-10-CM | POA: Diagnosis not present

## 2023-03-29 DIAGNOSIS — F419 Anxiety disorder, unspecified: Secondary | ICD-10-CM | POA: Diagnosis not present

## 2023-03-29 DIAGNOSIS — F1020 Alcohol dependence, uncomplicated: Secondary | ICD-10-CM | POA: Diagnosis not present

## 2023-03-29 DIAGNOSIS — K219 Gastro-esophageal reflux disease without esophagitis: Secondary | ICD-10-CM | POA: Diagnosis not present

## 2023-03-29 DIAGNOSIS — I10 Essential (primary) hypertension: Secondary | ICD-10-CM | POA: Diagnosis not present

## 2023-03-29 DIAGNOSIS — J45909 Unspecified asthma, uncomplicated: Secondary | ICD-10-CM | POA: Diagnosis not present

## 2023-03-30 DIAGNOSIS — E785 Hyperlipidemia, unspecified: Secondary | ICD-10-CM | POA: Diagnosis not present

## 2023-03-30 DIAGNOSIS — J45909 Unspecified asthma, uncomplicated: Secondary | ICD-10-CM | POA: Diagnosis not present

## 2023-03-30 DIAGNOSIS — R7401 Elevation of levels of liver transaminase levels: Secondary | ICD-10-CM | POA: Diagnosis not present

## 2023-03-30 DIAGNOSIS — F419 Anxiety disorder, unspecified: Secondary | ICD-10-CM | POA: Diagnosis not present

## 2023-03-30 DIAGNOSIS — F1020 Alcohol dependence, uncomplicated: Secondary | ICD-10-CM | POA: Diagnosis not present

## 2023-03-30 DIAGNOSIS — I10 Essential (primary) hypertension: Secondary | ICD-10-CM | POA: Diagnosis not present

## 2023-03-30 DIAGNOSIS — F329 Major depressive disorder, single episode, unspecified: Secondary | ICD-10-CM | POA: Diagnosis not present

## 2023-03-30 DIAGNOSIS — K219 Gastro-esophageal reflux disease without esophagitis: Secondary | ICD-10-CM | POA: Diagnosis not present

## 2023-03-30 DIAGNOSIS — K760 Fatty (change of) liver, not elsewhere classified: Secondary | ICD-10-CM | POA: Diagnosis not present

## 2023-04-01 DIAGNOSIS — K219 Gastro-esophageal reflux disease without esophagitis: Secondary | ICD-10-CM | POA: Diagnosis not present

## 2023-04-01 DIAGNOSIS — F329 Major depressive disorder, single episode, unspecified: Secondary | ICD-10-CM | POA: Diagnosis not present

## 2023-04-01 DIAGNOSIS — I10 Essential (primary) hypertension: Secondary | ICD-10-CM | POA: Diagnosis not present

## 2023-04-01 DIAGNOSIS — E785 Hyperlipidemia, unspecified: Secondary | ICD-10-CM | POA: Diagnosis not present

## 2023-04-01 DIAGNOSIS — R7401 Elevation of levels of liver transaminase levels: Secondary | ICD-10-CM | POA: Diagnosis not present

## 2023-04-01 DIAGNOSIS — F419 Anxiety disorder, unspecified: Secondary | ICD-10-CM | POA: Diagnosis not present

## 2023-04-01 DIAGNOSIS — F1020 Alcohol dependence, uncomplicated: Secondary | ICD-10-CM | POA: Diagnosis not present

## 2023-04-01 DIAGNOSIS — K760 Fatty (change of) liver, not elsewhere classified: Secondary | ICD-10-CM | POA: Diagnosis not present

## 2023-04-01 DIAGNOSIS — J45909 Unspecified asthma, uncomplicated: Secondary | ICD-10-CM | POA: Diagnosis not present

## 2023-04-04 ENCOUNTER — Other Ambulatory Visit: Payer: Self-pay

## 2023-04-04 MED FILL — Montelukast Sodium Tab 10 MG (Base Equiv): ORAL | 30 days supply | Qty: 30 | Fill #1 | Status: AC

## 2023-04-04 NOTE — Progress Notes (Unsigned)
Austin Hilger T. Stacye Noori, MD, CAQ Sports Medicine [facility] at [facility] [address] [place], [address]  Phone: [phone]  FAX: [phone]  Austin Houston - 62 y.o. male  MRN [id number]  Date of Birth: Jun 27, 1961  Date: 04/05/2023  PCP: Hannah Beat, MD  Referral: Hannah Beat, MD  No chief complaint on file.  Patient Care Team: Hannah Beat, MD as PCP - General (Family Medicine) Iran Ouch, MD as PCP - Cardiology (Cardiology) Subjective:   Austin Houston is a 62 y.o. pleasant patient who presents with the following:  Preventative Health Maintenance Visit:  Health Maintenance Summary Reviewed and updated, unless pt declines services.  Tobacco History Reviewed. Alcohol: No concerns, no excessive use Exercise Habits: Some activity, rec at least 30 mins 5 times a week STD concerns: no risk or activity to increase risk Drug Use: None  Smoke he is here for a complete physical exam.  He is very well-known.  He was recently hospitalized with alcoholism, subsequently had full DTs and was detox and then spent a stent at [facility].  Additionally, LFTs were quite high during this time.  Historically has had some fairly mild elevated LFTs over the years.  Lab Results  Component Value Date   ALT 189 (H) 02/22/2023   AST 149 (H) 02/22/2023   ALKPHOS 58 02/22/2023   BILITOT 1.0 02/22/2023    Lab Results  Component Value Date   WBC 3.5 (L) 02/22/2023   HGB 11.8 (L) 02/22/2023   HCT 34.6 (L) 02/22/2023   MCV 94.3 02/22/2023   PLT 177 02/22/2023      Health Maintenance  Topic Date Due   INFLUENZA VACCINE  02/11/2023   COVID-19 Vaccine (5 - 2023-24 season) 03/14/2023   Colonoscopy  01/22/2026   DTaP/Tdap/Td (5 - Td or Tdap) 08/28/2032   Hepatitis C Screening  Completed   HIV Screening  Completed   Zoster Vaccines- Shingrix  Completed   HPV VACCINES  Aged Out   Immunization History  Administered Date(s)  Administered   Anthrax 09/14/2001, 09/28/2001, 06/16/2002, 03/24/2003   Hepatitis A, Adult 02/11/1996   Hepatitis B, ADULT 12/12/1982   Influenza Nasal 03/19/2007   Influenza Split 09/10/2016   Influenza Whole 05/13/1996, 07/22/1997, 07/20/1999, 06/13/2000, 06/30/2001, 04/30/2002   Influenza, Quadrivalent, Recombinant, Inj, Pf 04/13/2018   Influenza,inj,Quad PF,6+ Mos 04/13/2015, 04/01/2020   Influenza-Unspecified 03/31/2017, 04/14/2019   MMR 08/19/1997   Meningococcal polysaccharide vaccine (MPSV4) 06/13/2000, 06/13/2005   OPV 09/10/1985   PFIZER(Purple Top)SARS-COV-2 Vaccination 07/28/2019, 08/17/2019, 04/01/2020   Pfizer Covid-19 Vaccine Bivalent Booster 53yrs & up 07/24/2021   Td 02/11/1996, 03/13/2004   Tdap 10/21/2016, 08/28/2022   Typhoid Inactivated 07/18/2005   Typhoid Parenteral 12/11/1998, 07/23/2001, 07/21/2003   Typhoid Parenteral, AKD ([place] [employer]) 12/12/1995   Yellow Fever 06/13/1995, 06/13/2005   Zoster Recombinant(Shingrix) 12/12/2020, 07/24/2021   Patient Active Problem List   Diagnosis Date Noted   Prostate cancer (HCC) 01/08/2016    Priority: High   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HCC)     Priority: High   Elevated liver enzymes 02/21/2023    Priority: Medium    Mild persistent asthma without complication 01/24/2020    Priority: Medium    Hypertension     Priority: Medium    Hyperlipidemia     Priority: Medium    Former smokeless tobacco use 11/25/2012    Priority: Low   GERD (gastroesophageal reflux disease)     Priority: Low   Allergic rhinitis due to  pollen     Priority: Low   Anxiety and depression 02/21/2023   Pancytopenia (HCC) 02/21/2023   Alcohol use disorder, severe 02/16/2023   S/P ascending aortic aneurysm repair 09/07/2022   Aneurysm of ascending aorta without rupture (HCC) 07/20/2022   Family history of prostate cancer in father 06/19/2014   Varicose veins of lower extremities with other complications 03/01/2013   PTSD (post-traumatic  stress disorder)     Past Medical History:  Diagnosis Date   Actinic keratosis    Allergic rhinitis due to pollen    Asthma    vs COPD- taking inhalers- as related to allergies only.   Basal cell carcinoma 03/26/2020   R lat deltoid - ED&C    Dysplastic nevus 03/07/2020   L costal infrapectoral - moderate   Dysplastic nevus 03/07/2020   R post flank above waistline - moderate   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HCC)    s/p partial thyroidectomy (follicular adenoma)-surgery only   GERD (gastroesophageal reflux disease)    Hearing impaired person, bilateral    hearing aida bilateral"high frequency loss   Hyperlipidemia    Hypertension    Prostate cancer (HCC) 01/08/2016   PTSD (post-traumatic stress disorder)    s/p multiple active deployments for [employer], no medications taken currently    Past Surgical History:  Procedure Laterality Date   COLONOSCOPY W/ POLYPECTOMY     age 2 "adenoma"   INGUINAL HERNIA REPAIR     right   LEFT HEART CATH AND CORONARY ANGIOGRAPHY N/A 07/20/2022   Procedure: LEFT HEART CATH AND CORONARY ANGIOGRAPHY;  Surgeon: Tonny Bollman, MD;  Location: [facility] INVASIVE CV LAB;  Service: Cardiovascular;  Laterality: N/A;   REPLACEMENT ASCENDING AORTA N/A 09/07/2022   Procedure: REPLACEMENT ASCENDING AORTA;  Surgeon: Alleen Borne, MD;  Location: [facility] OR;  Service: Open Heart Surgery;  Laterality: N/A;  WITH CIRC ARREST   ROBOT ASSISTED LAPAROSCOPIC RADICAL PROSTATECTOMY N/A 07/23/2016   Procedure: XI ROBOTIC ASSISTED LAPAROSCOPIC RADICAL PROSTATECTOMY LEVEL 1;  Surgeon: Heloise Purpura, MD;  Location: [facility] ORS;  Service: Urology;  Laterality: N/A;   TEE WITHOUT CARDIOVERSION N/A 09/07/2022   Procedure: TRANSESOPHAGEAL ECHOCARDIOGRAM (TEE);  Surgeon: Alleen Borne, MD;  Location: [facility] OR;  Service: Open Heart Surgery;  Laterality: N/A;   THYROIDECTOMY, PARTIAL     [name] ([facility])    Family History  Problem Relation Age of Onset   Heart disease Paternal Grandmother     Prostate cancer Father        Recurrent x 3   Hyperlipidemia Father    Other Father        varicose veins   Hyperlipidemia Mother    Other Mother        varicose veins   Hypertension Maternal Grandmother    Hypertension Maternal Grandfather    Sudden death Brother 36   Colon cancer Neg Hx    Esophageal cancer Neg Hx    Rectal cancer Neg Hx    Stomach cancer Neg Hx     Social History   Social History Narrative   "[name]" is his preferred name   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  Review of Systems: Pertinent positives are listed above.  Otherwise, a full 14 point review of systems has been done in full and it is negative except where it is noted positive.  Objective:   There were no vitals taken for this visit. Ideal Body Weight:    Ideal Body Weight:   No results found.    02/16/2023   10:17 AM 02/15/2023   10:39 AM 12/12/2020    8:45 AM 06/21/2019    2:05 PM 12/16/2017    9:18 AM  Depression screen PHQ 2/9  Decreased Interest   0 0 1  Down, Depressed, Hopeless   0 0 1  PHQ - 2 Score   0 0 2  Altered sleeping   0  1  Tired, decreased energy   0  1  Change in appetite   0  1  Feeling bad or failure about yourself    0  0  Trouble concentrating   0  1  Moving slowly or fidgety/restless   0  1  Suicidal thoughts   0  0  PHQ-9 Score   0  7  Difficult doing work/chores   Not difficult at all       Information is confidential and restricted. Go to Review Flowsheets to unlock data.     GEN: well developed, well nourished, no acute distress Eyes: conjunctiva and lids normal, PERRLA, EOMI ENT: TM clear, nares clear, oral exam WNL Neck: supple, no lymphadenopathy, no thyromegaly, no JVD Pulm: clear  to auscultation and percussion, respiratory effort normal CV: regular rate and rhythm, S1-S2, no murmur, rub or gallop, no bruits, peripheral pulses normal and symmetric, no cyanosis, clubbing, edema or varicosities GI: soft, non-tender; no hepatosplenomegaly, masses; active bowel sounds all quadrants GU: deferred Lymph: no cervical, axillary or inguinal adenopathy MSK: gait normal, muscle tone and strength WNL, no joint swelling, effusions, discoloration, crepitus  SKIN: clear, good turgor, color WNL, no rashes, lesions, or ulcerations Neuro: normal mental status, normal strength, sensation, and motion Psych: alert; oriented to person, place and time, normally interactive and not anxious or depressed in appearance.  All labs reviewed with patient. Results for orders placed or performed during the hospital encounter of 02/17/23  HIV Antibody (routine testing w rflx)  Result Value Ref Range   HIV Screen 4th Generation wRfx Non Reactive Non Reactive  CBC  Result Value Ref Range   WBC 3.7 (L) 4.0 - 10.5 K/uL   RBC 4.01 (L) 4.22 - 5.81 MIL/uL   Hemoglobin 12.7 (L) 13.0 - 17.0 g/dL   HCT 16.1 (L) 09.6 - 04.5 %   MCV 94.3 80.0 - 100.0 fL   MCH 31.7 26.0 - 34.0 pg   MCHC 33.6 30.0 - 36.0 g/dL   RDW 40.9 81.1 - 91.4 %   Platelets 125 (L) 150 - 400 K/uL   nRBC 0.0 0.0 - 0.2 %  Creatinine, serum  Result Value Ref Range   Creatinine, Ser 1.14 0.61 - 1.24 mg/dL   GFR, Estimated >78 >29 mL/min  Vitamin B12  Result Value Ref Range   Vitamin B-12 652 180 - 914 pg/mL  TSH  Result Value Ref Range   TSH 6.941 (H) 0.350 - 4.500 uIU/mL  Comprehensive metabolic panel  Result Value Ref Range   Sodium 134 (L) 135 - 145 mmol/L   Potassium 3.4 (L) 3.5 - 5.1 mmol/L   Chloride 95 (L) 98 - 111 mmol/L   CO2 27 22 - 32 mmol/L   Glucose, Bld 75 70 - 99 mg/dL   BUN  11 6 - 20 mg/dL   Creatinine, Ser 9.62 0.61 - 1.24 mg/dL   Calcium 9.0 8.9 - 95.2 mg/dL   Total Protein 4.7 (L) 6.5 - 8.1 g/dL   Albumin  3.3 (L) 3.5 - 5.0 g/dL   AST 841 (H) 15 - 41 U/L   ALT 129 (H) 0 - 44 U/L   Alkaline Phosphatase 46 38 - 126 U/L   Total Bilirubin 2.0 (H) 0.3 - 1.2 mg/dL   GFR, Estimated >32 >44 mL/min   Anion gap 12 5 - 15  CBC  Result Value Ref Range   WBC 3.6 (L) 4.0 - 10.5 K/uL   RBC 3.98 (L) 4.22 - 5.81 MIL/uL   Hemoglobin 12.2 (L) 13.0 - 17.0 g/dL   HCT 01.0 (L) 27.2 - 53.6 %   MCV 92.7 80.0 - 100.0 fL   MCH 30.7 26.0 - 34.0 pg   MCHC 33.1 30.0 - 36.0 g/dL   RDW 64.4 03.4 - 74.2 %   Platelets 113 (L) 150 - 400 K/uL   nRBC 0.0 0.0 - 0.2 %  Magnesium  Result Value Ref Range   Magnesium 1.5 (L) 1.7 - 2.4 mg/dL  Phosphorus  Result Value Ref Range   Phosphorus 4.2 2.5 - 4.6 mg/dL  Folate  Result Value Ref Range   Folate 27.5 >5.9 ng/mL  Comprehensive metabolic panel  Result Value Ref Range   Sodium 137 135 - 145 mmol/L   Potassium 3.7 3.5 - 5.1 mmol/L   Chloride 101 98 - 111 mmol/L   CO2 26 22 - 32 mmol/L   Glucose, Bld 89 70 - 99 mg/dL   BUN 11 6 - 20 mg/dL   Creatinine, Ser 5.95 0.61 - 1.24 mg/dL   Calcium 8.8 (L) 8.9 - 10.3 mg/dL   Total Protein 5.8 (L) 6.5 - 8.1 g/dL   Albumin 3.1 (L) 3.5 - 5.0 g/dL   AST 638 (H) 15 - 41 U/L   ALT 169 (H) 0 - 44 U/L   Alkaline Phosphatase 46 38 - 126 U/L   Total Bilirubin 1.3 (H) 0.3 - 1.2 mg/dL   GFR, Estimated >75 >64 mL/min   Anion gap 10 5 - 15  CK  Result Value Ref Range   Total CK 169 49 - 397 U/L  CBC  Result Value Ref Range   WBC 3.4 (L) 4.0 - 10.5 K/uL   RBC 4.07 (L) 4.22 - 5.81 MIL/uL   Hemoglobin 12.5 (L) 13.0 - 17.0 g/dL   HCT 33.2 (L) 95.1 - 88.4 %   MCV 93.1 80.0 - 100.0 fL   MCH 30.7 26.0 - 34.0 pg   MCHC 33.0 30.0 - 36.0 g/dL   RDW 16.6 06.3 - 01.6 %   Platelets 132 (L) 150 - 400 K/uL   nRBC 0.0 0.0 - 0.2 %  Phosphorus  Result Value Ref Range   Phosphorus 3.8 2.5 - 4.6 mg/dL  Magnesium  Result Value Ref Range   Magnesium 1.9 1.7 - 2.4 mg/dL  T4, free  Result Value Ref Range   Free T4 0.96 0.61 - 1.12 ng/dL   Basic metabolic panel  Result Value Ref Range   Sodium 137 135 - 145 mmol/L   Potassium 3.8 3.5 - 5.1 mmol/L   Chloride 104 98 - 111 mmol/L   CO2 22 22 - 32 mmol/L   Glucose, Bld 108 (H) 70 - 99 mg/dL   BUN 6 6 - 20 mg/dL   Creatinine, Ser 0.10 0.61 - 1.24  mg/dL   Calcium 8.8 (L) 8.9 - 10.3 mg/dL   GFR, Estimated >16 >10 mL/min   Anion gap 11 5 - 15  CBC  Result Value Ref Range   WBC 2.9 (L) 4.0 - 10.5 K/uL   RBC 3.86 (L) 4.22 - 5.81 MIL/uL   Hemoglobin 12.3 (L) 13.0 - 17.0 g/dL   HCT 96.0 (L) 45.4 - 09.8 %   MCV 93.5 80.0 - 100.0 fL   MCH 31.9 26.0 - 34.0 pg   MCHC 34.1 30.0 - 36.0 g/dL   RDW 11.9 14.7 - 82.9 %   Platelets 147 (L) 150 - 400 K/uL   nRBC 0.0 0.0 - 0.2 %  Magnesium  Result Value Ref Range   Magnesium 1.8 1.7 - 2.4 mg/dL  Phosphorus  Result Value Ref Range   Phosphorus 3.7 2.5 - 4.6 mg/dL  Hepatic function panel  Result Value Ref Range   Total Protein 5.8 (L) 6.5 - 8.1 g/dL   Albumin 3.1 (L) 3.5 - 5.0 g/dL   AST 562 (H) 15 - 41 U/L   ALT 182 (H) 0 - 44 U/L   Alkaline Phosphatase 44 38 - 126 U/L   Total Bilirubin 1.3 (H) 0.3 - 1.2 mg/dL   Bilirubin, Direct 0.4 (H) 0.0 - 0.2 mg/dL   Indirect Bilirubin 0.9 0.3 - 0.9 mg/dL  Comprehensive metabolic panel  Result Value Ref Range   Sodium 134 (L) 135 - 145 mmol/L   Potassium 3.8 3.5 - 5.1 mmol/L   Chloride 102 98 - 111 mmol/L   CO2 23 22 - 32 mmol/L   Glucose, Bld 107 (H) 70 - 99 mg/dL   BUN 6 6 - 20 mg/dL   Creatinine, Ser 1.30 0.61 - 1.24 mg/dL   Calcium 8.6 (L) 8.9 - 10.3 mg/dL   Total Protein 5.8 (L) 6.5 - 8.1 g/dL   Albumin 3.0 (L) 3.5 - 5.0 g/dL   AST 865 (H) 15 - 41 U/L   ALT 183 (H) 0 - 44 U/L   Alkaline Phosphatase 49 38 - 126 U/L   Total Bilirubin 1.0 0.3 - 1.2 mg/dL   GFR, Estimated >78 >46 mL/min   Anion gap 9 5 - 15  CK  Result Value Ref Range   Total CK 63 49 - 397 U/L  Magnesium  Result Value Ref Range   Magnesium 1.7 1.7 - 2.4 mg/dL  Phosphorus  Result Value Ref Range    Phosphorus 3.7 2.5 - 4.6 mg/dL  CBC  Result Value Ref Range   WBC 2.9 (L) 4.0 - 10.5 K/uL   RBC 3.76 (L) 4.22 - 5.81 MIL/uL   Hemoglobin 11.7 (L) 13.0 - 17.0 g/dL   HCT 96.2 (L) 95.2 - 84.1 %   MCV 92.6 80.0 - 100.0 fL   MCH 31.1 26.0 - 34.0 pg   MCHC 33.6 30.0 - 36.0 g/dL   RDW 32.4 40.1 - 02.7 %   Platelets 174 150 - 400 K/uL   nRBC 0.0 0.0 - 0.2 %  Vitamin B12  Result Value Ref Range   Vitamin B-12 1,039 (H) 180 - 914 pg/mL  Folate  Result Value Ref Range   Folate 29.7 >5.9 ng/mL  Iron and TIBC  Result Value Ref Range   Iron 68 45 - 182 ug/dL   TIBC 253 664 - 403 ug/dL   Saturation Ratios 23 17.9 - 39.5 %   UIBC 230 ug/dL  Ferritin  Result Value Ref Range   Ferritin 233 24 -  336 ng/mL  Reticulocytes  Result Value Ref Range   Retic Ct Pct 2.4 0.4 - 3.1 %   RBC. 3.76 (L) 4.22 - 5.81 MIL/uL   Retic Count, Absolute 89.1 19.0 - 186.0 K/uL   Immature Retic Fract 10.0 2.3 - 15.9 %  Hepatitis panel, acute  Result Value Ref Range   Hepatitis B Surface Ag NON REACTIVE NON REACTIVE   HCV Ab NON REACTIVE NON REACTIVE   Hep A IgM NON REACTIVE NON REACTIVE   Hep B C IgM NON REACTIVE NON REACTIVE  Comprehensive metabolic panel  Result Value Ref Range   Sodium 131 (L) 135 - 145 mmol/L   Potassium 4.0 3.5 - 5.1 mmol/L   Chloride 99 98 - 111 mmol/L   CO2 23 22 - 32 mmol/L   Glucose, Bld 99 70 - 99 mg/dL   BUN 6 6 - 20 mg/dL   Creatinine, Ser 5.62 0.61 - 1.24 mg/dL   Calcium 8.7 (L) 8.9 - 10.3 mg/dL   Total Protein 5.7 (L) 6.5 - 8.1 g/dL   Albumin 3.0 (L) 3.5 - 5.0 g/dL   AST 130 (H) 15 - 41 U/L   ALT 189 (H) 0 - 44 U/L   Alkaline Phosphatase 58 38 - 126 U/L   Total Bilirubin 1.0 0.3 - 1.2 mg/dL   GFR, Estimated >86 >57 mL/min   Anion gap 9 5 - 15  Magnesium  Result Value Ref Range   Magnesium 1.7 1.7 - 2.4 mg/dL  CBC  Result Value Ref Range   WBC 3.5 (L) 4.0 - 10.5 K/uL   RBC 3.67 (L) 4.22 - 5.81 MIL/uL   Hemoglobin 11.8 (L) 13.0 - 17.0 g/dL   HCT 84.6 (L) 96.2 -  52.0 %   MCV 94.3 80.0 - 100.0 fL   MCH 32.2 26.0 - 34.0 pg   MCHC 34.1 30.0 - 36.0 g/dL   RDW 95.2 84.1 - 32.4 %   Platelets 177 150 - 400 K/uL   nRBC 0.0 0.0 - 0.2 %    Assessment and Plan:     ICD-10-CM   1. Healthcare maintenance  Z00.00       Health Maintenance Exam: The patient's preventative maintenance and recommended screening tests for an annual wellness exam were reviewed in full today. Brought up to date unless services declined.  Counselled on the importance of diet, exercise, and its role in overall health and mortality. The patient's FH and SH was reviewed, including their home life, tobacco status, and drug and alcohol status.  Follow-up in 1 year for physical exam or additional follow-up below.  Disposition: No follow-ups on file.  No orders of the defined types were placed in this encounter.  There are no discontinued medications. No orders of the defined types were placed in this encounter.   Signed,  Elpidio Galea. Pamelyn Bancroft, MD   Allergies as of 04/05/2023       Reactions   Vioxx [rofecoxib] Anaphylaxis, Other (See Comments)   Tolerates aspirin and naproxen.   Celebrex [celecoxib] Rash, Other (See Comments)   Tolerates aspirin and naproxen.   Codeine Rash, Other (See Comments)   Patient reports this happened as a child. He has tolerated Codeine since that time   Doxycycline Rash        Medication List        Accurate as of April 04, 2023  2:37 PM. If you have any questions, ask your nurse or doctor.  albuterol 108 (90 Base) MCG/ACT inhaler Commonly known as: VENTOLIN HFA INHALE 2 PUFFS INTO THE LUNGS EVERY 6 (SIX) HOURS AS NEEDED FOR WHEEZING. What changed:  how much to take how to take this when to take this reasons to take this   aspirin EC 81 MG tablet Take 81 mg by mouth daily. Swallow whole.   cetirizine 10 MG tablet Commonly known as: ZYRTEC Take 10 mg by mouth daily.   escitalopram 10 MG tablet Commonly  known as: LEXAPRO Take 1 tablet (10 mg total) by mouth in the morning.   escitalopram 10 MG tablet Commonly known as: LEXAPRO Take 1 tablet (10 mg total) by mouth daily at 8am   fluticasone 50 MCG/ACT nasal spray Commonly known as: FLONASE Place 2 sprays into both nostrils daily. What changed: how much to take   folic acid 1 MG tablet Commonly known as: FOLVITE Take 1 tablet (1 mg total) by mouth daily.   losartan 25 MG tablet Commonly known as: COZAAR Take 1 tablet (25 mg total) by mouth daily.   LUBRICATING EYE DROPS OP Apply 2 drops to eye daily as needed (dry eyes).   melatonin 3 MG Tabs tablet Take 1 tablet (3 mg total) by mouth daily at 9pm   metoprolol tartrate 25 MG tablet Commonly known as: LOPRESSOR Take 0.5 tablets (12.5 mg total) by mouth 2 (two) times daily.   montelukast 10 MG tablet Commonly known as: SINGULAIR Take 1 tablet (10 mg total) by mouth at bedtime. What changed: when to take this   multivitamin with minerals Tabs tablet Take 1 tablet by mouth daily.   naltrexone 50 MG tablet Commonly known as: DEPADE Take 1 tablet (50 mg total) by mouth daily at 8am   omeprazole 40 MG capsule Commonly known as: PRILOSEC Take 1 capsule (40 mg total) by mouth daily.   ondansetron 4 MG tablet Commonly known as: Zofran Take 1 tablet (4 mg total) by mouth every 8 (eight) hours as needed for nausea or vomiting.   Symbicort 160-4.5 MCG/ACT inhaler Generic drug: budesonide-formoterol Inhale 2 puffs into the lungs in the morning and at bedtime.   tadalafil 5 MG tablet Commonly known as: CIALIS Take 5 mg by mouth daily.   thiamine 100 MG tablet Commonly known as: Vitamin B-1 Take 1 tablet (100 mg total) by mouth daily.   traZODone 50 MG tablet Commonly known as: DESYREL Take 1 tablet (50 mg total) by mouth daily at 9:00 PM ** 24 hour max: 1 tablet **

## 2023-04-05 ENCOUNTER — Encounter: Payer: Self-pay | Admitting: Family Medicine

## 2023-04-05 ENCOUNTER — Ambulatory Visit: Payer: 59 | Admitting: Family Medicine

## 2023-04-05 ENCOUNTER — Other Ambulatory Visit: Payer: Self-pay | Admitting: Family Medicine

## 2023-04-05 ENCOUNTER — Telehealth: Payer: Self-pay | Admitting: Family Medicine

## 2023-04-05 ENCOUNTER — Other Ambulatory Visit: Payer: Self-pay

## 2023-04-05 VITALS — BP 82/47 | HR 58 | Temp 98.3°F | Ht 72.5 in | Wt 206.5 lb

## 2023-04-05 DIAGNOSIS — R7401 Elevation of levels of liver transaminase levels: Secondary | ICD-10-CM | POA: Diagnosis not present

## 2023-04-05 DIAGNOSIS — E785 Hyperlipidemia, unspecified: Secondary | ICD-10-CM | POA: Diagnosis not present

## 2023-04-05 DIAGNOSIS — F1020 Alcohol dependence, uncomplicated: Secondary | ICD-10-CM | POA: Diagnosis not present

## 2023-04-05 DIAGNOSIS — R748 Abnormal levels of other serum enzymes: Secondary | ICD-10-CM

## 2023-04-05 DIAGNOSIS — J45909 Unspecified asthma, uncomplicated: Secondary | ICD-10-CM | POA: Diagnosis not present

## 2023-04-05 DIAGNOSIS — Z0000 Encounter for general adult medical examination without abnormal findings: Secondary | ICD-10-CM | POA: Diagnosis not present

## 2023-04-05 DIAGNOSIS — M25562 Pain in left knee: Secondary | ICD-10-CM

## 2023-04-05 DIAGNOSIS — K219 Gastro-esophageal reflux disease without esophagitis: Secondary | ICD-10-CM | POA: Diagnosis not present

## 2023-04-05 DIAGNOSIS — G8929 Other chronic pain: Secondary | ICD-10-CM

## 2023-04-05 DIAGNOSIS — I10 Essential (primary) hypertension: Secondary | ICD-10-CM | POA: Diagnosis not present

## 2023-04-05 DIAGNOSIS — F419 Anxiety disorder, unspecified: Secondary | ICD-10-CM | POA: Diagnosis not present

## 2023-04-05 DIAGNOSIS — D508 Other iron deficiency anemias: Secondary | ICD-10-CM

## 2023-04-05 DIAGNOSIS — K760 Fatty (change of) liver, not elsewhere classified: Secondary | ICD-10-CM | POA: Diagnosis not present

## 2023-04-05 DIAGNOSIS — Z23 Encounter for immunization: Secondary | ICD-10-CM | POA: Diagnosis not present

## 2023-04-05 DIAGNOSIS — F329 Major depressive disorder, single episode, unspecified: Secondary | ICD-10-CM | POA: Diagnosis not present

## 2023-04-05 LAB — CBC WITH DIFFERENTIAL/PLATELET
Basophils Absolute: 0.1 10*3/uL (ref 0.0–0.1)
Basophils Relative: 1 % (ref 0.0–3.0)
Eosinophils Absolute: 0.4 10*3/uL (ref 0.0–0.7)
Eosinophils Relative: 7.7 % — ABNORMAL HIGH (ref 0.0–5.0)
HCT: 41.3 % (ref 39.0–52.0)
Hemoglobin: 13.8 g/dL (ref 13.0–17.0)
Lymphocytes Relative: 19.2 % (ref 12.0–46.0)
Lymphs Abs: 1.1 10*3/uL (ref 0.7–4.0)
MCHC: 33.4 g/dL (ref 30.0–36.0)
MCV: 93.2 fl (ref 78.0–100.0)
Monocytes Absolute: 0.5 10*3/uL (ref 0.1–1.0)
Monocytes Relative: 8.7 % (ref 3.0–12.0)
Neutro Abs: 3.6 10*3/uL (ref 1.4–7.7)
Neutrophils Relative %: 63.4 % (ref 43.0–77.0)
Platelets: 348 10*3/uL (ref 150.0–400.0)
RBC: 4.43 Mil/uL (ref 4.22–5.81)
RDW: 13.3 % (ref 11.5–15.5)
WBC: 5.6 10*3/uL (ref 4.0–10.5)

## 2023-04-05 LAB — HEPATIC FUNCTION PANEL
ALT: 25 U/L (ref 0–53)
AST: 25 U/L (ref 0–37)
Albumin: 4.2 g/dL (ref 3.5–5.2)
Alkaline Phosphatase: 55 U/L (ref 39–117)
Bilirubin, Direct: 0.2 mg/dL (ref 0.0–0.3)
Total Bilirubin: 0.7 mg/dL (ref 0.2–1.2)
Total Protein: 6.6 g/dL (ref 6.0–8.3)

## 2023-04-05 MED ORDER — TRIAMCINOLONE ACETONIDE 40 MG/ML IJ SUSP
40.0000 mg | Freq: Once | INTRAMUSCULAR | Status: AC
Start: 2023-04-05 — End: 2023-04-05
  Administered 2023-04-05: 40 mg via INTRA_ARTICULAR

## 2023-04-05 MED ORDER — PREDNISONE 20 MG PO TABS
ORAL_TABLET | ORAL | 0 refills | Status: AC
  Filled 2023-04-05: qty 15, 10d supply, fill #0

## 2023-04-05 MED ORDER — AZITHROMYCIN 250 MG PO TABS
ORAL_TABLET | ORAL | 0 refills | Status: AC
  Filled 2023-04-05: qty 6, 5d supply, fill #0

## 2023-04-05 MED ORDER — COMIRNATY 30 MCG/0.3ML IM SUSY
0.3000 mL | PREFILLED_SYRINGE | Freq: Once | INTRAMUSCULAR | 0 refills | Status: AC
  Filled 2023-04-05 (×2): qty 0.3, 1d supply, fill #0

## 2023-04-05 NOTE — Telephone Encounter (Signed)
Received fax from [employer] to [facility] for Austin Houston.  Forms placed in Dr. Patsy Lager office in box to complete.

## 2023-04-06 ENCOUNTER — Other Ambulatory Visit: Payer: Self-pay

## 2023-04-06 DIAGNOSIS — K760 Fatty (change of) liver, not elsewhere classified: Secondary | ICD-10-CM | POA: Diagnosis not present

## 2023-04-06 DIAGNOSIS — J45909 Unspecified asthma, uncomplicated: Secondary | ICD-10-CM | POA: Diagnosis not present

## 2023-04-06 DIAGNOSIS — F1020 Alcohol dependence, uncomplicated: Secondary | ICD-10-CM | POA: Diagnosis not present

## 2023-04-06 DIAGNOSIS — K219 Gastro-esophageal reflux disease without esophagitis: Secondary | ICD-10-CM | POA: Diagnosis not present

## 2023-04-06 DIAGNOSIS — E785 Hyperlipidemia, unspecified: Secondary | ICD-10-CM | POA: Diagnosis not present

## 2023-04-06 DIAGNOSIS — F329 Major depressive disorder, single episode, unspecified: Secondary | ICD-10-CM | POA: Diagnosis not present

## 2023-04-06 DIAGNOSIS — R7401 Elevation of levels of liver transaminase levels: Secondary | ICD-10-CM | POA: Diagnosis not present

## 2023-04-06 DIAGNOSIS — I10 Essential (primary) hypertension: Secondary | ICD-10-CM | POA: Diagnosis not present

## 2023-04-06 DIAGNOSIS — F419 Anxiety disorder, unspecified: Secondary | ICD-10-CM | POA: Diagnosis not present

## 2023-04-06 MED ORDER — NALTREXONE HCL 50 MG PO TABS
50.0000 mg | ORAL_TABLET | Freq: Every day | ORAL | 1 refills | Status: DC
  Filled 2023-04-06 – 2023-04-19 (×3): qty 90, 90d supply, fill #0
  Filled 2023-07-22: qty 90, 90d supply, fill #1

## 2023-04-06 MED ORDER — ESCITALOPRAM OXALATE 10 MG PO TABS
10.0000 mg | ORAL_TABLET | Freq: Every day | ORAL | 1 refills | Status: DC
  Filled 2023-04-06 – 2023-04-19 (×3): qty 90, 90d supply, fill #0
  Filled 2023-07-22: qty 90, 90d supply, fill #1

## 2023-04-06 NOTE — Telephone Encounter (Signed)
I think these were originally prescribed in the hospital.  Okay to refill?

## 2023-04-07 ENCOUNTER — Other Ambulatory Visit: Payer: Self-pay

## 2023-04-07 ENCOUNTER — Encounter: Payer: Self-pay | Admitting: *Deleted

## 2023-04-07 DIAGNOSIS — Z0279 Encounter for issue of other medical certificate: Secondary | ICD-10-CM | POA: Diagnosis not present

## 2023-04-07 NOTE — Telephone Encounter (Signed)
Spoke with Austin Houston. Yes to use alcohol dependence as primary diagnosis.   First date of medical leave:  02/15/2023 Return date: 11/12/20204

## 2023-04-07 NOTE — Telephone Encounter (Signed)
Can you call?  For his disability forms, I will need more info.  FIrst, I wanted to confirm that is ok to use alcohol dependence as the primary diagnosis.  I don't have any other reason.   When was the first day that he missed / first day of his medical leave? Does he have a specific date when he plans to return to work - hard to fill out without this?

## 2023-04-07 NOTE — Telephone Encounter (Signed)
Completed and placed in Donna's box.

## 2023-04-07 NOTE — Telephone Encounter (Addendum)
Left message for Austin Houston to return my call.   I also sent MyChart message to patient with the questions we need answered.

## 2023-04-07 NOTE — Telephone Encounter (Signed)
Completed forms faxed to [phone].

## 2023-04-08 DIAGNOSIS — F419 Anxiety disorder, unspecified: Secondary | ICD-10-CM | POA: Diagnosis not present

## 2023-04-08 DIAGNOSIS — K219 Gastro-esophageal reflux disease without esophagitis: Secondary | ICD-10-CM | POA: Diagnosis not present

## 2023-04-08 DIAGNOSIS — I10 Essential (primary) hypertension: Secondary | ICD-10-CM | POA: Diagnosis not present

## 2023-04-08 DIAGNOSIS — R7401 Elevation of levels of liver transaminase levels: Secondary | ICD-10-CM | POA: Diagnosis not present

## 2023-04-08 DIAGNOSIS — E785 Hyperlipidemia, unspecified: Secondary | ICD-10-CM | POA: Diagnosis not present

## 2023-04-08 DIAGNOSIS — F1020 Alcohol dependence, uncomplicated: Secondary | ICD-10-CM | POA: Diagnosis not present

## 2023-04-08 DIAGNOSIS — K760 Fatty (change of) liver, not elsewhere classified: Secondary | ICD-10-CM | POA: Diagnosis not present

## 2023-04-08 DIAGNOSIS — F329 Major depressive disorder, single episode, unspecified: Secondary | ICD-10-CM | POA: Diagnosis not present

## 2023-04-08 DIAGNOSIS — J45909 Unspecified asthma, uncomplicated: Secondary | ICD-10-CM | POA: Diagnosis not present

## 2023-04-09 ENCOUNTER — Other Ambulatory Visit: Payer: Self-pay | Admitting: Family Medicine

## 2023-04-09 ENCOUNTER — Other Ambulatory Visit: Payer: Self-pay

## 2023-04-09 DIAGNOSIS — K5792 Diverticulitis of intestine, part unspecified, without perforation or abscess without bleeding: Secondary | ICD-10-CM

## 2023-04-09 MED ORDER — ONDANSETRON HCL 4 MG PO TABS
4.0000 mg | ORAL_TABLET | Freq: Three times a day (TID) | ORAL | 0 refills | Status: DC | PRN
  Filled 2023-04-09 – 2023-04-19 (×2): qty 20, 7d supply, fill #0

## 2023-04-11 ENCOUNTER — Other Ambulatory Visit: Payer: Self-pay

## 2023-04-12 ENCOUNTER — Other Ambulatory Visit: Payer: Self-pay

## 2023-04-12 DIAGNOSIS — F419 Anxiety disorder, unspecified: Secondary | ICD-10-CM | POA: Diagnosis not present

## 2023-04-12 DIAGNOSIS — K219 Gastro-esophageal reflux disease without esophagitis: Secondary | ICD-10-CM | POA: Diagnosis not present

## 2023-04-12 DIAGNOSIS — R7401 Elevation of levels of liver transaminase levels: Secondary | ICD-10-CM | POA: Diagnosis not present

## 2023-04-12 DIAGNOSIS — I10 Essential (primary) hypertension: Secondary | ICD-10-CM | POA: Diagnosis not present

## 2023-04-12 DIAGNOSIS — K760 Fatty (change of) liver, not elsewhere classified: Secondary | ICD-10-CM | POA: Diagnosis not present

## 2023-04-12 DIAGNOSIS — F329 Major depressive disorder, single episode, unspecified: Secondary | ICD-10-CM | POA: Diagnosis not present

## 2023-04-12 DIAGNOSIS — F1020 Alcohol dependence, uncomplicated: Secondary | ICD-10-CM | POA: Diagnosis not present

## 2023-04-12 DIAGNOSIS — J45909 Unspecified asthma, uncomplicated: Secondary | ICD-10-CM | POA: Diagnosis not present

## 2023-04-12 DIAGNOSIS — E785 Hyperlipidemia, unspecified: Secondary | ICD-10-CM | POA: Diagnosis not present

## 2023-04-13 ENCOUNTER — Other Ambulatory Visit: Payer: Self-pay

## 2023-04-13 DIAGNOSIS — I10 Essential (primary) hypertension: Secondary | ICD-10-CM | POA: Diagnosis not present

## 2023-04-13 DIAGNOSIS — K760 Fatty (change of) liver, not elsewhere classified: Secondary | ICD-10-CM | POA: Diagnosis not present

## 2023-04-13 DIAGNOSIS — F419 Anxiety disorder, unspecified: Secondary | ICD-10-CM | POA: Diagnosis not present

## 2023-04-13 DIAGNOSIS — F329 Major depressive disorder, single episode, unspecified: Secondary | ICD-10-CM | POA: Diagnosis not present

## 2023-04-13 DIAGNOSIS — R7401 Elevation of levels of liver transaminase levels: Secondary | ICD-10-CM | POA: Diagnosis not present

## 2023-04-13 DIAGNOSIS — E785 Hyperlipidemia, unspecified: Secondary | ICD-10-CM | POA: Diagnosis not present

## 2023-04-13 DIAGNOSIS — K219 Gastro-esophageal reflux disease without esophagitis: Secondary | ICD-10-CM | POA: Diagnosis not present

## 2023-04-13 DIAGNOSIS — J45909 Unspecified asthma, uncomplicated: Secondary | ICD-10-CM | POA: Diagnosis not present

## 2023-04-13 DIAGNOSIS — F1020 Alcohol dependence, uncomplicated: Secondary | ICD-10-CM | POA: Diagnosis not present

## 2023-04-15 DIAGNOSIS — K760 Fatty (change of) liver, not elsewhere classified: Secondary | ICD-10-CM | POA: Diagnosis not present

## 2023-04-15 DIAGNOSIS — F419 Anxiety disorder, unspecified: Secondary | ICD-10-CM | POA: Diagnosis not present

## 2023-04-15 DIAGNOSIS — R7401 Elevation of levels of liver transaminase levels: Secondary | ICD-10-CM | POA: Diagnosis not present

## 2023-04-15 DIAGNOSIS — K219 Gastro-esophageal reflux disease without esophagitis: Secondary | ICD-10-CM | POA: Diagnosis not present

## 2023-04-15 DIAGNOSIS — J45909 Unspecified asthma, uncomplicated: Secondary | ICD-10-CM | POA: Diagnosis not present

## 2023-04-15 DIAGNOSIS — E785 Hyperlipidemia, unspecified: Secondary | ICD-10-CM | POA: Diagnosis not present

## 2023-04-15 DIAGNOSIS — F1020 Alcohol dependence, uncomplicated: Secondary | ICD-10-CM | POA: Diagnosis not present

## 2023-04-15 DIAGNOSIS — F329 Major depressive disorder, single episode, unspecified: Secondary | ICD-10-CM | POA: Diagnosis not present

## 2023-04-15 DIAGNOSIS — I10 Essential (primary) hypertension: Secondary | ICD-10-CM | POA: Diagnosis not present

## 2023-04-18 ENCOUNTER — Other Ambulatory Visit: Payer: Self-pay

## 2023-04-19 ENCOUNTER — Telehealth: Payer: Self-pay | Admitting: Cardiovascular Disease

## 2023-04-19 ENCOUNTER — Other Ambulatory Visit: Payer: Self-pay

## 2023-04-19 DIAGNOSIS — R7401 Elevation of levels of liver transaminase levels: Secondary | ICD-10-CM | POA: Diagnosis not present

## 2023-04-19 DIAGNOSIS — F419 Anxiety disorder, unspecified: Secondary | ICD-10-CM | POA: Diagnosis not present

## 2023-04-19 DIAGNOSIS — F1020 Alcohol dependence, uncomplicated: Secondary | ICD-10-CM | POA: Diagnosis not present

## 2023-04-19 DIAGNOSIS — J45909 Unspecified asthma, uncomplicated: Secondary | ICD-10-CM | POA: Diagnosis not present

## 2023-04-19 DIAGNOSIS — K760 Fatty (change of) liver, not elsewhere classified: Secondary | ICD-10-CM | POA: Diagnosis not present

## 2023-04-19 DIAGNOSIS — I10 Essential (primary) hypertension: Secondary | ICD-10-CM | POA: Diagnosis not present

## 2023-04-19 DIAGNOSIS — F329 Major depressive disorder, single episode, unspecified: Secondary | ICD-10-CM | POA: Diagnosis not present

## 2023-04-19 DIAGNOSIS — K219 Gastro-esophageal reflux disease without esophagitis: Secondary | ICD-10-CM | POA: Diagnosis not present

## 2023-04-19 DIAGNOSIS — E785 Hyperlipidemia, unspecified: Secondary | ICD-10-CM | POA: Diagnosis not present

## 2023-04-19 NOTE — Telephone Encounter (Signed)
Pre-operative Risk Assessment    Patient Name: Austin Houston  DOB: 03-Mar-1961 MRN: [id number]      Request for Surgical Clearance    Procedure:   Right shoulder Arthroscopy   Date of Surgery:  Clearance TBD                                 Surgeon:  Dr. Ave Filter  Surgeon's Group or Practice Name:  [facility]  Phone number:  [phone] Fax number:  [phone]   Type of Clearance Requested:   - Medical  - Pharmacy:  Hold "unclear"       Type of Anesthesia:   choice   Additional requests/questions:    SignedForest Gleason   04/19/2023, 3:33 PM

## 2023-04-19 NOTE — Telephone Encounter (Signed)
Printed both forms and placed in your box to review.

## 2023-04-20 ENCOUNTER — Telehealth: Payer: Self-pay | Admitting: *Deleted

## 2023-04-20 ENCOUNTER — Other Ambulatory Visit: Payer: Self-pay

## 2023-04-20 ENCOUNTER — Other Ambulatory Visit: Payer: Self-pay | Admitting: Family Medicine

## 2023-04-20 DIAGNOSIS — I10 Essential (primary) hypertension: Secondary | ICD-10-CM | POA: Diagnosis not present

## 2023-04-20 DIAGNOSIS — K760 Fatty (change of) liver, not elsewhere classified: Secondary | ICD-10-CM | POA: Diagnosis not present

## 2023-04-20 DIAGNOSIS — J45909 Unspecified asthma, uncomplicated: Secondary | ICD-10-CM | POA: Diagnosis not present

## 2023-04-20 DIAGNOSIS — R7401 Elevation of levels of liver transaminase levels: Secondary | ICD-10-CM | POA: Diagnosis not present

## 2023-04-20 DIAGNOSIS — K219 Gastro-esophageal reflux disease without esophagitis: Secondary | ICD-10-CM | POA: Diagnosis not present

## 2023-04-20 DIAGNOSIS — F419 Anxiety disorder, unspecified: Secondary | ICD-10-CM | POA: Diagnosis not present

## 2023-04-20 DIAGNOSIS — E785 Hyperlipidemia, unspecified: Secondary | ICD-10-CM | POA: Diagnosis not present

## 2023-04-20 DIAGNOSIS — F1020 Alcohol dependence, uncomplicated: Secondary | ICD-10-CM | POA: Diagnosis not present

## 2023-04-20 DIAGNOSIS — F329 Major depressive disorder, single episode, unspecified: Secondary | ICD-10-CM | POA: Diagnosis not present

## 2023-04-20 NOTE — Telephone Encounter (Signed)
Pt has been scheduled for a tele visit, 04/21/23 2:40.  Consent on file / medications reconciled.     Patient Consent for Virtual Visit        [name] has provided verbal consent on 04/20/2023 for a virtual visit (video or telephone).   CONSENT FOR VIRTUAL VISIT FOR:  Austin Houston  By participating in this virtual visit I agree to the following:  I hereby voluntarily request, consent and authorize [facility] and its employed or contracted physicians, physician assistants, nurse practitioners or other licensed health care professionals (the Practitioner), to provide me with telemedicine health care services (the "Services") as deemed necessary by the treating Practitioner. I acknowledge and consent to receive the Services by the Practitioner via telemedicine. I understand that the telemedicine visit will involve communicating with the Practitioner through live audiovisual communication technology and the disclosure of certain medical information by electronic transmission. I acknowledge that I have been given the opportunity to request an in-person assessment or other available alternative prior to the telemedicine visit and am voluntarily participating in the telemedicine visit.  I understand that I have the right to withhold or withdraw my consent to the use of telemedicine in the course of my care at any time, without affecting my right to future care or treatment, and that the Practitioner or I may terminate the telemedicine visit at any time. I understand that I have the right to inspect all information obtained and/or recorded in the course of the telemedicine visit and may receive copies of available information for a reasonable fee.  I understand that some of the potential risks of receiving the Services via telemedicine include:  Delay or interruption in medical evaluation due to technological equipment failure or disruption; Information transmitted may not be  sufficient (e.g. poor resolution of images) to allow for appropriate medical decision making by the Practitioner; and/or  In rare instances, security protocols could fail, causing a breach of personal health information.  Furthermore, I acknowledge that it is my responsibility to provide information about my medical history, conditions and care that is complete and accurate to the best of my ability. I acknowledge that Practitioner's advice, recommendations, and/or decision may be based on factors not within their control, such as incomplete or inaccurate data provided by me or distortions of diagnostic images or specimens that may result from electronic transmissions. I understand that the practice of medicine is not an exact science and that Practitioner makes no warranties or guarantees regarding treatment outcomes. I acknowledge that a copy of this consent can be made available to me via my patient portal [facility] MyChart), or I can request a printed copy by calling the office of [facility].    I understand that my insurance will be billed for this visit.   I have read or had this consent read to me. I understand the contents of this consent, which adequately explains the benefits and risks of the Services being provided via telemedicine.  I have been provided ample opportunity to ask questions regarding this consent and the Services and have had my questions answered to my satisfaction. I give my informed consent for the services to be provided through the use of telemedicine in my medical care

## 2023-04-20 NOTE — Telephone Encounter (Signed)
Name: Austin Houston  DOB: 1961/05/16  MRN: [id number]  Primary Cardiologist: Lorine Bears, MD   Preoperative team, please contact this patient and set up a phone call appointment for further preoperative risk assessment. Please obtain consent and complete medication review. Thank you for your help.Last seen by Dr. Kirke Corin 10/27/2022  I confirm that guidance regarding antiplatelet and oral anticoagulation therapy has been completed and, if necessary, noted below.  Per office protocol, if patient is without any new symptoms or concerns at the time of their virtual visit, he/she may hold ASA for 7 days prior to procedure. Please resume ASA as soon as possible postprocedure, at the discretion of the surgeon.    I also confirmed the patient resides in the state of [place]. As per [facility] Medical Board telemedicine laws, the patient must reside in the state in which the provider is licensed.   Joni Reining, NP 04/20/2023, 7:26 AM [facility]

## 2023-04-20 NOTE — Telephone Encounter (Signed)
LAST APPOINTMENT DATE: 04/05/23 - cpe    NEXT APPOINTMENT DATE: Visit date not found    LAST REFILL: 03/23/23  QTY: #30 no rf

## 2023-04-20 NOTE — Telephone Encounter (Signed)
Lvm for pt to call back to schedule tele phone visit for per op.

## 2023-04-20 NOTE — Telephone Encounter (Signed)
Pt has been scheduled for a tele visit, 04/21/23 2:40.  Consent on file / medications reconciled.

## 2023-04-21 ENCOUNTER — Ambulatory Visit: Payer: 59 | Attending: Cardiovascular Disease | Admitting: Nurse Practitioner

## 2023-04-21 ENCOUNTER — Other Ambulatory Visit: Payer: Self-pay

## 2023-04-21 DIAGNOSIS — Z01810 Encounter for preprocedural cardiovascular examination: Secondary | ICD-10-CM | POA: Diagnosis not present

## 2023-04-21 MED FILL — Trazodone HCl Tab 50 MG: ORAL | 90 days supply | Qty: 90 | Fill #0 | Status: AC

## 2023-04-21 NOTE — Progress Notes (Signed)
Virtual Visit via Telephone Note   Because of Austin Houston's co-morbid illnesses, he is at least at moderate risk for complications without adequate follow up.  This format is felt to be most appropriate for this patient at this time.  The patient did not have access to video technology/had technical difficulties with video requiring transitioning to audio format only (telephone).  All issues noted in this document were discussed and addressed.  No physical exam could be performed with this format.  Please refer to the patient's chart for his consent to telehealth for [facility].  Evaluation Performed:  Preoperative cardiovascular risk assessment _____________   Date:  04/21/2023   Patient ID:  Austin Houston, DOB [birth year], MRN [id number] Patient Location:  Home Provider location:   Office  Primary Care Provider:  Hannah Beat, MD Primary Cardiologist:  Lorine Bears, MD  Chief Complaint / Patient Profile   62 y.o. y/o male with a h/o postoperative atrial fibrillation not on anticoagulation, previous surgical repair of ascending aortic aneurysm, hypertension, hyperlipidemia, thyroid cancer, transaminitis, PTSD, ETOH abuse, prostate cancer, asthma, and GERD who is pending R shoulder arthroscopy with Dr. Ave Filter of [facility] and presents today for telephonic preoperative cardiovascular risk assessment.  History of Present Illness    Austin Houston is a 62 y.o. male who presents via audio/video conferencing for a telehealth visit today.  Pt was last seen in cardiology clinic on 10/27/2022 by Dr. Kirke Corin.  At that time ABDOURAHMANE STASI was doing well. The patient is now pending procedure as outlined above. Since his last visit, he has been stable from a cardiac standpoint.  E notes rare fleeting palpitations, denies any associated symptoms.  He notes that his PCP recently advised him to take metoprolol tartrate once daily in the setting of hypotension.   Additionally, amlodipine was discontinued. We discussed the benefit of twice daily dosing with metoprolol tartrate. He is agreeable to decrease his metoprolol tartrate to 12.5 mg twice daily.  He will continue to monitor his BP and heart rate.  He denies chest pain, dyspnea, pnd, orthopnea, n, v, dizziness, syncope, edema, weight gain, or early satiety. All other systems reviewed and are otherwise negative except as noted above.   Past Medical History    Past Medical History:  Diagnosis Date   Actinic keratosis    Allergic rhinitis due to pollen    Asthma    vs COPD- taking inhalers- as related to allergies only.   Basal cell carcinoma 03/26/2020   R lat deltoid - ED&C    Dysplastic nevus 03/07/2020   L costal infrapectoral - moderate   Dysplastic nevus 03/07/2020   R post flank above waistline - moderate   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HCC)    s/p partial thyroidectomy (follicular adenoma)-surgery only   GERD (gastroesophageal reflux disease)    Hearing impaired person, bilateral    hearing aida bilateral"high frequency loss   Hyperlipidemia    Hypertension    Prostate cancer (HCC) 01/08/2016   PTSD (post-traumatic stress disorder)    s/p multiple active deployments for [employer], no medications taken currently   Past Surgical History:  Procedure Laterality Date   COLONOSCOPY W/ POLYPECTOMY     age 70 "adenoma"   INGUINAL HERNIA REPAIR     right   LEFT HEART CATH AND CORONARY ANGIOGRAPHY N/A 07/20/2022   Procedure: LEFT HEART CATH AND CORONARY ANGIOGRAPHY;  Surgeon: Tonny Bollman, MD;  Location: [facility] INVASIVE CV LAB;  Service: Cardiovascular;  Laterality: N/A;   REPLACEMENT ASCENDING AORTA N/A 09/07/2022   Procedure: REPLACEMENT ASCENDING AORTA;  Surgeon: Alleen Borne, MD;  Location: [facility] OR;  Service: Open Heart Surgery;  Laterality: N/A;  WITH CIRC ARREST   ROBOT ASSISTED LAPAROSCOPIC RADICAL PROSTATECTOMY N/A 07/23/2016   Procedure: XI ROBOTIC ASSISTED LAPAROSCOPIC RADICAL  PROSTATECTOMY LEVEL 1;  Surgeon: Heloise Purpura, MD;  Location: [facility] ORS;  Service: Urology;  Laterality: N/A;   TEE WITHOUT CARDIOVERSION N/A 09/07/2022   Procedure: TRANSESOPHAGEAL ECHOCARDIOGRAM (TEE);  Surgeon: Alleen Borne, MD;  Location: [facility] OR;  Service: Open Heart Surgery;  Laterality: N/A;   THYROIDECTOMY, PARTIAL     [name])    Allergies  Allergies  Allergen Reactions   Vioxx [Rofecoxib] Anaphylaxis and Other (See Comments)    Tolerates aspirin and naproxen.   Celebrex [Celecoxib] Rash and Other (See Comments)    Tolerates aspirin and naproxen.   Codeine Rash and Other (See Comments)    Patient reports this happened as a child. He has tolerated Codeine since that time   Doxycycline Rash    Home Medications    Prior to Admission medications   Medication Sig Start Date End Date Taking? Authorizing Provider  albuterol (VENTOLIN HFA) 108 (90 Base) MCG/ACT inhaler INHALE 2 PUFFS INTO THE LUNGS EVERY 6 (SIX) HOURS AS NEEDED FOR WHEEZING. 05/20/22 05/20/23  Copland, Karleen Hampshire, MD  aspirin EC 81 MG tablet Take 81 mg by mouth daily. Swallow whole.    [provider]  budesonide-formoterol (SYMBICORT) 160-4.5 MCG/ACT inhaler Inhale 2 puffs into the lungs in the morning and at bedtime. 08/25/21   Mannam, Colbert Coyer, MD  Carboxymethylcellul-Glycerin (LUBRICATING EYE DROPS OP) Apply 2 drops to eye daily as needed (dry eyes).    [provider]  cetirizine (ZYRTEC) 10 MG tablet Take 10 mg by mouth daily.    [provider]  escitalopram (LEXAPRO) 10 MG tablet Take 1 tablet (10 mg total) by mouth daily at 8am 04/06/23   Copland, Karleen Hampshire, MD  fluticasone (FLONASE) 50 MCG/ACT nasal spray Place 2 sprays into both nostrils daily. Patient taking differently: Place 1 spray into both nostrils daily. 10/30/20   Junie Spencer, FNP  losartan (COZAAR) 25 MG tablet Take 1 tablet (25 mg total) by mouth daily. 02/22/23   Almon Hercules, MD  Magnesium 400 MG TABS Take 1 tablet by  mouth daily.    [provider]  melatonin 3 MG TABS tablet Take 1 tablet (3 mg total) by mouth daily at 9pm 03/23/23     metoprolol tartrate (LOPRESSOR) 25 MG tablet Take 25 mg by mouth daily.    [provider]  montelukast (SINGULAIR) 10 MG tablet Take 1 tablet (10 mg total) by mouth at bedtime. Patient taking differently: Take 10 mg by mouth daily. 01/19/23   Chilton Greathouse, MD  Multiple Vitamin (MULTIVITAMIN WITH MINERALS) TABS tablet Take 1 tablet by mouth daily.    [provider]  naltrexone (DEPADE) 50 MG tablet Take 1 tablet (50 mg total) by mouth daily at 8am 04/06/23   Copland, Karleen Hampshire, MD  omeprazole (PRILOSEC) 40 MG capsule Take 1 capsule (40 mg total) by mouth daily. 04/19/22   Copland, Karleen Hampshire, MD  ondansetron (ZOFRAN) 4 MG tablet Take 1 tablet (4 mg total) by mouth every 8 (eight) hours as needed for nausea or vomiting. 04/09/23   Copland, Karleen Hampshire, MD  tadalafil (CIALIS) 5 MG tablet Take 5 mg by mouth daily. 01/21/23   [provider]  traZODone (DESYREL)  50 MG tablet Take 1 tablet (50 mg total) by mouth daily at 9:00 PM ** 24 hour max: 1 tablet ** 04/21/23   Copland, Karleen Hampshire, MD  atorvastatin (LIPITOR) 20 MG tablet Take 1 tablet (20 mg total) by mouth daily. 09/04/22 09/04/22  Nahser, Deloris Ping, MD  beclomethasone (QVAR) 80 MCG/ACT inhaler Inhale 2 puffs into the lungs daily. 04/27/18 12/19/19  [name], MD    Physical Exam    Vital Signs:  Leverne Humbles does not have vital signs available for review today.  Given telephonic nature of communication, physical exam is limited. AAOx3. NAD. Normal affect.  Speech and respirations are unlabored.  Accessory Clinical Findings    None  Assessment & Plan    1.  Preoperative Cardiovascular Risk Assessment:  According to the Revised Cardiac Risk Index (RCRI), his Perioperative Risk of Major Cardiac Event is (%): 0.4. His Functional Capacity in METs is: 8.97 according to the [facility] Activity  Status Index (DASI).Therefore, based on ACC/AHA guidelines, patient would be at acceptable risk for the planned procedure without further cardiovascular testing.  The patient was advised that if he develops new symptoms prior to surgery to contact our office to arrange for a follow-up visit, and he verbalized understanding.  Per office protocol, he may hold Aspirin for 5-7 days prior to procedure. Please resume Aspirin as soon as possible postprocedure, at the discretion of the surgeon.   A copy of this note will be routed to requesting surgeon.  Time:   Today, I have spent 7 minutes with the patient with telehealth technology discussing medical history, symptoms, and management plan.     Joylene Grapes, NP  04/21/2023, 2:57 PM

## 2023-04-23 ENCOUNTER — Other Ambulatory Visit: Payer: Self-pay

## 2023-04-23 ENCOUNTER — Telehealth: Payer: Self-pay | Admitting: Pulmonary Disease

## 2023-04-23 NOTE — Telephone Encounter (Signed)
Office is calling for update. Please advise 

## 2023-04-23 NOTE — Telephone Encounter (Signed)
They are asking for surgical clearance for Mr. Soltero

## 2023-04-23 NOTE — Telephone Encounter (Signed)
Will forward to pre op APP to review if the pt has been cleared.

## 2023-04-26 NOTE — Telephone Encounter (Signed)
Fax received from Dr. Jones Broom with [facility] to perform a Rt shoulder arthroscopy on patient.  Patient needs surgery clearance. Surgery is TBD. Patient was seen on 11/11/21 . Office protocol is a risk assessment can be sent to surgeon if patient has been seen in 60 days or less.   This pt needs appt. I called him to schedule visit for risk assessment, and there was no answer- LMTCB.

## 2023-05-10 ENCOUNTER — Telehealth: Payer: Self-pay | Admitting: Pulmonary Disease

## 2023-05-10 NOTE — Telephone Encounter (Signed)
Patient provided update that surgery is tentatively scheduled for 11/12. Upping status to urgent.

## 2023-05-10 NOTE — Telephone Encounter (Signed)
Patient contacted front staff via [employer]. He is needing surgical clearance for his shoulder. Both Dr Isaiah Serge and all Apps are booked until 07/05/23. LOV 11/11/21. Dr Isaiah Serge, could he do a virtual visit for this appointment or are you willing to work him in to you schedule sooner?

## 2023-05-12 NOTE — Telephone Encounter (Signed)
I can open clinic on 11/8 AM to see some of recalls and pending consults. Please block 10-10:30 AM for a meeting on that day. Thanks

## 2023-05-13 NOTE — Telephone Encounter (Signed)
Fredric Mare- can you please open the schedule and put this pt on so we can get him cleared for surgery- see Mannam's msg below, thanks!

## 2023-05-13 NOTE — Telephone Encounter (Signed)
Patient is scheduled 05/21/2023 at 11am in person with Dr. Isaiah Serge. Nothing further needed.

## 2023-05-21 ENCOUNTER — Encounter: Payer: Self-pay | Admitting: Pulmonary Disease

## 2023-05-21 ENCOUNTER — Other Ambulatory Visit: Payer: Self-pay | Admitting: Orthopedic Surgery

## 2023-05-21 ENCOUNTER — Ambulatory Visit (INDEPENDENT_AMBULATORY_CARE_PROVIDER_SITE_OTHER): Payer: Worker's Compensation | Admitting: Pulmonary Disease

## 2023-05-21 VITALS — BP 121/78 | HR 74 | Temp 97.1°F | Ht 74.0 in | Wt 202.0 lb

## 2023-05-21 DIAGNOSIS — J4540 Moderate persistent asthma, uncomplicated: Secondary | ICD-10-CM

## 2023-05-21 NOTE — Patient Instructions (Signed)
VISIT SUMMARY:  You came in today for a preoperative clearance for your upcoming rotator cuff surgery. You mentioned having a cold a few weeks ago, which led to a persistent cough, but you are feeling much better now. Your asthma is well-controlled with your current medications, and you have not needed your rescue inhaler this week. You also had an aortic aneurysm repair earlier this year with no complications.  YOUR PLAN:  -MODERATE PERSISTENT ASTHMA: Moderate persistent asthma is a condition where the airways in your lungs are inflamed and narrowed, causing difficulty in breathing. You are currently using Symbicort twice daily and Singulair daily, which are helping to control your symptoms. You may consider reducing Symbicort to once daily or as needed when your symptoms improve.  -PREOPERATIVE ASSESSMENT FOR ROTATOR CUFF SURGERY: You are scheduled for rotator cuff surgery, and your asthma is well-controlled with no recent pulmonary issues. You have been given clearance for the surgery, and a note has been sent to your orthopedic surgeon.  INSTRUCTIONS:  Please follow up in 1 year for a routine check-up.

## 2023-05-21 NOTE — Progress Notes (Signed)
Austin Houston    [id number]    06/23/1961  Primary Care Physician:Copland, Karleen Hampshire, MD  Referring Physician: Hannah Beat, MD [address] [place],  [place] [address]  Chief complaint: Follow-up for asthma  HPI: 62 year old with history of asthma He was initially on Foradil and then Qvar.  This was changed to Symbicort in 2021 He developed COVID-19 in June 2022 followed by flu infection around Thanksgiving of 2022.  Reports worsening asthma control since these infections. He was previously using Symbicort intermittently but now using 2 puffs twice daily.  He also needs to use his albuterol rescue inhaler up to 3 times daily  Has seasonal allergies for which she is taking Zyrtec over-the-counter.  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  No ongoing exposures.  Denies any mold, hot tub, Jacuzzi.  No feather pillows or comforters Smoking history: Never smoker.  No vaping Travel history: No significant travel history Relevant family history: No family history of lung disease  Interim history: Discussed the use of AI scribe software for clinical note transcription with the patient, who gave verbal consent to proceed.  The patient, with a history of moderate persistent asthma and aortic aneurysm repair, presents for preoperative clearance for an upcoming rotator cuff surgery. He reports a recent cold about three to four weeks ago, which resulted in a persistent nonproductive cough for about ten days. Currently, he only coughs occasionally and feels 'pretty good.' He has been using his Symbicort inhaler twice in the morning and twice in the evening, and has not needed his rescue inhaler this week. He hopes to wean down on the Symbicort once the season changes. He has been taking Singulair  daily.  In addition, he underwent an ascending aortic aneurysm repair in February of this year. He reports no complications from the procedure and was discharged from the hospital within a week. He had no pulmonary issues postoperatively.  Hospital records reviewed   Outpatient Encounter Medications as of 05/21/2023  Medication Sig   albuterol (VENTOLIN HFA) 108 (90 Base) MCG/ACT inhaler INHALE 2 PUFFS INTO THE LUNGS EVERY 6 (SIX) HOURS AS NEEDED FOR WHEEZING.   aspirin EC 81 MG tablet Take 81 mg by mouth daily. Swallow whole.   budesonide-formoterol (SYMBICORT) 160-4.5 MCG/ACT inhaler Inhale 2 puffs into the lungs in the morning and at bedtime.   Carboxymethylcellul-Glycerin (LUBRICATING EYE DROPS OP) Apply 2 drops to eye daily as needed (dry eyes).   cetirizine (ZYRTEC) 10 MG tablet Take 10 mg by mouth daily.   escitalopram (LEXAPRO) 10 MG tablet Take 1 tablet (10 mg total) by mouth daily at 8am   fluticasone (FLONASE) 50 MCG/ACT nasal spray Place 2 sprays into both nostrils daily. (Patient taking differently: Place 1 spray into both nostrils daily.)   losartan (COZAAR) 25 MG tablet Take 1 tablet (25 mg total) by mouth daily.   Magnesium 400 MG TABS Take 1 tablet by mouth daily.   melatonin 3 MG TABS tablet Take 1 tablet (3 mg total) by mouth daily at 9pm   metoprolol tartrate (LOPRESSOR) 25 MG tablet Take 25 mg by mouth daily.   montelukast (SINGULAIR) 10 MG tablet Take 1 tablet (10 mg total) by mouth at bedtime. (Patient taking differently: Take 10 mg by mouth daily.)   Multiple Vitamin (MULTIVITAMIN  WITH MINERALS) TABS tablet Take 1 tablet by mouth daily.   naltrexone (DEPADE) 50 MG tablet Take 1 tablet (50 mg total) by mouth daily at 8am   omeprazole (PRILOSEC) 40 MG capsule Take 1 capsule (40 mg total) by mouth daily.   ondansetron (ZOFRAN) 4 MG tablet Take 1 tablet (4 mg total) by mouth every 8 (eight) hours as needed for nausea or vomiting.   tadalafil (CIALIS) 5 MG tablet Take 5 mg by  mouth daily.   traZODone (DESYREL) 50 MG tablet Take 1 tablet (50 mg total) by mouth daily at 9:00 PM ** 24 hour max: 1 tablet **   [DISCONTINUED] atorvastatin (LIPITOR) 20 MG tablet Take 1 tablet (20 mg total) by mouth daily.   [DISCONTINUED] beclomethasone (QVAR) 80 MCG/ACT inhaler Inhale 2 puffs into the lungs daily.   No facility-administered encounter medications on file as of 05/21/2023.    Physical Exam: Blood pressure 134/80, pulse 71, temperature 97.9 F (36.6 C), temperature source Oral, height 6\' 2"  (1.88 m), weight 208 lb 9.6 oz (94.6 kg), SpO2 99 %. Gen:      No acute distress HEENT:  EOMI, sclera anicteric Neck:     No masses; no thyromegaly Lungs:    Clear to auscultation bilaterally; normal respiratory effort CV:         Regular rate and rhythm; no murmurs Abd:      + bowel sounds; soft, non-tender; no palpable masses, no distension Ext:    No edema; adequate peripheral perfusion Skin:      Warm and dry; no rash Neuro: alert and oriented x 3 Psych: normal mood and affect   Data Reviewed: Imaging: CT high-resolution 01/07/2017-no evidence of interstitial lung disease, thoracic aortic aneurysm, two-vessel coronary atherosclerosis, hepatic steatosis.    Chest x-ray 08/25/2021-coarsened interstitial changes.  CT chest 04/28/2022-ascending aortic aneurysm measuring 5 cm, hepatic steatosis.  Visualized lungs are clear  Chest x-ray 10/07/2022-trace left effusion, no active cardiopulmonary disease. I have reviewed the images personally.  PFTs: 04/06/2017 FVC 5.44 [100%], FEV1 4.45 [106%], F/F 82, TLC 8.57 [131%], DLCO 42.13 [115%]  11/11/2021 FVC 5.25 [98%], FEV1 4.17 [103%], F/F 79, TLC 7.93 [104%], DLCO 45.62 [149%]  ACT score  08/25/2021- 15 11/11/2021- 23  Labs: CBC 12/12/2020-WBC 3.1, eos 7.3%, absolute eosinophilic count 226 CBC 08/25/2021-WBC 4.4, eos 4.5%, absolute eosinophil count 198  IgE 08/25/2021-1689  Assessment:  Moderate Persistent Asthma Recent upper  respiratory infection with increased cough, but no wheezing or need for rescue inhaler. Currently using Symbicort twice daily and Singulair daily. -Continue current regimen. -Consider reducing Symbicort to once daily or as needed when symptoms improve.  Preoperative Assessment for Rotator Cuff Surgery Asthma well-controlled. No recent pulmonary issues. Prior successful aortic aneurysm repair with no pulmonary complications. -Clearance given for surgery. -Note sent to orthopedic surgeon.  Overall, I recommend proceeding with the surgery if the risk for respiratory complications are outweighed by the potential benefits. This will need to be discussed between the patient and surgeon.  To reduce risks of respiratory complications, I recommend: --Pre- and post-operative incentive spirometry performed frequently while awake --Avoiding use of pancuronium during anesthesia.  I have discussed the risk factors and recommendations above with the patient.   Follow-up in 1 year.    Plan/Recommendations: Continue Symbicort, Singulair Follow-up in 1 year  Chilton Greathouse MD [facility] 05/21/2023, 11:15 AM  CC: Hannah Beat, MD

## 2023-05-24 ENCOUNTER — Encounter (HOSPITAL_BASED_OUTPATIENT_CLINIC_OR_DEPARTMENT_OTHER): Payer: Self-pay | Admitting: Orthopedic Surgery

## 2023-05-24 ENCOUNTER — Other Ambulatory Visit: Payer: Self-pay

## 2023-05-24 ENCOUNTER — Telehealth: Payer: Self-pay | Admitting: Family Medicine

## 2023-05-24 NOTE — Progress Notes (Signed)
   05/24/23 1004  Pre-op Phone Call  Surgery Date Verified 05/27/23  Arrival Time Verified 1130  Surgery Location Verified [facility] [place]  Medical History Reviewed Yes  Is the patient taking a GLP-1 receptor agonist? No  Do you have a history of heart problems? Yes  Cardiologist Name Lorine Bears, MD  Have you ever had tests on your heart? Yes  What cardiac tests were performed? EKG;Echo;Cardiac Cath  Results viewable: [facility] Media Tab  Does patient have other implanted devices? No  Patient Teaching Enhanced Recovery;Pre / Post Procedure  Patient educated about smoking cessation 24 hours prior to surgery. N/A Non-Smoker  Patient verbalizes understanding of bowel prep? N/A  THA/TKA patients only:  By your surgery date, will you have been taking narcotics for 90 days or greater? No  Med Rec Completed Yes  Take the Following Meds the Morning of Surgery Take lexapro, omeprazole, metoprolol and asthma medications  Recent  Lab Work, EKG, CXR? No  NPO (Including gum & candy) After midnight  Allowed clear liquids Water;Black Coffee Only (no creamer, milk or cream including half and half);Gatorade  (diabetics please choose diet or no sugar options)  Patient instructed to stop clear liquids including Carb loading drink at: 1030  Stop Solids, Milk, Candy, and Gum STARTING AT MIDNIGHT  Responsible adult to drive and be with you for 24 hours? Yes  Name & Phone Number for Ride/Caregiver Wife [name]  No Jewelry, money, nail polish or make-up.  No lotions, powders, perfumes. No shaving  48 hrs. prior to surgery. Yes  Contacts, Dentures & Glasses Will Have to be Removed Before OR. Yes  Please bring your ID and Insurance Card the morning of your surgery. (Surgery Centers Only) Yes  Bring any papers or x-rays with you that your surgeon gave you. Yes  Instructed to contact the location of procedure/ provider if they or anyone in their household develops symptoms or tests positive for COVID-19, has close contact with  someone who tests positive for COVID, or has known exposure to any contagious illness. Yes  Call this number the morning of surgery  with any problems that may cancel your surgery. [phone]  Covid-19 Assessment  Have you had a positive COVID-19 test within the previous 90 days? No  COVID Testing Guidance Proceed with the additional questions.  Patient's surgery required a COVID-19 test (cardiothoracic, complex ENT, and bronchoscopies/ EBUS) No  Have you been unmasked and in close contact with anyone with COVID-19 or COVID-19 symptoms within the past 10 days? No  Do you or anyone in your household currently have any COVID-19 symptoms? No   Cards and pulmonary clearance in chart.

## 2023-05-24 NOTE — Telephone Encounter (Signed)
Austin Houston, can you call and check with [name]?  I got a couple of more sets of questions about disability.  One is for me to complete.  There is also a set of paperwork for a behavioral health or mental health provider to complete?  Does he have a mental health provider other than me?  I have never done similar paperwork, but if he is only seeing me, then I will complete it for him.

## 2023-05-25 NOTE — Telephone Encounter (Signed)
Spoke with Mr. Posso.  He will stop by and pick up paperwork that needs to be filled out by mental health provider and take them to [facility].  Forms taken up front for patient to pick up.

## 2023-05-25 NOTE — Telephone Encounter (Signed)
Left message for Austin Houston to return call to office in regards to disability PPW.

## 2023-05-26 MED FILL — Montelukast Sodium Tab 10 MG (Base Equiv): ORAL | 30 days supply | Qty: 30 | Fill #2 | Status: AC

## 2023-05-26 NOTE — Progress Notes (Signed)
Attempted to obtain medical history via telephone, unable to reach at this time. HIPAA compliant voicemail message left requesting return call to pre surgical testing department. 

## 2023-05-26 NOTE — Patient Instructions (Signed)
SURGICAL WAITING ROOM VISITATION  Patients having surgery or a procedure may have no more than 2 support people in the waiting area - these visitors may rotate.    Children under the age of 67 must have an adult with them who is not the patient.  Due to an increase in RSV and influenza rates and associated hospitalizations, children ages 76 and under may not visit patients in [facility] hospitals.  If the patient needs to stay at the hospital during part of their recovery, the visitor guidelines for inpatient rooms apply. Pre-op nurse will coordinate an appropriate time for 1 support person to accompany patient in pre-op.  This support person may not rotate.    Please refer to the [facility] website for the visitor guidelines for Inpatients (after your surgery is over and you are in a regular room).       Your procedure is scheduled on: Tomorrow, Nov. 14, 2024   Report to [facility] Main Entrance    Report to admitting at 7:10 AM   Call this number if you have problems the morning of surgery [phone]   Do not eat food :After Midnight.   After Midnight you may have the following liquids until 6:25 AM DAY OF SURGERY  Water Non-Citrus Juices (without pulp, NO RED-Apple, White grape, White cranberry) Black Coffee (NO MILK/CREAM OR CREAMERS, sugar ok)  Clear Tea (NO MILK/CREAM OR CREAMERS, sugar ok) regular and decaf                             Plain Jell-O (NO RED)                                           Fruit ices (not with fruit pulp, NO RED)                                     Popsicles (NO RED)                                                               Sports drinks like Gatorade (NO RED)                  The day of surgery:  Drink ONE (1) Pre-Surgery Clear Ensure at 6:25 AM the morning of surgery. Drink in one sitting. Do not sip.  This drink was given to you during your hospital  pre-op appointment visit. Nothing else to drink after completing the   Pre-Surgery Clear Ensure.          If you have questions, please contact your surgeon's office.   FOLLOW BOWEL PREP AND ANY ADDITIONAL PRE OP INSTRUCTIONS YOU RECEIVED FROM YOUR SURGEON'S OFFICE!!!     Oral Hygiene is also important to reduce your risk of infection.                                    Remember - BRUSH YOUR TEETH THE MORNING OF SURGERY  WITH YOUR REGULAR TOOTHPASTE  DENTURES WILL BE REMOVED PRIOR TO SURGERY PLEASE DO NOT APPLY "Poly grip" OR ADHESIVES!!!   Do NOT smoke after Midnight   Stop all vitamins and herbal supplements 7 days before surgery.   Take these medicines the morning of surgery with A SIP OF WATER:  Escitalopram, Omeprazole, Metoprolol, Cetirizine, Use Flonase and Symbicort per normal morning routine  DO NOT TAKE Cialis 24 hours prior to surgery                              You may not have any metal on your body including jewelry, and body piercing             Do not wear  lotions, powders, perfumes/cologne, or deodorant                Men may shave face and neck.   Do not bring valuables to the hospital. [facility] IS NOT             RESPONSIBLE   FOR VALUABLES.   Contacts, glasses, dentures or bridgework may not be worn into surgery.   Bring small overnight bag day of surgery.   DO NOT BRING YOUR HOME MEDICATIONS TO THE HOSPITAL. PHARMACY WILL DISPENSE MEDICATIONS LISTED ON YOUR MEDICATION LIST TO YOU DURING YOUR ADMISSION IN THE HOSPITAL!    Patients discharged on the day of surgery will not be allowed to drive home.  Someone NEEDS to stay with you for the first 24 hours after anesthesia.   Special Instructions: Bring a copy of your healthcare power of attorney and living will documents the day of surgery if you haven't scanned them before.              Please read over the following fact sheets you were given: IF YOU HAVE QUESTIONS ABOUT YOUR PRE-OP INSTRUCTIONS PLEASE CALL [phone]   If you received a COVID test during your  pre-op visit  it is requested that you wear a mask when out in public, stay away from anyone that may not be feeling well and notify your surgeon if you develop symptoms. If you test positive for Covid or have been in contact with anyone that has tested positive in the last 10 days please notify you surgeon.    [facility] - Preparing for Surgery Before surgery, you can play an important role.  Because skin is not sterile, your skin needs to be as free of germs as possible.  You can reduce the number of germs on your skin by washing with CHG (chlorahexidine gluconate) soap before surgery.  CHG is an antiseptic cleaner which kills germs and bonds with the skin to continue killing germs even after washing. Please DO NOT use if you have an allergy to CHG or antibacterial soaps.  If your skin becomes reddened/irritated stop using the CHG and inform your nurse when you arrive at Short Stay. Do not shave (including legs and underarms) for at least 48 hours prior to the first CHG shower.  You may shave your face/neck.  Please follow these instructions carefully:  1.  Shower with CHG Soap the night before surgery and the  morning of surgery.  2.  If you choose to wash your hair, wash your hair first as usual with your normal  shampoo.  3.  After you shampoo, rinse your hair and body thoroughly to remove the shampoo.  4.  Use CHG as you would any other liquid soap.  You can apply chg directly to the skin and wash.  Gently with a scrungie or clean washcloth.  5.  Apply the CHG Soap to your body ONLY FROM THE NECK DOWN.   Do   not use on face/ open                           Wound or open sores. Avoid contact with eyes, ears mouth and   genitals (private parts).                       Wash face,  Genitals (private parts) with your normal soap.             6.  Wash thoroughly, paying special attention to the area where your    surgery  will be performed.  7.  Thoroughly rinse your body  with warm water from the neck down.  8.  DO NOT shower/wash with your normal soap after using and rinsing off the CHG Soap.                9.  Pat yourself dry with a clean towel.            10.  Wear clean pajamas.            11.  Place clean sheets on your bed the night of your first shower and do not  sleep with pets. Day of Surgery : Do not apply any lotions/deodorants the morning of surgery.  Please wear clean clothes to the hospital/surgery center.  FAILURE TO FOLLOW THESE INSTRUCTIONS MAY RESULT IN THE CANCELLATION OF YOUR SURGERY  PATIENT SIGNATURE_________________________________  NURSE SIGNATURE__________________________________  ________________________________________________________________________

## 2023-05-27 ENCOUNTER — Ambulatory Visit (HOSPITAL_COMMUNITY)
Admission: RE | Admit: 2023-05-27 | Discharge: 2023-05-27 | Disposition: A | Discharge status: Home / Self Care | Payer: PRIVATE HEALTH INSURANCE | Attending: Orthopedic Surgery | Admitting: Orthopedic Surgery

## 2023-05-27 ENCOUNTER — Ambulatory Visit (HOSPITAL_BASED_OUTPATIENT_CLINIC_OR_DEPARTMENT_OTHER): Payer: Self-pay | Admitting: Anesthesiology

## 2023-05-27 ENCOUNTER — Other Ambulatory Visit: Payer: Self-pay

## 2023-05-27 ENCOUNTER — Encounter (HOSPITAL_COMMUNITY): Payer: Self-pay | Admitting: Orthopedic Surgery

## 2023-05-27 ENCOUNTER — Encounter (HOSPITAL_COMMUNITY)
Admission: RE | Disposition: A | Discharge status: Home / Self Care | Payer: Self-pay | Source: Home / Self Care | Attending: Orthopedic Surgery

## 2023-05-27 ENCOUNTER — Ambulatory Visit (HOSPITAL_COMMUNITY): Payer: PRIVATE HEALTH INSURANCE | Admitting: Anesthesiology

## 2023-05-27 DIAGNOSIS — S46011A Strain of muscle(s) and tendon(s) of the rotator cuff of right shoulder, initial encounter: Secondary | ICD-10-CM | POA: Insufficient documentation

## 2023-05-27 DIAGNOSIS — F1020 Alcohol dependence, uncomplicated: Secondary | ICD-10-CM

## 2023-05-27 DIAGNOSIS — X58XXXA Exposure to other specified factors, initial encounter: Secondary | ICD-10-CM | POA: Diagnosis not present

## 2023-05-27 DIAGNOSIS — I4891 Unspecified atrial fibrillation: Secondary | ICD-10-CM | POA: Insufficient documentation

## 2023-05-27 DIAGNOSIS — K219 Gastro-esophageal reflux disease without esophagitis: Secondary | ICD-10-CM | POA: Insufficient documentation

## 2023-05-27 DIAGNOSIS — M75101 Unspecified rotator cuff tear or rupture of right shoulder, not specified as traumatic: Secondary | ICD-10-CM

## 2023-05-27 DIAGNOSIS — J45909 Unspecified asthma, uncomplicated: Secondary | ICD-10-CM | POA: Insufficient documentation

## 2023-05-27 DIAGNOSIS — Y990 Civilian activity done for income or pay: Secondary | ICD-10-CM | POA: Diagnosis not present

## 2023-05-27 DIAGNOSIS — F32A Depression, unspecified: Secondary | ICD-10-CM | POA: Diagnosis not present

## 2023-05-27 DIAGNOSIS — I10 Essential (primary) hypertension: Secondary | ICD-10-CM | POA: Insufficient documentation

## 2023-05-27 DIAGNOSIS — Z87891 Personal history of nicotine dependence: Secondary | ICD-10-CM | POA: Diagnosis not present

## 2023-05-27 DIAGNOSIS — F419 Anxiety disorder, unspecified: Secondary | ICD-10-CM | POA: Insufficient documentation

## 2023-05-27 DIAGNOSIS — D649 Anemia, unspecified: Secondary | ICD-10-CM

## 2023-05-27 HISTORY — PX: SHOULDER ARTHROSCOPY WITH ROTATOR CUFF REPAIR AND SUBACROMIAL DECOMPRESSION: SHX5686

## 2023-05-27 LAB — COMPREHENSIVE METABOLIC PANEL
ALT: 20 U/L (ref 0–44)
AST: 22 U/L (ref 15–41)
Albumin: 3.6 g/dL (ref 3.5–5.0)
Alkaline Phosphatase: 61 U/L (ref 38–126)
Anion gap: 8 (ref 5–15)
BUN: 12 mg/dL (ref 6–20)
CO2: 22 mmol/L (ref 22–32)
Calcium: 8.9 mg/dL (ref 8.9–10.3)
Chloride: 105 mmol/L (ref 98–111)
Creatinine, Ser: 0.9 mg/dL (ref 0.61–1.24)
GFR, Estimated: >60 mL/min (ref >60)
Glucose, Bld: 76 mg/dL (ref 70–99)
Potassium: 3.8 mmol/L (ref 3.5–5.1)
Sodium: 135 mmol/L (ref 135–145)
Total Bilirubin: 0.4 mg/dL (ref <1.2)
Total Protein: 6.5 g/dL (ref 6.5–8.1)

## 2023-05-27 LAB — CBC
HCT: 41 % (ref 39.0–52.0)
Hemoglobin: 13.5 g/dL (ref 13.0–17.0)
MCH: 31.2 pg (ref 26.0–34.0)
MCHC: 32.9 g/dL (ref 30.0–36.0)
MCV: 94.7 fL (ref 80.0–100.0)
Platelets: 251 10*3/uL (ref 150–400)
RBC: 4.33 MIL/uL (ref 4.22–5.81)
RDW: 11.9 % (ref 11.5–15.5)
WBC: 4.6 10*3/uL (ref 4.0–10.5)
nRBC: 0 % (ref 0.0–0.2)

## 2023-05-27 SURGERY — SHOULDER ARTHROSCOPY WITH ROTATOR CUFF REPAIR AND SUBACROMIAL DECOMPRESSION
Anesthesia: Regional | Laterality: Right

## 2023-05-27 MED ORDER — FENTANYL CITRATE PF 50 MCG/ML IJ SOSY: 25.0000 ug | PREFILLED_SYRINGE | INTRAMUSCULAR | Status: DC | PRN

## 2023-05-27 MED ORDER — DEXAMETHASONE SODIUM PHOSPHATE 10 MG/ML IJ SOLN
INTRAMUSCULAR | Status: DC | PRN
  Administered 2023-05-27: 10 mg via INTRAVENOUS

## 2023-05-27 MED ORDER — PROPOFOL 10 MG/ML IV BOLUS
INTRAVENOUS | Status: AC
  Filled 2023-05-27: qty 20

## 2023-05-27 MED ORDER — BUPIVACAINE HCL (PF) 0.5 % IJ SOLN
INTRAMUSCULAR | Status: DC | PRN
  Administered 2023-05-27: 15 mL via PERINEURAL

## 2023-05-27 MED ORDER — LIDOCAINE HCL (PF) 2 % IJ SOLN
INTRAMUSCULAR | Status: AC
  Filled 2023-05-27: qty 5

## 2023-05-27 MED ORDER — ONDANSETRON HCL 4 MG/2ML IJ SOLN
INTRAMUSCULAR | Status: AC
  Filled 2023-05-27: qty 2

## 2023-05-27 MED ORDER — ACETAMINOPHEN 500 MG PO TABS
1000.0000 mg | ORAL_TABLET | Freq: Once | ORAL | Status: AC
  Administered 2023-05-27: 1000 mg via ORAL
  Filled 2023-05-27: qty 2

## 2023-05-27 MED ORDER — BUPIVACAINE LIPOSOME 1.3 % IJ SUSP
INTRAMUSCULAR | Status: DC | PRN
  Administered 2023-05-27: 10 mL via PERINEURAL

## 2023-05-27 MED ORDER — MIDAZOLAM HCL 2 MG/2ML IJ SOLN
2.0000 mg | INTRAMUSCULAR | Status: DC
  Administered 2023-05-27: 2 mg via INTRAVENOUS
  Filled 2023-05-27: qty 2

## 2023-05-27 MED ORDER — SODIUM CHLORIDE 0.9 % IR SOLN
Status: DC | PRN
  Administered 2023-05-27 (×2): 3000 mL

## 2023-05-27 MED ORDER — PHENYLEPHRINE HCL-NACL 20-0.9 MG/250ML-% IV SOLN
INTRAVENOUS | Status: DC | PRN
  Administered 2023-05-27: 20 ug/min via INTRAVENOUS

## 2023-05-27 MED ORDER — PROPOFOL 10 MG/ML IV BOLUS
INTRAVENOUS | Status: DC | PRN
  Administered 2023-05-27: 50 ug/kg/min via INTRAVENOUS
  Administered 2023-05-27: 150 mg via INTRAVENOUS

## 2023-05-27 MED ORDER — CEFAZOLIN SODIUM-DEXTROSE 2-4 GM/100ML-% IV SOLN
2.0000 g | INTRAVENOUS | Status: AC
  Administered 2023-05-27: 2 g via INTRAVENOUS
  Filled 2023-05-27: qty 100

## 2023-05-27 MED ORDER — LACTATED RINGERS IV SOLN: INTRAVENOUS | Status: DC

## 2023-05-27 MED ORDER — ROCURONIUM BROMIDE 10 MG/ML (PF) SYRINGE
PREFILLED_SYRINGE | INTRAVENOUS | Status: DC | PRN
  Administered 2023-05-27: 50 mg via INTRAVENOUS

## 2023-05-27 MED ORDER — SUGAMMADEX SODIUM 200 MG/2ML IV SOLN
INTRAVENOUS | Status: DC | PRN
  Administered 2023-05-27: 200 mg via INTRAVENOUS

## 2023-05-27 MED ORDER — FENTANYL CITRATE PF 50 MCG/ML IJ SOSY
100.0000 ug | PREFILLED_SYRINGE | INTRAMUSCULAR | Status: DC
  Administered 2023-05-27: 50 ug via INTRAVENOUS
  Filled 2023-05-27: qty 2

## 2023-05-27 MED ORDER — LIDOCAINE 2% (20 MG/ML) 5 ML SYRINGE
INTRAMUSCULAR | Status: DC | PRN
  Administered 2023-05-27: 100 mg via INTRAVENOUS

## 2023-05-27 MED ORDER — TIZANIDINE HCL 4 MG PO TABS
4.0000 mg | ORAL_TABLET | Freq: Three times a day (TID) | ORAL | 0 refills | Status: DC | PRN
  Filled 2023-05-27: qty 30, 10d supply, fill #0

## 2023-05-27 MED ORDER — EPHEDRINE SULFATE-NACL 50-0.9 MG/10ML-% IV SOSY
PREFILLED_SYRINGE | INTRAVENOUS | Status: DC | PRN
  Administered 2023-05-27: 7.5 mg via INTRAVENOUS
  Administered 2023-05-27: 10 mg via INTRAVENOUS
  Administered 2023-05-27: 7.5 mg via INTRAVENOUS

## 2023-05-27 MED ORDER — DEXAMETHASONE SODIUM PHOSPHATE 10 MG/ML IJ SOLN
INTRAMUSCULAR | Status: AC
  Filled 2023-05-27: qty 1

## 2023-05-27 MED ORDER — FENTANYL CITRATE (PF) 100 MCG/2ML IJ SOLN
INTRAMUSCULAR | Status: AC
  Filled 2023-05-27: qty 2

## 2023-05-27 MED ORDER — ONDANSETRON HCL 4 MG/2ML IJ SOLN
INTRAMUSCULAR | Status: DC | PRN
  Administered 2023-05-27: 4 mg via INTRAVENOUS

## 2023-05-27 MED ORDER — PHENYLEPHRINE 80 MCG/ML (10ML) SYRINGE FOR IV PUSH (FOR BLOOD PRESSURE SUPPORT)
PREFILLED_SYRINGE | INTRAVENOUS | Status: DC | PRN
  Administered 2023-05-27 (×2): 120 ug via INTRAVENOUS

## 2023-05-27 MED ORDER — FENTANYL CITRATE (PF) 250 MCG/5ML IJ SOLN
INTRAMUSCULAR | Status: DC | PRN
  Administered 2023-05-27: 50 ug via INTRAVENOUS

## 2023-05-27 MED ORDER — OXYCODONE HCL 5 MG PO TABS
5.0000 mg | ORAL_TABLET | ORAL | 0 refills | Status: DC | PRN
  Filled 2023-05-27: qty 30, 5d supply, fill #0

## 2023-05-27 SURGICAL SUPPLY — 56 items
ANCHOR PEEK 4.75X19.1 SWLK C (Anchor) IMPLANT
ANCHOR SUT SWIVELOCK 4.75X19.1 (Anchor) IMPLANT
BAG COUNTER SPONGE SURGICOUNT (BAG) IMPLANT
BOOTIES KNEE HIGH SLOAN (MISCELLANEOUS) ×2 IMPLANT
BURR OVAL 8 FLU 4.0X13 (MISCELLANEOUS) ×1 IMPLANT
CANNULA 5.75X7 CRYSTAL CLEAR (CANNULA) ×1 IMPLANT
CANNULA TWIST IN 8.25X7CM (CANNULA) IMPLANT
COVER SURGICAL LIGHT HANDLE (MISCELLANEOUS) ×1 IMPLANT
CUTTER BONE 4.0MM X 13CM (MISCELLANEOUS) ×1 IMPLANT
DISSECTOR 3.8MM X 13CM (MISCELLANEOUS) IMPLANT
DRAPE FOOT SWITCH (DRAPES) ×2 IMPLANT
DRAPE IMP U-DRAPE 54X76 (DRAPES) ×1 IMPLANT
DRAPE INCISE IOBAN 66X45 STRL (DRAPES) IMPLANT
DRAPE STERI 35X30 U-POUCH (DRAPES) ×1 IMPLANT
DRAPE SURG ORHT 6 SPLT 77X108 (DRAPES) ×2 IMPLANT
DRAPE U-SHAPE 47X51 STRL (DRAPES) ×1 IMPLANT
DURAPREP 26ML APPLICATOR (WOUND CARE) ×1 IMPLANT
ELECT REM PT RETURN 15FT ADLT (MISCELLANEOUS) ×1 IMPLANT
GAUZE PAD ABD 8X10 STRL (GAUZE/BANDAGES/DRESSINGS) ×2 IMPLANT
GAUZE SPONGE 4X4 12PLY STRL (GAUZE/BANDAGES/DRESSINGS) ×1 IMPLANT
GAUZE XEROFORM 1X8 LF (GAUZE/BANDAGES/DRESSINGS) ×1 IMPLANT
GLOVE BIO SURGEON STRL SZ7.5 (GLOVE) ×1 IMPLANT
GLOVE BIOGEL PI IND STRL 6.5 (GLOVE) ×1 IMPLANT
GLOVE BIOGEL PI IND STRL 8 (GLOVE) ×1 IMPLANT
GLOVE SURG SS PI 6.5 STRL IVOR (GLOVE) ×1 IMPLANT
GOWN STRL REUS W/ TWL LRG LVL3 (GOWN DISPOSABLE) ×1 IMPLANT
GOWN STRL REUS W/ TWL XL LVL3 (GOWN DISPOSABLE) ×1 IMPLANT
GOWN STRL REUS W/TWL LRG LVL3 (GOWN DISPOSABLE) ×1
GOWN STRL REUS W/TWL XL LVL3 (GOWN DISPOSABLE) ×1
KIT BASIN OR (CUSTOM PROCEDURE TRAY) ×1 IMPLANT
KIT PUSHLOCK 2.9 HIP (KITS) IMPLANT
KIT TURNOVER KIT A (KITS) IMPLANT
LASSO 90 CVE QUICKPAS (DISPOSABLE) IMPLANT
LASSO CRESCENT QUICKPASS (SUTURE) IMPLANT
MANIFOLD NEPTUNE II (INSTRUMENTS) ×1 IMPLANT
NDL HD SCORPION MEGA LOADER (NEEDLE) IMPLANT
PACK ARTHROSCOPY WL (CUSTOM PROCEDURE TRAY) ×1 IMPLANT
PROBE BIPOLAR ATHRO 135MM 90D (MISCELLANEOUS) ×1 IMPLANT
PROTECTOR NERVE ULNAR (MISCELLANEOUS) ×1 IMPLANT
RESTRAINT HEAD UNIVERSAL NS (MISCELLANEOUS) IMPLANT
SLING ARM FOAM STRAP LRG (SOFTGOODS) IMPLANT
SLING ARM FOAM STRAP MED (SOFTGOODS) IMPLANT
SUPPORT WRAP ARM LG (MISCELLANEOUS) ×1 IMPLANT
SUT ETHILON 3 0 PS 1 (SUTURE) ×1 IMPLANT
SUT PDS AB 1 CT1 27 (SUTURE) IMPLANT
SUT TIGER TAPE 7 IN WHITE (SUTURE) IMPLANT
SUTURE TAPE 1.3 40 TPR END (SUTURE) IMPLANT
SUTURETAPE 1.3 40 TPR END (SUTURE)
TAPE CLOTH 3X10 WHT NS LF (GAUZE/BANDAGES/DRESSINGS) IMPLANT
TAPE FIBER 2MM 7IN #2 BLUE (SUTURE) IMPLANT
TAPE LABRALWHITE 1.5X36 (TAPE) IMPLANT
TAPE SUT LABRALTAP WHT/BLK (SUTURE) IMPLANT
TOWEL OR 17X26 10 PK STRL BLUE (TOWEL DISPOSABLE) ×1 IMPLANT
TOWEL OR NON WOVEN STRL DISP B (DISPOSABLE) ×1 IMPLANT
TUBING ARTHROSCOPY IRRIG 16FT (MISCELLANEOUS) ×1 IMPLANT
TUBING CONNECTING 10 (TUBING) ×1 IMPLANT

## 2023-05-27 NOTE — Anesthesia Preprocedure Evaluation (Addendum)
Anesthesia Evaluation  Patient identified by MRN, date of birth, ID band Patient awake    Reviewed: Allergy & Precautions, NPO status , Patient's Chart, lab work & pertinent test results, reviewed documented beta blocker date and time   Airway Mallampati: II  TM Distance: >3 FB Neck ROM: Full    Dental no notable dental hx. (+) Teeth Intact   Pulmonary asthma    Pulmonary exam normal breath sounds clear to auscultation       Cardiovascular hypertension, Pt. on home beta blockers and Pt. on medications Normal cardiovascular exam+ dysrhythmias Atrial Fibrillation  Rhythm:Regular Rate:Normal  TTE 2023 1. Left ventricular ejection fraction, by estimation, is 70 to 75%. The  left ventricle has hyperdynamic function. Left ventricular endocardial  border not optimally defined to evaluate regional wall motion.  Indeterminate diastolic filling due to E-A  fusion.   2. Right ventricular systolic function is hyperdynamic. The right  ventricular size is normal.   3. The mitral valve was not well visualized. No evidence of mitral valve  regurgitation. No evidence of mitral stenosis.   4. The aortic valve is tricuspid. Aortic valve regurgitation is not  visualized.   5. Aortic dilatation noted. There is moderate dilatation of the ascending  aorta, measuring 48 mm. There is mild dilatation of the aortic arch,  measuring 43 mm.   6. Technically difficult study with evidence of aortic dilation in the  ascending aorta and arch.      Neuro/Psych  PSYCHIATRIC DISORDERS Anxiety Depression    negative neurological ROS     GI/Hepatic ,GERD  ,,(+)     substance abuse  alcohol use  Endo/Other  negative endocrine ROS    Renal/GU negative Renal ROS  negative genitourinary   Musculoskeletal negative musculoskeletal ROS (+)    Abdominal   Peds  Hematology negative hematology ROS (+)   Anesthesia Other Findings 62 y.o. y/o male with a  h/o postoperative atrial fibrillation not on anticoagulation, previous surgical repair of ascending aortic aneurysm, hypertension, hyperlipidemia, thyroid cancer, transaminitis, PTSD, ETOH abuse, prostate cancer, asthma, and GERD  Reproductive/Obstetrics                             Anesthesia Physical Anesthesia Plan  ASA: 3  Anesthesia Plan: General and Regional   Post-op Pain Management: Regional block* and Tylenol PO (pre-op)*   Induction: Intravenous  PONV Risk Score and Plan: 2 and Midazolam, Dexamethasone and Ondansetron  Airway Management Planned: Oral ETT  Additional Equipment:   Intra-op Plan:   Post-operative Plan: Extubation in OR  Informed Consent: I have reviewed the patients History and Physical, chart, labs and discussed the procedure including the risks, benefits and alternatives for the proposed anesthesia with the patient or authorized representative who has indicated his/her understanding and acceptance.     Dental advisory given  Plan Discussed with: CRNA  Anesthesia Plan Comments:        Anesthesia Quick Evaluation

## 2023-05-27 NOTE — Discharge Instructions (Addendum)
Discharge Instructions after Arthroscopic Shoulder Repair ° ° °A sling has been provided for you. Remain in your sling at all times. This includes sleeping in your sling.  °Use ice on the shoulder intermittently over the first 48 hours after surgery.  °Pain medicine has been prescribed for you.  °Use your medicine liberally over the first 48 hours, and then you can begin to taper your use. You may take Extra Strength Tylenol or Tylenol only in place of the pain pills. DO NOT take ANY nonsteroidal anti-inflammatory pain medications: Advil, Motrin, Ibuprofen, Aleve, Naproxen, or Narprosyn.  °You may remove your dressing after two days. If the incision sites are still moist, place a Band-Aid over the moist site(s). Change Band-Aids daily until dry.  °You may shower 5 days after surgery. The incisions CANNOT get wet prior to 5 days. Simply allow the water to wash over the site and then pat dry. Do not rub the incisions. Make sure your axilla (armpit) is completely dry after showering.  °Take one aspirin a day for 2 weeks after surgery, unless you have an aspirin sensitivity/ allergy or asthma. ° ° °Please call [phone] during normal business hours or [phone] after hours for any problems. Including the following: ° °- excessive redness of the incisions °- drainage for more than 4 days °- fever of more than 101.5 F ° °*Please note that pain medications will not be refilled after hours or on weekends. ° ° ° °

## 2023-05-27 NOTE — Transfer of Care (Signed)
Immediate Anesthesia Transfer of Care Note  Patient: Austin Houston  Procedure(s) Performed: SHOULDER ARTHROSCOPY WITH ROTATOR CUFF REPAIR AND SUBACROMIAL DECOMPRESSION (Right)  Patient Location: PACU  Anesthesia Type:General  Level of Consciousness: drowsy  Airway & Oxygen Therapy: Patient Spontanous Breathing and Patient connected to face mask  Post-op Assessment: Report given to RN  Post vital signs: Reviewed and stable  Last Vitals:  Vitals Value Taken Time  BP 101/65 05/27/23 1106  Temp    Pulse 65 05/27/23 1109  Resp 16 05/27/23 1109  SpO2 100 % 05/27/23 1109  Vitals shown include unfiled device data.  Last Pain:  Vitals:   05/27/23 0908  TempSrc: Oral  PainSc:          Complications: No notable events documented.

## 2023-05-27 NOTE — Anesthesia Postprocedure Evaluation (Signed)
Anesthesia Post Note  Patient: Austin Houston  Procedure(s) Performed: SHOULDER ARTHROSCOPY WITH ROTATOR CUFF REPAIR AND SUBACROMIAL DECOMPRESSION (Right)     Patient location during evaluation: PACU Anesthesia Type: Regional and General Level of consciousness: awake and alert Pain management: pain level controlled Vital Signs Assessment: post-procedure vital signs reviewed and stable Respiratory status: spontaneous breathing, nonlabored ventilation, respiratory function stable and patient connected to nasal cannula oxygen Cardiovascular status: blood pressure returned to baseline and stable Postop Assessment: no apparent nausea or vomiting Anesthetic complications: no  No notable events documented.  Last Vitals:  Vitals:   05/27/23 1145 05/27/23 1155  BP: 114/76 108/70  Pulse: 67 61  Resp: 19 18  Temp:  (!) 36.4 C  SpO2: 96% 96%    Last Pain:  Vitals:   05/27/23 1145  TempSrc:   PainSc: 0-No pain                 Rahkim Rabalais L Jhana Giarratano

## 2023-05-27 NOTE — Anesthesia Procedure Notes (Addendum)
Anesthesia Regional Block: Interscalene brachial plexus block   Pre-Anesthetic Checklist: , timeout performed,  Correct Patient, Correct Site, Correct Laterality,  Correct Procedure, Correct Position, site marked,  Risks and benefits discussed,  Pre-op evaluation,  At surgeon's request and post-op pain management  Laterality: Right  Prep: Maximum Sterile Barrier Precautions used, chloraprep       Needles:  Injection technique: Single-shot  Needle Type: Echogenic Stimulator Needle     Needle Length: 5cm  Needle Gauge: 21     Additional Needles:   Procedures:,,,, ultrasound used (permanent image in chart),,    Narrative:  Start time: 05/27/2023 9:04 AM End time: 05/27/2023 9:08 AM Injection made incrementally with aspirations every 5 mL. Anesthesiologist: Elmer Picker, MD

## 2023-05-27 NOTE — H&P (Signed)
Austin Houston is an 62 y.o. male.   Chief Complaint: R shoulder pain and dysfunction  HPI: s/p injury at work with R traumatic RCT.  Failed conservative tx.  Past Medical History:  Diagnosis Date   Actinic keratosis    Allergic rhinitis due to pollen    Asthma    vs COPD- taking inhalers- as related to allergies only.   Basal cell carcinoma 03/26/2020   R lat deltoid - ED&C    Dysplastic nevus 03/07/2020   L costal infrapectoral - moderate   Dysplastic nevus 03/07/2020   R post flank above waistline - moderate   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HCC)    s/p partial thyroidectomy (follicular adenoma)-surgery only   GERD (gastroesophageal reflux disease)    Hearing impaired person, bilateral    hearing aida bilateral"high frequency loss   Hyperlipidemia    Hypertension    Prostate cancer (HCC) 01/08/2016   PTSD (post-traumatic stress disorder)    s/p multiple active deployments for [employer], no medications taken currently    Past Surgical History:  Procedure Laterality Date   COLONOSCOPY W/ POLYPECTOMY     age 58 "adenoma"   INGUINAL HERNIA REPAIR     right   LEFT HEART CATH AND CORONARY ANGIOGRAPHY N/A 07/20/2022   Procedure: LEFT HEART CATH AND CORONARY ANGIOGRAPHY;  Surgeon: Tonny Bollman, MD;  Location: [facility] INVASIVE CV LAB;  Service: Cardiovascular;  Laterality: N/A;   REPLACEMENT ASCENDING AORTA N/A 09/07/2022   Procedure: REPLACEMENT ASCENDING AORTA;  Surgeon: Alleen Borne, MD;  Location: [facility] OR;  Service: Open Heart Surgery;  Laterality: N/A;  WITH CIRC ARREST   ROBOT ASSISTED LAPAROSCOPIC RADICAL PROSTATECTOMY N/A 07/23/2016   Procedure: XI ROBOTIC ASSISTED LAPAROSCOPIC RADICAL PROSTATECTOMY LEVEL 1;  Surgeon: Heloise Purpura, MD;  Location: [facility] ORS;  Service: Urology;  Laterality: N/A;   TEE WITHOUT CARDIOVERSION N/A 09/07/2022   Procedure: TRANSESOPHAGEAL ECHOCARDIOGRAM (TEE);  Surgeon: Alleen Borne, MD;  Location: [facility] OR;  Service: Open Heart Surgery;  Laterality: N/A;    THYROIDECTOMY, PARTIAL     [name])    Family History  Problem Relation Age of Onset   Heart disease Paternal Grandmother    Prostate cancer Father        Recurrent x 3   Hyperlipidemia Father    Other Father        varicose veins   Hyperlipidemia Mother    Other Mother        varicose veins   Hypertension Maternal Grandmother    Hypertension Maternal Grandfather    Sudden death Brother 42   Colon cancer Neg Hx    Esophageal cancer Neg Hx    Rectal cancer Neg Hx    Stomach cancer Neg Hx    Social History:  reports that he has never smoked. He quit smokeless tobacco use about 10 years ago.  His smokeless tobacco use included snuff. He reports that he does not currently use alcohol. He reports that he does not use drugs.  Allergies:  Allergies  Allergen Reactions   Vioxx [Rofecoxib] Anaphylaxis and Other (See Comments)    Tolerates aspirin and naproxen.   Celebrex [Celecoxib] Rash and Other (See Comments)    Tolerates aspirin and naproxen.   Doxycycline Rash    Medications Prior to Admission  Medication Sig Dispense Refill   albuterol (VENTOLIN HFA) 108 (90 Base) MCG/ACT inhaler Inhale 2 puffs into the lungs every 4 (four) hours as needed for wheezing or shortness of breath.  aspirin EC 81 MG tablet Take 81 mg by mouth daily. Swallow whole.     budesonide-formoterol (SYMBICORT) 160-4.5 MCG/ACT inhaler Inhale 2 puffs into the lungs in the morning and at bedtime. 10.2 g 5   Carboxymethylcellul-Glycerin (LUBRICATING EYE DROPS OP) Apply 2 drops to eye daily as needed (dry eyes).     cetirizine (ZYRTEC) 10 MG tablet Take 10 mg by mouth daily.     escitalopram (LEXAPRO) 10 MG tablet Take 1 tablet (10 mg total) by mouth daily at 8am 90 tablet 1   fluticasone (FLONASE) 50 MCG/ACT nasal spray Place 2 sprays into both nostrils daily. (Patient taking differently: Place 1 spray into both nostrils daily.) 16 g 6   losartan (COZAAR) 25 MG tablet Take 1 tablet (25 mg total) by  mouth daily. 30 tablet 2   Magnesium 400 MG TABS Take 400 mg by mouth daily.     melatonin 3 MG TABS tablet Take 1 tablet (3 mg total) by mouth daily at 9pm (Patient taking differently: Take 3 mg by mouth at bedtime as needed (sleep).) 60 tablet 0   metoprolol tartrate (LOPRESSOR) 25 MG tablet Take 25 mg by mouth daily.     montelukast (SINGULAIR) 10 MG tablet Take 1 tablet (10 mg total) by mouth at bedtime. (Patient taking differently: Take 10 mg by mouth daily.) 30 tablet 5   Multiple Vitamin (MULTIVITAMIN WITH MINERALS) TABS tablet Take 1 tablet by mouth daily.     naltrexone (DEPADE) 50 MG tablet Take 1 tablet (50 mg total) by mouth daily at 8am 90 tablet 1   omeprazole (PRILOSEC) 40 MG capsule Take 1 capsule (40 mg total) by mouth daily. 90 capsule 3   traZODone (DESYREL) 50 MG tablet Take 1 tablet (50 mg total) by mouth daily at 9:00 PM ** 24 hour [place]: 1 tablet ** 90 tablet 3   ondansetron (ZOFRAN) 4 MG tablet Take 1 tablet (4 mg total) by mouth every 8 (eight) hours as needed for nausea or vomiting. 20 tablet 0   tadalafil (CIALIS) 5 MG tablet Take 5 mg by mouth daily.      Results for orders placed or performed during the hospital encounter of 05/27/23 (from the past 48 hour(s))  CBC     Status: None   Collection Time: 05/27/23  7:50 AM  Result Value Ref Range   WBC 4.6 4.0 - 10.5 K/uL   RBC 4.33 4.22 - 5.81 MIL/uL   Hemoglobin 13.5 13.0 - 17.0 g/dL   HCT 51.8 84.1 - 66.0 %   MCV 94.7 80.0 - 100.0 fL   MCH 31.2 26.0 - 34.0 pg   MCHC 32.9 30.0 - 36.0 g/dL   RDW 63.0 16.0 - 10.9 %   Platelets 251 150 - 400 K/uL   nRBC 0.0 0.0 - 0.2 %    Comment: Performed at [facility], [address], [place], [place] [address]   No results found.  Review of Systems  All other systems reviewed and are negative.   Blood pressure 122/73, temperature (!) 97.4 F (36.3 C), temperature source Oral, height 6\' 2"  (1.88 m), weight 88.5 kg. Physical Exam Constitutional:       Appearance: He is well-developed.  HENT:     Head: Atraumatic.  Eyes:     Extraocular Movements: Extraocular movements intact.  Cardiovascular:     Pulses: Normal pulses.  Pulmonary:     Effort: Pulmonary effort is normal.  Musculoskeletal:     Comments: R shoulder  pain with RC testing  Skin:    General: Skin is warm and dry.  Neurological:     Mental Status: He is alert and oriented to person, place, and time.  Psychiatric:        Mood and Affect: Mood normal.      Assessment/Plan s/p injury at work with R traumatic RCT.  Failed conservative tx. Plan R arth RCR/SAD Risks / benefits of surgery discussed Consent on chart  NPO for OR Preop antibiotics   Glennon Hamilton, MD 05/27/2023, 8:40 AM

## 2023-05-27 NOTE — Anesthesia Procedure Notes (Signed)
Procedure Name: Intubation Date/Time: 05/27/2023 9:41 AM  Performed by: Randa Evens, CRNAPre-anesthesia Checklist: Patient identified, Emergency Drugs available, Suction available and Patient being monitored Patient Re-evaluated:Patient Re-evaluated prior to induction Oxygen Delivery Method: Circle System Utilized Preoxygenation: Pre-oxygenation with 100% oxygen Induction Type: IV induction Ventilation: Mask ventilation without difficulty Laryngoscope Size: Mac and 4 Grade View: Grade I Tube type: Oral Tube size: 7.5 mm Number of attempts: 1 Airway Equipment and Method: Stylet and Oral airway Placement Confirmation: ETT inserted through vocal cords under direct vision, positive ETCO2 and breath sounds checked- equal and bilateral Tube secured with: Tape Dental Injury: Teeth and Oropharynx as per pre-operative assessment

## 2023-05-27 NOTE — Op Note (Signed)
Procedure(s): SHOULDER ARTHROSCOPY WITH ROTATOR CUFF REPAIR AND SUBACROMIAL DECOMPRESSION Procedure Note  KAIS ALBERTA male 62 y.o. 05/27/2023  Preoperative diagnosis: #1 right shoulder full-thickness traumatic rotator cuff tear #2 right shoulder impingement with unfavorable acromial anatomy  Postoperative diagnosis: Same  Procedure(s) and Anesthesia Type:    * SHOULDER ARTHROSCOPY WITH ROTATOR CUFF REPAIR AND SUBACROMIAL DECOMPRESSION - General  Surgeons and Role:    Jones Broom, MD - Primary     Surgeon: Glennon Hamilton   Assistants: Fredia Sorrow PA-C Amber was present and scrubbed throughout the procedure and was essential in positioning, assisting with the camera and instrumentation,, and closure)  Anesthesia: General endotracheal anesthesia with preoperative interscalene block given by the attending anesthesiologist     Procedure Detail  SHOULDER ARTHROSCOPY WITH ROTATOR CUFF REPAIR AND SUBACROMIAL DECOMPRESSION  Estimated Blood Loss: Min         Drains: none  Blood Given: none         Specimens: none        Complications:  * No complications entered in OR log *         Disposition: PACU - hemodynamically stable.         Condition: stable    Procedure:   INDICATIONS FOR SURGERY: The patient is 62 y.o. male who was injured at work with right shoulder full-thickness rotator cuff tear.  Indicated for surgical treatment to decrease pain and restore function.  OPERATIVE FINDINGS: Examination under anesthesia: No stiffness or instability   DESCRIPTION OF PROCEDURE: The patient was identified in preoperative  holding area where I personally marked the operative site after  verifying site, side, and procedure with the patient. An interscalene block was given by the attending anesthesiologist the holding area.  The patient was taken back to the operating room where general anesthesia was induced without complication and was placed in the  beach-chair position with the back  elevated about 60 degrees and all extremities and head and neck carefully padded and  positioned.   The right upper extremity was then prepped and  draped in a standard sterile fashion. The appropriate time-out  procedure was carried out. The patient did receive IV antibiotics  within 30 minutes of incision.   A small posterior portal incision was made and the arthroscope was introduced into the joint. An anterior portal was then established above the subscapularis using needle localization. Small cannula was placed anteriorly. Diagnostic arthroscopy was then carried out   The subscapularis was noted to be intact.  There was fraying of the superior labrum which was debrided back.  The biceps anchor was intact.  The biceps tendon looked healthy.  Joint surfaces were intact without chondromalacia.  The undersurface of the supraspinatus was noted to have full-thickness tearing without significant retraction.  This was debrided from the undersurface.  The arthroscope was then introduced into the subacromial space a standard lateral portal was established with needle localization. The shaver was used through the lateral portal to perform extensive bursectomy. Coracoacromial ligament was examined and found to be frayed indicating chronic impingement.  The bursal side of the rotator cuff tear was identified and debrided back to healthy tendon.  The tuberosity was debrided down to bleeding bone to promote healing.  The repair was then carried out in a double row fashion with 2 4.75 peek swivel lock anchors placed just off the articular margin preloaded with fiber tape.  These were brought over to 2 additional 4.75 peek swivel lock anchors in  the lateral row.  The additional knotless mechanism from the lateral anchors were used for additional fixation.  The repair was smooth and watertight.  The coracoacromial ligament was taken down off the anterior acromion with the  ArthroCare exposing a moderate hooked anterior acromial spur. A high-speed bur was then used through the lateral portal to take down the anterior acromial spur from lateral to medial in a standard acromioplasty.  The acromioplasty was also viewed from the lateral portal and the bur was used as necessary to ensure that the acromion was completely flat from posterior to anterior.  The arthroscopic equipment was removed from the joint and the portals were closed with 3-0 nylon in an interrupted fashion. Sterile dressings were then applied including Xeroform 4 x 4's ABDs and tape. The patient was then allowed to awaken from general anesthesia, placed in a sling, transferred to the stretcher and taken to the recovery room in stable condition.   POSTOPERATIVE PLAN: The patient will be discharged home today and will followup in one week for suture removal and wound check.  He will follow the standard cuff protocol.

## 2023-05-28 ENCOUNTER — Encounter (HOSPITAL_COMMUNITY): Payer: Self-pay | Admitting: Orthopedic Surgery

## 2023-05-28 NOTE — Telephone Encounter (Signed)
05/21/23 ov note giving clearance was faxed to [facility]

## 2023-06-06 ENCOUNTER — Other Ambulatory Visit: Payer: Self-pay | Admitting: Family Medicine

## 2023-06-07 ENCOUNTER — Other Ambulatory Visit: Payer: Self-pay

## 2023-06-07 ENCOUNTER — Other Ambulatory Visit: Payer: Self-pay | Admitting: Family Medicine

## 2023-06-07 MED ORDER — OMEPRAZOLE 40 MG PO CPDR
40.0000 mg | DELAYED_RELEASE_CAPSULE | Freq: Every day | ORAL | 3 refills | Status: DC
  Filled 2023-06-07 – 2023-07-01 (×2): qty 90, 90d supply, fill #0
  Filled 2023-10-11 (×2): qty 90, 90d supply, fill #1
  Filled 2024-01-03 – 2024-01-06 (×2): qty 90, 90d supply, fill #2
  Filled 2024-04-01: qty 90, 90d supply, fill #3

## 2023-06-08 ENCOUNTER — Ambulatory Visit: Payer: PRIVATE HEALTH INSURANCE | Attending: Orthopedic Surgery

## 2023-06-08 DIAGNOSIS — M6281 Muscle weakness (generalized): Secondary | ICD-10-CM | POA: Insufficient documentation

## 2023-06-08 DIAGNOSIS — M25511 Pain in right shoulder: Secondary | ICD-10-CM | POA: Insufficient documentation

## 2023-06-08 NOTE — Therapy (Signed)
OUTPATIENT PHYSICAL THERAPY SHOULDER EVALUATION   Patient Name: Austin Houston MRN: [id number] DOB:09/12/1960, 62 y.o., male Today's Date: 06/08/2023  END OF SESSION:  PT End of Session - 06/08/23 1239     Visit Number 1    Number of Visits 25    Date for PT Re-Evaluation 08/31/23    PT Start Time 1116    PT Stop Time 1202    PT Time Calculation (min) 46 min    Activity Tolerance Patient tolerated treatment well    Behavior During Therapy WFL for tasks assessed/performed             Past Medical History:  Diagnosis Date   Actinic keratosis    Allergic rhinitis due to pollen    Asthma    vs COPD- taking inhalers- as related to allergies only.   Basal cell carcinoma 03/26/2020   R lat deltoid - ED&C    Dysplastic nevus 03/07/2020   L costal infrapectoral - moderate   Dysplastic nevus 03/07/2020   R post flank above waistline - moderate   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HCC)    s/p partial thyroidectomy (follicular adenoma)-surgery only   GERD (gastroesophageal reflux disease)    Hearing impaired person, bilateral    hearing aida bilateral"high frequency loss   Hyperlipidemia    Hypertension    Prostate cancer (HCC) 01/08/2016   PTSD (post-traumatic stress disorder)    s/p multiple active deployments for [employer], no medications taken currently   Past Surgical History:  Procedure Laterality Date   COLONOSCOPY W/ POLYPECTOMY     age 31 "adenoma"   INGUINAL HERNIA REPAIR     right   LEFT HEART CATH AND CORONARY ANGIOGRAPHY N/A 07/20/2022   Procedure: LEFT HEART CATH AND CORONARY ANGIOGRAPHY;  Surgeon: Tonny Bollman, MD;  Location: [facility] INVASIVE CV LAB;  Service: Cardiovascular;  Laterality: N/A;   REPLACEMENT ASCENDING AORTA N/A 09/07/2022   Procedure: REPLACEMENT ASCENDING AORTA;  Surgeon: Alleen Borne, MD;  Location: [facility] OR;  Service: Open Heart Surgery;  Laterality: N/A;  WITH CIRC ARREST   ROBOT ASSISTED LAPAROSCOPIC RADICAL PROSTATECTOMY N/A 07/23/2016    Procedure: XI ROBOTIC ASSISTED LAPAROSCOPIC RADICAL PROSTATECTOMY LEVEL 1;  Surgeon: Heloise Purpura, MD;  Location: [facility] ORS;  Service: Urology;  Laterality: N/A;   SHOULDER ARTHROSCOPY WITH ROTATOR CUFF REPAIR AND SUBACROMIAL DECOMPRESSION Right 05/27/2023   Procedure: SHOULDER ARTHROSCOPY WITH ROTATOR CUFF REPAIR AND SUBACROMIAL DECOMPRESSION;  Surgeon: Jones Broom, MD;  Location: [facility] ORS;  Service: Orthopedics;  Laterality: Right;  NEEDS 90 MINUTES   TEE WITHOUT CARDIOVERSION N/A 09/07/2022   Procedure: TRANSESOPHAGEAL ECHOCARDIOGRAM (TEE);  Surgeon: Alleen Borne, MD;  Location: [facility] OR;  Service: Open Heart Surgery;  Laterality: N/A;   THYROIDECTOMY, PARTIAL     [facility])   Patient Active Problem List   Diagnosis Date Noted   Anxiety and depression 02/21/2023   Pancytopenia (HCC) 02/21/2023   Elevated liver enzymes 02/21/2023   Alcohol use disorder, severe 02/16/2023   S/P ascending aortic aneurysm repair 09/07/2022   Aneurysm of ascending aorta without rupture (HCC) 07/20/2022   Mild persistent asthma without complication 01/24/2020   Prostate cancer (HCC) 01/08/2016   Varicose veins of bilateral lower extremities with other complications 03/01/2013   Former smokeless tobacco use 11/25/2012   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HCC)    GERD (gastroesophageal reflux disease)    Allergic rhinitis due to pollen    Hypertension    Hyperlipidemia    PTSD (post-traumatic stress disorder)  PCP: Hannah Beat, MD  REFERRING PROVIDER: Berline Lopes  REFERRING DIAG: RIGHT SHOULDER ROTATOR CUFF TEAR   THERAPY DIAG:  Acute pain of right shoulder  Muscle weakness (generalized)  Rationale for Evaluation and Treatment: Rehabilitation  ONSET DATE: 05/27/23  SUBJECTIVE:                                                                                                                                                                                      SUBJECTIVE STATEMENT: Pt  is a pleasant 62 y.o. male referred to OPPT s/p R RTC tear repair and subacromial decompression 05/27/23. Hand dominance: Right  PERTINENT HISTORY: Pt is a pleasant 62 y.o. male referred to OPPT s/p R RTC tear repair and subacromial decompression 05/27/23. Pt reports having a fall at work in the [employer] onto his R shoulder. Happened December 5th, 2023. Waited for his RTC surgery as he had open heart surgery for ascending aortic aneurysm in February 2024. Pain is minimal, controlled via Tylenol. R handed. Pt works in [employer] lab at [employer] requiring BUE use such as mobilizing patients, transfers, and use of equipment for performing catheterizations.   PAIN:  Are you having pain? Yes: NPRS scale: 0/10 Pain location: Top of shoulder Pain description: Dull, discomfort Aggravating factors: motion Relieving factors: rest  PRECAUTIONS: Other: RTC repair protocol  RED FLAGS: None   WEIGHT BEARING RESTRICTIONS: No  FALLS:  Has patient fallen in last 6 months? No  LIVING ENVIRONMENT: Lives with: lives with their spouse Lives in: House/apartment  OCCUPATION: Cardiovascular Specialist  PLOF: Independent  PATIENT GOALS:Return to occupation, return to PLOF.  NEXT MD VISIT: N/A  OBJECTIVE:  Note: Objective measures were completed at Evaluation unless otherwise noted.  DIAGNOSTIC FINDINGS:  N/A  PATIENT SURVEYS:  FOTO 47/68  COGNITION: Overall cognitive status: Within functional limits for tasks assessed     SENSATION: WFL  POSTURE: Mildly rounded shoulders  CERVICAL AROM: Flexion: 70 Extension: 55 Rotation R/L: 60/70 Lateral flexion R/L: 45/45  UPPER EXTREMITY ROM:   Active ROM unless otherwise noted Right Eval PROM 11/26 Left eval  Shoulder flexion 81 165  Shoulder extension  65  Shoulder abduction  149  Shoulder adduction    Shoulder internal rotation 55 90  Shoulder external rotation 20 65  Elbow flexion WNL WNL  Elbow extension 0 WNL  Wrist  flexion WNL WNL  Wrist extension WNL WNL  Wrist ulnar deviation    Wrist radial deviation    Wrist pronation WNL WNL  Wrist supination WNL WNL  (Blank rows = not tested)  UPPER EXTREMITY MMT:  MMT Right Eval Left Eval  Shoulder flexion deferred  5  Shoulder extension deferred   Shoulder abduction deferred 5  Shoulder adduction    Shoulder internal rotation deferred 5  Shoulder external rotation deferred 5  Middle trapezius    Lower trapezius    Elbow flexion  5  Elbow extension  4 (seated)  Wrist flexion    Wrist extension    Wrist ulnar deviation    Wrist radial deviation    Wrist pronation    Wrist supination    Grip strength (lbs)    (Blank rows = not tested)  SHOULDER SPECIAL TESTS: Not indicated  JOINT MOBILITY TESTING:  Deferred to next session  PALPATION:  TTP near incision on R shoulder   TODAY'S TREATMENT:                                                                                                                                         DATE: 06/08/23  There.ex: Supine R shoulder PROM: 2x10 to ~90 degrees forward flexion Supine R shoulder PROM: 2x12 to 20 degrees, ER.  Reviewed HEP with pendulums, elbow/wrist AROM, and PROM of shoulder flexion and ER. Educated on reps/sets/frequency.    PATIENT EDUCATION: Education details: Protocol, POC, HEP, prognosis Person educated: Patient Education method: Explanation Education comprehension: verbalized understanding, returned demonstration, verbal cues required, and tactile cues required  HOME EXERCISE PROGRAM: Access Code: [email or web address] URL: [email or web address] Date: 06/08/2023 Prepared by: Ronnie Derby  Exercises - Circular Shoulder Pendulum with Table Support  - 1 x daily - 7 x weekly - 2 sets - 15 reps - Supine Shoulder Flexion PROM  - 1 x daily - 7 x weekly - 2 sets - 15 reps - Supine Shoulder External Rotation and Internal Rotation PROM with Caregiver  - 1 x daily - 7 x weekly - 2  sets - 15 reps - Seated Scapular Retraction  - 1 x daily - 7 x weekly - 3 sets - 15 reps  ASSESSMENT:  CLINICAL IMPRESSION: Patient is a 62 y.o. male who was seen today for physical therapy evaluation and treatment for s/p R shoulder RTC repair and subacromial debridement on 05/27/23. Pt with normal cervical and R elbow/wrist AROM and expected strength and AROM deficits from surgical intervention. Pt at expected shoulder flexion, ER PROM requirements per protocol. Pt with excellent understanding of precautions, sling wear, and exercises prescribed. Pt currently limited in R shoulder use due to surgery and protocol with inability to complete ADL's and work related tasks. Pt will require and benefit from skilled PT to address his deficits and maximize return of R shoulder AROM and strength to PLOF.   OBJECTIVE IMPAIRMENTS: decreased mobility, decreased ROM, decreased strength, impaired UE functional use, postural dysfunction, and pain.   ACTIVITY LIMITATIONS: carrying, lifting, bathing, toileting, dressing, reach over head, and hygiene/grooming  PARTICIPATION LIMITATIONS: cleaning, community activity, occupation, and yard work  PERSONAL FACTORS: Age, Education, Fitness, Past/current experiences, Profession, and Time since  onset of injury/illness/exacerbation are also affecting patient's functional outcome.   REHAB POTENTIAL: Good  CLINICAL DECISION MAKING: Stable/uncomplicated  EVALUATION COMPLEXITY: Low   GOALS: Goals reviewed with patient? No  SHORT TERM GOALS: Target date: 07/06/23 unless otherwise posted below  Pt will be independent with HEP to improve R shoulder AROM and strength within protocol parameters to improve functional mobility.  Baseline: 06/08/23: Provided initial HEP  Goal status: INITIAL  2.  Pt will demonstrate R shoulder AROM in flexion to at least 120 degrees in supine for gradual return to overhead ADL completion. Baseline: 06/08/23: Phase 1 of current protocol  (PROM) Goal status: INITIAL Target Date: 07/21/22 (8 weeks post op)  LONG TERM GOALS: Target date: 08/31/23 unless otherwise posted below  Pt will improve FOTO to target score to demonstrate clinically significant improvement in functional mobility. Baseline: 06/08/23: 47/68 Goal status: INITIAL  2.  Pt will demonstrate full, R shoulder AROM in all planes equal to LUE to demonstrate return to full capacity to complete ADL's.  Baseline: 06/08/23  Active ROM unless otherwise noted Right Eval PROM 11/26 Left eval  Shoulder flexion 81 165  Shoulder extension  65  Shoulder abduction  149  Shoulder adduction    Shoulder internal rotation 55 90  Shoulder external rotation 20 65  Elbow flexion WNL WNL  Elbow extension 0 WNL  Wrist flexion WNL WNL  Wrist extension WNL WNL  Wrist ulnar deviation    Wrist radial deviation    Wrist pronation WNL WNL  Wrist supination WNL WNL  (Blank rows = not tested) Goal status: INITIAL  3.  Pt will be independent with updated HEP program including periscapular and RTC strength/stability related exercises to begin return to ability to complete work related and recreational related activity.   Baseline: 06/08/23:  Goal status: INITIAL  4.  Pt will report return to full work related tasks without strength or other reported functional deficits to demonstrate return to PLOF.  Baseline:  Goal status: INITIAL Target date: 11/28/22 (16 weeks post-op) PLAN:  PT FREQUENCY: 1-2x/week  PT DURATION: 12 weeks  PLANNED INTERVENTIONS: 97164- PT Re-evaluation, 97110-Therapeutic exercises, 97530- Therapeutic activity, 97112- Neuromuscular re-education, 97535- Self Care, 16109- Manual therapy, 97014- Electrical stimulation (unattended), [phone]- Electrical stimulation (manual), Patient/Family education, Dry Needling, Joint mobilization, Spinal manipulation, Spinal mobilization, Cryotherapy, Moist heat, and Biofeedback  PLAN FOR NEXT SESSION: maintain phase 1 of  protocol. Review HEP. Educate spouse on completing shoulder PROM.    Delphia Grates. Fairly IV, PT, DPT Physical Therapist- [facility]  06/08/2023, 1:56 PM

## 2023-06-09 ENCOUNTER — Other Ambulatory Visit: Payer: Self-pay

## 2023-06-09 DIAGNOSIS — H903 Sensorineural hearing loss, bilateral: Secondary | ICD-10-CM | POA: Diagnosis not present

## 2023-06-14 ENCOUNTER — Ambulatory Visit: Payer: PRIVATE HEALTH INSURANCE | Attending: Orthopedic Surgery

## 2023-06-14 DIAGNOSIS — M25511 Pain in right shoulder: Secondary | ICD-10-CM | POA: Diagnosis present

## 2023-06-14 DIAGNOSIS — M6281 Muscle weakness (generalized): Secondary | ICD-10-CM | POA: Diagnosis present

## 2023-06-14 NOTE — Therapy (Signed)
OUTPATIENT PHYSICAL THERAPY SHOULDER TREATMENT   Patient Name: Austin Houston MRN: [id number] DOB:28-Oct-1960, 62 y.o., male Today's Date: 06/14/2023  END OF SESSION:  PT End of Session - 06/14/23 1335     Visit Number 2    Number of Visits 25    Date for PT Re-Evaluation 08/31/23    PT Start Time 1300    PT Stop Time 1332    PT Time Calculation (min) 32 min    Activity Tolerance Patient tolerated treatment well    Behavior During Therapy WFL for tasks assessed/performed             Past Medical History:  Diagnosis Date   Actinic keratosis    Allergic rhinitis due to pollen    Asthma    vs COPD- taking inhalers- as related to allergies only.   Basal cell carcinoma 03/26/2020   R lat deltoid - ED&C    Dysplastic nevus 03/07/2020   L costal infrapectoral - moderate   Dysplastic nevus 03/07/2020   R post flank above waistline - moderate   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HCC)    s/p partial thyroidectomy (follicular adenoma)-surgery only   GERD (gastroesophageal reflux disease)    Hearing impaired person, bilateral    hearing aida bilateral"high frequency loss   Hyperlipidemia    Hypertension    Prostate cancer (HCC) 01/08/2016   PTSD (post-traumatic stress disorder)    s/p multiple active deployments for [employer], no medications taken currently   Past Surgical History:  Procedure Laterality Date   COLONOSCOPY W/ POLYPECTOMY     age 24 "adenoma"   INGUINAL HERNIA REPAIR     right   LEFT HEART CATH AND CORONARY ANGIOGRAPHY N/A 07/20/2022   Procedure: LEFT HEART CATH AND CORONARY ANGIOGRAPHY;  Surgeon: Tonny Bollman, MD;  Location: [facility];  Service: Cardiovascular;  Laterality: N/A;   REPLACEMENT ASCENDING AORTA N/A 09/07/2022   Procedure: REPLACEMENT ASCENDING AORTA;  Surgeon: Alleen Borne, MD;  Location: [facility] OR;  Service: Open Heart Surgery;  Laterality: N/A;  WITH CIRC ARREST   ROBOT ASSISTED LAPAROSCOPIC RADICAL PROSTATECTOMY N/A 07/23/2016    Procedure: XI ROBOTIC ASSISTED LAPAROSCOPIC RADICAL PROSTATECTOMY LEVEL 1;  Surgeon: Heloise Purpura, MD;  Location: [facility] ORS;  Service: Urology;  Laterality: N/A;   SHOULDER ARTHROSCOPY WITH ROTATOR CUFF REPAIR AND SUBACROMIAL DECOMPRESSION Right 05/27/2023   Procedure: SHOULDER ARTHROSCOPY WITH ROTATOR CUFF REPAIR AND SUBACROMIAL DECOMPRESSION;  Surgeon: Jones Broom, MD;  Location: [facility] OR[facility];  Service: Orthopedics;  Laterality: Right;  NEEDS 90 MINUTES   TEE WITHOUT CARDIOVERSION N/A 09/07/2022   Procedure: TRANSESOPHAGEAL ECHOCARDIOGRAM (TEE);  Surgeon: Alleen Borne, MD;  Location: [facility] OR;  Service: Open Heart Surgery;  Laterality: N/A;   THYROIDECTOMY, PARTIAL     [facility])   Patient Active Problem List   Diagnosis Date Noted   Anxiety and depression 02/21/2023   Pancytopenia (HCC) 02/21/2023   Elevated liver enzymes 02/21/2023   Alcohol use disorder, severe 02/16/2023   S/P ascending aortic aneurysm repair 09/07/2022   Aneurysm of ascending aorta without rupture (HCC) 07/20/2022   Mild persistent asthma without complication 01/24/2020   Prostate cancer (HCC) 01/08/2016   Varicose veins of bilateral lower extremities with other complications 03/01/2013   Former smokeless tobacco use 11/25/2012   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HCC)    GERD (gastroesophageal reflux disease)    Allergic rhinitis due to pollen    Hypertension    Hyperlipidemia    PTSD (post-traumatic stress disorder)  PCP: Hannah Beat, MD  REFERRING PROVIDER: Berline Lopes  REFERRING DIAG: RIGHT SHOULDER ROTATOR CUFF TEAR   THERAPY DIAG:  Acute pain of right shoulder  Muscle weakness (generalized)  Rationale for Evaluation and Treatment: Rehabilitation  ONSET DATE: 05/27/23  SUBJECTIVE:                                                                                                                                                                                      SUBJECTIVE STATEMENT: Pt  reports no issues with R shoulder. Has been compliant with HEP. Brought spouse to treatment to educate on PROM and protocol as pt will be out of town for the next week or so.  Hand dominance: Right  PERTINENT HISTORY: Pt is a pleasant 62 y.o. male referred to OPPT s/p R RTC tear repair and subacromial decompression 05/27/23. Pt reports having a fall at work in the [employer] onto his R shoulder. Happened December 5th, 2023. Waited for his RTC surgery as he had open heart surgery for ascending aortic aneurysm in February 2024. Pain is minimal, controlled via Tylenol. R handed. Pt works in [employer] lab at [employer] requiring BUE use such as mobilizing patients, transfers, and use of equipment for performing catheterizations.   PAIN:  Are you having pain? Yes: NPRS scale: 0/10 Pain location: Top of shoulder Pain description: Dull, discomfort Aggravating factors: motion Relieving factors: rest  PRECAUTIONS: Other: RTC repair protocol  RED FLAGS: None   WEIGHT BEARING RESTRICTIONS: No  FALLS:  Has patient fallen in last 6 months? No  LIVING ENVIRONMENT: Lives with: lives with their spouse Lives in: House/apartment  OCCUPATION: Cardiovascular Specialist  PLOF: Independent  PATIENT GOALS:Return to occupation, return to PLOF.  NEXT MD VISIT: N/A  OBJECTIVE:  Note: Objective measures were completed at Evaluation unless otherwise noted.  DIAGNOSTIC FINDINGS:  N/A  PATIENT SURVEYS:  FOTO 47/68  COGNITION: Overall cognitive status: Within functional limits for tasks assessed     SENSATION: WFL  POSTURE: Mildly rounded shoulders  CERVICAL AROM: Flexion: 70 Extension: 55 Rotation R/L: 60/70 Lateral flexion R/L: 45/45  UPPER EXTREMITY ROM:   Active ROM unless otherwise noted Right Eval PROM 11/26 Left eval  Shoulder flexion 81 165  Shoulder extension  65  Shoulder abduction  149  Shoulder adduction    Shoulder internal rotation 55 90  Shoulder external  rotation 20 65  Elbow flexion WNL WNL  Elbow extension 0 WNL  Wrist flexion WNL WNL  Wrist extension WNL WNL  Wrist ulnar deviation    Wrist radial deviation    Wrist pronation WNL WNL  Wrist supination WNL WNL  (Blank rows =  not tested)  UPPER EXTREMITY MMT:  MMT Right Eval Left Eval  Shoulder flexion deferred 5  Shoulder extension deferred   Shoulder abduction deferred 5  Shoulder adduction    Shoulder internal rotation deferred 5  Shoulder external rotation deferred 5  Middle trapezius    Lower trapezius    Elbow flexion  5  Elbow extension  4 (seated)  Wrist flexion    Wrist extension    Wrist ulnar deviation    Wrist radial deviation    Wrist pronation    Wrist supination    Grip strength (lbs)    (Blank rows = not tested)  SHOULDER SPECIAL TESTS: Not indicated  JOINT MOBILITY TESTING:  Deferred to next session  PALPATION:  TTP near incision on R shoulder   TODAY'S TREATMENT:                                                                                                                                         DATE: 06/14/23  There.ex: Supine R shoulder PROM: 1x12 to ~100-105 degrees forward flexion Supine R shoulder PROM: 2x12 to 20 degrees, ER.  Reviewed HEP with pendulums, elbow/wrist AROM, and PROM of shoulder flexion and ER.  Gentle AP/PA grade 1 GHJ mobilizations: x5/ 5 sec oscillations/rep/direction.   Self-care management:  Educated spouse on protocol. Reviewed safe completion of R shoulder flexion and ER via PROM. Reviewed pt and her positioning to complete safely and ergonomically with review on appropriate ranges to complete flexion and ER in per provided protocol. Pt's spouse demonstrates correct completion of x10 shoulder flexion and ER via PROM.   PATIENT EDUCATION: Education details: Protocol, POC, HEP, prognosis Person educated: Patient Education method: Explanation Education comprehension: verbalized understanding, returned  demonstration, verbal cues required, and tactile cues required  HOME EXERCISE PROGRAM: Access Code: [email or web address] URL: [email or web address] Date: 06/08/2023 Prepared by: Ronnie Derby  Exercises - Circular Shoulder Pendulum with Table Support  - 1 x daily - 7 x weekly - 2 sets - 15 reps - Supine Shoulder Flexion PROM  - 1 x daily - 7 x weekly - 2 sets - 15 reps - Supine Shoulder External Rotation and Internal Rotation PROM with Caregiver  - 1 x daily - 7 x weekly - 2 sets - 15 reps - Seated Scapular Retraction  - 1 x daily - 7 x weekly - 3 sets - 15 reps  ASSESSMENT:  CLINICAL IMPRESSION: Pt arriving to clinic for first treatment session. Pt is tolerating and performing R shoulder mobility per protocol expectations reaching ~100-105 degrees flexion via PROM and maintaining 20 degrees ER. Educated and supervised spouse on safe completion today with good understanding of correct form/technique to pt comfort. Reviewed protocol guidelines with pt and spouse understanding. Encouraged current HEP with spouse assist. Will maintain 1x/week in this phase of protocol as pt is where he currently needs to be with R shoulder mobility in  protection phase. Pt will require and benefit from skilled PT to address his deficits and maximize return of R shoulder AROM and strength to PLOF.   OBJECTIVE IMPAIRMENTS: decreased mobility, decreased ROM, decreased strength, impaired UE functional use, postural dysfunction, and pain.   ACTIVITY LIMITATIONS: carrying, lifting, bathing, toileting, dressing, reach over head, and hygiene/grooming  PARTICIPATION LIMITATIONS: cleaning, community activity, occupation, and yard work  PERSONAL FACTORS: Age, Education, Fitness, Past/current experiences, Profession, and Time since onset of injury/illness/exacerbation are also affecting patient's functional outcome.   REHAB POTENTIAL: Good  CLINICAL DECISION MAKING: Stable/uncomplicated  EVALUATION COMPLEXITY:  Low   GOALS: Goals reviewed with patient? No  SHORT TERM GOALS: Target date: 07/06/23 unless otherwise posted below  Pt will be independent with HEP to improve R shoulder AROM and strength within protocol parameters to improve functional mobility.  Baseline: 06/08/23: Provided initial HEP  Goal status: INITIAL  2.  Pt will demonstrate R shoulder AROM in flexion to at least 120 degrees in supine for gradual return to overhead ADL completion. Baseline: 06/08/23: Phase 1 of current protocol (PROM) Goal status: INITIAL Target Date: 07/21/22 (8 weeks post op)  LONG TERM GOALS: Target date: 08/31/23 unless otherwise posted below  Pt will improve FOTO to target score to demonstrate clinically significant improvement in functional mobility. Baseline: 06/08/23: 47/68 Goal status: INITIAL  2.  Pt will demonstrate full, R shoulder AROM in all planes equal to LUE to demonstrate return to full capacity to complete ADL's.  Baseline: 06/08/23  Active ROM unless otherwise noted Right Eval PROM 11/26 Left eval  Shoulder flexion 81 165  Shoulder extension  65  Shoulder abduction  149  Shoulder adduction    Shoulder internal rotation 55 90  Shoulder external rotation 20 65  Elbow flexion WNL WNL  Elbow extension 0 WNL  Wrist flexion WNL WNL  Wrist extension WNL WNL  Wrist ulnar deviation    Wrist radial deviation    Wrist pronation WNL WNL  Wrist supination WNL WNL  (Blank rows = not tested) Goal status: INITIAL  3.  Pt will be independent with updated HEP program including periscapular and RTC strength/stability related exercises to begin return to ability to complete work related and recreational related activity.   Baseline: 06/08/23:  Goal status: INITIAL  4.  Pt will report return to full work related tasks without strength or other reported functional deficits to demonstrate return to PLOF.  Baseline:  Goal status: INITIAL Target date: 11/28/22 (16 weeks post-op) PLAN:  PT  FREQUENCY: 1-2x/week  PT DURATION: 12 weeks  PLANNED INTERVENTIONS: 97164- PT Re-evaluation, 97110-Therapeutic exercises, 97530- Therapeutic activity, 97112- Neuromuscular re-education, 97535- Self Care, 96045- Manual therapy, 97014- Electrical stimulation (unattended), [phone]- Electrical stimulation (manual), Patient/Family education, Dry Needling, Joint mobilization, Spinal manipulation, Spinal mobilization, Cryotherapy, Moist heat, and Biofeedback  PLAN FOR NEXT SESSION: maintain phase 1 of protocol.    Delphia Grates. Fairly IV, PT, DPT Physical Therapist- [place]  [facility]  06/14/2023, 1:45 PM

## 2023-06-29 ENCOUNTER — Ambulatory Visit: Payer: PRIVATE HEALTH INSURANCE

## 2023-07-01 MED FILL — Montelukast Sodium Tab 10 MG (Base Equiv): ORAL | 90 days supply | Qty: 90 | Fill #3 | Status: AC

## 2023-07-02 ENCOUNTER — Ambulatory Visit: Payer: PRIVATE HEALTH INSURANCE

## 2023-07-02 ENCOUNTER — Other Ambulatory Visit: Payer: Self-pay

## 2023-07-02 DIAGNOSIS — M25511 Pain in right shoulder: Secondary | ICD-10-CM

## 2023-07-02 DIAGNOSIS — M6281 Muscle weakness (generalized): Secondary | ICD-10-CM

## 2023-07-02 NOTE — Therapy (Signed)
OUTPATIENT PHYSICAL THERAPY SHOULDER TREATMENT   Patient Name: Austin Houston MRN: [id number] DOB:10/13/1960, 62 y.o., male Today's Date: 07/02/2023  END OF SESSION:  PT End of Session - 07/02/23 0946     Visit Number 3    Number of Visits 25    Date for PT Re-Evaluation 08/31/23    PT Start Time 0946    PT Stop Time 1016    PT Time Calculation (min) 30 min    Activity Tolerance Patient tolerated treatment well    Behavior During Therapy [facility] for tasks assessed/performed             Past Medical History:  Diagnosis Date   Actinic keratosis    Allergic rhinitis due to pollen    Asthma    vs COPD- taking inhalers- as related to allergies only.   Basal cell carcinoma 03/26/2020   R lat deltoid - ED&C    Dysplastic nevus 03/07/2020   L costal infrapectoral - moderate   Dysplastic nevus 03/07/2020   R post flank above waistline - moderate   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HCC)    s/p partial thyroidectomy (follicular adenoma)-surgery only   GERD (gastroesophageal reflux disease)    Hearing impaired person, bilateral    hearing aida bilateral"high frequency loss   Hyperlipidemia    Hypertension    Prostate cancer (HCC) 01/08/2016   PTSD (post-traumatic stress disorder)    s/p multiple active deployments for [employer], no medications taken currently   Past Surgical History:  Procedure Laterality Date   COLONOSCOPY W/ POLYPECTOMY     age 21 "adenoma"   INGUINAL HERNIA REPAIR     right   LEFT HEART CATH AND CORONARY ANGIOGRAPHY N/A 07/20/2022   Procedure: LEFT HEART CATH AND CORONARY ANGIOGRAPHY;  Surgeon: Tonny Bollman, MD;  [facility] INVASIVE CV LAB;  Service: Cardiovascular;  Laterality: N/A;   REPLACEMENT ASCENDING AORTA N/A 09/07/2022   Procedure: REPLACEMENT ASCENDING AORTA;  Surgeon: Alleen Borne, MD;  Location: [facility] OR;  Service: Open Heart Surgery;  Laterality: N/A;  WITH CIRC ARREST   ROBOT ASSISTED LAPAROSCOPIC RADICAL PROSTATECTOMY N/A 07/23/2016    Procedure: XI ROBOTIC ASSISTED LAPAROSCOPIC RADICAL PROSTATECTOMY LEVEL 1;  Surgeon: Heloise Purpura, MD;  Location: [facility] ORS;  Service: Urology;  Laterality: N/A;   SHOULDER ARTHROSCOPY WITH ROTATOR CUFF REPAIR AND SUBACROMIAL DECOMPRESSION Right 05/27/2023   Procedure: SHOULDER ARTHROSCOPY WITH ROTATOR CUFF REPAIR AND SUBACROMIAL DECOMPRESSION;  Surgeon: Jones Broom, MD;  Location: [facility] ORS;  Service: Orthopedics;  Laterality: Right;  NEEDS 90 MINUTES   TEE WITHOUT CARDIOVERSION N/A 09/07/2022   Procedure: TRANSESOPHAGEAL ECHOCARDIOGRAM (TEE);  Surgeon: Alleen Borne, MD;  Location: [facility] OR;  Service: Open Heart Surgery;  Laterality: N/A;   THYROIDECTOMY, PARTIAL     [facility])   Patient Active Problem List   Diagnosis Date Noted   Anxiety and depression 02/21/2023   Pancytopenia (HCC) 02/21/2023   Elevated liver enzymes 02/21/2023   Alcohol use disorder, severe 02/16/2023   S/P ascending aortic aneurysm repair 09/07/2022   Aneurysm of ascending aorta without rupture (HCC) 07/20/2022   Mild persistent asthma without complication 01/24/2020   Prostate cancer (HCC) 01/08/2016   Varicose veins of bilateral lower extremities with other complications 03/01/2013   Former smokeless tobacco use 11/25/2012   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HCC)    GERD (gastroesophageal reflux disease)    Allergic rhinitis due to pollen    Hypertension    Hyperlipidemia    PTSD (post-traumatic stress disorder)  PCP: Hannah Beat, MD  REFERRING PROVIDER: Berline Lopes  REFERRING DIAG: RIGHT SHOULDER ROTATOR CUFF TEAR   THERAPY DIAG:  Acute pain of right shoulder  Muscle weakness (generalized)  Rationale for Evaluation and Treatment: Rehabilitation  ONSET DATE: 05/27/23  SUBJECTIVE:                                                                                                                                                                                      SUBJECTIVE STATEMENT: Pt  reports no issues with R shoulder. Reports getting sick from his grand kids. Reports maintaining elbow/wrist/hand AROM and PROM of shoulder. Pain well controlled overall. Some discomfort at end ranges of PROM in flexion.  Hand dominance: Right  PERTINENT HISTORY: Pt is a pleasant 62 y.o. male referred to OPPT s/p R RTC tear repair and subacromial decompression 05/27/23. Pt reports having a fall at work in the [employer] onto his R shoulder. Happened December 5th, 2023. Waited for his RTC surgery as he had open heart surgery for ascending aortic aneurysm in February 2024. Pain is minimal, controlled via Tylenol. R handed. Pt works in [employer] lab at [employer] requiring BUE use such as mobilizing patients, transfers, and use of equipment for performing catheterizations.   PAIN:  Are you having pain? Yes: NPRS scale: 0/10 Pain location: Top of shoulder Pain description: Dull, discomfort Aggravating factors: motion Relieving factors: rest  PRECAUTIONS: Other: RTC repair protocol  RED FLAGS: None   WEIGHT BEARING RESTRICTIONS: No  FALLS:  Has patient fallen in last 6 months? No  LIVING ENVIRONMENT: Lives with: lives with their spouse Lives in: House/apartment  OCCUPATION: Cardiovascular Specialist  PLOF: Independent  PATIENT GOALS:Return to occupation, return to PLOF.  NEXT MD VISIT: N/A  OBJECTIVE:  Note: Objective measures were completed at Evaluation unless otherwise noted.  DIAGNOSTIC FINDINGS:  N/A  PATIENT SURVEYS:  FOTO 47/68  COGNITION: Overall cognitive status: Within functional limits for tasks assessed     SENSATION: WFL  POSTURE: Mildly rounded shoulders  CERVICAL AROM: Flexion: 70 Extension: 55 Rotation R/L: 60/70 Lateral flexion R/L: 45/45  UPPER EXTREMITY ROM:   Active ROM unless otherwise noted Right Eval PROM 11/26 Left eval  Shoulder flexion 81 165  Shoulder extension  65  Shoulder abduction  149  Shoulder adduction    Shoulder  internal rotation 55 90  Shoulder external rotation 20 65  Elbow flexion WNL WNL  Elbow extension 0 WNL  Wrist flexion WNL WNL  Wrist extension WNL WNL  Wrist ulnar deviation    Wrist radial deviation    Wrist pronation WNL WNL  Wrist supination WNL WNL  (Blank rows =  not tested)  UPPER EXTREMITY MMT:  MMT Right Eval Left Eval  Shoulder flexion deferred 5  Shoulder extension deferred   Shoulder abduction deferred 5  Shoulder adduction    Shoulder internal rotation deferred 5  Shoulder external rotation deferred 5  Middle trapezius    Lower trapezius    Elbow flexion  5  Elbow extension  4 (seated)  Wrist flexion    Wrist extension    Wrist ulnar deviation    Wrist radial deviation    Wrist pronation    Wrist supination    Grip strength (lbs)    (Blank rows = not tested)  SHOULDER SPECIAL TESTS: Not indicated  JOINT MOBILITY TESTING:  Deferred to next session  PALPATION:  TTP near incision on R shoulder   TODAY'S TREATMENT:                                                                                                                                         DATE: 06/14/23  There.ex: Supine R shoulder PROM: 2x12 to ~115 degrees forward flexion Supine R shoulder ER: maintained to 25-30 degrees painless. 5 reps.  Supine R shoulder very gentle GHJ distraction in scapular plane. Reports painless: 2x15 seconds  Supine R shoulder AP/PA/inferior grade 1-2 mobs, x8 reps/plane 5 sec bouts for pain modulation and gentle join mobility for PROM and prep for AAROM motion overhead.  Supine R shoulder PROM: x12 in scap plane. Reaching 130 degrees passively by end of session.  Educated pt on phase 2 per protocol with focus of next session progressing treatment and HEP for phase 2. Pt understanding. Denies pain post session.  PATIENT EDUCATION: Education details: Protocol, POC, HEP, prognosis Person educated: Patient Education method: Explanation Education comprehension:  verbalized understanding, returned demonstration, verbal cues required, and tactile cues required  HOME EXERCISE PROGRAM: Access Code: [email or web address] URL: [email or web address] Date: 06/08/2023 Prepared by: Ronnie Derby  Exercises - Circular Shoulder Pendulum with Table Support  - 1 x daily - 7 x weekly - 2 sets - 15 reps - Supine Shoulder Flexion PROM  - 1 x daily - 7 x weekly - 2 sets - 15 reps - Supine Shoulder External Rotation and Internal Rotation PROM with Caregiver  - 1 x daily - 7 x weekly - 2 sets - 15 reps - Seated Scapular Retraction  - 1 x daily - 7 x weekly - 3 sets - 15 reps  ASSESSMENT:  CLINICAL IMPRESSION: Pt arriving for treatment with excellent progression and carryover per phase 1 of protocol indicative of good HEP compliance outside of clinic. Pt maintaining R shoulder ER PROM beyond current needs and remains painless. Pt also remains with normalized elbow/wrist/hand AROM in all planes. Encouraged to continue to use as able per protocol. Pt also progressing PROM of L shoulder flexion to 130 degrees. Pt aware he is approaching 6 weeks post-op where phase 2 of protocol can  be initiated. Encouraged to maintain current HEP and progress PROM flexion as able. Pt making excellent progress in restoration of joint mobility and general PROM in ER, scaption/flexion. PT to plan to progress to phase 2 next session. Pt will require and benefit from skilled PT to address his deficits and maximize return of R shoulder AROM and strength to PLOF.   OBJECTIVE IMPAIRMENTS: decreased mobility, decreased ROM, decreased strength, impaired UE functional use, postural dysfunction, and pain.   ACTIVITY LIMITATIONS: carrying, lifting, bathing, toileting, dressing, reach over head, and hygiene/grooming  PARTICIPATION LIMITATIONS: cleaning, community activity, occupation, and yard work  PERSONAL FACTORS: Age, Education, Fitness, Past/current experiences, Profession, and Time since onset of  injury/illness/exacerbation are also affecting patient's functional outcome.   REHAB POTENTIAL: Good  CLINICAL DECISION MAKING: Stable/uncomplicated  EVALUATION COMPLEXITY: Low   GOALS: Goals reviewed with patient? No  SHORT TERM GOALS: Target date: 07/06/23 unless otherwise posted below  Pt will be independent with HEP to improve R shoulder AROM and strength within protocol parameters to improve functional mobility.  Baseline: 06/08/23: Provided initial HEP; 07/02/23: Independent with HEP progressing PROM.   Goal status: IN PROGRESS  2.  Pt will demonstrate R shoulder AROM in flexion to at least 120 degrees in supine for gradual return to overhead ADL completion. Baseline: 06/08/23: Phase 1 of current protocol (PROM) Goal status: INITIAL Target Date: 07/21/22 (8 weeks post op)  LONG TERM GOALS: Target date: 08/31/23 unless otherwise posted below  Pt will improve FOTO to target score to demonstrate clinically significant improvement in functional mobility. Baseline: 06/08/23: 47/68 Goal status: INITIAL  2.  Pt will demonstrate full, R shoulder AROM in all planes equal to LUE to demonstrate return to full capacity to complete ADL's.  Baseline: 06/08/23  Active ROM unless otherwise noted Right Eval PROM 11/26 Left eval  Shoulder flexion 81 165  Shoulder extension  65  Shoulder abduction  149  Shoulder adduction    Shoulder internal rotation 55 90  Shoulder external rotation 20 65  Elbow flexion WNL WNL  Elbow extension 0 WNL  Wrist flexion WNL WNL  Wrist extension WNL WNL  Wrist ulnar deviation    Wrist radial deviation    Wrist pronation WNL WNL  Wrist supination WNL WNL  (Blank rows = not tested) Goal status: INITIAL  3.  Pt will be independent with updated HEP program including periscapular and RTC strength/stability related exercises to begin return to ability to complete work related and recreational related activity.   Baseline: 06/08/23:  Goal status:  INITIAL  4.  Pt will report return to full work related tasks without strength or other reported functional deficits to demonstrate return to PLOF.  Baseline:  Goal status: INITIAL Target date: 11/28/22 (16 weeks post-op) PLAN:  PT FREQUENCY: 1-2x/week  PT DURATION: 12 weeks  PLANNED INTERVENTIONS: 97164- PT Re-evaluation, 97110-Therapeutic exercises, 97530- Therapeutic activity, 97112- Neuromuscular re-education, 97535- Self Care, 16109- Manual therapy, 97014- Electrical stimulation (unattended), [phone]- Electrical stimulation (manual), Patient/Family education, Dry Needling, Joint mobilization, Spinal manipulation, Spinal mobilization, Cryotherapy, Moist heat, and Biofeedback  PLAN FOR NEXT SESSION: Progress to phase 2 of protocol. Continue to progress PROM to end ranges of flexion.   Delphia Grates. Fairly IV, PT, DPT Physical Therapist- [place]  [facility]  07/02/2023, 10:24 AM

## 2023-07-06 DIAGNOSIS — H903 Sensorineural hearing loss, bilateral: Secondary | ICD-10-CM | POA: Diagnosis not present

## 2023-07-09 ENCOUNTER — Ambulatory Visit: Payer: PRIVATE HEALTH INSURANCE

## 2023-07-09 DIAGNOSIS — M25511 Pain in right shoulder: Secondary | ICD-10-CM

## 2023-07-09 DIAGNOSIS — M6281 Muscle weakness (generalized): Secondary | ICD-10-CM

## 2023-07-09 NOTE — Therapy (Signed)
OUTPATIENT PHYSICAL THERAPY SHOULDER TREATMENT   Patient Name: Austin Houston MRN: [id number] DOB:03/24/1961, 62 y.o., male Today's Date: 07/09/2023  END OF SESSION:  PT End of Session - 07/09/23 1037     Visit Number 4    Number of Visits 25    Date for PT Re-Evaluation 08/31/23    Authorization Type [facility]    Progress Note Due on Visit 10    PT Start Time 0945    PT Stop Time 1025    PT Time Calculation (min) 40 min    Activity Tolerance Patient tolerated treatment well;Patient limited by fatigue;Patient limited by pain    Behavior During Therapy [facility] for tasks assessed/performed             Past Medical History:  Diagnosis Date   Actinic keratosis    Allergic rhinitis due to pollen    Asthma    vs COPD- taking inhalers- as related to allergies only.   Basal cell carcinoma 03/26/2020   R lat deltoid - ED&C    Dysplastic nevus 03/07/2020   L costal infrapectoral - moderate   Dysplastic nevus 03/07/2020   R post flank above waistline - moderate   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HCC)    s/p partial thyroidectomy (follicular adenoma)-surgery only   GERD (gastroesophageal reflux disease)    Hearing impaired person, bilateral    hearing aida bilateral"high frequency loss   Hyperlipidemia    Hypertension    Prostate cancer (HCC) 01/08/2016   PTSD (post-traumatic stress disorder)    s/p multiple active deployments for [employer], no medications taken currently   Past Surgical History:  Procedure Laterality Date   COLONOSCOPY W/ POLYPECTOMY     age 89 "adenoma"   INGUINAL HERNIA REPAIR     right   LEFT HEART CATH AND CORONARY ANGIOGRAPHY N/A 07/20/2022   Procedure: LEFT HEART CATH AND CORONARY ANGIOGRAPHY;  Surgeon: Tonny Bollman, MD;  Location: [facility] INVASIVE CV LAB;  Service: Cardiovascular;  Laterality: N/A;   REPLACEMENT ASCENDING AORTA N/A 09/07/2022   Procedure: REPLACEMENT ASCENDING AORTA;  Surgeon: Alleen Borne, MD;  Location: [facility] OR;   Service: Open Heart Surgery;  Laterality: N/A;  WITH CIRC ARREST   ROBOT ASSISTED LAPAROSCOPIC RADICAL PROSTATECTOMY N/A 07/23/2016   Procedure: XI ROBOTIC ASSISTED LAPAROSCOPIC RADICAL PROSTATECTOMY LEVEL 1;  Surgeon: Heloise Purpura, MD;  Location: [facility] ORS;  Service: Urology;  Laterality: N/A;   SHOULDER ARTHROSCOPY WITH ROTATOR CUFF REPAIR AND SUBACROMIAL DECOMPRESSION Right 05/27/2023   Procedure: SHOULDER ARTHROSCOPY WITH ROTATOR CUFF REPAIR AND SUBACROMIAL DECOMPRESSION;  Surgeon: Jones Broom, MD;  Location: [facility] ORS;  Service: Orthopedics;  Laterality: Right;  NEEDS 90 MINUTES   TEE WITHOUT CARDIOVERSION N/A 09/07/2022   Procedure: TRANSESOPHAGEAL ECHOCARDIOGRAM (TEE);  Surgeon: Alleen Borne, MD;  Location: [facility] OR;  Service: Open Heart Surgery;  Laterality: N/A;   THYROIDECTOMY, PARTIAL     [facility])   Patient Active Problem List   Diagnosis Date Noted   Anxiety and depression 02/21/2023   Pancytopenia (HCC) 02/21/2023   Elevated liver enzymes 02/21/2023   Alcohol use disorder, severe 02/16/2023   S/P ascending aortic aneurysm repair 09/07/2022   Aneurysm of ascending aorta without rupture (HCC) 07/20/2022   Mild persistent asthma without complication 01/24/2020   Prostate cancer (HCC) 01/08/2016   Varicose veins of bilateral lower extremities with other complications 03/01/2013   Former smokeless tobacco use 11/25/2012   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HCC)    GERD (gastroesophageal reflux  disease)    Allergic rhinitis due to pollen    Hypertension    Hyperlipidemia    PTSD (post-traumatic stress disorder)     PCP: Hannah Beat, MD  REFERRING PROVIDER: Berline Lopes  REFERRING DIAG: RIGHT SHOULDER ROTATOR CUFF TEAR   THERAPY DIAG:  Acute pain of right shoulder  Muscle weakness (generalized)  Rationale for Evaluation and Treatment: Rehabilitation  ONSET DATE: 05/27/23  SUBJECTIVE:                                                                                                                                                                                       SUBJECTIVE STATEMENT: HEP going well. Pt cautious opening holiday gifts using an adaptive 2-hand strategy that was adherent to postsurgical guidelines. Pt reports pain to be at goal, took 2 tylenol before arriving. Wife has been assisting with ROM assignments.   Hand dominance: Right  PERTINENT HISTORY: Pt is a pleasant 62 y.o. male referred to OPPT s/p R RTC tear repair and subacromial decompression 05/27/23. Pt reports having a fall at work in the [employer] onto his R shoulder. Happened December 5th, 2023. Waited for his RTC surgery as he had open heart surgery for ascending aortic aneurysm in February 2024. Pain is minimal, controlled via Tylenol. R handed. Pt works in [employer] lab at [employer] requiring BUE use such as mobilizing patients, transfers, and use of equipment for performing catheterizations.   PAIN:  Are you having pain? None on arrival; noted soreness during end-range stretches and soreness at end of session.   PRECAUTIONS: Other: RTC repair protocol  Weeks Six-Twelve Precautions: -no lifting -no supporting BW with hands and arms -no sudden jerking -no excessive behind the back movements -avoid upper extremity ergometer    RED FLAGS: None   WEIGHT BEARING RESTRICTIONS: No  FALLS:  Has patient fallen in last 6 months? No  LIVING ENVIRONMENT: Lives with: lives with their spouse Lives in: House/apartment  OCCUPATION: Cardiovascular Specialist  PLOF: Independent  PATIENT GOALS:Return to occupation, return to PLOF.  NEXT MD VISIT: N/A  OBJECTIVE:  Note: Objective measures were completed at Evaluation unless otherwise noted.  DIAGNOSTIC FINDINGS:  N/A  PATIENT SURVEYS:  FOTO 47/68  COGNITION: Overall cognitive status: Within functional limits for tasks assessed     SENSATION: WFL  POSTURE: Mildly rounded shoulders  CERVICAL  AROM: Flexion: 70 Extension: 55 Rotation R/L: 60/70 Lateral flexion R/L: 45/45  UPPER EXTREMITY ROM:   Active ROM unless otherwise noted Right Eval PROM 11/26 Left eval  Shoulder flexion 81 165  Shoulder extension  65  Shoulder abduction  149  Shoulder adduction  Shoulder internal rotation 55 90  Shoulder external rotation 20 65  Elbow flexion WNL WNL  Elbow extension 0 WNL  Wrist flexion WNL WNL  Wrist extension WNL WNL  Wrist ulnar deviation    Wrist radial deviation    Wrist pronation WNL WNL  Wrist supination WNL WNL  (Blank rows = not tested)  UPPER EXTREMITY MMT:  MMT Right Eval Left Eval  Shoulder flexion deferred 5  Shoulder extension deferred   Shoulder abduction deferred 5  Shoulder adduction    Shoulder internal rotation deferred 5  Shoulder external rotation deferred 5  Middle trapezius    Lower trapezius    Elbow flexion  5  Elbow extension  4 (seated)   TODAY'S TREATMENT:                                                                                                                                         DATE: 07/09/23  Today, patient is 6 weeks 1 days postop . -seated BUE shouler flexion table slides x15 (~125 degrees easily achieved)  -seated RUE shoulder ABDCT table slides x15 (~105 degrees with end range pain) -seated RUE shoulder ER table slides x15 (>60 degrees easily achieved)  *author encouraged pt to take it easy on these given his advanced ROM and continue to be respectful of healing timelines  -prone shoulder extension to neutral x10  (performed in standing, similar to prior pendulum setup)  -supine wand flexion AA/ROM c PVC x10 ~146 degrees with low ambition/effort  -isometric shoulder adduction RUE 10x3secH c towel roll -isometric shoulder ABDCT RUE into doorframe 10x3secH  -isometric BUE shoulder ER using a pillowcase in supinated grip x5 for teaching, then x5 using doorframe (HEP version) -isometric RUE shoulder flexion into  doorframe 10x3secH  -isometric BUE shoulder IR using PVC in supinated grip x5 for teaching, then x5 using doorframe (HEP version)  -review of updated HEP handout and time taken to answer all questions    PATIENT EDUCATION: Education details: Updated precautions/restrictions/HEP per phase 2 protocol Person educated: Patient Education method: Explanation Education comprehension: verbalized understanding, returned demonstration, verbal cues required, and tactile cues required  HOME EXERCISE PROGRAM: Access Code: [email or web address] URL: [email or web address] Date: 07/09/2023 Prepared by: Alvera Novel  Exercises - Seated Scapular Retraction  - 3 x daily - 1 sets - 15 reps - Seated Shoulder Shrugs  - 3 x daily - 1 sets - 15 reps - Seated Shoulder Flexion Towel Slide at Table Top  - 3 x daily - 1 sets - 15-20 reps - Seated Shoulder Abduction Towel Slide at Table Top  - 1 x daily - 1 sets - 15-20 reps - Prone Shoulder Row  - 3 x daily - 1 sets - 10 reps - Isometric Shoulder Adduction  - 3 x daily - 1 sets - 10 reps - 3 sec hold - Isometric Shoulder Abduction at Wall  - 3 x  daily - 1 sets - 10 reps - 3 sec hold - Standing Isometric Shoulder External Rotation with Doorway  - 3 x daily - 1 sets - 10 reps - 3 sec hold - Standing Isometric Shoulder Flexion with Doorway - Arm Bent  - 3 x daily - 1 sets - 10 reps - 3 sec hold - Standing Isometric Shoulder Internal Rotation at Doorway  - 3 x daily - 1 sets - 10 reps - 3 sec hold  ASSESSMENT:  CLINICAL IMPRESSION: Pain at goal. ROM beyond what is typical in all directions. Advanced clinic and home programs based on surgical protocol- this visit marks 6 weeks 1 days postop. Pt sore and fatigues at end of session. Pt will require and benefit from skilled PT to address his deficits and maximize return of R shoulder AROM and strength to PLOF.   OBJECTIVE IMPAIRMENTS: decreased mobility, decreased ROM, decreased strength, impaired UE functional  use, postural dysfunction, and pain.   ACTIVITY LIMITATIONS: carrying, lifting, bathing, toileting, dressing, reach over head, and hygiene/grooming  PARTICIPATION LIMITATIONS: cleaning, community activity, occupation, and yard work  PERSONAL FACTORS: Age, Education, Fitness, Past/current experiences, Profession, and Time since onset of injury/illness/exacerbation are also affecting patient's functional outcome.   REHAB POTENTIAL: Good  CLINICAL DECISION MAKING: Stable/uncomplicated  EVALUATION COMPLEXITY: Low   GOALS: Goals reviewed with patient? No  SHORT TERM GOALS: Target date: 07/06/23 unless otherwise posted below  Pt will be independent with HEP to improve R shoulder AROM and strength within protocol parameters to improve functional mobility.  Baseline: 06/08/23: Provided initial HEP; 07/02/23: Independent with HEP progressing PROM.   Goal status: IN PROGRESS  2.  Pt will demonstrate R shoulder AROM in flexion to at least 120 degrees in supine for gradual return to overhead ADL completion. Baseline: 06/08/23: Phase 1 of current protocol (PROM) Goal status: INITIAL Target Date: 07/21/22 (8 weeks post op)  LONG TERM GOALS: Target date: 08/31/23 unless otherwise posted below  Pt will improve FOTO to target score to demonstrate clinically significant improvement in functional mobility. Baseline: 06/08/23: 47/68 Goal status: INITIAL  2.  Pt will demonstrate full, R shoulder AROM in all planes equal to LUE to demonstrate return to full capacity to complete ADL's.  Baseline: 06/08/23  Active ROM unless otherwise noted Right Eval PROM 11/26 Left eval  Shoulder flexion 81 165  Shoulder extension  65  Shoulder abduction  149  Shoulder adduction    Shoulder internal rotation 55 90  Shoulder external rotation 20 65  Elbow flexion WNL WNL  Elbow extension 0 WNL  Wrist flexion WNL WNL  Wrist extension WNL WNL  Wrist ulnar deviation    Wrist radial deviation    Wrist  pronation WNL WNL  Wrist supination WNL WNL  (Blank rows = not tested) Goal status: INITIAL  3.  Pt will be independent with updated HEP program including periscapular and RTC strength/stability related exercises to begin return to ability to complete work related and recreational related activity.   Baseline: 06/08/23:  Goal status: INITIAL  4.  Pt will report return to full work related tasks without strength or other reported functional deficits to demonstrate return to PLOF.  Baseline:  Goal status: INITIAL Target date: 11/28/22 (16 weeks post-op) PLAN:  PT FREQUENCY: 1-2x/week  PT DURATION: 12 weeks  PLANNED INTERVENTIONS: 97164- PT Re-evaluation, 97110-Therapeutic exercises, 97530- Therapeutic activity, 97112- Neuromuscular re-education, 97535- Self Care, 16109- Manual therapy, 97014- Electrical stimulation (unattended), [id number]- Electrical stimulation (manual), Patient/Family education, Dry Needling, Joint  mobilization, Spinal manipulation, Spinal mobilization, Cryotherapy, Moist heat, and Biofeedback  PLAN FOR NEXT SESSION: FU on response to addition of new HEP activities for Phase 2  10:39 AM, 07/09/23 Rosamaria Lints, PT, DPT Physical Therapist - [employer] [phone] (Office)

## 2023-07-12 ENCOUNTER — Ambulatory Visit: Payer: PRIVATE HEALTH INSURANCE

## 2023-07-12 DIAGNOSIS — M25511 Pain in right shoulder: Secondary | ICD-10-CM

## 2023-07-12 DIAGNOSIS — M6281 Muscle weakness (generalized): Secondary | ICD-10-CM

## 2023-07-12 NOTE — Therapy (Signed)
OUTPATIENT PHYSICAL THERAPY SHOULDER TREATMENT   Patient Name: Austin Houston MRN: [id number] DOB:September 10, 1960, 62 y.o., male Today's Date: 07/12/2023  END OF SESSION:  PT End of Session - 07/12/23 1517     Visit Number 5    Number of Visits 25    Date for PT Re-Evaluation 08/31/23    Authorization Type [facility]    Progress Note Due on Visit 10    PT Start Time 1517    PT Stop Time 1545    PT Time Calculation (min) 28 min    Activity Tolerance Patient tolerated treatment well    Behavior During Therapy WFL for tasks assessed/performed             Past Medical History:  Diagnosis Date   Actinic keratosis    Allergic rhinitis due to pollen    Asthma    vs COPD- taking inhalers- as related to allergies only.   Basal cell carcinoma 03/26/2020   R lat deltoid - ED&C    Dysplastic nevus 03/07/2020   L costal infrapectoral - moderate   Dysplastic nevus 03/07/2020   R post flank above waistline - moderate   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HCC)    s/p partial thyroidectomy (follicular adenoma)-surgery only   GERD (gastroesophageal reflux disease)    Hearing impaired person, bilateral    hearing aida bilateral"high frequency loss   Hyperlipidemia    Hypertension    Prostate cancer (HCC) 01/08/2016   PTSD (post-traumatic stress disorder)    s/p multiple active deployments for [employer], no medications taken currently   Past Surgical History:  Procedure Laterality Date   COLONOSCOPY W/ POLYPECTOMY     age 45 "adenoma"   INGUINAL HERNIA REPAIR     right   LEFT HEART CATH AND CORONARY ANGIOGRAPHY N/A 07/20/2022   Procedure: LEFT HEART CATH AND CORONARY ANGIOGRAPHY;  Surgeon: Tonny Bollman, MD;  Location: [facility] INVASIVE CV LAB;  Service: Cardiovascular;  Laterality: N/A;   REPLACEMENT ASCENDING AORTA N/A 09/07/2022   Procedure: REPLACEMENT ASCENDING AORTA;  Surgeon: Alleen Borne, MD;  Location: [facility] OR;  Service: Open Heart Surgery;  Laterality: N/A;  WITH  CIRC ARREST   ROBOT ASSISTED LAPAROSCOPIC RADICAL PROSTATECTOMY N/A 07/23/2016   Procedure: XI ROBOTIC ASSISTED LAPAROSCOPIC RADICAL PROSTATECTOMY LEVEL 1;  Surgeon: Heloise Purpura, MD;  Location: [facility] ORS;  Service: Urology;  Laterality: N/A;   SHOULDER ARTHROSCOPY WITH ROTATOR CUFF REPAIR AND SUBACROMIAL DECOMPRESSION Right 05/27/2023   Procedure: SHOULDER ARTHROSCOPY WITH ROTATOR CUFF REPAIR AND SUBACROMIAL DECOMPRESSION;  Surgeon: Jones Broom, MD;  Location: [facility] ORS;  Service: Orthopedics;  Laterality: Right;  NEEDS 90 MINUTES   TEE WITHOUT CARDIOVERSION N/A 09/07/2022   Procedure: TRANSESOPHAGEAL ECHOCARDIOGRAM (TEE);  Surgeon: Alleen Borne, MD;  Location: [facility] OR;  Service: Open Heart Surgery;  Laterality: N/A;   THYROIDECTOMY, PARTIAL     [facility])   Patient Active Problem List   Diagnosis Date Noted   Anxiety and depression 02/21/2023   Pancytopenia (HCC) 02/21/2023   Elevated liver enzymes 02/21/2023   Alcohol use disorder, severe 02/16/2023   S/P ascending aortic aneurysm repair 09/07/2022   Aneurysm of ascending aorta without rupture (HCC) 07/20/2022   Mild persistent asthma without complication 01/24/2020   Prostate cancer (HCC) 01/08/2016   Varicose veins of bilateral lower extremities with other complications 03/01/2013   Former smokeless tobacco use 11/25/2012   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HCC)    GERD (gastroesophageal reflux disease)    Allergic rhinitis  due to pollen    Hypertension    Hyperlipidemia    PTSD (post-traumatic stress disorder)     PCP: Hannah Beat, MD  REFERRING PROVIDER: Berline Lopes  REFERRING DIAG: RIGHT SHOULDER ROTATOR CUFF TEAR   THERAPY DIAG:  Acute pain of right shoulder  Muscle weakness (generalized)  Rationale for Evaluation and Treatment: Rehabilitation  ONSET DATE: 05/27/23  SUBJECTIVE:                                                                                                                                                                                       SUBJECTIVE STATEMENT: HEP going well. Has good understanding of updates for phase 2 of protocol.  Hand dominance: Right  PERTINENT HISTORY: Pt is a pleasant 62 y.o. male referred to OPPT s/p R RTC tear repair and subacromial decompression 05/27/23. Pt reports having a fall at work in the [employer] onto his R shoulder. Happened December 5th, 2023. Waited for his RTC surgery as he had open heart surgery for ascending aortic aneurysm in February 2024. Pain is minimal, controlled via Tylenol. R handed. Pt works in [employer] lab at [employer] requiring BUE use such as mobilizing patients, transfers, and use of equipment for performing catheterizations.   PAIN:  Are you having pain? None on arrival; noted soreness during end-range stretches and soreness at end of session.   PRECAUTIONS: Other: RTC repair protocol  Weeks Six-Twelve Precautions: -no lifting -no supporting BW with hands and arms -no sudden jerking -no excessive behind the back movements -avoid upper extremity ergometer    RED FLAGS: None   WEIGHT BEARING RESTRICTIONS: No  FALLS:  Has patient fallen in last 6 months? No  LIVING ENVIRONMENT: Lives with: lives with their spouse Lives in: House/apartment  OCCUPATION: Cardiovascular Specialist  PLOF: Independent  PATIENT GOALS:Return to occupation, return to PLOF.  NEXT MD VISIT: N/A  OBJECTIVE:  Note: Objective measures were completed at Evaluation unless otherwise noted.  DIAGNOSTIC FINDINGS:  N/A  PATIENT SURVEYS:  FOTO 47/68  COGNITION: Overall cognitive status: Within functional limits for tasks assessed     SENSATION: WFL  POSTURE: Mildly rounded shoulders  CERVICAL AROM: Flexion: 70 Extension: 55 Rotation R/L: 60/70 Lateral flexion R/L: 45/45  UPPER EXTREMITY ROM:   Active ROM unless otherwise noted Right Eval PROM 11/26 Left eval  Shoulder flexion 81 165  Shoulder extension   65  Shoulder abduction  149  Shoulder adduction    Shoulder internal rotation 55 90  Shoulder external rotation 20 65  Elbow flexion WNL WNL  Elbow extension 0 WNL  Wrist flexion WNL WNL  Wrist extension WNL WNL  Wrist ulnar deviation    Wrist radial deviation    Wrist pronation WNL WNL  Wrist supination WNL WNL  (Blank rows = not tested)  UPPER EXTREMITY MMT:  MMT Right Eval Left Eval  Shoulder flexion deferred 5  Shoulder extension deferred   Shoulder abduction deferred 5  Shoulder adduction    Shoulder internal rotation deferred 5  Shoulder external rotation deferred 5  Middle trapezius    Lower trapezius    Elbow flexion  5  Elbow extension  4 (seated)   TODAY'S TREATMENT:                                                                                                                                         DATE: 07/12/23   There.ex:  Seated R shoulder flexion/scaption AAROM table slides: x15 Seated R shoulder abduction AAROM table slides: x15 Seated R shoulder ER AAROM with cane: x15 Reviewed R shoulder isometrics: flexion, abd, add. Reviewed reps/sets/frequency. 1x10 secs/plane today.  Discussion being ahead of schedule functionally per protocol. Encouraged to maintain current HEP to comfortable ranges and abide by surgeon recommendations per tissue healing guidelines.    PATIENT EDUCATION: Education details: Updated precautions/restrictions/HEP per phase 2 protocol Person educated: Patient Education method: Explanation Education comprehension: verbalized understanding, returned demonstration, verbal cues required, and tactile cues required  HOME EXERCISE PROGRAM: Access Code: [email or web address] URL: [email or web address] Date: 07/09/2023 Prepared by: Alvera Novel  Exercises - Seated Scapular Retraction  - 3 x daily - 1 sets - 15 reps - Seated Shoulder Shrugs  - 3 x daily - 1 sets - 15 reps - Seated Shoulder Flexion Towel Slide at Table Top  - 3 x  daily - 1 sets - 15-20 reps - Seated Shoulder Abduction Towel Slide at Table Top  - 1 x daily - 1 sets - 15-20 reps - Prone Shoulder Row  - 3 x daily - 1 sets - 10 reps - Isometric Shoulder Adduction  - 3 x daily - 1 sets - 10 reps - 3 sec hold - Isometric Shoulder Abduction at Wall  - 3 x daily - 1 sets - 10 reps - 3 sec hold - Standing Isometric Shoulder External Rotation with Doorway  - 3 x daily - 1 sets - 10 reps - 3 sec hold - Standing Isometric Shoulder Flexion with Doorway - Arm Bent  - 3 x daily - 1 sets - 10 reps - 3 sec hold - Standing Isometric Shoulder Internal Rotation at Doorway  - 3 x daily - 1 sets - 10 reps - 3 sec hold  ASSESSMENT:  CLINICAL IMPRESSION: Pt with excellent understanding of phase 2 of protocol and HEP. Pt subjectively reports understanding of home program and current restrictions. Will continue 1x/week for phase 2 of recovery to ensure progression of PROM and AAROM to tolerance. Will initiate 2x/week when pt is able to begin with AROM and addition  of resistance training. Pt in agreement and comfortable with PT POC. Pt will require and benefit from skilled PT to address his deficits and maximize return of R shoulder AROM and strength to PLOF.   OBJECTIVE IMPAIRMENTS: decreased mobility, decreased ROM, decreased strength, impaired UE functional use, postural dysfunction, and pain.   ACTIVITY LIMITATIONS: carrying, lifting, bathing, toileting, dressing, reach over head, and hygiene/grooming  PARTICIPATION LIMITATIONS: cleaning, community activity, occupation, and yard work  PERSONAL FACTORS: Age, Education, Fitness, Past/current experiences, Profession, and Time since onset of injury/illness/exacerbation are also affecting patient's functional outcome.   REHAB POTENTIAL: Good  CLINICAL DECISION MAKING: Stable/uncomplicated  EVALUATION COMPLEXITY: Low   GOALS: Goals reviewed with patient? No  SHORT TERM GOALS: Target date: 07/06/23 unless otherwise posted  below  Pt will be independent with HEP to improve R shoulder AROM and strength within protocol parameters to improve functional mobility.  Baseline: 06/08/23: Provided initial HEP; 07/02/23: Independent with HEP progressing PROM.   Goal status: IN PROGRESS  2.  Pt will demonstrate R shoulder AROM in flexion to at least 120 degrees in supine for gradual return to overhead ADL completion. Baseline: 06/08/23: Phase 1 of current protocol (PROM) Goal status: INITIAL Target Date: 07/21/22 (8 weeks post op)  LONG TERM GOALS: Target date: 08/31/23 unless otherwise posted below  Pt will improve FOTO to target score to demonstrate clinically significant improvement in functional mobility. Baseline: 06/08/23: 47/68 Goal status: INITIAL  2.  Pt will demonstrate full, R shoulder AROM in all planes equal to LUE to demonstrate return to full capacity to complete ADL's.  Baseline: 06/08/23  Active ROM unless otherwise noted Right Eval PROM 11/26 Left eval  Shoulder flexion 81 165  Shoulder extension  65  Shoulder abduction  149  Shoulder adduction    Shoulder internal rotation 55 90  Shoulder external rotation 20 65  Elbow flexion WNL WNL  Elbow extension 0 WNL  Wrist flexion WNL WNL  Wrist extension WNL WNL  Wrist ulnar deviation    Wrist radial deviation    Wrist pronation WNL WNL  Wrist supination WNL WNL  (Blank rows = not tested) Goal status: INITIAL  3.  Pt will be independent with updated HEP program including periscapular and RTC strength/stability related exercises to begin return to ability to complete work related and recreational related activity.   Baseline: 06/08/23:  Goal status: INITIAL  4.  Pt will report return to full work related tasks without strength or other reported functional deficits to demonstrate return to PLOF.  Baseline:  Goal status: INITIAL Target date: 11/28/22 (16 weeks post-op) PLAN:  PT FREQUENCY: 1-2x/week  PT DURATION: 12 weeks  PLANNED  INTERVENTIONS: 97164- PT Re-evaluation, 97110-Therapeutic exercises, 97530- Therapeutic activity, 97112- Neuromuscular re-education, 97535- Self Care, 16109- Manual therapy, 97014- Electrical stimulation (unattended), [phone]- Electrical stimulation (manual), Patient/Family education, Dry Needling, Joint mobilization, Spinal manipulation, Spinal mobilization, Cryotherapy, Moist heat, and Biofeedback  PLAN FOR NEXT SESSION: Phase 2 of protocol   Delphia Grates. Fairly IV, PT, DPT Physical Therapist- [place]  [facility]  4:00 PM, 07/12/23

## 2023-07-15 ENCOUNTER — Ambulatory Visit: Payer: PRIVATE HEALTH INSURANCE

## 2023-07-19 ENCOUNTER — Ambulatory Visit: Payer: PRIVATE HEALTH INSURANCE | Attending: Orthopedic Surgery

## 2023-07-19 DIAGNOSIS — M6281 Muscle weakness (generalized): Secondary | ICD-10-CM | POA: Insufficient documentation

## 2023-07-19 DIAGNOSIS — M25511 Pain in right shoulder: Secondary | ICD-10-CM | POA: Insufficient documentation

## 2023-07-19 NOTE — Therapy (Signed)
 OUTPATIENT PHYSICAL THERAPY SHOULDER TREATMENT   Patient Name: Austin Houston MRN: [id number] DOB:Jun 26, 1961, 63 y.o., male Today's Date: 07/19/2023  END OF SESSION:  PT End of Session - 07/19/23 1339     Visit Number 6    Number of Visits 25    Date for PT Re-Evaluation 08/31/23    Authorization Type [employer] [facility]    Progress Note Due on Visit 10    PT Start Time 1340    PT Stop Time 1423    PT Time Calculation (min) 43 min    Activity Tolerance Patient tolerated treatment well    Behavior During Therapy WFL for tasks assessed/performed             Past Medical History:  Diagnosis Date   Actinic keratosis    Allergic rhinitis due to pollen    Asthma    vs COPD- taking inhalers- as related to allergies only.   Basal cell carcinoma 03/26/2020   R lat deltoid - ED&C    Dysplastic nevus 03/07/2020   L costal infrapectoral - moderate   Dysplastic nevus 03/07/2020   R post flank above waistline - moderate   Follicular cancer of thyroid  (HCC)    s/p partial thyroidectomy (follicular adenoma)-surgery only   GERD (gastroesophageal reflux disease)    Hearing impaired person, bilateral    hearing aida bilateralhigh frequency loss   Hyperlipidemia    Hypertension    Prostate cancer (HCC) 01/08/2016   PTSD (post-traumatic stress disorder)    s/p multiple active deployments for [employer], no medications taken currently   Past Surgical History:  Procedure Laterality Date   COLONOSCOPY W/ POLYPECTOMY     age 42 adenoma   INGUINAL HERNIA REPAIR     right   LEFT HEART CATH AND CORONARY ANGIOGRAPHY N/A 07/20/2022   Procedure: LEFT HEART CATH AND CORONARY ANGIOGRAPHY;  Surgeon: Wonda Sharper, MD;  Location: [facility] INVASIVE CV LAB;  Service: Cardiovascular;  Laterality: N/A;   REPLACEMENT ASCENDING AORTA N/A 09/07/2022   Procedure: REPLACEMENT ASCENDING AORTA;  Surgeon: Lucas Dorise POUR, MD;  Location: [facility] OR;  Service: Open Heart Surgery;  Laterality: N/A;  WITH  CIRC ARREST   ROBOT ASSISTED LAPAROSCOPIC RADICAL PROSTATECTOMY N/A 07/23/2016   Procedure: XI ROBOTIC ASSISTED LAPAROSCOPIC RADICAL PROSTATECTOMY LEVEL 1;  Surgeon: Gretel Ferrara, MD;  Location: [facility] ORS;  Service: Urology;  Laterality: N/A;   SHOULDER ARTHROSCOPY WITH ROTATOR CUFF REPAIR AND SUBACROMIAL DECOMPRESSION Right 05/27/2023   Procedure: SHOULDER ARTHROSCOPY WITH ROTATOR CUFF REPAIR AND SUBACROMIAL DECOMPRESSION;  Surgeon: Dozier Soulier, MD;  Location: [facility] ORS;  Service: Orthopedics;  Laterality: Right;  NEEDS 90 MINUTES   TEE WITHOUT CARDIOVERSION N/A 09/07/2022   Procedure: TRANSESOPHAGEAL ECHOCARDIOGRAM (TEE);  Surgeon: Lucas Dorise POUR, MD;  Location: [facility] OR;  Service: Open Heart Surgery;  Laterality: N/A;   THYROIDECTOMY, PARTIAL     [facility])   Patient Active Problem List   Diagnosis Date Noted   Anxiety and depression 02/21/2023   Pancytopenia (HCC) 02/21/2023   Elevated liver enzymes 02/21/2023   Alcohol  use disorder, severe 02/16/2023   S/P ascending aortic aneurysm repair 09/07/2022   Aneurysm of ascending aorta without rupture (HCC) 07/20/2022   Mild persistent asthma without complication 01/24/2020   Prostate cancer (HCC) 01/08/2016   Varicose veins of bilateral lower extremities with other complications 03/01/2013   Former smokeless tobacco use 11/25/2012   Follicular cancer of thyroid  (HCC)    GERD (gastroesophageal reflux disease)    Allergic rhinitis  due to pollen    Hypertension    Hyperlipidemia    PTSD (post-traumatic stress disorder)     PCP: Watt Mirza, MD  REFERRING PROVIDER: Eva MICAEL Herring  REFERRING DIAG: RIGHT SHOULDER ROTATOR CUFF TEAR   THERAPY DIAG:  Acute pain of right shoulder  Muscle weakness (generalized)  Rationale for Evaluation and Treatment: Rehabilitation  ONSET DATE: 05/27/23  SUBJECTIVE:                                                                                                                                                                                       SUBJECTIVE STATEMENT: Reports HEP going well at home. Noticed 3 days ago he has felt limited in his AAROM flexion in overhead ranges compared to his typical. States he has been doing it seated or standing and had some mild pain at end ranges like an ice pick. Denies any swelling or major loss of function.  Hand dominance: Right  PERTINENT HISTORY: Pt is a pleasant 63 y.o. male referred to OPPT s/p R RTC tear repair and subacromial decompression 05/27/23. 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 Happened December 5th, 2023. Waited for his RTC surgery as he had open heart surgery for ascending aortic aneurysm in February 2024. Pain is minimal, controlled via Tylenol . R handed. Pt works in [employer] lab at [employer] requiring BUE use such as mobilizing patients, transfers, and use of equipment for performing catheterizations.   PAIN:  Are you having pain? None on arrival; noted soreness during end-range stretches and soreness at end of session.   PRECAUTIONS: Other: RTC repair protocol  Weeks Six-Twelve Precautions: -no lifting -no supporting BW with hands and arms -no sudden jerking -no excessive behind the back movements -avoid upper extremity ergometer    RED FLAGS: None   WEIGHT BEARING RESTRICTIONS: No  FALLS:  Has patient fallen in last 6 months? No  LIVING ENVIRONMENT: Lives with: lives with their spouse Lives in: House/apartment  OCCUPATION: Cardiovascular Specialist  PLOF: Independent  PATIENT GOALS:Return to occupation, return to PLOF.  NEXT MD VISIT: N/A  OBJECTIVE:  Note: Objective measures were completed at Evaluation unless otherwise noted.  DIAGNOSTIC FINDINGS:  N/A  PATIENT SURVEYS:  FOTO 47/68  COGNITION: Overall cognitive status: Within functional limits for tasks assessed     SENSATION: WFL  POSTURE: Mildly rounded shoulders  CERVICAL AROM: Flexion:  70 Extension: 55 Rotation R/L: 60/70 Lateral flexion R/L: 45/45  UPPER EXTREMITY ROM:   Active ROM unless otherwise noted Right Eval PROM 11/26 Left eval  Shoulder flexion 81 165  Shoulder extension  65  Shoulder  abduction  149  Shoulder adduction    Shoulder internal rotation 55 90  Shoulder external rotation 20 65  Elbow flexion WNL WNL  Elbow extension 0 WNL  Wrist flexion WNL WNL  Wrist extension WNL WNL  Wrist ulnar deviation    Wrist radial deviation    Wrist pronation WNL WNL  Wrist supination WNL WNL  (Blank rows = not tested)  UPPER EXTREMITY MMT:  MMT Right Eval Left Eval  Shoulder flexion deferred 5  Shoulder extension deferred   Shoulder abduction deferred 5  Shoulder adduction    Shoulder internal rotation deferred 5  Shoulder external rotation deferred 5  Middle trapezius    Lower trapezius    Elbow flexion  5  Elbow extension  4 (seated)   TODAY'S TREATMENT:                                                                                                                                         DATE: 07/19/23   There.ex:  Hook lying shoulder flexion AAROM with dowel: x20. Reaching 120 deg   Hook lying GHJ mobilizations: Grade 3, 2x15 sec bouts/plane AP and inferior to improve overhead AAROM. Discussed gentle completion of GHJ mobilizations at home to tolerance to improve joint mobility for overhead ranges. (Manual Therapy- 10 minutes total)  Hook lying shoulder flexion AAROM with dowel: x20, achieving 150 deg  reporting improved symptoms, ease of completion. No pain.    Seated R shoulder ER AAROM with dowel: 2x15. Required min to mod multimodal cuing with good carryover post education.   Prone R shoulder extension to neutral: 2x12  Prone R scap retraction to neutral: 2x12  Discussion on POC moving forward in short term and long term. Discussion on strength needs based off of work related tasks prior to return to work.  PATIENT  EDUCATION: Education details: Updated precautions/restrictions/HEP per phase 2 protocol Person educated: Patient Education method: Explanation Education comprehension: verbalized understanding, returned demonstration, verbal cues required, and tactile cues required  HOME EXERCISE PROGRAM: Access Code: [email or web address] URL: [email or web address] Date: 07/09/2023 Prepared by: Peggye Linear  Exercises - Seated Scapular Retraction  - 3 x daily - 1 sets - 15 reps - Seated Shoulder Shrugs  - 3 x daily - 1 sets - 15 reps - Seated Shoulder Flexion Towel Slide at Table Top  - 3 x daily - 1 sets - 15-20 reps - Seated Shoulder Abduction Towel Slide at Table Top  - 1 x daily - 1 sets - 15-20 reps - Prone Shoulder Row  - 3 x daily - 1 sets - 10 reps - Isometric Shoulder Adduction  - 3 x daily - 1 sets - 10 reps - 3 sec hold - Isometric Shoulder Abduction at Wall  - 3 x daily - 1 sets - 10 reps - 3 sec hold - Standing Isometric Shoulder External Rotation with Doorway  - 3 x  daily - 1 sets - 10 reps - 3 sec hold - Standing Isometric Shoulder Flexion with Doorway - Arm Bent  - 3 x daily - 1 sets - 10 reps - 3 sec hold - Standing Isometric Shoulder Internal Rotation at Doorway  - 3 x daily - 1 sets - 10 reps - 3 sec hold  ASSESSMENT:  CLINICAL IMPRESSION: Pt requiring re-education on current HEP. Pt with mild to moderate shoulder pain with seated AAROM flexion with dowel. Educated pt for these to be completed in supine per protocol. Pt does report improved symptoms likely due to reduction of gravity and spinal support. Initiated manual therapy for GHJ mobilizations to assist in overhead function with 30 degree improvement with reports of no pain at end range reaching 150 degrees AAROM in flexion. Educated on benefits of rest days as pt continues to be ahead of schedule per protocol. Educated pt on progression to gentle AROM at next session in supine if pt reports pain free AAROM in flexion. Also  continuing with prone, gentle periscapular strength exercises which remain painless. Will continue to phase 2 of protocol to restore PROM and continue with gentle strengthening and motor re-ed for eventual, AROM overhead. Pt will require and benefit from skilled PT to address his deficits and maximize return of R shoulder AROM and strength to PLOF.   OBJECTIVE IMPAIRMENTS: decreased mobility, decreased ROM, decreased strength, impaired UE functional use, postural dysfunction, and pain.   ACTIVITY LIMITATIONS: carrying, lifting, bathing, toileting, dressing, reach over head, and hygiene/grooming  PARTICIPATION LIMITATIONS: cleaning, community activity, occupation, and yard work  PERSONAL FACTORS: Age, Education, Fitness, Past/current experiences, Profession, and Time since onset of injury/illness/exacerbation are also affecting patient's functional outcome.   REHAB POTENTIAL: Good  CLINICAL DECISION MAKING: Stable/uncomplicated  EVALUATION COMPLEXITY: Low   GOALS: Goals reviewed with patient? No  SHORT TERM GOALS: Target date: 07/06/23 unless otherwise posted below  Pt will be independent with HEP to improve R shoulder AROM and strength within protocol parameters to improve functional mobility.  Baseline: 06/08/23: Provided initial HEP; 07/02/23: Independent with HEP progressing PROM.   Goal status: IN PROGRESS  2.  Pt will demonstrate R shoulder AROM in flexion to at least 120 degrees in supine for gradual return to overhead ADL completion. Baseline: 06/08/23: Phase 1 of current protocol (PROM) Goal status: INITIAL Target Date: 07/21/22 (8 weeks post op)  LONG TERM GOALS: Target date: 08/31/23 unless otherwise posted below  Pt will improve FOTO to target score to demonstrate clinically significant improvement in functional mobility. Baseline: 06/08/23: 47/68 Goal status: INITIAL  2.  Pt will demonstrate full, R shoulder AROM in all planes equal to LUE to demonstrate return to full  capacity to complete ADL's.  Baseline: 06/08/23  Active ROM unless otherwise noted Right Eval PROM 11/26 Left eval  Shoulder flexion 81 165  Shoulder extension  65  Shoulder abduction  149  Shoulder adduction    Shoulder internal rotation 55 90  Shoulder external rotation 20 65  Elbow flexion WNL WNL  Elbow extension 0 WNL  Wrist flexion WNL WNL  Wrist extension WNL WNL  Wrist ulnar deviation    Wrist radial deviation    Wrist pronation WNL WNL  Wrist supination WNL WNL  (Blank rows = not tested) Goal status: INITIAL  3.  Pt will be independent with updated HEP program including periscapular and RTC strength/stability related exercises to begin return to ability to complete work related and recreational related activity.   Baseline: 06/08/23:  Goal status: INITIAL  4.  Pt will report return to full work related tasks without strength or other reported functional deficits to demonstrate return to PLOF.  Baseline:  Goal status: INITIAL Target date: 11/28/22 (16 weeks post-op) PLAN:  PT FREQUENCY: 1-2x/week  PT DURATION: 12 weeks  PLANNED INTERVENTIONS: 97164- PT Re-evaluation, 97110-Therapeutic exercises, 97530- Therapeutic activity, 97112- Neuromuscular re-education, 97535- Self Care, [id number]- Manual therapy, 97014- Electrical stimulation (unattended), [phone]- Electrical stimulation (manual), Patient/Family education, Dry Needling, Joint mobilization, Spinal manipulation, Spinal mobilization, Cryotherapy, Moist heat, and Biofeedback  PLAN FOR NEXT SESSION: Phase 2 of protocol. Prone strengthening no resistance to neutral. Begin AROM in supine if having painless AAROM in flexion.    Dorina HERO. Fairly IV, PT, DPT Physical Therapist- [place]  [facility]  3:22 PM, 07/19/23

## 2023-07-22 ENCOUNTER — Other Ambulatory Visit: Payer: Self-pay

## 2023-07-22 ENCOUNTER — Other Ambulatory Visit: Payer: Self-pay | Admitting: Pulmonary Disease

## 2023-07-22 ENCOUNTER — Other Ambulatory Visit: Payer: Self-pay | Admitting: Family Medicine

## 2023-07-22 MED ORDER — METOPROLOL TARTRATE 25 MG PO TABS
25.0000 mg | ORAL_TABLET | Freq: Every day | ORAL | 3 refills | Status: DC
  Filled 2023-07-22: qty 90, 90d supply, fill #0
  Filled 2023-10-11 (×2): qty 90, 90d supply, fill #1
  Filled 2024-01-03: qty 90, 90d supply, fill #2
  Filled 2024-01-06: qty 90, 90d supply, fill #0
  Filled 2024-04-11: qty 90, 90d supply, fill #1

## 2023-07-22 MED FILL — Trazodone HCl Tab 50 MG: ORAL | 90 days supply | Qty: 90 | Fill #1 | Status: AC

## 2023-07-23 ENCOUNTER — Other Ambulatory Visit: Payer: Self-pay | Admitting: Pulmonary Disease

## 2023-07-23 ENCOUNTER — Other Ambulatory Visit: Payer: Self-pay

## 2023-07-25 ENCOUNTER — Other Ambulatory Visit: Payer: Self-pay

## 2023-07-26 ENCOUNTER — Ambulatory Visit: Payer: PRIVATE HEALTH INSURANCE | Attending: Orthopedic Surgery

## 2023-07-26 DIAGNOSIS — M25511 Pain in right shoulder: Secondary | ICD-10-CM | POA: Diagnosis present

## 2023-07-26 DIAGNOSIS — M6281 Muscle weakness (generalized): Secondary | ICD-10-CM | POA: Insufficient documentation

## 2023-07-26 NOTE — Therapy (Signed)
 OUTPATIENT PHYSICAL THERAPY SHOULDER TREATMENT   Patient Name: RAFFERTY POSTLEWAIT MRN: [id number] DOB:09/25/1960, 63 y.o., male Today's Date: 07/26/2023  END OF SESSION:  PT End of Session - 07/26/23 1442     Visit Number 7    Number of Visits 25    Date for PT Re-Evaluation 08/31/23    Authorization Type [employer] [facility]    Progress Note Due on Visit 10    PT Start Time 1435    PT Stop Time 1510    PT Time Calculation (min) 35 min    Activity Tolerance Patient tolerated treatment well    Behavior During Therapy WFL for tasks assessed/performed             Past Medical History:  Diagnosis Date   Actinic keratosis    Allergic rhinitis due to pollen    Asthma    vs COPD- taking inhalers- as related to allergies only.   Basal cell carcinoma 03/26/2020   R lat deltoid - ED&C    Dysplastic nevus 03/07/2020   L costal infrapectoral - moderate   Dysplastic nevus 03/07/2020   R post flank above waistline - moderate   Follicular cancer of thyroid  (HCC)    s/p partial thyroidectomy (follicular adenoma)-surgery only   GERD (gastroesophageal reflux disease)    Hearing impaired person, bilateral    hearing aida bilateralhigh frequency loss   Hyperlipidemia    Hypertension    Prostate cancer (HCC) 01/08/2016   PTSD (post-traumatic stress disorder)    s/p multiple active deployments for [employer], no medications taken currently   Past Surgical History:  Procedure Laterality Date   COLONOSCOPY W/ POLYPECTOMY     age 82 adenoma   INGUINAL HERNIA REPAIR     right   LEFT HEART CATH AND CORONARY ANGIOGRAPHY N/A 07/20/2022   Procedure: LEFT HEART CATH AND CORONARY ANGIOGRAPHY;  Surgeon: Wonda Sharper, MD;  Location: [facility] INVASIVE CV LAB;  Service: Cardiovascular;  Laterality: N/A;   REPLACEMENT ASCENDING AORTA N/A 09/07/2022   Procedure: REPLACEMENT ASCENDING AORTA;  Surgeon: Lucas Dorise POUR, MD;  Location: [facility] OR;  Service: Open Heart Surgery;  Laterality: N/A;  WITH  CIRC ARREST   ROBOT ASSISTED LAPAROSCOPIC RADICAL PROSTATECTOMY N/A 07/23/2016   Procedure: XI ROBOTIC ASSISTED LAPAROSCOPIC RADICAL PROSTATECTOMY LEVEL 1;  Surgeon: Gretel Ferrara, MD;  Location: [facility] ORS;  Service: Urology;  Laterality: N/A;   SHOULDER ARTHROSCOPY WITH ROTATOR CUFF REPAIR AND SUBACROMIAL DECOMPRESSION Right 05/27/2023   Procedure: SHOULDER ARTHROSCOPY WITH ROTATOR CUFF REPAIR AND SUBACROMIAL DECOMPRESSION;  Surgeon: Dozier Soulier, MD;  Location: [facility] ORS;  Service: Orthopedics;  Laterality: Right;  NEEDS 90 MINUTES   TEE WITHOUT CARDIOVERSION N/A 09/07/2022   Procedure: TRANSESOPHAGEAL ECHOCARDIOGRAM (TEE);  Surgeon: Lucas Dorise POUR, MD;  Location[facility];  Service: Open Heart Surgery;  Laterality: N/A;   THYROIDECTOMY, PARTIAL     [facility])   Patient Active Problem List   Diagnosis Date Noted   Anxiety and depression 02/21/2023   Pancytopenia (HCC) 02/21/2023   Elevated liver enzymes 02/21/2023   Alcohol  use disorder, severe 02/16/2023   S/P ascending aortic aneurysm repair 09/07/2022   Aneurysm of ascending aorta without rupture (HCC) 07/20/2022   Mild persistent asthma without complication 01/24/2020   Prostate cancer (HCC) 01/08/2016   Varicose veins of bilateral lower extremities with other complications 03/01/2013   Former smokeless tobacco use 11/25/2012   Follicular cancer of thyroid  (HCC)    GERD (gastroesophageal reflux disease)    Allergic rhinitis  due to pollen    Hypertension    Hyperlipidemia    PTSD (post-traumatic stress disorder)     PCP: Watt Mirza, MD  REFERRING PROVIDER: Eva MICAEL Herring  REFERRING DIAG: RIGHT SHOULDER ROTATOR CUFF TEAR   THERAPY DIAG:  Acute pain of right shoulder  Muscle weakness (generalized)  Rationale for Evaluation and Treatment: Rehabilitation  ONSET DATE: 05/27/23  SUBJECTIVE:                                                                                                                                                                                       SUBJECTIVE STATEMENT: Reports onset of 3-4x/week muscle cramping/spasming here lately. Unsure of the cause. Has taken rest days. Function and strength remains.  Hand dominance: Right  PERTINENT HISTORY: Pt is a pleasant 63 y.o. male referred to OPPT s/p R RTC tear repair and subacromial decompression 05/27/23. 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 Happened December 5th, 2023. Waited for his RTC surgery as he had open heart surgery for ascending aortic aneurysm in February 2024. Pain is minimal, controlled via Tylenol . R handed. Pt works in [employer] lab at [employer] requiring BUE use such as mobilizing patients, transfers, and use of equipment for performing catheterizations.   PAIN:  Are you having pain? None on arrival; noted soreness during end-range stretches and soreness at end of session.   PRECAUTIONS: Other: RTC repair protocol  Weeks Six-Twelve Precautions: -no lifting -no supporting BW with hands and arms -no sudden jerking -no excessive behind the back movements -avoid upper extremity ergometer    RED FLAGS: None   WEIGHT BEARING RESTRICTIONS: No  FALLS:  Has patient fallen in last 6 months? No  LIVING ENVIRONMENT: Lives with: lives with their spouse Lives in: House/apartment  OCCUPATION: Cardiovascular Specialist  PLOF: Independent  PATIENT GOALS:Return to occupation, return to PLOF.  NEXT MD VISIT: N/A  OBJECTIVE:  Note: Objective measures were completed at Evaluation unless otherwise noted.  DIAGNOSTIC FINDINGS:  N/A  PATIENT SURVEYS:  FOTO 47/68  COGNITION: Overall cognitive status: Within functional limits for tasks assessed     SENSATION: WFL  POSTURE: Mildly rounded shoulders  CERVICAL AROM: Flexion: 70 Extension: 55 Rotation R/L: 60/70 Lateral flexion R/L: 45/45  UPPER EXTREMITY ROM:   Active ROM unless otherwise noted Right Eval PROM  11/26 Left eval  Shoulder flexion 81 165  Shoulder extension  65  Shoulder abduction  149  Shoulder adduction    Shoulder internal rotation 55 90  Shoulder external rotation 20 65  Elbow flexion WNL WNL  Elbow extension 0 WNL  Wrist flexion WNL  WNL  Wrist extension WNL WNL  Wrist ulnar deviation    Wrist radial deviation    Wrist pronation WNL WNL  Wrist supination WNL WNL  (Blank rows = not tested)  UPPER EXTREMITY MMT:  MMT Right Eval Left Eval  Shoulder flexion deferred 5  Shoulder extension deferred   Shoulder abduction deferred 5  Shoulder adduction    Shoulder internal rotation deferred 5  Shoulder external rotation deferred 5  Middle trapezius    Lower trapezius    Elbow flexion  5  Elbow extension  4 (seated)   TODAY'S TREATMENT:                                                                                                                                         DATE: 07/26/23  There.ex:  Supine AROM R shoulder:  [place]: 2x12   Abduction: 2x12  ER/IR: 2x12/side   Manual R shoulder GHJ mobilizations b/t 1st and second set above:   Grade 3, AP/PA and inferior. 2x15, 2 sec bouts/direction/plane  Prone R shoulder extension to neutral: 2x12  Prone R scap retraction to neutral: 2x12  PATIENT EDUCATION: Education details: Updated precautions/restrictions/HEP per phase 2 protocol Person educated: Patient Education method: Explanation Education comprehension: verbalized understanding, returned demonstration, verbal cues required, and tactile cues required  HOME EXERCISE PROGRAM: Access Code: [email or web address] URL: [email or web address] Date: 07/09/2023 Prepared by: Peggye Linear  Exercises - Seated Scapular Retraction  - 3 x daily - 1 sets - 15 reps - Seated Shoulder Shrugs  - 3 x daily - 1 sets - 15 reps - Seated Shoulder Flexion Towel Slide at Table Top  - 3 x daily - 1 sets - 15-20 reps - Seated Shoulder Abduction Towel Slide at Table Top  -  1 x daily - 1 sets - 15-20 reps - Prone Shoulder Row  - 3 x daily - 1 sets - 10 reps - Isometric Shoulder Adduction  - 3 x daily - 1 sets - 10 reps - 3 sec hold - Isometric Shoulder Abduction at Wall  - 3 x daily - 1 sets - 10 reps - 3 sec hold - Standing Isometric Shoulder External Rotation with Doorway  - 3 x daily - 1 sets - 10 reps - 3 sec hold - Standing Isometric Shoulder Flexion with Doorway - Arm Bent  - 3 x daily - 1 sets - 10 reps - 3 sec hold - Standing Isometric Shoulder Internal Rotation at Doorway  - 3 x daily - 1 sets - 10 reps - 3 sec hold  ASSESSMENT:  CLINICAL IMPRESSION: Pt tolerating initiation of AROM in R shoulder flexion, abduction, ER/IR without issue. Continuing with brief periods of manual intervention to assist in restoring full shoulder ROM. Updated HEP with good understanding of AROM and tolerances. Will continue to phase 2 of protocol to restore PROM and continue with gentle strengthening and motor re-ed for  eventual, AROM overhead. Pt will require and benefit from skilled PT to address his deficits and maximize return of R shoulder AROM and strength to PLOF.   OBJECTIVE IMPAIRMENTS: decreased mobility, decreased ROM, decreased strength, impaired UE functional use, postural dysfunction, and pain.   ACTIVITY LIMITATIONS: carrying, lifting, bathing, toileting, dressing, reach over head, and hygiene/grooming  PARTICIPATION LIMITATIONS: cleaning, community activity, occupation, and yard work  PERSONAL FACTORS: Age, Education, Fitness, Past/current experiences, Profession, and Time since onset of injury/illness/exacerbation are also affecting patient's functional outcome.   REHAB POTENTIAL: Good  CLINICAL DECISION MAKING: Stable/uncomplicated  EVALUATION COMPLEXITY: Low   GOALS: Goals reviewed with patient? No  SHORT TERM GOALS: Target date: 07/06/23 unless otherwise posted below  Pt will be independent with HEP to improve R shoulder AROM and strength within  protocol parameters to improve functional mobility.  Baseline: 06/08/23: Provided initial HEP; 07/02/23: Independent with HEP progressing PROM.   Goal status: IN PROGRESS  2.  Pt will demonstrate R shoulder AROM in flexion to at least 120 degrees in supine for gradual return to overhead ADL completion. Baseline: 06/08/23: Phase 1 of current protocol (PROM) Goal status: INITIAL Target Date: 07/21/22 (8 weeks post op)  LONG TERM GOALS: Target date: 08/31/23 unless otherwise posted below  Pt will improve FOTO to target score to demonstrate clinically significant improvement in functional mobility. Baseline: 06/08/23: 47/68 Goal status: INITIAL  2.  Pt will demonstrate full, R shoulder AROM in all planes equal to LUE to demonstrate return to full capacity to complete ADL's.  Baseline: 06/08/23  Active ROM unless otherwise noted Right Eval PROM 11/26 Left eval  Shoulder flexion 81 165  Shoulder extension  65  Shoulder abduction  149  Shoulder adduction    Shoulder internal rotation 55 90  Shoulder external rotation 20 65  Elbow flexion WNL WNL  Elbow extension 0 WNL  Wrist flexion WNL WNL  Wrist extension WNL WNL  Wrist ulnar deviation    Wrist radial deviation    Wrist pronation WNL WNL  Wrist supination WNL WNL  (Blank rows = not tested) Goal status: INITIAL  3.  Pt will be independent with updated HEP program including periscapular and RTC strength/stability related exercises to begin return to ability to complete work related and recreational related activity.   Baseline: 06/08/23:  Goal status: INITIAL  4.  Pt will report return to full work related tasks without strength or other reported functional deficits to demonstrate return to PLOF.  Baseline:  Goal status: INITIAL Target date: 11/28/22 (16 weeks post-op) PLAN:  PT FREQUENCY: 1-2x/week  PT DURATION: 12 weeks  PLANNED INTERVENTIONS: 97164- PT Re-evaluation, 97110-Therapeutic exercises, 97530- Therapeutic  activity, 97112- Neuromuscular re-education, 97535- Self Care, [id number]- Manual therapy, 97014- Electrical stimulation (unattended), [phone]- Electrical stimulation (manual), Patient/Family education, Dry Needling, Joint mobilization, Spinal manipulation, Spinal mobilization, Cryotherapy, Moist heat, and Biofeedback  PLAN FOR NEXT SESSION: Phase 2 of protocol. Progress prone strengthening no resistance to neutral. AROM in supine   Dorina HERO. Fairly IV, PT, DPT Physical Therapist- [place]  [facility]  3:33 PM, 07/26/23

## 2023-07-27 ENCOUNTER — Ambulatory Visit: Payer: PRIVATE HEALTH INSURANCE

## 2023-08-03 ENCOUNTER — Ambulatory Visit: Payer: PRIVATE HEALTH INSURANCE

## 2023-08-05 ENCOUNTER — Other Ambulatory Visit: Payer: Self-pay

## 2023-08-05 ENCOUNTER — Other Ambulatory Visit: Payer: Self-pay | Admitting: Pulmonary Disease

## 2023-08-05 ENCOUNTER — Ambulatory Visit: Payer: PRIVATE HEALTH INSURANCE

## 2023-08-05 DIAGNOSIS — M25511 Pain in right shoulder: Secondary | ICD-10-CM | POA: Diagnosis not present

## 2023-08-05 DIAGNOSIS — M6281 Muscle weakness (generalized): Secondary | ICD-10-CM

## 2023-08-05 NOTE — Therapy (Signed)
OUTPATIENT PHYSICAL THERAPY SHOULDER TREATMENT   Patient Name: Austin Houston MRN: [id number] DOB:1960-07-22, 63 y.o., male Today's Date: 08/05/2023  END OF SESSION:  PT End of Session - 08/05/23 0813     Visit Number 8    Number of Visits 25    Date for PT Re-Evaluation 08/31/23    Authorization Type [facility]    Progress Note Due on Visit 10    PT Start Time 0815    PT Stop Time 0900    PT Time Calculation (min) 45 min    Activity Tolerance Patient tolerated treatment well    Behavior During Therapy [facility] for tasks assessed/performed             Past Medical History:  Diagnosis Date   Actinic keratosis    Allergic rhinitis due to pollen    Asthma    vs COPD- taking inhalers- as related to allergies only.   Basal cell carcinoma 03/26/2020   R lat deltoid - ED&C    Dysplastic nevus 03/07/2020   L costal infrapectoral - moderate   Dysplastic nevus 03/07/2020   R post flank above waistline - moderate   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HCC)    s/p partial thyroidectomy (follicular adenoma)-surgery only   GERD (gastroesophageal reflux disease)    Hearing impaired person, bilateral    hearing aida bilateral"high frequency loss   Hyperlipidemia    Hypertension    Prostate cancer (HCC) 01/08/2016   PTSD (post-traumatic stress disorder)    s/p multiple active deployments for [employer], no medications taken currently   Past Surgical History:  Procedure Laterality Date   COLONOSCOPY W/ POLYPECTOMY     age 61 "adenoma"   INGUINAL HERNIA REPAIR     right   LEFT HEART CATH AND CORONARY ANGIOGRAPHY N/A 07/20/2022   Procedure: LEFT HEART CATH AND CORONARY ANGIOGRAPHY;  Surgeon: Tonny Bollman, MD;  Location: [facility] INVASIVE CV LAB;  Service: Cardiovascular;  Laterality: N/A;   REPLACEMENT ASCENDING AORTA N/A 09/07/2022   Procedure: REPLACEMENT ASCENDING AORTA;  Surgeon: Alleen Borne, MD;  Location: [facility] OR;  Service: Open Heart Surgery;  Laterality: N/A;  WITH  CIRC ARREST   ROBOT ASSISTED LAPAROSCOPIC RADICAL PROSTATECTOMY N/A 07/23/2016   Procedure: XI ROBOTIC ASSISTED LAPAROSCOPIC RADICAL PROSTATECTOMY LEVEL 1;  Surgeon: Heloise Purpura, MD;  Location: [facility] ORS;  Service: Urology;  Laterality: N/A;   SHOULDER ARTHROSCOPY WITH ROTATOR CUFF REPAIR AND SUBACROMIAL DECOMPRESSION Right 05/27/2023   Procedure: SHOULDER ARTHROSCOPY WITH ROTATOR CUFF REPAIR AND SUBACROMIAL DECOMPRESSION;  Surgeon: Jones Broom, MD;  Location: [facility] ORS;  Service: Orthopedics;  Laterality: Right;  NEEDS 90 MINUTES   TEE WITHOUT CARDIOVERSION N/A 09/07/2022   Procedure: TRANSESOPHAGEAL ECHOCARDIOGRAM (TEE);  Surgeon: Alleen Borne, MD;  Location: [facility] OR;  Service: Open Heart Surgery;  Laterality: N/A;   THYROIDECTOMY, PARTIAL     [facility])   Patient Active Problem List   Diagnosis Date Noted   Anxiety and depression 02/21/2023   Pancytopenia (HCC) 02/21/2023   Elevated liver enzymes 02/21/2023   Alcohol use disorder, severe 02/16/2023   S/P ascending aortic aneurysm repair 09/07/2022   Aneurysm of ascending aorta without rupture (HCC) 07/20/2022   Mild persistent asthma without complication 01/24/2020   Prostate cancer (HCC) 01/08/2016   Varicose veins of bilateral lower extremities with other complications 03/01/2013   Former smokeless tobacco use 11/25/2012   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HCC)    GERD (gastroesophageal reflux disease)    Allergic rhinitis  due to pollen    Hypertension    Hyperlipidemia    PTSD (post-traumatic stress disorder)     PCP: Hannah Beat, MD  REFERRING PROVIDER: Berline Lopes  REFERRING DIAG: RIGHT SHOULDER ROTATOR CUFF TEAR   THERAPY DIAG:  Acute pain of right shoulder  Muscle weakness (generalized)  Rationale for Evaluation and Treatment: Rehabilitation  ONSET DATE: 05/27/23  SUBJECTIVE:                                                                                                                                                                                       SUBJECTIVE STATEMENT: Reports soreness after last session. Cramping/spasms have improved but still occurring some randomly. Compliant with updated HEP.   Hand dominance: Right  PERTINENT HISTORY: Pt is a pleasant 63 y.o. male referred to OPPT s/p R RTC tear repair and subacromial decompression 05/27/23. Pt reports having a fall at work in the [employer] onto his R shoulder. Happened December 5th, 2023. Waited for his RTC surgery as he had open heart surgery for ascending aortic aneurysm in February 2024. Pain is minimal, controlled via Tylenol. R handed. Pt works in [employer] lab at [employer] requiring BUE use such as mobilizing patients, transfers, and use of equipment for performing catheterizations.   PAIN:  Are you having pain? None on arrival; noted soreness during end-range stretches and soreness at end of session.   PRECAUTIONS: Other: RTC repair protocol  Weeks Six-Twelve Precautions: -no lifting -no supporting BW with hands and arms -no sudden jerking -no excessive behind the back movements -avoid upper extremity ergometer    RED FLAGS: None   WEIGHT BEARING RESTRICTIONS: No  FALLS:  Has patient fallen in last 6 months? No  LIVING ENVIRONMENT: Lives with: lives with their spouse Lives in: House/apartment  OCCUPATION: Cardiovascular Specialist  PLOF: Independent  PATIENT GOALS:Return to occupation, return to PLOF.  NEXT MD VISIT: N/A  OBJECTIVE:  Note: Objective measures were completed at Evaluation unless otherwise noted.  DIAGNOSTIC FINDINGS:  N/A  PATIENT SURVEYS:  FOTO 47/68  COGNITION: Overall cognitive status: Within functional limits for tasks assessed     SENSATION: WFL  POSTURE: Mildly rounded shoulders  CERVICAL AROM: Flexion: 70 Extension: 55 Rotation R/L: 60/70 Lateral flexion R/L: 45/45  UPPER EXTREMITY ROM:   Active ROM unless otherwise noted Right Eval PROM 11/26  Left eval  Shoulder flexion 81 165  Shoulder extension  65  Shoulder abduction  149  Shoulder adduction    Shoulder internal rotation 55 90  Shoulder external rotation 20 65  Elbow flexion WNL WNL  Elbow extension 0 WNL  Wrist flexion WNL WNL  Wrist extension WNL WNL  Wrist ulnar deviation    Wrist radial deviation    Wrist pronation WNL WNL  Wrist supination WNL WNL  (Blank rows = not tested)  UPPER EXTREMITY MMT:  MMT Right Eval Left Eval  Shoulder flexion deferred 5  Shoulder extension deferred   Shoulder abduction deferred 5  Shoulder adduction    Shoulder internal rotation deferred 5  Shoulder external rotation deferred 5  Middle trapezius    Lower trapezius    Elbow flexion  5  Elbow extension  4 (seated)   TODAY'S TREATMENT:                                                                                                                                         DATE: 08/05/23  There.ex:   Supine shoulder AAROM warm-up:   Flexion with dowel: x20  Supine AROM R shoulder:  Scaption: 2x12. Attaining 125 degrees  Abduction: 2x12. Attaining 108 degrees  ER/IR: 2x12/side. Attaining 50 degrees   Supine R shoulder rhythmic stabilizations at 90 degrees scaption: 4x30 sec   L side lying:   R shoulder ER against gravity: 2x12   R shoulder lateral raises open can: 2x12  Prone R shoulder horizontal abduction: 2x12   Reviewed updated HEP with new hand out provided. Education on reps/sets/frequency for phase 3 exercises.     PATIENT EDUCATION: Education details: Updated precautions/restrictions/HEP per phase 2 protocol Person educated: Patient Education method: Explanation Education comprehension: verbalized understanding, returned demonstration, verbal cues required, and tactile cues required  HOME EXERCISE PROGRAM: Access Code: [email or web address] URL: [email or web address] Date: 08/05/2023 Prepared by: Ronnie Derby  Exercises - Prone Shoulder Row  -  3 x daily - 1 sets - 10 reps - Isometric Shoulder Adduction  - 3 x daily - 1 sets - 10 reps - 3 sec hold - Isometric Shoulder Abduction at Wall  - 3 x daily - 1 sets - 10 reps - 3 sec hold - Standing Isometric Shoulder External Rotation with Doorway  - 3 x daily - 1 sets - 10 reps - 3 sec hold - Standing Isometric Shoulder Flexion with Doorway - Arm Bent  - 3 x daily - 1 sets - 10 reps - 3 sec hold - Standing Isometric Shoulder Internal Rotation at Doorway  - 3 x daily - 1 sets - 10 reps - 3 sec hold - Supine Shoulder Flexion Extension AAROM with Dowel  - 1 x daily - 4-5 x weekly - 3 sets - 12 reps - Sidelying Shoulder External Rotation  - 1 x daily - 3 x weekly - 2 sets - 12 reps - Prone Shoulder Row  - 1 x daily - 3 x weekly - 2 sets - 12 reps - Prone Shoulder Extension - Single Arm  - 1 x daily - 3 x weekly - 2 sets - 12 reps - Prone Shoulder Horizontal Abduction  -  1 x daily - 3 x weekly - 2 sets - 12 reps - Supine Shoulder Flexion Extension Full Range AROM  - 1 x daily - 7 x weekly - 2 sets - 15 reps - Supine Shoulder Abduction AROM  - 1 x daily - 7 x weekly - 2 sets - 15 reps   Access Code: [email or web address] URL: [email or web address] Date: 07/09/2023 Prepared by: Alvera Novel  Exercises - Seated Scapular Retraction  - 3 x daily - 1 sets - 15 reps - Seated Shoulder Shrugs  - 3 x daily - 1 sets - 15 reps - Seated Shoulder Flexion Towel Slide at Table Top  - 3 x daily - 1 sets - 15-20 reps - Seated Shoulder Abduction Towel Slide at Table Top  - 1 x daily - 1 sets - 15-20 reps - Prone Shoulder Row  - 3 x daily - 1 sets - 10 reps - Isometric Shoulder Adduction  - 3 x daily - 1 sets - 10 reps - 3 sec hold - Isometric Shoulder Abduction at Wall  - 3 x daily - 1 sets - 10 reps - 3 sec hold - Standing Isometric Shoulder External Rotation with Doorway  - 3 x daily - 1 sets - 10 reps - 3 sec hold - Standing Isometric Shoulder Flexion with Doorway - Arm Bent  - 3 x daily - 1 sets - 10  reps - 3 sec hold - Standing Isometric Shoulder Internal Rotation at Doorway  - 3 x daily - 1 sets - 10 reps - 3 sec hold  ASSESSMENT:  CLINICAL IMPRESSION: Pt at phase 3 of RTC repair protocol now being s/p 10 weeks today. Continuing to progress R shoulder AROM to normalized ranges. Remains most limited in abduction but ER and scaption continue to improve. Pt tolerating progression into phase 3 without pain with initiation of prone/side lying periscapular and RTC strengthening and gentle rhythmic stabilization well. Pt endorses musculare fatigue but no other symptoms. Provided HEP to reflect updates in POC per protocol phase. PT to plan on progressing to 2x/week shortly now that protocol is allowing initiation of strength training. Pt will require and benefit from skilled PT to address his deficits and maximize return of R shoulder AROM and strength to PLOF.   OBJECTIVE IMPAIRMENTS: decreased mobility, decreased ROM, decreased strength, impaired UE functional use, postural dysfunction, and pain.   ACTIVITY LIMITATIONS: carrying, lifting, bathing, toileting, dressing, reach over head, and hygiene/grooming  PARTICIPATION LIMITATIONS: cleaning, community activity, occupation, and yard work  PERSONAL FACTORS: Age, Education, Fitness, Past/current experiences, Profession, and Time since onset of injury/illness/exacerbation are also affecting patient's functional outcome.   REHAB POTENTIAL: Good  CLINICAL DECISION MAKING: Stable/uncomplicated  EVALUATION COMPLEXITY: Low   GOALS: Goals reviewed with patient? No  SHORT TERM GOALS: Target date: 07/06/23 unless otherwise posted below  Pt will be independent with HEP to improve R shoulder AROM and strength within protocol parameters to improve functional mobility.  Baseline: 06/08/23: Provided initial HEP; 07/02/23: Independent with HEP progressing PROM.   Goal status: MET  2.  Pt will demonstrate R shoulder AROM in flexion to at least 120  degrees in supine for gradual return to overhead ADL completion. Baseline: 06/08/23: Phase 1 of current protocol (PROM); 08/05/23: 125 degrees Goal status: MET Target Date: 07/21/22 (8 weeks post op)  LONG TERM GOALS: Target date: 08/31/23 unless otherwise posted below  Pt will improve FOTO to target score to demonstrate clinically significant improvement in functional mobility. Baseline: 06/08/23: 47/68 Goal  status: INITIAL  2.  Pt will demonstrate full, R shoulder AROM in all planes equal to LUE to demonstrate return to full capacity to complete ADL's.  Baseline: 06/08/23  Active ROM unless otherwise noted Right Eval PROM 11/26 Left eval  Shoulder flexion 81 165  Shoulder extension  65  Shoulder abduction  149  Shoulder adduction    Shoulder internal rotation 55 90  Shoulder external rotation 20 65  Elbow flexion WNL WNL  Elbow extension 0 WNL  Wrist flexion WNL WNL  Wrist extension WNL WNL  Wrist ulnar deviation    Wrist radial deviation    Wrist pronation WNL WNL  Wrist supination WNL WNL  (Blank rows = not tested) Goal status: INITIAL  3.  Pt will be independent with updated HEP program including periscapular and RTC strength/stability related exercises to begin return to ability to complete work related and recreational related activity.   Baseline: 06/08/23:  Goal status: INITIAL  4.  Pt will report return to full work related tasks without strength or other reported functional deficits to demonstrate return to PLOF.  Baseline:  Goal status: INITIAL Target date: 11/28/22 (16 weeks post-op) PLAN:  PT FREQUENCY: 1-2x/week  PT DURATION: 12 weeks  PLANNED INTERVENTIONS: 97164- PT Re-evaluation, 97110-Therapeutic exercises, 97530- Therapeutic activity, 97112- Neuromuscular re-education, 97535- Self Care, 16109- Manual therapy, 97014- Electrical stimulation (unattended), [phone]- Electrical stimulation (manual), Patient/Family education, Dry Needling, Joint mobilization,  Spinal manipulation, Spinal mobilization, Cryotherapy, Moist heat, and Biofeedback  PLAN FOR NEXT SESSION: Phase 3 of protocol. Review updated HEP. Rhythmic stabilization drills, neuro re-ed of scapular upward rotation. Progress prone strengthening no resistance to neutral. AROM in supine   Delphia Grates. Fairly IV, PT, DPT Physical Therapist- [facility]  9:09 AM, 08/05/23

## 2023-08-06 ENCOUNTER — Other Ambulatory Visit: Payer: Self-pay

## 2023-08-06 MED FILL — Budesonide-Formoterol Fumarate Dihyd Aerosol 160-4.5 MCG/ACT: RESPIRATORY_TRACT | 30 days supply | Qty: 10.2 | Fill #0 | Status: AC

## 2023-08-09 ENCOUNTER — Ambulatory Visit: Payer: PRIVATE HEALTH INSURANCE

## 2023-08-11 ENCOUNTER — Ambulatory Visit: Payer: PRIVATE HEALTH INSURANCE

## 2023-08-11 DIAGNOSIS — M25511 Pain in right shoulder: Secondary | ICD-10-CM | POA: Diagnosis not present

## 2023-08-11 DIAGNOSIS — M6281 Muscle weakness (generalized): Secondary | ICD-10-CM

## 2023-08-11 NOTE — Therapy (Signed)
OUTPATIENT PHYSICAL THERAPY SHOULDER TREATMENT   Patient Name: Austin Houston MRN: [id number] DOB:10/25/1960, 63 y.o., male Today's Date: 08/11/2023  END OF SESSION:  PT End of Session - 08/11/23 1347     Visit Number 9    Number of Visits 25    Date for PT Re-Evaluation 08/31/23    Authorization Type [facility]    Progress Note Due on Visit 10    PT Start Time 1346    PT Stop Time 1429    PT Time Calculation (min) 43 min    Activity Tolerance Patient tolerated treatment well    Behavior During Therapy WFL for tasks assessed/performed             Past Medical History:  Diagnosis Date   Actinic keratosis    Allergic rhinitis due to pollen    Asthma    vs COPD- taking inhalers- as related to allergies only.   Basal cell carcinoma 03/26/2020   R lat deltoid - ED&C    Dysplastic nevus 03/07/2020   L costal infrapectoral - moderate   Dysplastic nevus 03/07/2020   R post flank above waistline - moderate   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HCC)    s/p partial thyroidectomy (follicular adenoma)-surgery only   GERD (gastroesophageal reflux disease)    Hearing impaired person, bilateral    hearing aida bilateral"high frequency loss   Hyperlipidemia    Hypertension    Prostate cancer (HCC) 01/08/2016   PTSD (post-traumatic stress disorder)    s/p multiple active deployments for [employer], no medications taken currently   Past Surgical History:  Procedure Laterality Date   COLONOSCOPY W/ POLYPECTOMY     age 89 "adenoma"   INGUINAL HERNIA REPAIR     right   LEFT HEART CATH AND CORONARY ANGIOGRAPHY N/A 07/20/2022   Procedure: LEFT HEART CATH AND CORONARY ANGIOGRAPHY;  Surgeon: Tonny Bollman, MD;  Location: [facility] INVASIVE CV LAB;  Service: Cardiovascular;  Laterality: N/A;   REPLACEMENT ASCENDING AORTA N/A 09/07/2022   Procedure: REPLACEMENT ASCENDING AORTA;  Surgeon: Alleen Borne, MD;  Location: [facility] OR;  Service: Open Heart Surgery;  Laterality: N/A;  WITH  [facility] ARREST   ROBOT ASSISTED LAPAROSCOPIC RADICAL PROSTATECTOMY N/A 07/23/2016   Procedure: XI ROBOTIC ASSISTED LAPAROSCOPIC RADICAL PROSTATECTOMY LEVEL 1;  Surgeon: Heloise Purpura, MD;  Location: [facility] ORS;  Service: Urology;  Laterality: N/A;   SHOULDER ARTHROSCOPY WITH ROTATOR CUFF REPAIR AND SUBACROMIAL DECOMPRESSION Right 05/27/2023   Procedure: SHOULDER ARTHROSCOPY WITH ROTATOR CUFF REPAIR AND SUBACROMIAL DECOMPRESSION;  Surgeon: Jones Broom, MD;  Location: [facility] ORS;  Service: Orthopedics;  Laterality: Right;  NEEDS 90 MINUTES   TEE WITHOUT CARDIOVERSION N/A 09/07/2022   Procedure: TRANSESOPHAGEAL ECHOCARDIOGRAM (TEE);  Surgeon: Alleen Borne, MD;  Location: [facility] OR;  Service: Open Heart Surgery;  Laterality: N/A;   THYROIDECTOMY, PARTIAL     [facility])   Patient Active Problem List   Diagnosis Date Noted   Anxiety and depression 02/21/2023   Pancytopenia (HCC) 02/21/2023   Elevated liver enzymes 02/21/2023   Alcohol use disorder, severe 02/16/2023   S/P ascending aortic aneurysm repair 09/07/2022   Aneurysm of ascending aorta without rupture (HCC) 07/20/2022   Mild persistent asthma without complication 01/24/2020   Prostate cancer (HCC) 01/08/2016   Varicose veins of bilateral lower extremities with other complications 03/01/2013   Former smokeless tobacco use 11/25/2012   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HCC)    GERD (gastroesophageal reflux disease)    Allergic rhinitis  due to pollen    Hypertension    Hyperlipidemia    PTSD (post-traumatic stress disorder)     PCP: Hannah Beat, MD  REFERRING PROVIDER: Berline Lopes  REFERRING DIAG: RIGHT SHOULDER ROTATOR CUFF TEAR   THERAPY DIAG:  Acute pain of right shoulder  Muscle weakness (generalized)  Rationale for Evaluation and Treatment: Rehabilitation  ONSET DATE: 05/27/23  SUBJECTIVE:                                                                                                                                                                                       SUBJECTIVE STATEMENT: Reports his shoulder continues to do well. Having some pain with abduction. Hand dominance: Right  PERTINENT HISTORY: Pt is a pleasant 63 y.o. male referred to OPPT s/p R RTC tear repair and subacromial decompression 05/27/23. Pt reports having a fall at work in the [employer] onto his R shoulder. Happened December 5th, 2023. Waited for his RTC surgery as he had open heart surgery for ascending aortic aneurysm in February 2024. Pain is minimal, controlled via Tylenol. R handed. Pt works in [employer] lab at [employer] requiring BUE use such as mobilizing patients, transfers, and use of equipment for performing catheterizations.   PAIN:  Are you having pain? None on arrival; noted soreness during end-range stretches and soreness at end of session.   PRECAUTIONS: Other: RTC repair protocol  Weeks Six-Twelve Precautions: -no lifting -no supporting BW with hands and arms -no sudden jerking -no excessive behind the back movements -avoid upper extremity ergometer    RED FLAGS: None   WEIGHT BEARING RESTRICTIONS: No  FALLS:  Has patient fallen in last 6 months? No  LIVING ENVIRONMENT: Lives with: lives with their spouse Lives in: House/apartment  OCCUPATION: Cardiovascular Specialist  PLOF: Independent  PATIENT GOALS:Return to occupation, return to PLOF.  NEXT MD VISIT: N/A  OBJECTIVE:  Note: Objective measures were completed at Evaluation unless otherwise noted.  DIAGNOSTIC FINDINGS:  N/A  PATIENT SURVEYS:  FOTO 47/68  COGNITION: Overall cognitive status: Within functional limits for tasks assessed     SENSATION: WFL  POSTURE: Mildly rounded shoulders  CERVICAL AROM: Flexion: 70 Extension: 55 Rotation R/L: 60/70 Lateral flexion R/L: 45/45  UPPER EXTREMITY ROM:   Active ROM unless otherwise noted Right Eval PROM 11/26 Left eval  Shoulder flexion 81 165  Shoulder extension  65   Shoulder abduction  149  Shoulder adduction    Shoulder internal rotation 55 90  Shoulder external rotation 20 65  Elbow flexion WNL WNL  Elbow extension 0 WNL  Wrist flexion WNL WNL  Wrist extension WNL WNL  Wrist ulnar  deviation    Wrist radial deviation    Wrist pronation WNL WNL  Wrist supination WNL WNL  (Blank rows = not tested)  UPPER EXTREMITY MMT:  MMT Right Eval Left Eval  Shoulder flexion deferred 5  Shoulder extension deferred   Shoulder abduction deferred 5  Shoulder adduction    Shoulder internal rotation deferred 5  Shoulder external rotation deferred 5  Middle trapezius    Lower trapezius    Elbow flexion  5  Elbow extension  4 (seated)   TODAY'S TREATMENT:                                                                                                                                         DATE: 08/11/23  There.ex:   Supine AROM R shoulder:  Scaption: x12. Attaining 141 degrees.  Abduction: x12. Attaining 108 degrees  ER/IR: x12/side. Attaining 62 degrees    L side lying:   R shoulder ER against gravity: 2x12   R shoulder lateral raises open can: 2x12  Seated AROM shoulder  Scaption: 2x8, 130 degrees  Abduction: 2x8, short lever arm to 90 degrees   Reviewed updated HEP with new hand out provided. Education on reps/sets/frequency for phase 3 exercises.     PATIENT EDUCATION: Education details: Updated precautions/restrictions/HEP per phase 2 protocol Person educated: Patient Education method: Explanation Education comprehension: verbalized understanding, returned demonstration, verbal cues required, and tactile cues required  HOME EXERCISE PROGRAM: Access Code: [email or web address] URL: [email or web address] Date: 08/11/2023 Prepared by: Ronnie Derby  Exercises - Seated Single Arm Shoulder Scaption  - 1 x daily - 3-4 x weekly - 2 sets - 12 reps - Seated Shoulder External Rotation  - 1 x daily - 3-4 x weekly - 2 sets - 12 reps -  Seated Shoulder Abduction with Bent Elbow  - 1 x daily - 3-4 x weekly - 2 sets - 12 reps - Prone Shoulder Row  - 3 x daily - 1 sets - 10 reps - Supine Shoulder Flexion Extension AAROM with Dowel  - 1 x daily - 4-5 x weekly - 3 sets - 12 reps - Sidelying Shoulder External Rotation  - 1 x daily - 3 x weekly - 2 sets - 12 reps - Prone Shoulder Row  - 1 x daily - 3 x weekly - 2 sets - 12 reps - Prone Shoulder Extension - Single Arm  - 1 x daily - 3 x weekly - 2 sets - 12 reps  Access Code: [email or web address] URL: [email or web address] Date: 08/05/2023 Prepared by: Ronnie Derby  Exercises - Prone Shoulder Row  - 3 x daily - 1 sets - 10 reps - Isometric Shoulder Adduction  - 3 x daily - 1 sets - 10 reps - 3 sec hold - Isometric Shoulder Abduction at Wall  - 3 x daily - 1 sets - 10 reps - 3 sec hold - Standing Isometric Shoulder  External Rotation with Doorway  - 3 x daily - 1 sets - 10 reps - 3 sec hold - Standing Isometric Shoulder Flexion with Doorway - Arm Bent  - 3 x daily - 1 sets - 10 reps - 3 sec hold - Standing Isometric Shoulder Internal Rotation at Doorway  - 3 x daily - 1 sets - 10 reps - 3 sec hold - Supine Shoulder Flexion Extension AAROM with Dowel  - 1 x daily - 4-5 x weekly - 3 sets - 12 reps - Sidelying Shoulder External Rotation  - 1 x daily - 3 x weekly - 2 sets - 12 reps - Prone Shoulder Row  - 1 x daily - 3 x weekly - 2 sets - 12 reps - Prone Shoulder Extension - Single Arm  - 1 x daily - 3 x weekly - 2 sets - 12 reps - Prone Shoulder Horizontal Abduction  - 1 x daily - 3 x weekly - 2 sets - 12 reps - Supine Shoulder Flexion Extension Full Range AROM  - 1 x daily - 7 x weekly - 2 sets - 15 reps - Supine Shoulder Abduction AROM  - 1 x daily - 7 x weekly - 2 sets - 15 reps   Access Code: [email or web address] URL: [email or web address] Date: 07/09/2023 Prepared by: Alvera Novel  Exercises - Seated Scapular Retraction  - 3 x daily - 1 sets - 15 reps - Seated  Shoulder Shrugs  - 3 x daily - 1 sets - 15 reps - Seated Shoulder Flexion Towel Slide at Table Top  - 3 x daily - 1 sets - 15-20 reps - Seated Shoulder Abduction Towel Slide at Table Top  - 1 x daily - 1 sets - 15-20 reps - Prone Shoulder Row  - 3 x daily - 1 sets - 10 reps - Isometric Shoulder Adduction  - 3 x daily - 1 sets - 10 reps - 3 sec hold - Isometric Shoulder Abduction at Wall  - 3 x daily - 1 sets - 10 reps - 3 sec hold - Standing Isometric Shoulder External Rotation with Doorway  - 3 x daily - 1 sets - 10 reps - 3 sec hold - Standing Isometric Shoulder Flexion with Doorway - Arm Bent  - 3 x daily - 1 sets - 10 reps - 3 sec hold - Standing Isometric Shoulder Internal Rotation at Doorway  - 3 x daily - 1 sets - 10 reps - 3 sec hold  ASSESSMENT:  CLINICAL IMPRESSION: Continuing phase 3 of RTC repair protocol. Pt making good progress with shoulder flexion, ER and abduction AROM and tolerance for movement. Pt remains most limited in abduction due to some pain levels at end range around 100 degrees. Progressing HEP to seated to utilize gravity for resistance. Will continue to progress with light resistance next session per protocol and will begin with 2x/week as schedule allows. PT to perform progress note next visit to assess progress towards goals. Pt will require and benefit from skilled PT to address his deficits and maximize return of R shoulder AROM and strength to PLOF.   OBJECTIVE IMPAIRMENTS: decreased mobility, decreased ROM, decreased strength, impaired UE functional use, postural dysfunction, and pain.   ACTIVITY LIMITATIONS: carrying, lifting, bathing, toileting, dressing, reach over head, and hygiene/grooming  PARTICIPATION LIMITATIONS: cleaning, community activity, occupation, and yard work  PERSONAL FACTORS: Age, Education, Fitness, Past/current experiences, Profession, and Time since onset of injury/illness/exacerbation are also affecting patient's functional outcome.    REHAB  POTENTIAL: Good  CLINICAL DECISION MAKING: Stable/uncomplicated  EVALUATION COMPLEXITY: Low   GOALS: Goals reviewed with patient? No  SHORT TERM GOALS: Target date: 07/06/23 unless otherwise posted below  Pt will be independent with HEP to improve R shoulder AROM and strength within protocol parameters to improve functional mobility.  Baseline: 06/08/23: Provided initial HEP; 07/02/23: Independent with HEP progressing PROM.   Goal status: MET  2.  Pt will demonstrate R shoulder AROM in flexion to at least 120 degrees in supine for gradual return to overhead ADL completion. Baseline: 06/08/23: Phase 1 of current protocol (PROM); 08/05/23: 125 degrees Goal status: MET Target Date: 07/21/22 (8 weeks post op)  LONG TERM GOALS: Target date: 08/31/23 unless otherwise posted below  Pt will improve FOTO to target score to demonstrate clinically significant improvement in functional mobility. Baseline: 06/08/23: 47/68 Goal status: INITIAL  2.  Pt will demonstrate full, R shoulder AROM in all planes equal to LUE to demonstrate return to full capacity to complete ADL's.  Baseline: 06/08/23  Active ROM unless otherwise noted Right Eval PROM 11/26 Left eval  Shoulder flexion 81 165  Shoulder extension  65  Shoulder abduction  149  Shoulder adduction    Shoulder internal rotation 55 90  Shoulder external rotation 20 65  Elbow flexion WNL WNL  Elbow extension 0 WNL  Wrist flexion WNL WNL  Wrist extension WNL WNL  Wrist ulnar deviation    Wrist radial deviation    Wrist pronation WNL WNL  Wrist supination WNL WNL  (Blank rows = not tested) Goal status: INITIAL  3.  Pt will be independent with updated HEP program including periscapular and RTC strength/stability related exercises to begin return to ability to complete work related and recreational related activity.   Baseline: 06/08/23:  Goal status: INITIAL  4.  Pt will report return to full work related tasks without  strength or other reported functional deficits to demonstrate return to PLOF.  Baseline:  Goal status: INITIAL Target date: 11/28/22 (16 weeks post-op) PLAN:  PT FREQUENCY: 1-2x/week  PT DURATION: 12 weeks  PLANNED INTERVENTIONS: 97164- PT Re-evaluation, 97110-Therapeutic exercises, 97530- Therapeutic activity, 97112- Neuromuscular re-education, 97535- Self Care, 29562- Manual therapy, 97014- Electrical stimulation (unattended), [phone]- Electrical stimulation (manual), Patient/Family education, Dry Needling, Joint mobilization, Spinal manipulation, Spinal mobilization, Cryotherapy, Moist heat, and Biofeedback  PLAN FOR NEXT SESSION: Phase 3 of protocol. Progress note. Progress strength training per protocol. AROM in sitting.   Delphia Grates. Fairly IV, PT, DPT Physical Therapist- [place]  [facility]  4:12 PM, 08/11/23

## 2023-08-17 ENCOUNTER — Ambulatory Visit: Payer: PRIVATE HEALTH INSURANCE | Attending: Orthopedic Surgery

## 2023-08-17 DIAGNOSIS — M6281 Muscle weakness (generalized): Secondary | ICD-10-CM | POA: Diagnosis present

## 2023-08-17 DIAGNOSIS — M25511 Pain in right shoulder: Secondary | ICD-10-CM | POA: Insufficient documentation

## 2023-08-17 NOTE — Therapy (Signed)
 OUTPATIENT PHYSICAL THERAPY SHOULDER TREATMENT/PROGRESS NOTE  Dates of Reporting Period: 06/08/23 - 08/17/23   Patient Name: Austin Houston MRN: [id number] DOB:1960-10-28, 63 y.o., male Today's Date: 08/17/2023  END OF SESSION:  PT End of Session - 08/17/23 1604     Visit Number 10    Number of Visits 25    Date for PT Re-Evaluation 08/31/23    Authorization Type [employer] [facility]    Progress Note Due on Visit 10    PT Start Time 1602    PT Stop Time 1643    PT Time Calculation (min) 41 min    Activity Tolerance Patient tolerated treatment well    Behavior During Therapy [facility] for tasks assessed/performed             Past Medical History:  Diagnosis Date   Actinic keratosis    Allergic rhinitis due to pollen    Asthma    vs COPD- taking inhalers- as related to allergies only.   Basal cell carcinoma 03/26/2020   R lat deltoid - ED&C    Dysplastic nevus 03/07/2020   L costal infrapectoral - moderate   Dysplastic nevus 03/07/2020   R post flank above waistline - moderate   Follicular cancer of thyroid  (HCC)    s/p partial thyroidectomy (follicular adenoma)-surgery only   GERD (gastroesophageal reflux disease)    Hearing impaired person, bilateral    hearing aida bilateralhigh frequency loss   Hyperlipidemia    Hypertension    Prostate cancer (HCC) 01/08/2016   PTSD (post-traumatic stress disorder)    s/p multiple active deployments for [employer], no medications taken currently   Past Surgical History:  Procedure Laterality Date   COLONOSCOPY W/ POLYPECTOMY     age 44 adenoma   INGUINAL HERNIA REPAIR     right   LEFT HEART CATH AND CORONARY ANGIOGRAPHY N/A 07/20/2022   Procedure: LEFT HEART CATH AND CORONARY ANGIOGRAPHY;  Surgeon: Austin Sharper, MD;  Location: [facility] INVASIVE CV LAB;  Service: Cardiovascular;  Laterality: N/A;   REPLACEMENT ASCENDING AORTA N/A 09/07/2022   Procedure: REPLACEMENT ASCENDING AORTA;  Surgeon: Austin Dorise POUR, MD;  Location:  [facility] OR;  Service: Open Heart Surgery;  Laterality: N/A;  WITH CIRC ARREST   ROBOT ASSISTED LAPAROSCOPIC RADICAL PROSTATECTOMY N/A 07/23/2016   Procedure: XI ROBOTIC ASSISTED LAPAROSCOPIC RADICAL PROSTATECTOMY LEVEL 1;  Surgeon: Austin Ferrara, MD;  Location: [facility] ORS;  Service: Urology;  Laterality: N/A;   SHOULDER ARTHROSCOPY WITH ROTATOR CUFF REPAIR AND SUBACROMIAL DECOMPRESSION Right 05/27/2023   Procedure: SHOULDER ARTHROSCOPY WITH ROTATOR CUFF REPAIR AND SUBACROMIAL DECOMPRESSION;  Surgeon: Austin Soulier, MD;  Location: [facility] ORS;  Service: Orthopedics;  Laterality: Right;  NEEDS 90 MINUTES   TEE WITHOUT CARDIOVERSION N/A 09/07/2022   Procedure: TRANSESOPHAGEAL ECHOCARDIOGRAM (TEE);  Surgeon: Austin Dorise POUR, MD;  Location: [facility] OR;  Service: Open Heart Surgery;  Laterality: N/A;   THYROIDECTOMY, PARTIAL     [facility])   Patient Active Problem List   Diagnosis Date Noted   Anxiety and depression 02/21/2023   Pancytopenia (HCC) 02/21/2023   Elevated liver enzymes 02/21/2023   Alcohol  use disorder, severe 02/16/2023   S/P ascending aortic aneurysm repair 09/07/2022   Aneurysm of ascending aorta without rupture (HCC) 07/20/2022   Mild persistent asthma without complication 01/24/2020   Prostate cancer (HCC) 01/08/2016   Varicose veins of bilateral lower extremities with other complications 03/01/2013   Former smokeless tobacco use 11/25/2012   Follicular cancer of thyroid  (HCC)  GERD (gastroesophageal reflux disease)    Allergic rhinitis due to pollen    Hypertension    Hyperlipidemia    PTSD (post-traumatic stress disorder)     PCP: Watt Mirza, MD  REFERRING PROVIDER: Eva MICAEL Herring  REFERRING DIAG: RIGHT SHOULDER ROTATOR CUFF TEAR   THERAPY DIAG:  Acute pain of right shoulder  Muscle weakness (generalized)  Rationale for Evaluation and Treatment: Rehabilitation  ONSET DATE: 05/27/23  SUBJECTIVE:                                                                                                                                                                                       SUBJECTIVE STATEMENT: Reports his shoulder continues to do well. Had f/u with surgeon and got good report.  A lot of soreness with AROM in sitting. Admits to not taking rest days.   Hand dominance: Right  PERTINENT HISTORY: Pt is a pleasant 63 y.o. male referred to OPPT s/p R RTC tear repair and subacromial decompression 05/27/23. 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 Happened December 5th, 2023. Waited for his RTC surgery as he had open heart surgery for ascending aortic aneurysm in February 2024. Pain is minimal, controlled via Tylenol . R handed. Pt works in [employer] lab at [employer] requiring BUE use such as mobilizing patients, transfers, and use of equipment for performing catheterizations.   PAIN:  Are you having pain? None on arrival; noted soreness during end-range stretches and soreness at end of session.   PRECAUTIONS: Other: RTC repair protocol  Weeks Six-Twelve Precautions: -no lifting -no supporting BW with hands and arms -no sudden jerking -no excessive behind the back movements -avoid upper extremity ergometer    RED FLAGS: None   WEIGHT BEARING RESTRICTIONS: No  FALLS:  Has patient fallen in last 6 months? No  LIVING ENVIRONMENT: Lives with: lives with their spouse Lives in: House/apartment  OCCUPATION: Cardiovascular Specialist  PLOF: Independent  PATIENT GOALS:Return to occupation, return to PLOF.  NEXT MD VISIT: N/A  OBJECTIVE:  Note: Objective measures were completed at Evaluation unless otherwise noted.  DIAGNOSTIC FINDINGS:  N/A  PATIENT SURVEYS:  FOTO 47/68  COGNITION: Overall cognitive status: Within functional limits for tasks assessed     SENSATION: WFL  POSTURE: Mildly rounded shoulders  CERVICAL AROM: Flexion: 70 Extension: 55 Rotation R/L: 60/70 Lateral flexion R/L:  45/45  UPPER EXTREMITY ROM:   Active ROM unless otherwise noted Right Eval PROM 11/26 Left eval  Shoulder flexion 81 165  Shoulder extension  65  Shoulder abduction  149  Shoulder adduction    Shoulder internal rotation 55 90  Shoulder external rotation 20 65  Elbow flexion WNL WNL  Elbow extension 0 WNL  Wrist flexion WNL WNL  Wrist extension WNL WNL  Wrist ulnar deviation    Wrist radial deviation    Wrist pronation WNL WNL  Wrist supination WNL WNL  (Blank rows = not tested)  UPPER EXTREMITY MMT:  MMT Right Eval Left Eval  Shoulder flexion deferred 5  Shoulder extension deferred   Shoulder abduction deferred 5  Shoulder adduction    Shoulder internal rotation deferred 5  Shoulder external rotation deferred 5  Middle trapezius    Lower trapezius    Elbow flexion  5  Elbow extension  4 (seated)   TODAY'S TREATMENT:                                                                                                                                         DATE: 08/17/23  There.Act:   Reviewed progress towards goals:   FOTO: 59/68   See progress towards goals in goals and clinical impression for details.   Seated exercise with bands:   Blue TB B shoulder ER + scap retraction: 2x8    Standing R shoulder IR: blue TB: 4x12. Excellent form/technique.   Prone on RUE:   Row: 2x12, 5# DB  Shoulder extension: 2x12, 5# DB   T row: 2x12     Provided Updated HEP with education on reps/sets/frequency and not exceeding 5# weight limit.    PATIENT EDUCATION: Education details: Updated precautions/restrictions/HEP per phase 2 protocol Person educated: Patient Education method: Explanation Education comprehension: verbalized understanding, returned demonstration, verbal cues required, and tactile cues required  HOME EXERCISE PROGRAM: Access Code: [email or web address] URL: [email or web address] Date: 08/17/2023 Prepared by: Dorina Kingfisher  Exercises - Seated  Single Arm Shoulder Scaption  - 1 x daily - 3-4 x weekly - 2 sets - 12 reps - Seated Shoulder External Rotation  - 1 x daily - 3-4 x weekly - 2 sets - 12 reps - Seated Shoulder Abduction with Bent Elbow  - 1 x daily - 3-4 x weekly - 2 sets - 12 reps - Prone Shoulder Row  - 3 x daily - 1 sets - 10 reps - Supine Shoulder Flexion Extension AAROM with Dowel  - 1 x daily - 4-5 x weekly - 3 sets - 12 reps - Sidelying Shoulder External Rotation  - 1 x daily - 3 x weekly - 2 sets - 12 reps - Prone Shoulder Row  - 1 x daily - 3 x weekly - 2 sets - 12 reps - Prone Shoulder Extension - Single Arm  - 1 x daily - 3 x weekly - 2 sets - 12 reps - Shoulder Internal Rotation with Resistance  - 1 x daily - 2-3 x weekly - 3 sets - 12 reps - Shoulder External Rotation and Scapular Retraction with Resistance  - 1 x daily - 2-3  x weekly - 3 sets - 12 reps - Standing Row with Resistance with Anchored Resistance at Chest Height Palms Down  - 1 x daily - 2-3 x weekly - 3 sets - 8 reps   Access Code: [email or web address] URL: [email or web address] Date: 08/11/2023 Prepared by: Dorina Kingfisher  Exercises - Seated Single Arm Shoulder Scaption  - 1 x daily - 3-4 x weekly - 2 sets - 12 reps - Seated Shoulder External Rotation  - 1 x daily - 3-4 x weekly - 2 sets - 12 reps - Seated Shoulder Abduction with Bent Elbow  - 1 x daily - 3-4 x weekly - 2 sets - 12 reps - Prone Shoulder Row  - 3 x daily - 1 sets - 10 reps - Supine Shoulder Flexion Extension AAROM with Dowel  - 1 x daily - 4-5 x weekly - 3 sets - 12 reps - Sidelying Shoulder External Rotation  - 1 x daily - 3 x weekly - 2 sets - 12 reps - Prone Shoulder Row  - 1 x daily - 3 x weekly - 2 sets - 12 reps - Prone Shoulder Extension - Single Arm  - 1 x daily - 3 x weekly - 2 sets - 12 reps  Access Code: [email or web address] URL: [email or web address] Date: 08/05/2023 Prepared by: Dorina Kingfisher  Exercises - Prone Shoulder Row  - 3 x daily - 1 sets - 10  reps - Isometric Shoulder Adduction  - 3 x daily - 1 sets - 10 reps - 3 sec hold - Isometric Shoulder Abduction at Wall  - 3 x daily - 1 sets - 10 reps - 3 sec hold - Standing Isometric Shoulder External Rotation with Doorway  - 3 x daily - 1 sets - 10 reps - 3 sec hold - Standing Isometric Shoulder Flexion with Doorway - Arm Bent  - 3 x daily - 1 sets - 10 reps - 3 sec hold - Standing Isometric Shoulder Internal Rotation at Doorway  - 3 x daily - 1 sets - 10 reps - 3 sec hold - Supine Shoulder Flexion Extension AAROM with Dowel  - 1 x daily - 4-5 x weekly - 3 sets - 12 reps - Sidelying Shoulder External Rotation  - 1 x daily - 3 x weekly - 2 sets - 12 reps - Prone Shoulder Row  - 1 x daily - 3 x weekly - 2 sets - 12 reps - Prone Shoulder Extension - Single Arm  - 1 x daily - 3 x weekly - 2 sets - 12 reps - Prone Shoulder Horizontal Abduction  - 1 x daily - 3 x weekly - 2 sets - 12 reps - Supine Shoulder Flexion Extension Full Range AROM  - 1 x daily - 7 x weekly - 2 sets - 15 reps - Supine Shoulder Abduction AROM  - 1 x daily - 7 x weekly - 2 sets - 15 reps   Access Code: [email or web address] URL: [email or web address] Date: 07/09/2023 Prepared by: Peggye Linear  Exercises - Seated Scapular Retraction  - 3 x daily - 1 sets - 15 reps - Seated Shoulder Shrugs  - 3 x daily - 1 sets - 15 reps - Seated Shoulder Flexion Towel Slide at Table Top  - 3 x daily - 1 sets - 15-20 reps - Seated Shoulder Abduction Towel Slide at Table Top  - 1 x daily - 1 sets - 15-20 reps - Prone Shoulder Row  - 3 x daily - 1 sets - 10  reps - Isometric Shoulder Adduction  - 3 x daily - 1 sets - 10 reps - 3 sec hold - Isometric Shoulder Abduction at Wall  - 3 x daily - 1 sets - 10 reps - 3 sec hold - Standing Isometric Shoulder External Rotation with Doorway  - 3 x daily - 1 sets - 10 reps - 3 sec hold - Standing Isometric Shoulder Flexion with Doorway - Arm Bent  - 3 x daily - 1 sets - 10 reps - 3 sec hold -  Standing Isometric Shoulder Internal Rotation at Doorway  - 3 x daily - 1 sets - 10 reps - 3 sec hold  ASSESSMENT:  CLINICAL IMPRESSION: Pt on 10th visit warranting progress note. Pt making good progress in functional mobiltiy as evidenced by FOTO score. Able to achieve 125 degrees forward felxion but remains limited to 90-91 degrees in abduction due to pain. Able to progress to resisted RTC exercises today and prone periscapular strengthening. Encouraged AAROM to end ranges to maintain full ranges of motion and for scapulohumeral rhythm. Discussed importance of rest days as well to encouraged soft tissue recovery. Pt understanding. Pt remains with AROM deficits in flexion and abduction and with RUE weakness. Patient's condition has the potential to improve in response to therapy. Maximum improvement is yet to be obtained. The anticipated improvement is attainable and reasonable in a generally predictable time. Pt will require and benefit from skilled PT to address his deficits and maximize return of R shoulder AROM and strength to PLOF.    OBJECTIVE IMPAIRMENTS: decreased mobility, decreased ROM, decreased strength, impaired UE functional use, postural dysfunction, and pain.   ACTIVITY LIMITATIONS: carrying, lifting, bathing, toileting, dressing, reach over head, and hygiene/grooming  PARTICIPATION LIMITATIONS: cleaning, community activity, occupation, and yard work  PERSONAL FACTORS: Age, Education, Fitness, Past/current experiences, Profession, and Time since onset of injury/illness/exacerbation are also affecting patient's functional outcome.   REHAB POTENTIAL: Good  CLINICAL DECISION MAKING: Stable/uncomplicated  EVALUATION COMPLEXITY: Low   GOALS: Goals reviewed with patient? No  SHORT TERM GOALS: Target date: 07/06/23 unless otherwise posted below  Pt will be independent with HEP to improve R shoulder AROM and strength within protocol parameters to improve functional mobility.   Baseline: 06/08/23: Provided initial HEP; 07/02/23: Independent with HEP progressing PROM.   Goal status: MET  2.  Pt will demonstrate R shoulder AROM in flexion to at least 120 degrees in supine for gradual return to overhead ADL completion. Baseline: 06/08/23: Phase 1 of current protocol (PROM); 08/05/23: 125 degrees Goal status: MET Target Date: 07/21/22 (8 weeks post op)  LONG TERM GOALS: Target date: 08/31/23 unless otherwise posted below  Pt will improve FOTO to target score to demonstrate clinically significant improvement in functional mobility. Baseline: 06/08/23: 47/68; 08/17/23: 59/68 Goal status: ON GOING  2.  Pt will demonstrate full, R shoulder AROM in all planes equal to LUE to demonstrate return to full capacity to complete ADL's.  Baseline: 06/08/23  Active ROM unless otherwise noted Right Eval PROM 11/26 Left eval Right  AROM Seated 08/17/23  Shoulder flexion 81 165 125  Shoulder extension  65   Shoulder abduction  149 91  Shoulder adduction     Shoulder internal rotation 55 90   Shoulder external rotation 20 65 60  Elbow flexion WNL WNL   Elbow extension 0 WNL   Wrist flexion WNL WNL   Wrist extension WNL WNL   Wrist ulnar deviation     Wrist radial deviation  Wrist pronation WNL WNL   Wrist supination WNL WNL   (Blank rows = not tested) Goal status: INITIAL  3.  Pt will be independent with updated HEP program including periscapular and RTC strength/stability related exercises to begin return to ability to complete work related and recreational related activity.   Baseline: 06/08/23: ; 08/17/23: independent with progressive protocol Goal status: MET  4.  Pt will report return to full work related tasks without strength or other reported functional deficits to demonstrate return to PLOF.  Baseline:  Goal status: INITIAL Target date: 11/28/22 (16 weeks post-op) PLAN:  PT FREQUENCY: 1-2x/week  PT DURATION: 12 weeks  PLANNED INTERVENTIONS: 97164- PT  Re-evaluation, 97110-Therapeutic exercises, 97530- Therapeutic activity, 97112- Neuromuscular re-education, 97535- Self Care, [id number]- Manual therapy, 97014- Electrical stimulation (unattended), [phone]- Electrical stimulation (manual), Patient/Family education, Dry Needling, Joint mobilization, Spinal manipulation, Spinal mobilization, Cryotherapy, Moist heat, and Biofeedback  PLAN FOR NEXT SESSION: Phase 3 of protocol. Review band exercises and prone DB strength training per protocol. AROM in sitting.   Dorina HERO. Fairly IV, PT, DPT Physical Therapist- [place]  4:45 PM, 08/17/23

## 2023-08-19 ENCOUNTER — Ambulatory Visit: Payer: PRIVATE HEALTH INSURANCE

## 2023-08-31 ENCOUNTER — Ambulatory Visit: Payer: PRIVATE HEALTH INSURANCE

## 2023-08-31 ENCOUNTER — Ambulatory Visit: Payer: 59 | Admitting: Dermatology

## 2023-08-31 DIAGNOSIS — M6281 Muscle weakness (generalized): Secondary | ICD-10-CM

## 2023-08-31 DIAGNOSIS — M25511 Pain in right shoulder: Secondary | ICD-10-CM

## 2023-08-31 NOTE — Therapy (Signed)
 OUTPATIENT PHYSICAL THERAPY SHOULDER TREATMENT/RECERT  Patient Name: Austin Houston MRN: [id number] DOB:03/26/1961, 63 y.o., male Today's Date: 08/31/2023  END OF SESSION:  PT End of Session - 08/31/23 1604     Visit Number 11    Number of Visits 25    Date for PT Re-Evaluation 10/26/23    Authorization Type [facility]    Progress Note Due on Visit 10    PT Start Time 1602    PT Stop Time 1645    PT Time Calculation (min) 43 min    Activity Tolerance Patient tolerated treatment well    Behavior During Therapy WFL for tasks assessed/performed             Past Medical History:  Diagnosis Date   Actinic keratosis    Allergic rhinitis due to pollen    Asthma    vs COPD- taking inhalers- as related to allergies only.   Basal cell carcinoma 03/26/2020   R lat deltoid - ED&C    Dysplastic nevus 03/07/2020   L costal infrapectoral - moderate   Dysplastic nevus 03/07/2020   R post flank above waistline - moderate   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HCC)    s/p partial thyroidectomy (follicular adenoma)-surgery only   GERD (gastroesophageal reflux disease)    Hearing impaired person, bilateral    hearing aida bilateral"high frequency loss   Hyperlipidemia    Hypertension    Prostate cancer (HCC) 01/08/2016   PTSD (post-traumatic stress disorder)    s/p multiple active deployments for [employer], no medications taken currently   Past Surgical History:  Procedure Laterality Date   COLONOSCOPY W/ POLYPECTOMY     age 76 "adenoma"   INGUINAL HERNIA REPAIR     right   LEFT HEART CATH AND CORONARY ANGIOGRAPHY N/A 07/20/2022   Procedure: LEFT HEART CATH AND CORONARY ANGIOGRAPHY;  Surgeon: Tonny Bollman, MD;  Location: [facility] INVASIVE CV LAB;  Service: Cardiovascular;  Laterality: N/A;   REPLACEMENT ASCENDING AORTA N/A 09/07/2022   Procedure: REPLACEMENT ASCENDING AORTA;  Surgeon: Alleen Borne, MD;  Location: [facility] OR;  Service: Open Heart Surgery;  Laterality: N/A;   WITH CIRC ARREST   ROBOT ASSISTED LAPAROSCOPIC RADICAL PROSTATECTOMY N/A 07/23/2016   Procedure: XI ROBOTIC ASSISTED LAPAROSCOPIC RADICAL PROSTATECTOMY LEVEL 1;  Surgeon: Heloise Purpura, MD;  Location: [facility] ORS;  Service: Urology;  Laterality: N/A;   SHOULDER ARTHROSCOPY WITH ROTATOR CUFF REPAIR AND SUBACROMIAL DECOMPRESSION Right 05/27/2023   Procedure: SHOULDER ARTHROSCOPY WITH ROTATOR CUFF REPAIR AND SUBACROMIAL DECOMPRESSION;  Surgeon: Jones Broom, MD;  Location: [facility] ORS;  Service: Orthopedics;  Laterality: Right;  NEEDS 90 MINUTES   TEE WITHOUT CARDIOVERSION N/A 09/07/2022   Procedure: TRANSESOPHAGEAL ECHOCARDIOGRAM (TEE);  Surgeon: Alleen Borne, MD;  Location[facility] OR;  Service: Open Heart Surgery;  Laterality: N/A;   THYROIDECTOMY, PARTIAL     [facility])   Patient Active Problem List   Diagnosis Date Noted   Anxiety and depression 02/21/2023   Pancytopenia (HCC) 02/21/2023   Elevated liver enzymes 02/21/2023   Alcohol use disorder, severe 02/16/2023   S/P ascending aortic aneurysm repair 09/07/2022   Aneurysm of ascending aorta without rupture (HCC) 07/20/2022   Mild persistent asthma without complication 01/24/2020   Prostate cancer (HCC) 01/08/2016   Varicose veins of bilateral lower extremities with other complications 03/01/2013   Former smokeless tobacco use 11/25/2012   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HCC)    GERD (gastroesophageal reflux disease)    Allergic rhinitis due  to pollen    Hypertension    Hyperlipidemia    PTSD (post-traumatic stress disorder)     PCP: Hannah Beat, MD  REFERRING PROVIDER: Berline Lopes  REFERRING DIAG: RIGHT SHOULDER ROTATOR CUFF TEAR   THERAPY DIAG:  Acute pain of right shoulder  Muscle weakness (generalized)  Rationale for Evaluation and Treatment: Rehabilitation  ONSET DATE: 05/27/23  SUBJECTIVE:                                                                                                                                                                                       SUBJECTIVE STATEMENT: Reports his shoulder continues to do well. Been compliant with strength HEP. Has had soreness but no pain.   Hand dominance: Right  PERTINENT HISTORY: Pt is a pleasant 63 y.o. male referred to OPPT s/p R RTC tear repair and subacromial decompression 05/27/23. Pt reports having a fall at work in the [employer] onto his R shoulder. Happened December 5th, 2023. Waited for his RTC surgery as he had open heart surgery for ascending aortic aneurysm in February 2024. Pain is minimal, controlled via Tylenol. R handed. Pt works in [employer] lab at [employer] requiring BUE use such as mobilizing patients, transfers, and use of equipment for performing catheterizations.   PAIN:  Are you having pain? None on arrival; noted soreness during end-range stretches and soreness at end of session.   PRECAUTIONS: Other: RTC repair protocol  Weeks Six-Twelve Precautions: -no lifting -no supporting BW with hands and arms -no sudden jerking -no excessive behind the back movements -avoid upper extremity ergometer    RED FLAGS: None   WEIGHT BEARING RESTRICTIONS: No  FALLS:  Has patient fallen in last 6 months? No  LIVING ENVIRONMENT: Lives with: lives with their spouse Lives in: House/apartment  OCCUPATION: Cardiovascular Specialist  PLOF: Independent  PATIENT GOALS:Return to occupation, return to PLOF.  NEXT MD VISIT: N/A  OBJECTIVE:  Note: Objective measures were completed at Evaluation unless otherwise noted.  DIAGNOSTIC FINDINGS:  N/A  PATIENT SURVEYS:  FOTO 47/68  COGNITION: Overall cognitive status: Within functional limits for tasks assessed     SENSATION: WFL  POSTURE: Mildly rounded shoulders  CERVICAL AROM: Flexion: 70 Extension: 55 Rotation R/L: 60/70 Lateral flexion R/L: 45/45  UPPER EXTREMITY ROM:   Active ROM unless otherwise noted Right Eval PROM 11/26 Left eval  Shoulder  flexion 81 165  Shoulder extension  65  Shoulder abduction  149  Shoulder adduction    Shoulder internal rotation 55 90  Shoulder external rotation 20 65  Elbow flexion WNL WNL  Elbow extension 0 WNL  Wrist flexion WNL WNL  Wrist extension WNL WNL  Wrist ulnar deviation    Wrist radial deviation    Wrist pronation WNL WNL  Wrist supination WNL WNL  (Blank rows = not tested)  UPPER EXTREMITY MMT:  MMT Right Eval Left Eval  Shoulder flexion deferred 5  Shoulder extension deferred   Shoulder abduction deferred 5  Shoulder adduction    Shoulder internal rotation deferred 5  Shoulder external rotation deferred 5  Middle trapezius    Lower trapezius    Elbow flexion  5  Elbow extension  4 (seated)   TODAY'S TREATMENT:                                                                                                                                         DATE: 08/31/23  There.Act:   Reviewed progress towards goals:   See progress towards goals in goals and clinical impression for details.   Blue TB RUE shoulder ER/IR: 3x8/direction   Standing 4# RUE scaption: 3x8  Standing R shoulder abduction short lever arm: 4# DB, 2x8. VC's to minimize R upper trap shrug.   Standing red physioball walk outs overhead for RTC endurance. 4x 20' hallway.   Hook lying shoulder flexion/extension AAROM: dowel with 3# AW. 2x12, reaching 155 degrees flexion   Provided updated HEP. Reviewed reps/sets/frequency  PATIENT EDUCATION: Education details: Updated precautions/restrictions/HEP per phase 2 protocol Person educated: Patient Education method: Explanation Education comprehension: verbalized understanding, returned demonstration, verbal cues required, and tactile cues required  HOME EXERCISE PROGRAM: Access Code: [email or web address] URL: [email or web address] Date: 08/31/2023 Prepared by: Ronnie Derby  Exercises - Seated Shoulder Abduction with Bent Elbow  - 1 x daily - 3-4 x  weekly - 2 sets - 12 reps - Prone Shoulder Row  - 3 x daily - 1 sets - 10 reps - Supine Shoulder Flexion Extension AAROM with Dowel  - 1 x daily - 4-5 x weekly - 3 sets - 12 reps - Prone Shoulder Row  - 1 x daily - 3 x weekly - 2 sets - 12 reps - Prone Shoulder Extension - Single Arm  - 1 x daily - 3 x weekly - 2 sets - 12 reps - Shoulder Internal Rotation with Resistance  - 1 x daily - 2-3 x weekly - 3 sets - 12 reps - Standing Row with Resistance with Anchored Resistance at Chest Height Palms Down  - 1 x daily - 2-3 x weekly - 3 sets - 8 reps - Single Arm Scaption with Resistance  - 1 x daily - 3-4 x weekly - 3 sets - 8 reps - Standing Shoulder Horizontal Abduction with Resistance  - 1 x daily - 3-4 x weekly - 3 sets - 8 reps - Shoulder External Rotation with Anchored Resistance  - 1 x daily - 3-4 x weekly - 3 sets - 8 reps   Access Code: [email or web address] URL: [email or web address] Date: 08/17/2023  Prepared by: Ronnie Derby  Exercises - Seated Single Arm Shoulder Scaption  - 1 x daily - 3-4 x weekly - 2 sets - 12 reps - Seated Shoulder External Rotation  - 1 x daily - 3-4 x weekly - 2 sets - 12 reps - Seated Shoulder Abduction with Bent Elbow  - 1 x daily - 3-4 x weekly - 2 sets - 12 reps - Prone Shoulder Row  - 3 x daily - 1 sets - 10 reps - Supine Shoulder Flexion Extension AAROM with Dowel  - 1 x daily - 4-5 x weekly - 3 sets - 12 reps - Sidelying Shoulder External Rotation  - 1 x daily - 3 x weekly - 2 sets - 12 reps - Prone Shoulder Row  - 1 x daily - 3 x weekly - 2 sets - 12 reps - Prone Shoulder Extension - Single Arm  - 1 x daily - 3 x weekly - 2 sets - 12 reps - Shoulder Internal Rotation with Resistance  - 1 x daily - 2-3 x weekly - 3 sets - 12 reps - Shoulder External Rotation and Scapular Retraction with Resistance  - 1 x daily - 2-3 x weekly - 3 sets - 12 reps - Standing Row with Resistance with Anchored Resistance at Chest Height Palms Down  - 1 x daily - 2-3 x  weekly - 3 sets - 8 reps   Access Code: [email or web address] URL: [email or web address] Date: 08/11/2023 Prepared by: Ronnie Derby  Exercises - Seated Single Arm Shoulder Scaption  - 1 x daily - 3-4 x weekly - 2 sets - 12 reps - Seated Shoulder External Rotation  - 1 x daily - 3-4 x weekly - 2 sets - 12 reps - Seated Shoulder Abduction with Bent Elbow  - 1 x daily - 3-4 x weekly - 2 sets - 12 reps - Prone Shoulder Row  - 3 x daily - 1 sets - 10 reps - Supine Shoulder Flexion Extension AAROM with Dowel  - 1 x daily - 4-5 x weekly - 3 sets - 12 reps - Sidelying Shoulder External Rotation  - 1 x daily - 3 x weekly - 2 sets - 12 reps - Prone Shoulder Row  - 1 x daily - 3 x weekly - 2 sets - 12 reps - Prone Shoulder Extension - Single Arm  - 1 x daily - 3 x weekly - 2 sets - 12 reps  Access Code: [email or web address] URL: [email or web address] Date: 08/05/2023 Prepared by: Ronnie Derby  Exercises - Prone Shoulder Row  - 3 x daily - 1 sets - 10 reps - Isometric Shoulder Adduction  - 3 x daily - 1 sets - 10 reps - 3 sec hold - Isometric Shoulder Abduction at Wall  - 3 x daily - 1 sets - 10 reps - 3 sec hold - Standing Isometric Shoulder External Rotation with Doorway  - 3 x daily - 1 sets - 10 reps - 3 sec hold - Standing Isometric Shoulder Flexion with Doorway - Arm Bent  - 3 x daily - 1 sets - 10 reps - 3 sec hold - Standing Isometric Shoulder Internal Rotation at Doorway  - 3 x daily - 1 sets - 10 reps - 3 sec hold - Supine Shoulder Flexion Extension AAROM with Dowel  - 1 x daily - 4-5 x weekly - 3 sets - 12 reps - Sidelying Shoulder External Rotation  - 1 x daily - 3 x weekly - 2 sets -  12 reps - Prone Shoulder Row  - 1 x daily - 3 x weekly - 2 sets - 12 reps - Prone Shoulder Extension - Single Arm  - 1 x daily - 3 x weekly - 2 sets - 12 reps - Prone Shoulder Horizontal Abduction  - 1 x daily - 3 x weekly - 2 sets - 12 reps - Supine Shoulder Flexion Extension Full Range AROM  -  1 x daily - 7 x weekly - 2 sets - 15 reps - Supine Shoulder Abduction AROM  - 1 x daily - 7 x weekly - 2 sets - 15 reps   Access Code: [email or web address] URL: [email or web address] Date: 07/09/2023 Prepared by: Alvera Novel  Exercises - Seated Scapular Retraction  - 3 x daily - 1 sets - 15 reps - Seated Shoulder Shrugs  - 3 x daily - 1 sets - 15 reps - Seated Shoulder Flexion Towel Slide at Table Top  - 3 x daily - 1 sets - 15-20 reps - Seated Shoulder Abduction Towel Slide at Table Top  - 1 x daily - 1 sets - 15-20 reps - Prone Shoulder Row  - 3 x daily - 1 sets - 10 reps - Isometric Shoulder Adduction  - 3 x daily - 1 sets - 10 reps - 3 sec hold - Isometric Shoulder Abduction at Wall  - 3 x daily - 1 sets - 10 reps - 3 sec hold - Standing Isometric Shoulder External Rotation with Doorway  - 3 x daily - 1 sets - 10 reps - 3 sec hold - Standing Isometric Shoulder Flexion with Doorway - Arm Bent  - 3 x daily - 1 sets - 10 reps - 3 sec hold - Standing Isometric Shoulder Internal Rotation at Doorway  - 3 x daily - 1 sets - 10 reps - 3 sec hold  ASSESSMENT:  CLINICAL IMPRESSION: Pt is s/p 13 weeks RTC to supraspinatus tendon. Pt at end of current POC thus goals assessed. Pt has made improvements in FOTO score and improved R shoulder AROM to 140 degrees flexion and 115 degrees abduction indicating improved functional mobility. Pt remains unable to work due to healing timelines but is progressing well with strength training. Pt continues to demonstrate limitations in RUE strength and AROM limitations limiting full return to ADL's and work tasks. PT to request additional 8 weeks of PT 2x/week to maximize functional capacity and return to PLOF.   OBJECTIVE IMPAIRMENTS: decreased mobility, decreased ROM, decreased strength, impaired UE functional use, postural dysfunction, and pain.   ACTIVITY LIMITATIONS: carrying, lifting, bathing, toileting, dressing, reach over head, and  hygiene/grooming  PARTICIPATION LIMITATIONS: cleaning, community activity, occupation, and yard work  PERSONAL FACTORS: Age, Education, Fitness, Past/current experiences, Profession, and Time since onset of injury/illness/exacerbation are also affecting patient's functional outcome.   REHAB POTENTIAL: Good  CLINICAL DECISION MAKING: Stable/uncomplicated  EVALUATION COMPLEXITY: Low   GOALS: Goals reviewed with patient? No  SHORT TERM GOALS: Target date: 07/06/23 unless otherwise posted below  Pt will be independent with HEP to improve R shoulder AROM and strength within protocol parameters to improve functional mobility.  Baseline: 06/08/23: Provided initial HEP; 07/02/23: Independent with HEP progressing PROM.   Goal status: MET  2.  Pt will demonstrate R shoulder AROM in flexion to at least 120 degrees in supine for gradual return to overhead ADL completion. Baseline: 06/08/23: Phase 1 of current protocol (PROM); 08/05/23: 125 degrees Goal status: MET Target Date: 07/21/22 (8 weeks post op)  LONG  TERM GOALS: Target date: 10/26/23 unless otherwise posted below  Pt will improve FOTO to target score to demonstrate clinically significant improvement in functional mobility. Baseline: 06/08/23: 47/68; 08/17/23: 16/10; 08/31/23: 64/68 Goal status: ON GOING  2.  Pt will demonstrate full, R shoulder AROM in all planes equal to LUE to demonstrate return to full capacity to complete ADL's.  Baseline: 06/08/23  Active ROM unless otherwise noted Right Eval PROM 11/26 Left eval Right  AROM Seated 08/17/23 Right  AROM Seated 08/31/23  Shoulder flexion 81 165 125 140  Shoulder extension  65    Shoulder abduction  149 91 115  Shoulder adduction      Shoulder internal rotation 55 90    Shoulder external rotation 20 65 60 60  Elbow flexion WNL WNL    Elbow extension 0 WNL    Wrist flexion WNL WNL    Wrist extension WNL WNL    Wrist ulnar deviation      Wrist radial deviation      Wrist  pronation WNL WNL    Wrist supination WNL WNL    (Blank rows = not tested) Goal status: INITIAL  3.  Pt will be independent with updated HEP program including periscapular and RTC strength/stability related exercises to begin return to ability to complete work related and recreational related activity.   Baseline: 06/08/23: ; 08/17/23: independent with progressive protocol Goal status: MET  4.  Pt will report return to full work related tasks without strength or other reported functional deficits to demonstrate return to PLOF.  Baseline:  Goal status: INITIAL Target date: 11/28/22 (16 weeks post-op) PLAN:  PT FREQUENCY: 1-2x/week  PT DURATION: 8 weeks  PLANNED INTERVENTIONS: 97164- PT Re-evaluation, 97110-Therapeutic exercises, 97530- Therapeutic activity, 97112- Neuromuscular re-education, 97535- Self Care, 96045- Manual therapy, 97014- Electrical stimulation (unattended), [phone]- Electrical stimulation (manual), Patient/Family education, Dry Needling, Joint mobilization, Spinal manipulation, Spinal mobilization, Cryotherapy, Moist heat, and Biofeedback  PLAN FOR NEXT SESSION: strength training to protocol standards. Extension and behind back AROM. End range flexion and abduction.  Delphia Grates. Fairly IV, PT, DPT Physical Therapist- [place]  [facility]  8:49 PM, 08/31/23

## 2023-09-02 ENCOUNTER — Ambulatory Visit: Payer: PRIVATE HEALTH INSURANCE

## 2023-09-02 DIAGNOSIS — M6281 Muscle weakness (generalized): Secondary | ICD-10-CM

## 2023-09-02 DIAGNOSIS — M25511 Pain in right shoulder: Secondary | ICD-10-CM

## 2023-09-02 NOTE — Therapy (Unsigned)
 OUTPATIENT PHYSICAL THERAPY SHOULDER TREATMENT/RECERT  Patient Name: Austin Houston MRN: [id number] DOB:05-Mar-1961, 63 y.o., male Today's Date: 09/02/2023  END OF SESSION:  PT End of Session - 09/02/23 1631     Visit Number 12    Number of Visits 25    Date for PT Re-Evaluation 10/26/23    Authorization Type [facility]    Progress Note Due on Visit 10    PT Start Time 1600    PT Stop Time 1644    PT Time Calculation (min) 44 min    Activity Tolerance Patient tolerated treatment well    Behavior During Therapy WFL for tasks assessed/performed             Past Medical History:  Diagnosis Date   Actinic keratosis    Allergic rhinitis due to pollen    Asthma    vs COPD- taking inhalers- as related to allergies only.   Basal cell carcinoma 03/26/2020   R lat deltoid - ED&C    Dysplastic nevus 03/07/2020   L costal infrapectoral - moderate   Dysplastic nevus 03/07/2020   R post flank above waistline - moderate   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HCC)    s/p partial thyroidectomy (follicular adenoma)-surgery only   GERD (gastroesophageal reflux disease)    Hearing impaired person, bilateral    hearing aida bilateral"high frequency loss   Hyperlipidemia    Hypertension    Prostate cancer (HCC) 01/08/2016   PTSD (post-traumatic stress disorder)    s/p multiple active deployments for [employer], no medications taken currently   Past Surgical History:  Procedure Laterality Date   COLONOSCOPY W/ POLYPECTOMY     age 11 "adenoma"   INGUINAL HERNIA REPAIR     right   LEFT HEART CATH AND CORONARY ANGIOGRAPHY N/A 07/20/2022   Procedure: LEFT HEART CATH AND CORONARY ANGIOGRAPHY;  Surgeon: Tonny Bollman, MD;  Location: [facility] INVASIVE CV LAB;  Service: Cardiovascular;  Laterality: N/A;   REPLACEMENT ASCENDING AORTA N/A 09/07/2022   Procedure: REPLACEMENT ASCENDING AORTA;  Surgeon: Alleen Borne, MD;  Location: [facility] OR;  Service: Open Heart Surgery;  Laterality: N/A;   WITH CIRC ARREST   ROBOT ASSISTED LAPAROSCOPIC RADICAL PROSTATECTOMY N/A 07/23/2016   Procedure: XI ROBOTIC ASSISTED LAPAROSCOPIC RADICAL PROSTATECTOMY LEVEL 1;  Surgeon: Heloise Purpura, MD;  Location: [facility] ORS;  Service: Urology;  Laterality: N/A;   SHOULDER ARTHROSCOPY WITH ROTATOR CUFF REPAIR AND SUBACROMIAL DECOMPRESSION Right 05/27/2023   Procedure: SHOULDER ARTHROSCOPY WITH ROTATOR CUFF REPAIR AND SUBACROMIAL DECOMPRESSION;  Surgeon: Jones Broom, MD;  Location: [facility] ORS;  Service: Orthopedics;  Laterality: Right;  NEEDS 90 MINUTES   TEE WITHOUT CARDIOVERSION N/A 09/07/2022   Procedure: TRANSESOPHAGEAL ECHOCARDIOGRAM (TEE);  Surgeon: Alleen Borne, MD;  Location: [facility] OR;  Service: Open Heart Surgery;  Laterality: N/A;   THYROIDECTOMY, PARTIAL     [facility])   Patient Active Problem List   Diagnosis Date Noted   Anxiety and depression 02/21/2023   Pancytopenia (HCC) 02/21/2023   Elevated liver enzymes 02/21/2023   Alcohol use disorder, severe 02/16/2023   S/P ascending aortic aneurysm repair 09/07/2022   Aneurysm of ascending aorta without rupture (HCC) 07/20/2022   Mild persistent asthma without complication 01/24/2020   Prostate cancer (HCC) 01/08/2016   Varicose veins of bilateral lower extremities with other complications 03/01/2013   Former smokeless tobacco use 11/25/2012   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HCC)    GERD (gastroesophageal reflux disease)    Allergic rhinitis due  to pollen    Hypertension    Hyperlipidemia    PTSD (post-traumatic stress disorder)     PCP: Hannah Beat, MD  REFERRING PROVIDER: Berline Lopes  REFERRING DIAG: RIGHT SHOULDER ROTATOR CUFF TEAR   THERAPY DIAG:  Acute pain of right shoulder  Muscle weakness (generalized)  Rationale for Evaluation and Treatment: Rehabilitation  ONSET DATE: 05/27/23  SUBJECTIVE:                                                                                                                                                                                       SUBJECTIVE STATEMENT: Reports his shoulder continues to do well. Been compliant with strength HEP. Has had soreness but no pain.   Hand dominance: Right  PERTINENT HISTORY: Pt is a pleasant 63 y.o. male referred to OPPT s/p R RTC tear repair and subacromial decompression 05/27/23. Pt reports having a fall at work in the [employer] onto his R shoulder. Happened December 5th, 2023. Waited for his RTC surgery as he had open heart surgery for ascending aortic aneurysm in February 2024. Pain is minimal, controlled via Tylenol. R handed. Pt works in [employer] lab at [employer] requiring BUE use such as mobilizing patients, transfers, and use of equipment for performing catheterizations.   PAIN:  Are you having pain? None on arrival; noted soreness during end-range stretches and soreness at end of session.   PRECAUTIONS: Other: RTC repair protocol  Weeks Six-Twelve Precautions: -no lifting -no supporting BW with hands and arms -no sudden jerking -no excessive behind the back movements -avoid upper extremity ergometer    RED FLAGS: None   WEIGHT BEARING RESTRICTIONS: No  FALLS:  Has patient fallen in last 6 months? No  LIVING ENVIRONMENT: Lives with: lives with their spouse Lives in: House/apartment  OCCUPATION: Cardiovascular Specialist  PLOF: Independent  PATIENT GOALS:Return to occupation, return to PLOF.  NEXT MD VISIT: N/A  OBJECTIVE:  Note: Objective measures were completed at Evaluation unless otherwise noted.  DIAGNOSTIC FINDINGS:  N/A  PATIENT SURVEYS:  FOTO 47/68  COGNITION: Overall cognitive status: Within functional limits for tasks assessed     SENSATION: WFL  POSTURE: Mildly rounded shoulders  CERVICAL AROM: Flexion: 70 Extension: 55 Rotation R/L: 60/70 Lateral flexion R/L: 45/45  UPPER EXTREMITY ROM:   Active ROM unless otherwise noted Right Eval PROM 11/26 Left eval  Shoulder  flexion 81 165  Shoulder extension  65  Shoulder abduction  149  Shoulder adduction    Shoulder internal rotation 55 90  Shoulder external rotation 20 65  Elbow flexion WNL WNL  Elbow extension 0 WNL  Wrist flexion WNL WNL  Wrist extension WNL WNL  Wrist ulnar deviation    Wrist radial deviation    Wrist pronation WNL WNL  Wrist supination WNL WNL  (Blank rows = not tested)  UPPER EXTREMITY MMT:  MMT Right Eval Left Eval  Shoulder flexion deferred 5  Shoulder extension deferred   Shoulder abduction deferred 5  Shoulder adduction    Shoulder internal rotation deferred 5  Shoulder external rotation deferred 5  Middle trapezius    Lower trapezius    Elbow flexion  5  Elbow extension  4 (seated)   TODAY'S TREATMENT:                                                                                                                                         DATE: 09/02/23  There.Act:  Seated shoulder pulleys flexion/abduction AAROM for scapulohumeral rhythm, improve end ranges of motion for RUE. x20 in flexion, x12 in abduction.   Standing 4# RUE scaption: 3x8  Standing R shoulder abduction short lever arm: 4# DB, 2x8. VC's to minimize R upper trap shrug.   Standing red physioball walk outs overhead for RTC endurance. 0J81' hallway.   Standing B shoulder extension, blue TB: 3x12  Standing R shoulder rhythmic stabilization overhead, 30 sec CW, 30 sec CCW.    Manual therapy in supine: 5 minutes not billed   End range R shoulder flexion GHJ inferior mobs grade 3, 2x12 bouts, 3 seconds. Grade 3, 2x12 bouts AP mobs. TO improve end range flexion and abduction AROM.      PATIENT EDUCATION: Education details: Updated precautions/restrictions/HEP per phase 2 protocol Person educated: Patient Education method: Explanation Education comprehension: verbalized understanding, returned demonstration, verbal cues required, and tactile cues required  HOME EXERCISE PROGRAM: Access  Code: [email or web address] URL: [email or web address] Date: 08/31/2023 Prepared by: Ronnie Derby  Exercises - Seated Shoulder Abduction with Bent Elbow  - 1 x daily - 3-4 x weekly - 2 sets - 12 reps - Prone Shoulder Row  - 3 x daily - 1 sets - 10 reps - Supine Shoulder Flexion Extension AAROM with Dowel  - 1 x daily - 4-5 x weekly - 3 sets - 12 reps - Prone Shoulder Row  - 1 x daily - 3 x weekly - 2 sets - 12 reps - Prone Shoulder Extension - Single Arm  - 1 x daily - 3 x weekly - 2 sets - 12 reps - Shoulder Internal Rotation with Resistance  - 1 x daily - 2-3 x weekly - 3 sets - 12 reps - Standing Row with Resistance with Anchored Resistance at Chest Height Palms Down  - 1 x daily - 2-3 x weekly - 3 sets - 8 reps - Single Arm Scaption with Resistance  - 1 x daily - 3-4 x weekly - 3 sets - 8 reps - Standing Shoulder Horizontal Abduction with Resistance  - 1 x daily - 3-4 x  weekly - 3 sets - 8 reps - Shoulder External Rotation with Anchored Resistance  - 1 x daily - 3-4 x weekly - 3 sets - 8 reps   Access Code: [email or web address] URL: [email or web address] Date: 08/17/2023 Prepared by: Ronnie Derby  Exercises - Seated Single Arm Shoulder Scaption  - 1 x daily - 3-4 x weekly - 2 sets - 12 reps - Seated Shoulder External Rotation  - 1 x daily - 3-4 x weekly - 2 sets - 12 reps - Seated Shoulder Abduction with Bent Elbow  - 1 x daily - 3-4 x weekly - 2 sets - 12 reps - Prone Shoulder Row  - 3 x daily - 1 sets - 10 reps - Supine Shoulder Flexion Extension AAROM with Dowel  - 1 x daily - 4-5 x weekly - 3 sets - 12 reps - Sidelying Shoulder External Rotation  - 1 x daily - 3 x weekly - 2 sets - 12 reps - Prone Shoulder Row  - 1 x daily - 3 x weekly - 2 sets - 12 reps - Prone Shoulder Extension - Single Arm  - 1 x daily - 3 x weekly - 2 sets - 12 reps - Shoulder Internal Rotation with Resistance  - 1 x daily - 2-3 x weekly - 3 sets - 12 reps - Shoulder External Rotation and Scapular  Retraction with Resistance  - 1 x daily - 2-3 x weekly - 3 sets - 12 reps - Standing Row with Resistance with Anchored Resistance at Chest Height Palms Down  - 1 x daily - 2-3 x weekly - 3 sets - 8 reps   Access Code: [email or web address] URL: [email or web address] Date: 08/11/2023 Prepared by: Ronnie Derby  Exercises - Seated Single Arm Shoulder Scaption  - 1 x daily - 3-4 x weekly - 2 sets - 12 reps - Seated Shoulder External Rotation  - 1 x daily - 3-4 x weekly - 2 sets - 12 reps - Seated Shoulder Abduction with Bent Elbow  - 1 x daily - 3-4 x weekly - 2 sets - 12 reps - Prone Shoulder Row  - 3 x daily - 1 sets - 10 reps - Supine Shoulder Flexion Extension AAROM with Dowel  - 1 x daily - 4-5 x weekly - 3 sets - 12 reps - Sidelying Shoulder External Rotation  - 1 x daily - 3 x weekly - 2 sets - 12 reps - Prone Shoulder Row  - 1 x daily - 3 x weekly - 2 sets - 12 reps - Prone Shoulder Extension - Single Arm  - 1 x daily - 3 x weekly - 2 sets - 12 reps  Access Code: [email or web address] URL: [email or web address] Date: 08/05/2023 Prepared by: Ronnie Derby  Exercises - Prone Shoulder Row  - 3 x daily - 1 sets - 10 reps - Isometric Shoulder Adduction  - 3 x daily - 1 sets - 10 reps - 3 sec hold - Isometric Shoulder Abduction at Wall  - 3 x daily - 1 sets - 10 reps - 3 sec hold - Standing Isometric Shoulder External Rotation with Doorway  - 3 x daily - 1 sets - 10 reps - 3 sec hold - Standing Isometric Shoulder Flexion with Doorway - Arm Bent  - 3 x daily - 1 sets - 10 reps - 3 sec hold - Standing Isometric Shoulder Internal Rotation at Doorway  - 3 x daily - 1 sets - 10 reps - 3 sec hold - Supine Shoulder  Flexion Extension AAROM with Dowel  - 1 x daily - 4-5 x weekly - 3 sets - 12 reps - Sidelying Shoulder External Rotation  - 1 x daily - 3 x weekly - 2 sets - 12 reps - Prone Shoulder Row  - 1 x daily - 3 x weekly - 2 sets - 12 reps - Prone Shoulder Extension - Single Arm  - 1 x  daily - 3 x weekly - 2 sets - 12 reps - Prone Shoulder Horizontal Abduction  - 1 x daily - 3 x weekly - 2 sets - 12 reps - Supine Shoulder Flexion Extension Full Range AROM  - 1 x daily - 7 x weekly - 2 sets - 15 reps - Supine Shoulder Abduction AROM  - 1 x daily - 7 x weekly - 2 sets - 15 reps   Access Code: [email or web address] URL: [email or web address] Date: 07/09/2023 Prepared by: Alvera Novel  Exercises - Seated Scapular Retraction  - 3 x daily - 1 sets - 15 reps - Seated Shoulder Shrugs  - 3 x daily - 1 sets - 15 reps - Seated Shoulder Flexion Towel Slide at Table Top  - 3 x daily - 1 sets - 15-20 reps - Seated Shoulder Abduction Towel Slide at Table Top  - 1 x daily - 1 sets - 15-20 reps - Prone Shoulder Row  - 3 x daily - 1 sets - 10 reps - Isometric Shoulder Adduction  - 3 x daily - 1 sets - 10 reps - 3 sec hold - Isometric Shoulder Abduction at Wall  - 3 x daily - 1 sets - 10 reps - 3 sec hold - Standing Isometric Shoulder External Rotation with Doorway  - 3 x daily - 1 sets - 10 reps - 3 sec hold - Standing Isometric Shoulder Flexion with Doorway - Arm Bent  - 3 x daily - 1 sets - 10 reps - 3 sec hold - Standing Isometric Shoulder Internal Rotation at Doorway  - 3 x daily - 1 sets - 10 reps - 3 sec hold  ASSESSMENT:  CLINICAL IMPRESSION: Pt is s/p 13 weeks RTC to supraspinatus tendon. Pt at end of current POC thus goals assessed. Pt has made improvements in FOTO score and improved R shoulder AROM to 140 degrees flexion and 115 degrees abduction indicating improved functional mobility. Pt remains unable to work due to healing timelines but is progressing well with strength training. Pt continues to demonstrate limitations in RUE strength and AROM limitations limiting full return to ADL's and work tasks. PT to request additional 8 weeks of PT 2x/week to maximize functional capacity and return to PLOF.   OBJECTIVE IMPAIRMENTS: decreased mobility, decreased ROM, decreased  strength, impaired UE functional use, postural dysfunction, and pain.   ACTIVITY LIMITATIONS: carrying, lifting, bathing, toileting, dressing, reach over head, and hygiene/grooming  PARTICIPATION LIMITATIONS: cleaning, community activity, occupation, and yard work  PERSONAL FACTORS: Age, Education, Fitness, Past/current experiences, Profession, and Time since onset of injury/illness/exacerbation are also affecting patient's functional outcome.   REHAB POTENTIAL: Good  CLINICAL DECISION MAKING: Stable/uncomplicated  EVALUATION COMPLEXITY: Low   GOALS: Goals reviewed with patient? No  SHORT TERM GOALS: Target date: 07/06/23 unless otherwise posted below  Pt will be independent with HEP to improve R shoulder AROM and strength within protocol parameters to improve functional mobility.  Baseline: 06/08/23: Provided initial HEP; 07/02/23: Independent with HEP progressing PROM.   Goal status: MET  2.  Pt will demonstrate R shoulder AROM in  flexion to at least 120 degrees in supine for gradual return to overhead ADL completion. Baseline: 06/08/23: Phase 1 of current protocol (PROM); 08/05/23: 125 degrees Goal status: MET Target Date: 07/21/22 (8 weeks post op)  LONG TERM GOALS: Target date: 10/26/23 unless otherwise posted below  Pt will improve FOTO to target score to demonstrate clinically significant improvement in functional mobility. Baseline: 06/08/23: 47/68; 08/17/23: 78/29; 08/31/23: 64/68 Goal status: ON GOING  2.  Pt will demonstrate full, R shoulder AROM in all planes equal to LUE to demonstrate return to full capacity to complete ADL's.  Baseline: 06/08/23  Active ROM unless otherwise noted Right Eval PROM 11/26 Left eval Right  AROM Seated 08/17/23 Right  AROM Seated 08/31/23  Shoulder flexion 81 165 125 140  Shoulder extension  65    Shoulder abduction  149 91 115  Shoulder adduction      Shoulder internal rotation 55 90    Shoulder external rotation 20 65 60 60   Elbow flexion WNL WNL    Elbow extension 0 WNL    Wrist flexion WNL WNL    Wrist extension WNL WNL    Wrist ulnar deviation      Wrist radial deviation      Wrist pronation WNL WNL    Wrist supination WNL WNL    (Blank rows = not tested) Goal status: INITIAL  3.  Pt will be independent with updated HEP program including periscapular and RTC strength/stability related exercises to begin return to ability to complete work related and recreational related activity.   Baseline: 06/08/23: ; 08/17/23: independent with progressive protocol Goal status: MET  4.  Pt will report return to full work related tasks without strength or other reported functional deficits to demonstrate return to PLOF.  Baseline:  Goal status: INITIAL Target date: 11/28/22 (16 weeks post-op) PLAN:  PT FREQUENCY: 1-2x/week  PT DURATION: 8 weeks  PLANNED INTERVENTIONS: 97164- PT Re-evaluation, 97110-Therapeutic exercises, 97530- Therapeutic activity, 97112- Neuromuscular re-education, 97535- Self Care, 56213- Manual therapy, 97014- Electrical stimulation (unattended), [phone]- Electrical stimulation (manual), Patient/Family education, Dry Needling, Joint mobilization, Spinal manipulation, Spinal mobilization, Cryotherapy, Moist heat, and Biofeedback  PLAN FOR NEXT SESSION: strength training to protocol standards. Extension and behind back AROM. End range flexion and abduction.  Delphia Grates. Fairly IV, PT, DPT Physical Therapist-   [facility]  4:32 PM, 09/02/23

## 2023-09-07 ENCOUNTER — Ambulatory Visit: Payer: PRIVATE HEALTH INSURANCE

## 2023-09-07 DIAGNOSIS — M6281 Muscle weakness (generalized): Secondary | ICD-10-CM

## 2023-09-07 DIAGNOSIS — M25511 Pain in right shoulder: Secondary | ICD-10-CM | POA: Diagnosis not present

## 2023-09-07 NOTE — Progress Notes (Unsigned)
 Cardiology Clinic Note   Date: 09/10/2023 ID: Yamin, Swingler May 05, 1961, MRN [id number]  Primary Cardiologist:  Lorine Bears, MD  Chief Complaint   Austin Houston is a 63 y.o. male who presents to the clinic today for routine follow up.   Patient Profile   Austin Houston is followed by Dr. Kirke Corin  for the history outlined below.      Past medical history significant for: Nonobstructive CAD.  LHC 07/20/2022 (Pre-op aortic aneurysm repair): Mild diffuse disease throughout LAD. Nonobstructive stenosis ostial D1. Minimal luminal irregularities RCA.  Ascending aortic aneurysm. Echo 06/16/2022: EF 70 to 75%.  Indeterminate diastolic parameters.  Hyperdynamic RV function, normal RV size.  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 Aortic aneurysm repair 09/07/2022. PAF. Onset postop aortic aneurysm repair March 2024.  Hypertension.  Hyperlipidemia.  Lipid panel 02/17/2023: LDL 138, HDL 110, TG 73, total 263. GERD.  Asthma.  Thyroid cancer.  Prostate cancer.   In summary, patient was previously seen by Dr. Mariah Milling in the past for hypertension. He has a history of aortic aneurysm with echo in December 2023 demonstrating dilatation of ascending aorta and aortic arch as detailed above. He underwent LHC in January 2024 as part of preoperative workup prior to aortic aneurysm repair performed in February 2024. He was found to have nonobstructive CAD as detailed above. He had aortic aneurysm repair in February 2024. He was evaluated by Dr. Kirke Corin in the office on 09/17/2022 for postop afib. He reported waking that morning with chest pain and palpitations with HR in the 130s. He took an extra metoprolol and heart rate improved. He was not started on anticoagulation at that time. Metoprolol was increased.   Patient was last seen in the office by Dr. Kirke Corin on 10/27/2022 for follow up. He reported 1 additional episode of afib since office visit in March  lasting only a few minutes.      History of Present Illness    Today, patient is doing well. He just retired from the [employer] at [employer]. Patient denies shortness of breath, dyspnea on exertion, lower extremity edema, orthopnea or PND. No chest pain, pressure, or tightness. No palpitations. He reports about 6 months ago he had some runs of PVCs but none since. No further afib detected. He is very active walking 1-2 times a day.     ROS: All other systems reviewed and are otherwise negative except as noted in History of Present Illness.  EKGs/Labs Reviewed    EKG Interpretation Date/Time:  Friday September 10 2023 08:07:55 EST Ventricular Rate:  72 PR Interval:  170 QRS Duration:  84 QT Interval:  390 QTC Calculation: 427 R Axis:   -25  Text Interpretation: Normal sinus rhythm Possible Left atrial enlargement ST & T Wave abnormality When compared with ECG of 15-Feb-2023 09:27, Vent. rate has decreased BY  49 BPM Criteria for Inferior-posterior infarct are no longer Present Confirmed by Carlos Levering [phone]) on 09/10/2023 8:17:13 AM   05/27/2023: ALT 20; AST 22; BUN 12; Creatinine, Ser 0.90; Potassium 3.8; Sodium 135   05/27/2023: Hemoglobin 13.5; WBC 4.6   02/17/2023: TSH 6.941    Risk Assessment/Calculations     CHA2DS2-VASc Score = 1   This indicates a 0.6% annual risk of stroke. The patient's score is based upon: CHF History: 0 HTN History: 1 Diabetes History: 0 Stroke History: 0 Vascular Disease History: 0 Age Score: 0 Gender Score: 0  Physical Exam    VS:  BP 106/64   Pulse 72   Ht 6\' 1"  (1.854 m)   Wt 203 lb 6.4 oz (92.3 kg)   SpO2 98%   BMI 26.84 kg/m  , BMI Body mass index is 26.84 kg/m.  GEN: Well nourished, well developed, in no acute distress. Neck: No JVD or carotid bruits. Cardiac:  RRR. No murmurs. No rubs or gallops.   Respiratory:  Respirations regular and unlabored. Clear to auscultation with upper rhonchi that clears with cough  and mild expiratory wheeze throughout.  GI: Soft, nontender, nondistended. Extremities: Radials/DP/PT 2+ and equal bilaterally. No clubbing or cyanosis. No edema.  Skin: Warm and dry, no rash. Neuro: Strength intact.  Assessment & Plan   Nonobstructive CAD LHC January 2024 showed mild diffuse disease throughout LAD, nonobstructive stenosis ostial D1, minimal luminal irregularities RCA. Patient denies chest pain, pressure or tightness. He is active walking for exercise.  -Continue aspirin, metoprolol.   Ascending aortic aneurysm S/p repair February 2024. Patient denies chest pain, back pain, abdominal pain, lightheadedness, dizziness, presyncope or syncope.  -CTA aorta for yearly surveillance of graft.   PAF Onset postop aortic aneurysm repair March 2024. Patient denies palpitations or further runs of afib. He had some PVCs about 6 months ago but none since. CHA2DS2-VASc 1.  -Continue metoprolol.  Hypertension BP today 106/64. No reported headaches or dizziness.  -Continue metoprolol, losartan.   Hyperlipidemia LDL August 2024 138, HDL 110. Atorvastatin was stopped by PCP given high HDL.   -Continue to follow with PCP.   Disposition: CBC and BMP today. CTA aorta. Return in 1 year or sooner as needed.          Signed, Etta Grandchild. Nabria Nevin, DNP, NP-C

## 2023-09-07 NOTE — Therapy (Signed)
 OUTPATIENT PHYSICAL THERAPY SHOULDER TREATMENT  Patient Name: Austin Houston MRN: [id number] DOB:[birth year], 63 y.o., male Today's Date: 09/07/2023  END OF SESSION:  PT End of Session - 09/07/23 1425     Visit Number 13    Number of Visits 25    Date for PT Re-Evaluation 10/26/23    Authorization Type [facility]    Progress Note Due on Visit 10    PT Start Time 1428    PT Stop Time 1510    PT Time Calculation (min) 42 min    Activity Tolerance Patient tolerated treatment well    Behavior During Therapy WFL for tasks assessed/performed             Past Medical History:  Diagnosis Date   Actinic keratosis    Allergic rhinitis due to pollen    Asthma    vs COPD- taking inhalers- as related to allergies only.   Basal cell carcinoma 03/26/2020   R lat deltoid - ED&C    Dysplastic nevus 03/07/2020   L costal infrapectoral - moderate   Dysplastic nevus 03/07/2020   R post flank above waistline - moderate   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HCC)    s/p partial thyroidectomy (follicular adenoma)-surgery only   GERD (gastroesophageal reflux disease)    Hearing impaired person, bilateral    hearing aida bilateral"high frequency loss   Hyperlipidemia    Hypertension    Prostate cancer (HCC) 01/08/2016   PTSD (post-traumatic stress disorder)    s/p multiple active deployments for [employer], no medications taken currently   Past Surgical History:  Procedure Laterality Date   COLONOSCOPY W/ POLYPECTOMY     age 63 "adenoma"   INGUINAL HERNIA REPAIR     right   LEFT HEART CATH AND CORONARY ANGIOGRAPHY N/A 07/20/2022   Procedure: LEFT HEART CATH AND CORONARY ANGIOGRAPHY;  Surgeon: Tonny Bollman, MD;  Location: [facility] INVASIVE CV LAB;  Service: Cardiovascular;  Laterality: N/A;   REPLACEMENT ASCENDING AORTA N/A 09/07/2022   Procedure: REPLACEMENT ASCENDING AORTA;  Surgeon: Alleen Borne, MD;  Location: [facility] OR;  Service: Open Heart Surgery;  Laterality: N/A;  WITH  CIRC ARREST   ROBOT ASSISTED LAPAROSCOPIC RADICAL PROSTATECTOMY N/A 07/23/2016   Procedure: XI ROBOTIC ASSISTED LAPAROSCOPIC RADICAL PROSTATECTOMY LEVEL 1;  Surgeon: Heloise Purpura, MD;  Location: [facility] ORS;  Service: Urology;  Laterality: N/A;   SHOULDER ARTHROSCOPY WITH ROTATOR CUFF REPAIR AND SUBACROMIAL DECOMPRESSION Right 05/27/2023   Procedure: SHOULDER ARTHROSCOPY WITH ROTATOR CUFF REPAIR AND SUBACROMIAL DECOMPRESSION;  Surgeon: Jones Broom, MD;  Location: [facility] ORS;  Service: Orthopedics;  Laterality: Right;  NEEDS 90 MINUTES   TEE WITHOUT CARDIOVERSION N/A 09/07/2022   Procedure: TRANSESOPHAGEAL ECHOCARDIOGRAM (TEE);  Surgeon: Alleen Borne, MD;  Location: [facility] OR;  Service: Open Heart Surgery;  Laterality: N/A;   THYROIDECTOMY, PARTIAL     [facility])   Patient Active Problem List   Diagnosis Date Noted   Anxiety and depression 02/21/2023   Pancytopenia (HCC) 02/21/2023   Elevated liver enzymes 02/21/2023   Alcohol use disorder, severe 02/16/2023   S/P ascending aortic aneurysm repair 09/07/2022   Aneurysm of ascending aorta without rupture (HCC) 07/20/2022   Mild persistent asthma without complication 01/24/2020   Prostate cancer (HCC) 01/08/2016   Varicose veins of bilateral lower extremities with other complications 03/01/2013   Former smokeless tobacco use 11/25/2012   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HCC)    GERD (gastroesophageal reflux disease)    Allergic rhinitis due  to pollen    Hypertension    Hyperlipidemia    PTSD (post-traumatic stress disorder)     PCP: Hannah Beat, MD  REFERRING PROVIDER: Berline Lopes  REFERRING DIAG: RIGHT SHOULDER ROTATOR CUFF TEAR   THERAPY DIAG:  Acute pain of right shoulder  Muscle weakness (generalized)  Rationale for Evaluation and Treatment: Rehabilitation  ONSET DATE: 05/27/23  SUBJECTIVE:                                                                                                                                                                                       SUBJECTIVE STATEMENT: Reports his shoulder AROM is improving.   Hand dominance: Right  PERTINENT HISTORY: Pt is a pleasant 63 y.o. male referred to OPPT s/p R RTC tear repair and subacromial decompression 05/27/23. Pt reports having a fall at work in the [employer] onto his R shoulder. Happened December 5th, 2023. Waited for his RTC surgery as he had open heart surgery for ascending aortic aneurysm in February 2024. Pain is minimal, controlled via Tylenol. R handed. Pt works in [employer] lab at [employer] requiring BUE use such as mobilizing patients, transfers, and use of equipment for performing catheterizations.   PAIN:  Are you having pain? None on arrival; noted soreness during end-range stretches and soreness at end of session.   PRECAUTIONS: Other: RTC repair protocol  Weeks Six-Twelve Precautions: -no lifting -no supporting BW with hands and arms -no sudden jerking -no excessive behind the back movements -avoid upper extremity ergometer    RED FLAGS: None   WEIGHT BEARING RESTRICTIONS: No  FALLS:  Has patient fallen in last 6 months? No  LIVING ENVIRONMENT: Lives with: lives with their spouse Lives in: House/apartment  OCCUPATION: Cardiovascular Specialist  PLOF: Independent  PATIENT GOALS:Return to occupation, return to PLOF.  NEXT MD VISIT: N/A  OBJECTIVE:  Note: Objective measures were completed at Evaluation unless otherwise noted.  DIAGNOSTIC FINDINGS:  N/A  PATIENT SURVEYS:  FOTO 47/68  COGNITION: Overall cognitive status: Within functional limits for tasks assessed     SENSATION: WFL  POSTURE: Mildly rounded shoulders  CERVICAL AROM: Flexion: 70 Extension: 55 Rotation R/L: 60/70 Lateral flexion R/L: 45/45  UPPER EXTREMITY ROM:   Active ROM unless otherwise noted Right Eval PROM 11/26 Left eval  Shoulder flexion 81 165  Shoulder extension  65  Shoulder abduction  149   Shoulder adduction    Shoulder internal rotation 55 90  Shoulder external rotation 20 65  Elbow flexion WNL WNL  Elbow extension 0 WNL  Wrist flexion WNL WNL  Wrist extension WNL WNL  Wrist ulnar deviation    Wrist  radial deviation    Wrist pronation WNL WNL  Wrist supination WNL WNL  (Blank rows = not tested)  UPPER EXTREMITY MMT:  MMT Right Eval Left Eval  Shoulder flexion deferred 5  Shoulder extension deferred   Shoulder abduction deferred 5  Shoulder adduction    Shoulder internal rotation deferred 5  Shoulder external rotation deferred 5  Middle trapezius    Lower trapezius    Elbow flexion  5  Elbow extension  4 (seated)   TODAY'S TREATMENT:                                                                                                                                         DATE: 09/07/23   There.ex:   Shoulder AROM prior to session R shoulder flexion: 150 degrees R shoulder abduction: 126 degrees  RUE tricep extension with blue TB, 3x12  Wall push up, neutral BUE positioning: 3x12, VC's for neutral shoulder positioning   Isometric planks on hands for RTC stability: 3x25 sec    There.Act:   Standing 5# RUE scaption: 3x8  Standing R shoulder abduction short lever arm: 5# DB, 3x6  Standing R shoulder rhythmic stabilization overhead in flexion end range, 2x30 sec CW, 2x30 sec CCW with 2 KG med ball.   90 degrees abduction 2x30 sec CW, 2x30 CCW   Standing RUE wall angels, 3x12    PATIENT EDUCATION: Education details: Updated precautions/restrictions/HEP per phase 2 protocol Person educated: Patient Education method: Explanation Education comprehension: verbalized understanding, returned demonstration, verbal cues required, and tactile cues required  HOME EXERCISE PROGRAM: Access Code: [email or web address] URL: [email or web address] Date: 08/31/2023 Prepared by: Ronnie Derby  Exercises - Seated Shoulder Abduction with Bent Elbow  - 1 x  daily - 3-4 x weekly - 2 sets - 12 reps - Prone Shoulder Row  - 3 x daily - 1 sets - 10 reps - Supine Shoulder Flexion Extension AAROM with Dowel  - 1 x daily - 4-5 x weekly - 3 sets - 12 reps - Prone Shoulder Row  - 1 x daily - 3 x weekly - 2 sets - 12 reps - Prone Shoulder Extension - Single Arm  - 1 x daily - 3 x weekly - 2 sets - 12 reps - Shoulder Internal Rotation with Resistance  - 1 x daily - 2-3 x weekly - 3 sets - 12 reps - Standing Row with Resistance with Anchored Resistance at Chest Height Palms Down  - 1 x daily - 2-3 x weekly - 3 sets - 8 reps - Single Arm Scaption with Resistance  - 1 x daily - 3-4 x weekly - 3 sets - 8 reps - Standing Shoulder Horizontal Abduction with Resistance  - 1 x daily - 3-4 x weekly - 3 sets - 8 reps - Shoulder External Rotation with Anchored Resistance  - 1 x daily - 3-4 x weekly - 3 sets -  8 reps   Access Code: [email or web address] URL: [email or web address] Date: 08/17/2023 Prepared by: Ronnie Derby  Exercises - Seated Single Arm Shoulder Scaption  - 1 x daily - 3-4 x weekly - 2 sets - 12 reps - Seated Shoulder External Rotation  - 1 x daily - 3-4 x weekly - 2 sets - 12 reps - Seated Shoulder Abduction with Bent Elbow  - 1 x daily - 3-4 x weekly - 2 sets - 12 reps - Prone Shoulder Row  - 3 x daily - 1 sets - 10 reps - Supine Shoulder Flexion Extension AAROM with Dowel  - 1 x daily - 4-5 x weekly - 3 sets - 12 reps - Sidelying Shoulder External Rotation  - 1 x daily - 3 x weekly - 2 sets - 12 reps - Prone Shoulder Row  - 1 x daily - 3 x weekly - 2 sets - 12 reps - Prone Shoulder Extension - Single Arm  - 1 x daily - 3 x weekly - 2 sets - 12 reps - Shoulder Internal Rotation with Resistance  - 1 x daily - 2-3 x weekly - 3 sets - 12 reps - Shoulder External Rotation and Scapular Retraction with Resistance  - 1 x daily - 2-3 x weekly - 3 sets - 12 reps - Standing Row with Resistance with Anchored Resistance at Chest Height Palms Down  - 1 x  daily - 2-3 x weekly - 3 sets - 8 reps   Access Code: [email or web address] URL: [email or web address] Date: 08/11/2023 Prepared by: Ronnie Derby  Exercises - Seated Single Arm Shoulder Scaption  - 1 x daily - 3-4 x weekly - 2 sets - 12 reps - Seated Shoulder External Rotation  - 1 x daily - 3-4 x weekly - 2 sets - 12 reps - Seated Shoulder Abduction with Bent Elbow  - 1 x daily - 3-4 x weekly - 2 sets - 12 reps - Prone Shoulder Row  - 3 x daily - 1 sets - 10 reps - Supine Shoulder Flexion Extension AAROM with Dowel  - 1 x daily - 4-5 x weekly - 3 sets - 12 reps - Sidelying Shoulder External Rotation  - 1 x daily - 3 x weekly - 2 sets - 12 reps - Prone Shoulder Row  - 1 x daily - 3 x weekly - 2 sets - 12 reps - Prone Shoulder Extension - Single Arm  - 1 x daily - 3 x weekly - 2 sets - 12 reps  Access Code: [email or web address] URL: [email or web address] Date: 08/05/2023 Prepared by: Ronnie Derby  Exercises - Prone Shoulder Row  - 3 x daily - 1 sets - 10 reps - Isometric Shoulder Adduction  - 3 x daily - 1 sets - 10 reps - 3 sec hold - Isometric Shoulder Abduction at Wall  - 3 x daily - 1 sets - 10 reps - 3 sec hold - Standing Isometric Shoulder External Rotation with Doorway  - 3 x daily - 1 sets - 10 reps - 3 sec hold - Standing Isometric Shoulder Flexion with Doorway - Arm Bent  - 3 x daily - 1 sets - 10 reps - 3 sec hold - Standing Isometric Shoulder Internal Rotation at Doorway  - 3 x daily - 1 sets - 10 reps - 3 sec hold - Supine Shoulder Flexion Extension AAROM with Dowel  - 1 x daily - 4-5 x weekly - 3 sets - 12 reps - Sidelying Shoulder External Rotation  -  1 x daily - 3 x weekly - 2 sets - 12 reps - Prone Shoulder Row  - 1 x daily - 3 x weekly - 2 sets - 12 reps - Prone Shoulder Extension - Single Arm  - 1 x daily - 3 x weekly - 2 sets - 12 reps - Prone Shoulder Horizontal Abduction  - 1 x daily - 3 x weekly - 2 sets - 12 reps - Supine Shoulder Flexion Extension Full  Range AROM  - 1 x daily - 7 x weekly - 2 sets - 15 reps - Supine Shoulder Abduction AROM  - 1 x daily - 7 x weekly - 2 sets - 15 reps   Access Code: [email or web address] URL: [email or web address] Date: 07/09/2023 Prepared by: Alvera Novel  Exercises - Seated Scapular Retraction  - 3 x daily - 1 sets - 15 reps - Seated Shoulder Shrugs  - 3 x daily - 1 sets - 15 reps - Seated Shoulder Flexion Towel Slide at Table Top  - 3 x daily - 1 sets - 15-20 reps - Seated Shoulder Abduction Towel Slide at Table Top  - 1 x daily - 1 sets - 15-20 reps - Prone Shoulder Row  - 3 x daily - 1 sets - 10 reps - Isometric Shoulder Adduction  - 3 x daily - 1 sets - 10 reps - 3 sec hold - Isometric Shoulder Abduction at Wall  - 3 x daily - 1 sets - 10 reps - 3 sec hold - Standing Isometric Shoulder External Rotation with Doorway  - 3 x daily - 1 sets - 10 reps - 3 sec hold - Standing Isometric Shoulder Flexion with Doorway - Arm Bent  - 3 x daily - 1 sets - 10 reps - 3 sec hold - Standing Isometric Shoulder Internal Rotation at Doorway  - 3 x daily - 1 sets - 10 reps - 3 sec hold  ASSESSMENT:  CLINICAL IMPRESSION: Continuing PT POC with focus on end range flexion and abduction ROM and progressive, gentle shoulder strengthening. Pt improving by 10+ degrees in flexion and abduction in RUE since last measurement. Pt tolerating CKC RTC stability exercises and rhythmic stabilizations with RUE fatigue but no pain. Pt still lacking in R shoulder abduction mobility needed for job related tasks. Pt approaching week 16 of protocol where pt will be able to really progress strengthening with no weight lifting limitations. Pt will continue to benefit from skilled Pt services to progress RUE strength and AROM to return to PLOF.    OBJECTIVE IMPAIRMENTS: decreased mobility, decreased ROM, decreased strength, impaired UE functional use, postural dysfunction, and pain.   ACTIVITY LIMITATIONS: carrying, lifting, bathing,  toileting, dressing, reach over head, and hygiene/grooming  PARTICIPATION LIMITATIONS: cleaning, community activity, occupation, and yard work  PERSONAL FACTORS: Age, Education, Fitness, Past/current experiences, Profession, and Time since onset of injury/illness/exacerbation are also affecting patient's functional outcome.   REHAB POTENTIAL: Good  CLINICAL DECISION MAKING: Stable/uncomplicated  EVALUATION COMPLEXITY: Low   GOALS: Goals reviewed with patient? No  SHORT TERM GOALS: Target date: 07/06/23 unless otherwise posted below  Pt will be independent with HEP to improve R shoulder AROM and strength within protocol parameters to improve functional mobility.  Baseline: 06/08/23: Provided initial HEP; 07/02/23: Independent with HEP progressing PROM.   Goal status: MET  2.  Pt will demonstrate R shoulder AROM in flexion to at least 120 degrees in supine for gradual return to overhead ADL completion. Baseline: 06/08/23: Phase 1 of current protocol (  PROM); 08/05/23: 125 degrees Goal status: MET Target Date: 07/21/22 (8 weeks post op)  LONG TERM GOALS: Target date: 10/26/23 unless otherwise posted below  Pt will improve FOTO to target score to demonstrate clinically significant improvement in functional mobility. Baseline: 06/08/23: 47/68; 08/17/23: 16/10; 08/31/23: 64/68 Goal status: ON GOING  2.  Pt will demonstrate full, R shoulder AROM in all planes equal to LUE to demonstrate return to full capacity to complete ADL's.  Baseline: 06/08/23  Active ROM unless otherwise noted Right Eval PROM 11/26 Left eval Right  AROM Seated 08/17/23 Right  AROM Seated 08/31/23  Shoulder flexion 81 165 125 140  Shoulder extension  65    Shoulder abduction  149 91 115  Shoulder adduction      Shoulder internal rotation 55 90    Shoulder external rotation 20 65 60 60  Elbow flexion WNL WNL    Elbow extension 0 WNL    Wrist flexion WNL WNL    Wrist extension WNL WNL    Wrist ulnar deviation       Wrist radial deviation      Wrist pronation WNL WNL    Wrist supination WNL WNL    (Blank rows = not tested) Goal status: INITIAL  3.  Pt will be independent with updated HEP program including periscapular and RTC strength/stability related exercises to begin return to ability to complete work related and recreational related activity.   Baseline: 06/08/23: ; 08/17/23: independent with progressive protocol Goal status: MET  4.  Pt will report return to full work related tasks without strength or other reported functional deficits to demonstrate return to PLOF.  Baseline:  Goal status: INITIAL Target date: 11/28/22 (16 weeks post-op) PLAN:  PT FREQUENCY: 1-2x/week  PT DURATION: 8 weeks  PLANNED INTERVENTIONS: 97164- PT Re-evaluation, 97110-Therapeutic exercises, 97530- Therapeutic activity, 97112- Neuromuscular re-education, 97535- Self Care, 96045- Manual therapy, 97014- Electrical stimulation (unattended), [phone]- Electrical stimulation (manual), Patient/Family education, Dry Needling, Joint mobilization, Spinal manipulation, Spinal mobilization, Cryotherapy, Moist heat, and Biofeedback  PLAN FOR NEXT SESSION: strength training to protocol standards. Extension and behind back AROM. End range flexion and abduction with scapulohumeral rhythm.   Delphia Grates. Fairly IV, PT, DPT Physical Therapist-   [facility]  3:16 PM, 09/07/23

## 2023-09-09 ENCOUNTER — Ambulatory Visit: Payer: PRIVATE HEALTH INSURANCE

## 2023-09-09 DIAGNOSIS — M25511 Pain in right shoulder: Secondary | ICD-10-CM | POA: Diagnosis not present

## 2023-09-09 DIAGNOSIS — M6281 Muscle weakness (generalized): Secondary | ICD-10-CM

## 2023-09-09 NOTE — Therapy (Signed)
 OUTPATIENT PHYSICAL THERAPY SHOULDER TREATMENT  Patient Name: Austin Houston MRN: [id number] DOB:Jan 17, 1961, 63 y.o., male Today's Date: 09/09/2023  END OF SESSION:  PT End of Session - 09/09/23 1434     Visit Number 14    Number of Visits 25    Date for PT Re-Evaluation 10/26/23    Authorization Type [facility]    Progress Note Due on Visit 10    PT Start Time 1433    PT Stop Time 1515    PT Time Calculation (min) 42 min    Activity Tolerance Patient tolerated treatment well    Behavior During Therapy WFL for tasks assessed/performed             Past Medical History:  Diagnosis Date   Actinic keratosis    Allergic rhinitis due to pollen    Asthma    vs COPD- taking inhalers- as related to allergies only.   Basal cell carcinoma 03/26/2020   R lat deltoid - ED&C    Dysplastic nevus 03/07/2020   L costal infrapectoral - moderate   Dysplastic nevus 03/07/2020   R post flank above waistline - moderate   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HCC)    s/p partial thyroidectomy (follicular adenoma)-surgery only   GERD (gastroesophageal reflux disease)    Hearing impaired person, bilateral    hearing aida bilateral"high frequency loss   Hyperlipidemia    Hypertension    Prostate cancer (HCC) 01/08/2016   PTSD (post-traumatic stress disorder)    s/p multiple active deployments for [employer], no medications taken currently   Past Surgical History:  Procedure Laterality Date   COLONOSCOPY W/ POLYPECTOMY     age 37 "adenoma"   INGUINAL HERNIA REPAIR     right   LEFT HEART CATH AND CORONARY ANGIOGRAPHY N/A 07/20/2022   Procedure: LEFT HEART CATH AND CORONARY ANGIOGRAPHY;  Surgeon: Tonny Bollman, MD;  Location: [facility] INVASIVE CV LAB;  Service: Cardiovascular;  Laterality: N/A;   REPLACEMENT ASCENDING AORTA N/A 09/07/2022   Procedure: REPLACEMENT ASCENDING AORTA;  Surgeon: Alleen Borne, MD;  Location: [facility] OR;  Service: Open Heart Surgery;  Laterality: N/A;  WITH  CIRC ARREST   ROBOT ASSISTED LAPAROSCOPIC RADICAL PROSTATECTOMY N/A 07/23/2016   Procedure: XI ROBOTIC ASSISTED LAPAROSCOPIC RADICAL PROSTATECTOMY LEVEL 1;  Surgeon: Heloise Purpura, MD;  Location: [facility] ORS;  Service: Urology;  Laterality: N/A;   SHOULDER ARTHROSCOPY WITH ROTATOR CUFF REPAIR AND SUBACROMIAL DECOMPRESSION Right 05/27/2023   Procedure: SHOULDER ARTHROSCOPY WITH ROTATOR CUFF REPAIR AND SUBACROMIAL DECOMPRESSION;  Surgeon: Jones Broom, MD;  Location: [facility] ORS;  Service: Orthopedics;  Laterality: Right;  NEEDS 90 MINUTES   TEE WITHOUT CARDIOVERSION N/A 09/07/2022   Procedure: TRANSESOPHAGEAL ECHOCARDIOGRAM (TEE);  Surgeon: Alleen Borne, MD;  Location: [facility] OR;  Service: Open Heart Surgery;  Laterality: N/A;   THYROIDECTOMY, PARTIAL     [facility])   Patient Active Problem List   Diagnosis Date Noted   Anxiety and depression 02/21/2023   Pancytopenia (HCC) 02/21/2023   Elevated liver enzymes 02/21/2023   Alcohol use disorder, severe 02/16/2023   S/P ascending aortic aneurysm repair 09/07/2022   Aneurysm of ascending aorta without rupture (HCC) 07/20/2022   Mild persistent asthma without complication 01/24/2020   Prostate cancer (HCC) 01/08/2016   Varicose veins of bilateral lower extremities with other complications 03/01/2013   Former smokeless tobacco use 11/25/2012   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HCC)    GERD (gastroesophageal reflux disease)    Allergic rhinitis due  to pollen    Hypertension    Hyperlipidemia    PTSD (post-traumatic stress disorder)     PCP: Hannah Beat, MD  REFERRING PROVIDER: Berline Lopes  REFERRING DIAG: RIGHT SHOULDER ROTATOR CUFF TEAR   THERAPY DIAG:  Acute pain of right shoulder  Muscle weakness (generalized)  Rationale for Evaluation and Treatment: Rehabilitation  ONSET DATE: 05/27/23  SUBJECTIVE:                                                                                                                                                                                       SUBJECTIVE STATEMENT: Reports mild pain in shoulder after last session. Had to sleep in recliner. No pain today. He is back to his baseline.    Hand dominance: Right  PERTINENT HISTORY: Pt is a pleasant 63 y.o. male referred to OPPT s/p R RTC tear repair and subacromial decompression 05/27/23. Pt reports having a fall at work in the [employer] onto his R shoulder. Happened December 5th, 2023. Waited for his RTC surgery as he had open heart surgery for ascending aortic aneurysm in February 2024. Pain is minimal, controlled via Tylenol. R handed. Pt works in [employer] lab at [employer] requiring BUE use such as mobilizing patients, transfers, and use of equipment for performing catheterizations.   PAIN:  Are you having pain? None on arrival; noted soreness during end-range stretches and soreness at end of session.   PRECAUTIONS: Other: RTC repair protocol  Weeks Six-Twelve Precautions: -no lifting -no supporting BW with hands and arms -no sudden jerking -no excessive behind the back movements -avoid upper extremity ergometer    RED FLAGS: None   WEIGHT BEARING RESTRICTIONS: No  FALLS:  Has patient fallen in last 6 months? No  LIVING ENVIRONMENT: Lives with: lives with their spouse Lives in: House/apartment  OCCUPATION: Cardiovascular Specialist  PLOF: Independent  PATIENT GOALS:Return to occupation, return to PLOF.  NEXT MD VISIT: N/A  OBJECTIVE:  Note: Objective measures were completed at Evaluation unless otherwise noted.  DIAGNOSTIC FINDINGS:  N/A  PATIENT SURVEYS:  FOTO 47/68  COGNITION: Overall cognitive status: Within functional limits for tasks assessed     SENSATION: WFL  POSTURE: Mildly rounded shoulders  CERVICAL AROM: Flexion: 70 Extension: 55 Rotation R/L: 60/70 Lateral flexion R/L: 45/45  UPPER EXTREMITY ROM:   Active ROM unless otherwise noted Right Eval PROM 11/26 Left eval   Shoulder flexion 81 165  Shoulder extension  65  Shoulder abduction  149  Shoulder adduction    Shoulder internal rotation 55 90  Shoulder external rotation 20 65  Elbow flexion WNL WNL  Elbow extension 0 WNL  Wrist flexion WNL WNL  Wrist extension WNL WNL  Wrist ulnar deviation    Wrist radial deviation    Wrist pronation WNL WNL  Wrist supination WNL WNL  (Blank rows = not tested)  UPPER EXTREMITY MMT:  MMT Right Eval Left Eval  Shoulder flexion deferred 5  Shoulder extension deferred   Shoulder abduction deferred 5  Shoulder adduction    Shoulder internal rotation deferred 5  Shoulder external rotation deferred 5  Middle trapezius    Lower trapezius    Elbow flexion  5  Elbow extension  4 (seated)   TODAY'S TREATMENT:                                                                                                                                         DATE: 09/09/23   There.Act:   Hook lying, Brief 3 reps with end range flexion GHJ inferior and posterior grade 3 mobs, x12, 2 sec bouts to improve end range AROM   Hook lying with 1/2 bolster on T spine performing wall angels for thoracic extension and scapulohumeral rhythm, anterior shoulder/pec major/minor lengthening x15  Wall push up: 3x12   Standing D2 PNF flexion pattern on RUE no resistance: 3x12   Standing behind the back 3# DB passes for shoulder extension, IR, and adduction. 2x12   Standing B shoulder extension/low row: 3x12, blue TB.   Hip hinge, RUE row with blue TB: 3x8    PATIENT EDUCATION: Education details: Updated precautions/restrictions/HEP per phase 2 protocol Person educated: Patient Education method: Explanation Education comprehension: verbalized understanding, returned demonstration, verbal cues required, and tactile cues required  HOME EXERCISE PROGRAM: Access Code: [email or web address] URL: [email or web address] Date: 08/31/2023 Prepared by: Ronnie Derby  Exercises - Seated  Shoulder Abduction with Bent Elbow  - 1 x daily - 3-4 x weekly - 2 sets - 12 reps - Prone Shoulder Row  - 3 x daily - 1 sets - 10 reps - Supine Shoulder Flexion Extension AAROM with Dowel  - 1 x daily - 4-5 x weekly - 3 sets - 12 reps - Prone Shoulder Row  - 1 x daily - 3 x weekly - 2 sets - 12 reps - Prone Shoulder Extension - Single Arm  - 1 x daily - 3 x weekly - 2 sets - 12 reps - Shoulder Internal Rotation with Resistance  - 1 x daily - 2-3 x weekly - 3 sets - 12 reps - Standing Row with Resistance with Anchored Resistance at Chest Height Palms Down  - 1 x daily - 2-3 x weekly - 3 sets - 8 reps - Single Arm Scaption with Resistance  - 1 x daily - 3-4 x weekly - 3 sets - 8 reps - Standing Shoulder Horizontal Abduction with Resistance  - 1 x daily - 3-4 x weekly - 3 sets - 8 reps - Shoulder External Rotation with Anchored Resistance  -  1 x daily - 3-4 x weekly - 3 sets - 8 reps   Access Code: [email or web address] URL: [email or web address] Date: 08/17/2023 Prepared by: Ronnie Derby  Exercises - Seated Single Arm Shoulder Scaption  - 1 x daily - 3-4 x weekly - 2 sets - 12 reps - Seated Shoulder External Rotation  - 1 x daily - 3-4 x weekly - 2 sets - 12 reps - Seated Shoulder Abduction with Bent Elbow  - 1 x daily - 3-4 x weekly - 2 sets - 12 reps - Prone Shoulder Row  - 3 x daily - 1 sets - 10 reps - Supine Shoulder Flexion Extension AAROM with Dowel  - 1 x daily - 4-5 x weekly - 3 sets - 12 reps - Sidelying Shoulder External Rotation  - 1 x daily - 3 x weekly - 2 sets - 12 reps - Prone Shoulder Row  - 1 x daily - 3 x weekly - 2 sets - 12 reps - Prone Shoulder Extension - Single Arm  - 1 x daily - 3 x weekly - 2 sets - 12 reps - Shoulder Internal Rotation with Resistance  - 1 x daily - 2-3 x weekly - 3 sets - 12 reps - Shoulder External Rotation and Scapular Retraction with Resistance  - 1 x daily - 2-3 x weekly - 3 sets - 12 reps - Standing Row with Resistance with Anchored  Resistance at Chest Height Palms Down  - 1 x daily - 2-3 x weekly - 3 sets - 8 reps   Access Code: [email or web address] URL: [email or web address] Date: 08/11/2023 Prepared by: Ronnie Derby  Exercises - Seated Single Arm Shoulder Scaption  - 1 x daily - 3-4 x weekly - 2 sets - 12 reps - Seated Shoulder External Rotation  - 1 x daily - 3-4 x weekly - 2 sets - 12 reps - Seated Shoulder Abduction with Bent Elbow  - 1 x daily - 3-4 x weekly - 2 sets - 12 reps - Prone Shoulder Row  - 3 x daily - 1 sets - 10 reps - Supine Shoulder Flexion Extension AAROM with Dowel  - 1 x daily - 4-5 x weekly - 3 sets - 12 reps - Sidelying Shoulder External Rotation  - 1 x daily - 3 x weekly - 2 sets - 12 reps - Prone Shoulder Row  - 1 x daily - 3 x weekly - 2 sets - 12 reps - Prone Shoulder Extension - Single Arm  - 1 x daily - 3 x weekly - 2 sets - 12 reps  Access Code: [email or web address] URL: [email or web address] Date: 08/05/2023 Prepared by: Ronnie Derby  Exercises - Prone Shoulder Row  - 3 x daily - 1 sets - 10 reps - Isometric Shoulder Adduction  - 3 x daily - 1 sets - 10 reps - 3 sec hold - Isometric Shoulder Abduction at Wall  - 3 x daily - 1 sets - 10 reps - 3 sec hold - Standing Isometric Shoulder External Rotation with Doorway  - 3 x daily - 1 sets - 10 reps - 3 sec hold - Standing Isometric Shoulder Flexion with Doorway - Arm Bent  - 3 x daily - 1 sets - 10 reps - 3 sec hold - Standing Isometric Shoulder Internal Rotation at Doorway  - 3 x daily - 1 sets - 10 reps - 3 sec hold - Supine Shoulder Flexion Extension AAROM with Dowel  - 1 x daily - 4-5 x weekly - 3  sets - 12 reps - Sidelying Shoulder External Rotation  - 1 x daily - 3 x weekly - 2 sets - 12 reps - Prone Shoulder Row  - 1 x daily - 3 x weekly - 2 sets - 12 reps - Prone Shoulder Extension - Single Arm  - 1 x daily - 3 x weekly - 2 sets - 12 reps - Prone Shoulder Horizontal Abduction  - 1 x daily - 3 x weekly - 2 sets - 12  reps - Supine Shoulder Flexion Extension Full Range AROM  - 1 x daily - 7 x weekly - 2 sets - 15 reps - Supine Shoulder Abduction AROM  - 1 x daily - 7 x weekly - 2 sets - 15 reps   Access Code: [email or web address] URL: [email or web address] Date: 07/09/2023 Prepared by: Alvera Novel  Exercises - Seated Scapular Retraction  - 3 x daily - 1 sets - 15 reps - Seated Shoulder Shrugs  - 3 x daily - 1 sets - 15 reps - Seated Shoulder Flexion Towel Slide at Table Top  - 3 x daily - 1 sets - 15-20 reps - Seated Shoulder Abduction Towel Slide at Table Top  - 1 x daily - 1 sets - 15-20 reps - Prone Shoulder Row  - 3 x daily - 1 sets - 10 reps - Isometric Shoulder Adduction  - 3 x daily - 1 sets - 10 reps - 3 sec hold - Isometric Shoulder Abduction at Wall  - 3 x daily - 1 sets - 10 reps - 3 sec hold - Standing Isometric Shoulder External Rotation with Doorway  - 3 x daily - 1 sets - 10 reps - 3 sec hold - Standing Isometric Shoulder Flexion with Doorway - Arm Bent  - 3 x daily - 1 sets - 10 reps - 3 sec hold - Standing Isometric Shoulder Internal Rotation at Doorway  - 3 x daily - 1 sets - 10 reps - 3 sec hold  ASSESSMENT:  CLINICAL IMPRESSION: Continuing PT POC with focus on end range flexion and abduction ROM and progressive, gentle shoulder strengthening. Incorporating PNF patterns, hip hinging, and thoracic rotation to ease pt into future recreational activity. Pt with good tolerance for exercises but does demonstrate anterior shoulder tightness limiting overhead and behind the back motion. Updated HEP to address behind back reaching to improve dressing and bathing ADL's. Pt approaching week 16 of protocol. Will adjust POC as appropriate and to address pt's goals. Pt will continue to benefit from skilled Pt services to progress RUE strength and AROM to return to PLOF.   OBJECTIVE IMPAIRMENTS: decreased mobility, decreased ROM, decreased strength, impaired UE functional use, postural  dysfunction, and pain.   ACTIVITY LIMITATIONS: carrying, lifting, bathing, toileting, dressing, reach over head, and hygiene/grooming  PARTICIPATION LIMITATIONS: cleaning, community activity, occupation, and yard work  PERSONAL FACTORS: Age, Education, Fitness, Past/current experiences, Profession, and Time since onset of injury/illness/exacerbation are also affecting patient's functional outcome.   REHAB POTENTIAL: Good  CLINICAL DECISION MAKING: Stable/uncomplicated  EVALUATION COMPLEXITY: Low   GOALS: Goals reviewed with patient? No  SHORT TERM GOALS: Target date: 07/06/23 unless otherwise posted below  Pt will be independent with HEP to improve R shoulder AROM and strength within protocol parameters to improve functional mobility.  Baseline: 06/08/23: Provided initial HEP; 07/02/23: Independent with HEP progressing PROM.   Goal status: MET  2.  Pt will demonstrate R shoulder AROM in flexion to at least 120 degrees in supine for gradual return  to overhead ADL completion. Baseline: 06/08/23: Phase 1 of current protocol (PROM); 08/05/23: 125 degrees Goal status: MET Target Date: 07/21/22 (8 weeks post op)  LONG TERM GOALS: Target date: 10/26/23 unless otherwise posted below  Pt will improve FOTO to target score to demonstrate clinically significant improvement in functional mobility. Baseline: 06/08/23: 47/68; 08/17/23: 32/44; 08/31/23: 64/68 Goal status: ON GOING  2.  Pt will demonstrate full, R shoulder AROM in all planes equal to LUE to demonstrate return to full capacity to complete ADL's.  Baseline: 06/08/23  Active ROM unless otherwise noted Right Eval PROM 11/26 Left eval Right  AROM Seated 08/17/23 Right  AROM Seated 08/31/23  Shoulder flexion 81 165 125 140  Shoulder extension  65    Shoulder abduction  149 91 115  Shoulder adduction      Shoulder internal rotation 55 90    Shoulder external rotation 20 65 60 60  Elbow flexion WNL WNL    Elbow extension 0 WNL     Wrist flexion WNL WNL    Wrist extension WNL WNL    Wrist ulnar deviation      Wrist radial deviation      Wrist pronation WNL WNL    Wrist supination WNL WNL    (Blank rows = not tested) Goal status: INITIAL  3.  Pt will be independent with updated HEP program including periscapular and RTC strength/stability related exercises to begin return to ability to complete work related and recreational related activity.   Baseline: 06/08/23: ; 08/17/23: independent with progressive protocol Goal status: MET  4.  Pt will report return to full work related tasks without strength or other reported functional deficits to demonstrate return to PLOF.  Baseline:  Goal status: INITIAL Target date: 11/28/22 (16 weeks post-op) PLAN:  PT FREQUENCY: 1-2x/week  PT DURATION: 8 weeks  PLANNED INTERVENTIONS: 97164- PT Re-evaluation, 97110-Therapeutic exercises, 97530- Therapeutic activity, 97112- Neuromuscular re-education, 97535- Self Care, 01027- Manual therapy, 97014- Electrical stimulation (unattended), [phone]- Electrical stimulation (manual), Patient/Family education, Dry Needling, Joint mobilization, Spinal manipulation, Spinal mobilization, Cryotherapy, Moist heat, and Biofeedback  PLAN FOR NEXT SESSION: strength training to protocol standards. Extension and behind back AROM. End range flexion and abduction with scapulohumeral rhythm.   Delphia Grates. Fairly IV, PT, DPT Physical Therapist- [facility]  3:27 PM, 09/09/23

## 2023-09-10 ENCOUNTER — Encounter: Payer: Self-pay | Admitting: Student

## 2023-09-10 ENCOUNTER — Ambulatory Visit: Payer: 59 | Attending: Student | Admitting: Student

## 2023-09-10 VITALS — BP 106/64 | HR 72 | Ht 73.0 in | Wt 203.4 lb

## 2023-09-10 DIAGNOSIS — I2510 Atherosclerotic heart disease of native coronary artery without angina pectoris: Secondary | ICD-10-CM

## 2023-09-10 DIAGNOSIS — I4891 Unspecified atrial fibrillation: Secondary | ICD-10-CM | POA: Diagnosis not present

## 2023-09-10 DIAGNOSIS — Z9889 Other specified postprocedural states: Secondary | ICD-10-CM

## 2023-09-10 DIAGNOSIS — E7800 Pure hypercholesterolemia, unspecified: Secondary | ICD-10-CM | POA: Diagnosis not present

## 2023-09-10 DIAGNOSIS — Z8679 Personal history of other diseases of the circulatory system: Secondary | ICD-10-CM

## 2023-09-10 DIAGNOSIS — I7121 Aneurysm of the ascending aorta, without rupture: Secondary | ICD-10-CM | POA: Diagnosis not present

## 2023-09-10 DIAGNOSIS — I10 Essential (primary) hypertension: Secondary | ICD-10-CM

## 2023-09-10 DIAGNOSIS — Z79899 Other long term (current) drug therapy: Secondary | ICD-10-CM | POA: Diagnosis not present

## 2023-09-10 DIAGNOSIS — I9789 Other postprocedural complications and disorders of the circulatory system, not elsewhere classified: Secondary | ICD-10-CM | POA: Diagnosis not present

## 2023-09-10 NOTE — Patient Instructions (Addendum)
 Medication Instructions:  Your Physician recommend you continue on your current medication as directed.    *If you need a refill on your cardiac medications before your next appointment, please call your pharmacy*   Lab Work: Your provider would like for you to have following labs drawn today CBC and BMeT.   If you have labs (blood work) drawn today and your tests are completely normal, you will receive your results only by: MyChart Message (if you have MyChart) OR A paper copy in the mail If you have any lab test that is abnormal or we need to change your treatment, we will call you to review the results.   Testing/Procedures: CT Angiography (CTA) chest/aorta, is a special type of CT scan that uses a computer to produce multi-dimensional views of major blood vessels throughout the body. In CT angiography, a contrast material is injected through an IV to help visualize the blood vessels  Nothing to eat or drink 4 hours prior to test  [facility] [address]  [place], [place] [address]   Follow-Up: At [facility], you and your health needs are our priority.  As part of our continuing mission to provide you with exceptional heart care, we have created designated Provider Care Teams.  These Care Teams include your primary Cardiologist (physician) and Advanced Practice Providers (APPs -  Physician Assistants and Nurse Practitioners) who all work together to provide you with the care you need, when you need it.   Your next appointment:   1 year(s)  Provider:   You may see Lorine Bears, MD or Carlos Levering, NP

## 2023-09-11 LAB — BASIC METABOLIC PANEL
BUN/Creatinine Ratio: 10 (ref 10–24)
BUN: 11 mg/dL (ref 8–27)
CO2: 23 mmol/L (ref 20–29)
Calcium: 9.4 mg/dL (ref 8.6–10.2)
Chloride: 102 mmol/L (ref 96–106)
Creatinine, Ser: 1.08 mg/dL (ref 0.76–1.27)
Glucose: 100 mg/dL — ABNORMAL HIGH (ref 70–99)
Potassium: 4.2 mmol/L (ref 3.5–5.2)
Sodium: 139 mmol/L (ref 134–144)
eGFR: 78 mL/min/{1.73_m2} (ref >59)

## 2023-09-11 LAB — CBC
Hematocrit: 41.2 % (ref 37.5–51.0)
Hemoglobin: 13.6 g/dL (ref 13.0–17.7)
MCH: 30.6 pg (ref 26.6–33.0)
MCHC: 33 g/dL (ref 31.5–35.7)
MCV: 93 fL (ref 79–97)
Platelets: 296 10*3/uL (ref 150–450)
RBC: 4.45 x10E6/uL (ref 4.14–5.80)
RDW: 12.1 % (ref 11.6–15.4)
WBC: 5.1 10*3/uL (ref 3.4–10.8)

## 2023-09-14 ENCOUNTER — Ambulatory Visit
Admission: RE | Admit: 2023-09-14 | Discharge: 2023-09-14 | Disposition: A | Discharge status: Home / Self Care | Payer: 59 | Source: Ambulatory Visit | Attending: Student | Admitting: Student

## 2023-09-14 ENCOUNTER — Ambulatory Visit: Payer: PRIVATE HEALTH INSURANCE | Attending: Orthopedic Surgery

## 2023-09-14 DIAGNOSIS — I9789 Other postprocedural complications and disorders of the circulatory system, not elsewhere classified: Secondary | ICD-10-CM | POA: Insufficient documentation

## 2023-09-14 DIAGNOSIS — M25511 Pain in right shoulder: Secondary | ICD-10-CM | POA: Diagnosis present

## 2023-09-14 DIAGNOSIS — Z79899 Other long term (current) drug therapy: Secondary | ICD-10-CM | POA: Insufficient documentation

## 2023-09-14 DIAGNOSIS — I4891 Unspecified atrial fibrillation: Secondary | ICD-10-CM | POA: Diagnosis not present

## 2023-09-14 DIAGNOSIS — M6281 Muscle weakness (generalized): Secondary | ICD-10-CM | POA: Insufficient documentation

## 2023-09-14 DIAGNOSIS — I2510 Atherosclerotic heart disease of native coronary artery without angina pectoris: Secondary | ICD-10-CM | POA: Diagnosis not present

## 2023-09-14 DIAGNOSIS — I7121 Aneurysm of the ascending aorta, without rupture: Secondary | ICD-10-CM | POA: Insufficient documentation

## 2023-09-14 MED ORDER — IOHEXOL 350 MG/ML SOLN
75.0000 mL | Freq: Once | INTRAVENOUS | Status: AC | PRN
  Administered 2023-09-14: 75 mL via INTRAVENOUS

## 2023-09-14 NOTE — Therapy (Signed)
 OUTPATIENT PHYSICAL THERAPY SHOULDER TREATMENT  Patient Name: Austin Houston MRN: [id number] DOB:01-21-1961, 63 y.o., male Today's Date: 09/14/2023  END OF SESSION:  PT End of Session - 09/14/23 1602     Visit Number 15    Number of Visits 25    Date for PT Re-Evaluation 10/26/23    Authorization Type [facility]    Progress Note Due on Visit 10    PT Start Time 1601    PT Stop Time 1645    PT Time Calculation (min) 44 min    Activity Tolerance Patient tolerated treatment well    Behavior During Therapy WFL for tasks assessed/performed             Past Medical History:  Diagnosis Date   Actinic keratosis    Allergic rhinitis due to pollen    Asthma    vs COPD- taking inhalers- as related to allergies only.   Basal cell carcinoma 03/26/2020   R lat deltoid - ED&C    Dysplastic nevus 03/07/2020   L costal infrapectoral - moderate   Dysplastic nevus 03/07/2020   R post flank above waistline - moderate   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HCC)    s/p partial thyroidectomy (follicular adenoma)-surgery only   GERD (gastroesophageal reflux disease)    Hearing impaired person, bilateral    hearing aida bilateral"high frequency loss   Hyperlipidemia    Hypertension    Prostate cancer (HCC) 01/08/2016   PTSD (post-traumatic stress disorder)    s/p multiple active deployments for [employer], no medications taken currently   Past Surgical History:  Procedure Laterality Date   COLONOSCOPY W/ POLYPECTOMY     age 67 "adenoma"   INGUINAL HERNIA REPAIR     right   LEFT HEART CATH AND CORONARY ANGIOGRAPHY N/A 07/20/2022   Procedure: LEFT HEART CATH AND CORONARY ANGIOGRAPHY;  Surgeon: Tonny Bollman, MD;  Location: [facility] INVASIVE CV LAB;  Service: Cardiovascular;  Laterality: N/A;   REPLACEMENT ASCENDING AORTA N/A 09/07/2022   Procedure: REPLACEMENT ASCENDING AORTA;  Surgeon: Alleen Borne, MD;  Location: [facility] OR;  Service: Open Heart Surgery;  Laterality: N/A;  WITH  CIRC ARREST   ROBOT ASSISTED LAPAROSCOPIC RADICAL PROSTATECTOMY N/A 07/23/2016   Procedure: XI ROBOTIC ASSISTED LAPAROSCOPIC RADICAL PROSTATECTOMY LEVEL 1;  Surgeon: Heloise Purpura, MD;  Location: [facility] ORS;  Service: Urology;  Laterality: N/A;   SHOULDER ARTHROSCOPY WITH ROTATOR CUFF REPAIR AND SUBACROMIAL DECOMPRESSION Right 05/27/2023   Procedure: SHOULDER ARTHROSCOPY WITH ROTATOR CUFF REPAIR AND SUBACROMIAL DECOMPRESSION;  Surgeon: Jones Broom, MD;  Location: [facility] ORS;  Service: Orthopedics;  Laterality: Right;  NEEDS 90 MINUTES   TEE WITHOUT CARDIOVERSION N/A 09/07/2022   Procedure: TRANSESOPHAGEAL ECHOCARDIOGRAM (TEE);  Surgeon: Alleen Borne, MD;  Location: [facility] OR;  Service: Open Heart Surgery;  Laterality: N/A;   THYROIDECTOMY, PARTIAL     [name])   Patient Active Problem List   Diagnosis Date Noted   Anxiety and depression 02/21/2023   Pancytopenia (HCC) 02/21/2023   Elevated liver enzymes 02/21/2023   Alcohol use disorder, severe 02/16/2023   S/P ascending aortic aneurysm repair 09/07/2022   Aneurysm of ascending aorta without rupture (HCC) 07/20/2022   Mild persistent asthma without complication 01/24/2020   Prostate cancer (HCC) 01/08/2016   Varicose veins of bilateral lower extremities with other complications 03/01/2013   Former smokeless tobacco use 11/25/2012   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HCC)    GERD (gastroesophageal reflux disease)    Allergic rhinitis due  to pollen    Hypertension    Hyperlipidemia    PTSD (post-traumatic stress disorder)     PCP: Hannah Beat, MD  REFERRING PROVIDER: Berline Lopes  REFERRING DIAG: RIGHT SHOULDER ROTATOR CUFF TEAR   THERAPY DIAG:  Acute pain of right shoulder  Muscle weakness (generalized)  Rationale for Evaluation and Treatment: Rehabilitation  ONSET DATE: 05/27/23  SUBJECTIVE:                                                                                                                                                                                       SUBJECTIVE STATEMENT: Reports increased pain over the weekend from returning to normal activities like cleaning windows in his sun room and washing the car. Pain currently a 1/10 NPS.    Hand dominance: Right  PERTINENT HISTORY: Pt is a pleasant 63 y.o. male referred to OPPT s/p R RTC tear repair and subacromial decompression 05/27/23. Pt reports having a fall at work in the [employer] onto his R shoulder. Happened December 5th, 2023. Waited for his RTC surgery as he had open heart surgery for ascending aortic aneurysm in February 2024. Pain is minimal, controlled via Tylenol. R handed. Pt works in [employer] lab at [employer] requiring BUE use such as mobilizing patients, transfers, and use of equipment for performing catheterizations.   PAIN:  Are you having pain? None on arrival; noted soreness during end-range stretches and soreness at end of session.   PRECAUTIONS: Other: RTC repair protocol  Weeks Six-Twelve Precautions: -no lifting -no supporting BW with hands and arms -no sudden jerking -no excessive behind the back movements -avoid upper extremity ergometer    RED FLAGS: None   WEIGHT BEARING RESTRICTIONS: No  FALLS:  Has patient fallen in last 6 months? No  LIVING ENVIRONMENT: Lives with: lives with their spouse Lives in: House/apartment  OCCUPATION: Cardiovascular Specialist  PLOF: Independent  PATIENT GOALS:Return to occupation, return to PLOF.  NEXT MD VISIT: N/A  OBJECTIVE:  Note: Objective measures were completed at Evaluation unless otherwise noted.  DIAGNOSTIC FINDINGS:  N/A  PATIENT SURVEYS:  FOTO 47/68  COGNITION: Overall cognitive status: Within functional limits for tasks assessed     SENSATION: WFL  POSTURE: Mildly rounded shoulders  CERVICAL AROM: Flexion: 70 Extension: 55 Rotation R/L: 60/70 Lateral flexion R/L: 45/45  UPPER EXTREMITY ROM:   Active ROM unless otherwise  noted Right Eval PROM 11/26 Left eval  Shoulder flexion 81 165  Shoulder extension  65  Shoulder abduction  149  Shoulder adduction    Shoulder internal rotation 55 90  Shoulder external rotation 20 65  Elbow flexion WNL WNL  Elbow extension 0 WNL  Wrist flexion WNL WNL  Wrist extension WNL WNL  Wrist ulnar deviation    Wrist radial deviation    Wrist pronation WNL WNL  Wrist supination WNL WNL  (Blank rows = not tested)  UPPER EXTREMITY MMT:  MMT Right Eval Left Eval  Shoulder flexion deferred 5  Shoulder extension deferred   Shoulder abduction deferred 5  Shoulder adduction    Shoulder internal rotation deferred 5  Shoulder external rotation deferred 5  Middle trapezius    Lower trapezius    Elbow flexion  5  Elbow extension  4 (seated)   TODAY'S TREATMENT:                                                                                                                                         DATE: 09/14/23  There.exBurnetta Sabin lying shoulder flexion x20 AAROM for shoulder warm up   Hook lying wall angels for shoulder abduction AROM, x15   Hook lying serratus punches: 5# DB, RUE 3x12  Seated RUE shoulder ER in 90 deg abduction: 3x8, GTB    There.Act:   Standing D2 Flexion pattern with RUE, 3# DB, 2x6   Standing behind the back 3# DB passes for shoulder extension, IR, and adduction. 2x12   Hip hinge, RUE row with blue TB: 3x12  Wall push ups: 2x15     PATIENT EDUCATION: Education details: Updated precautions/restrictions/HEP per phase 2 protocol Person educated: Patient Education method: Explanation Education comprehension: verbalized understanding, returned demonstration, verbal cues required, and tactile cues required  HOME EXERCISE PROGRAM: Access Code: [email or web address] URL: [email or web address] Date: 08/31/2023 Prepared by: Ronnie Derby  Exercises - Seated Shoulder Abduction with Bent Elbow  - 1 x daily - 3-4 x weekly - 2 sets - 12 reps -  Prone Shoulder Row  - 3 x daily - 1 sets - 10 reps - Supine Shoulder Flexion Extension AAROM with Dowel  - 1 x daily - 4-5 x weekly - 3 sets - 12 reps - Prone Shoulder Row  - 1 x daily - 3 x weekly - 2 sets - 12 reps - Prone Shoulder Extension - Single Arm  - 1 x daily - 3 x weekly - 2 sets - 12 reps - Shoulder Internal Rotation with Resistance  - 1 x daily - 2-3 x weekly - 3 sets - 12 reps - Standing Row with Resistance with Anchored Resistance at Chest Height Palms Down  - 1 x daily - 2-3 x weekly - 3 sets - 8 reps - Single Arm Scaption with Resistance  - 1 x daily - 3-4 x weekly - 3 sets - 8 reps - Standing Shoulder Horizontal Abduction with Resistance  - 1 x daily - 3-4 x weekly - 3 sets - 8 reps - Shoulder External Rotation with Anchored Resistance  - 1 x daily - 3-4 x weekly -  3 sets - 8 reps   Access Code: [email or web address] URL: [email or web address] Date: 08/17/2023 Prepared by: Ronnie Derby  Exercises - Seated Single Arm Shoulder Scaption  - 1 x daily - 3-4 x weekly - 2 sets - 12 reps - Seated Shoulder External Rotation  - 1 x daily - 3-4 x weekly - 2 sets - 12 reps - Seated Shoulder Abduction with Bent Elbow  - 1 x daily - 3-4 x weekly - 2 sets - 12 reps - Prone Shoulder Row  - 3 x daily - 1 sets - 10 reps - Supine Shoulder Flexion Extension AAROM with Dowel  - 1 x daily - 4-5 x weekly - 3 sets - 12 reps - Sidelying Shoulder External Rotation  - 1 x daily - 3 x weekly - 2 sets - 12 reps - Prone Shoulder Row  - 1 x daily - 3 x weekly - 2 sets - 12 reps - Prone Shoulder Extension - Single Arm  - 1 x daily - 3 x weekly - 2 sets - 12 reps - Shoulder Internal Rotation with Resistance  - 1 x daily - 2-3 x weekly - 3 sets - 12 reps - Shoulder External Rotation and Scapular Retraction with Resistance  - 1 x daily - 2-3 x weekly - 3 sets - 12 reps - Standing Row with Resistance with Anchored Resistance at Chest Height Palms Down  - 1 x daily - 2-3 x weekly - 3 sets - 8  reps   Access Code: [email or web address] URL: [email or web address] Date: 08/11/2023 Prepared by: Ronnie Derby  Exercises - Seated Single Arm Shoulder Scaption  - 1 x daily - 3-4 x weekly - 2 sets - 12 reps - Seated Shoulder External Rotation  - 1 x daily - 3-4 x weekly - 2 sets - 12 reps - Seated Shoulder Abduction with Bent Elbow  - 1 x daily - 3-4 x weekly - 2 sets - 12 reps - Prone Shoulder Row  - 3 x daily - 1 sets - 10 reps - Supine Shoulder Flexion Extension AAROM with Dowel  - 1 x daily - 4-5 x weekly - 3 sets - 12 reps - Sidelying Shoulder External Rotation  - 1 x daily - 3 x weekly - 2 sets - 12 reps - Prone Shoulder Row  - 1 x daily - 3 x weekly - 2 sets - 12 reps - Prone Shoulder Extension - Single Arm  - 1 x daily - 3 x weekly - 2 sets - 12 reps  Access Code: [email or web address] URL: [email or web address] Date: 08/05/2023 Prepared by: Ronnie Derby  Exercises - Prone Shoulder Row  - 3 x daily - 1 sets - 10 reps - Isometric Shoulder Adduction  - 3 x daily - 1 sets - 10 reps - 3 sec hold - Isometric Shoulder Abduction at Wall  - 3 x daily - 1 sets - 10 reps - 3 sec hold - Standing Isometric Shoulder External Rotation with Doorway  - 3 x daily - 1 sets - 10 reps - 3 sec hold - Standing Isometric Shoulder Flexion with Doorway - Arm Bent  - 3 x daily - 1 sets - 10 reps - 3 sec hold - Standing Isometric Shoulder Internal Rotation at Doorway  - 3 x daily - 1 sets - 10 reps - 3 sec hold - Supine Shoulder Flexion Extension AAROM with Dowel  - 1 x daily - 4-5 x weekly - 3 sets - 12 reps - Sidelying Shoulder External  Rotation  - 1 x daily - 3 x weekly - 2 sets - 12 reps - Prone Shoulder Row  - 1 x daily - 3 x weekly - 2 sets - 12 reps - Prone Shoulder Extension - Single Arm  - 1 x daily - 3 x weekly - 2 sets - 12 reps - Prone Shoulder Horizontal Abduction  - 1 x daily - 3 x weekly - 2 sets - 12 reps - Supine Shoulder Flexion Extension Full Range AROM  - 1 x daily - 7 x  weekly - 2 sets - 15 reps - Supine Shoulder Abduction AROM  - 1 x daily - 7 x weekly - 2 sets - 15 reps   Access Code: [email or web address] URL: [email or web address] Date: 07/09/2023 Prepared by: Alvera Novel  Exercises - Seated Scapular Retraction  - 3 x daily - 1 sets - 15 reps - Seated Shoulder Shrugs  - 3 x daily - 1 sets - 15 reps - Seated Shoulder Flexion Towel Slide at Table Top  - 3 x daily - 1 sets - 15-20 reps - Seated Shoulder Abduction Towel Slide at Table Top  - 1 x daily - 1 sets - 15-20 reps - Prone Shoulder Row  - 3 x daily - 1 sets - 10 reps - Isometric Shoulder Adduction  - 3 x daily - 1 sets - 10 reps - 3 sec hold - Isometric Shoulder Abduction at Wall  - 3 x daily - 1 sets - 10 reps - 3 sec hold - Standing Isometric Shoulder External Rotation with Doorway  - 3 x daily - 1 sets - 10 reps - 3 sec hold - Standing Isometric Shoulder Flexion with Doorway - Arm Bent  - 3 x daily - 1 sets - 10 reps - 3 sec hold - Standing Isometric Shoulder Internal Rotation at Doorway  - 3 x daily - 1 sets - 10 reps - 3 sec hold  ASSESSMENT:  CLINICAL IMPRESSION: Continuing PT POC with focus on end range flexion and abduction ROM and progressive, gentle shoulder strengthening. Continuing to work PNF patterns, RTC stability in abducted ranges to progress RUE strength. Pt did have loud, joint cavitation that was painful with PNF with pain is mild afterwards and no R shoulder AROM loss. Will continue to progress RUE strength as able and return to sport based exercises as pt has goals to begin golfing. Pt will continue to benefit from skilled Pt services to progress RUE strength and AROM to return to PLOF.   OBJECTIVE IMPAIRMENTS: decreased mobility, decreased ROM, decreased strength, impaired UE functional use, postural dysfunction, and pain.   ACTIVITY LIMITATIONS: carrying, lifting, bathing, toileting, dressing, reach over head, and hygiene/grooming  PARTICIPATION LIMITATIONS: cleaning,  community activity, occupation, and yard work  PERSONAL FACTORS: Age, Education, Fitness, Past/current experiences, Profession, and Time since onset of injury/illness/exacerbation are also affecting patient's functional outcome.   REHAB POTENTIAL: Good  CLINICAL DECISION MAKING: Stable/uncomplicated  EVALUATION COMPLEXITY: Low   GOALS: Goals reviewed with patient? No  SHORT TERM GOALS: Target date: 07/06/23 unless otherwise posted below  Pt will be independent with HEP to improve R shoulder AROM and strength within protocol parameters to improve functional mobility.  Baseline: 06/08/23: Provided initial HEP; 07/02/23: Independent with HEP progressing PROM.   Goal status: MET  2.  Pt will demonstrate R shoulder AROM in flexion to at least 120 degrees in supine for gradual return to overhead ADL completion. Baseline: 06/08/23: Phase 1 of current protocol (PROM); 08/05/23: 125  degrees Goal status: MET Target Date: 07/21/22 (8 weeks post op)  LONG TERM GOALS: Target date: 10/26/23 unless otherwise posted below  Pt will improve FOTO to target score to demonstrate clinically significant improvement in functional mobility. Baseline: 06/08/23: 47/68; 08/17/23: 16/10; 08/31/23: 64/68 Goal status: ON GOING  2.  Pt will demonstrate full, R shoulder AROM in all planes equal to LUE to demonstrate return to full capacity to complete ADL's.  Baseline: 06/08/23  Active ROM unless otherwise noted Right Eval PROM 11/26 Left eval Right  AROM Seated 08/17/23 Right  AROM Seated 08/31/23  Shoulder flexion 81 165 125 140  Shoulder extension  65    Shoulder abduction  149 91 115  Shoulder adduction      Shoulder internal rotation 55 90    Shoulder external rotation 20 65 60 60  Elbow flexion WNL WNL    Elbow extension 0 WNL    Wrist flexion WNL WNL    Wrist extension WNL WNL    Wrist ulnar deviation      Wrist radial deviation      Wrist pronation WNL WNL    Wrist supination WNL WNL    (Blank  rows = not tested) Goal status: INITIAL  3.  Pt will be independent with updated HEP program including periscapular and RTC strength/stability related exercises to begin return to ability to complete work related and recreational related activity.   Baseline: 06/08/23: ; 08/17/23: independent with progressive protocol Goal status: MET  4.  Pt will report return to full work related tasks without strength or other reported functional deficits to demonstrate return to PLOF.  Baseline:  Goal status: INITIAL Target date: 11/28/22 (16 weeks post-op) PLAN:  PT FREQUENCY: 1-2x/week  PT DURATION: 8 weeks  PLANNED INTERVENTIONS: 97164- PT Re-evaluation, 97110-Therapeutic exercises, 97530- Therapeutic activity, 97112- Neuromuscular re-education, 97535- Self Care, 96045- Manual therapy, 97014- Electrical stimulation (unattended), [phone]- Electrical stimulation (manual), Patient/Family education, Dry Needling, Joint mobilization, Spinal manipulation, Spinal mobilization, Cryotherapy, Moist heat, and Biofeedback  PLAN FOR NEXT SESSION: strength training to protocol standards. Extension and behind back AROM. End range flexion and abduction with scapulohumeral rhythm. Thoracic rotation mimicking golf swing.  Delphia Grates. Fairly IV, PT, DPT Physical Therapist- [place]  [facility]  4:51 PM, 09/14/23

## 2023-09-16 ENCOUNTER — Ambulatory Visit: Payer: PRIVATE HEALTH INSURANCE

## 2023-09-16 DIAGNOSIS — M25511 Pain in right shoulder: Secondary | ICD-10-CM | POA: Diagnosis not present

## 2023-09-16 DIAGNOSIS — M6281 Muscle weakness (generalized): Secondary | ICD-10-CM

## 2023-09-16 NOTE — Therapy (Signed)
 OUTPATIENT PHYSICAL THERAPY SHOULDER TREATMENT  Patient Name: Austin Houston MRN: [id number] DOB:06/20/1961, 63 y.o., male Today's Date: 09/16/2023  END OF SESSION:  PT End of Session - 09/16/23 1511     Visit Number 16    Number of Visits 25    Date for PT Re-Evaluation 10/26/23    Authorization Type [facility]    Progress Note Due on Visit 10    PT Start Time 1511    PT Stop Time 1555    PT Time Calculation (min) 44 min    Activity Tolerance Patient tolerated treatment well    Behavior During Therapy WFL for tasks assessed/performed             Past Medical History:  Diagnosis Date   Actinic keratosis    Allergic rhinitis due to pollen    Asthma    vs COPD- taking inhalers- as related to allergies only.   Basal cell carcinoma 03/26/2020   R lat deltoid - ED&C    Dysplastic nevus 03/07/2020   L costal infrapectoral - moderate   Dysplastic nevus 03/07/2020   R post flank above waistline - moderate   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HCC)    s/p partial thyroidectomy (follicular adenoma)-surgery only   GERD (gastroesophageal reflux disease)    Hearing impaired person, bilateral    hearing aida bilateral"high frequency loss   Hyperlipidemia    Hypertension    Prostate cancer (HCC) 01/08/2016   PTSD (post-traumatic stress disorder)    s/p multiple active deployments for [employer], no medications taken currently   Past Surgical History:  Procedure Laterality Date   COLONOSCOPY W/ POLYPECTOMY     age 15 "adenoma"   INGUINAL HERNIA REPAIR     right   LEFT HEART CATH AND CORONARY ANGIOGRAPHY N/A 07/20/2022   Procedure: LEFT HEART CATH AND CORONARY ANGIOGRAPHY;  Surgeon: Tonny Bollman, MD;  Location: [facility] INVASIVE CV LAB;  Service: Cardiovascular;  Laterality: N/A;   REPLACEMENT ASCENDING AORTA N/A 09/07/2022   Procedure: REPLACEMENT ASCENDING AORTA;  Surgeon: Alleen Borne, MD;  Location: [facility] OR;  Service: Open Heart Surgery;  Laterality: N/A;  WITH  CIRC ARREST   ROBOT ASSISTED LAPAROSCOPIC RADICAL PROSTATECTOMY N/A 07/23/2016   Procedure: XI ROBOTIC ASSISTED LAPAROSCOPIC RADICAL PROSTATECTOMY LEVEL 1;  Surgeon: Heloise Purpura, MD;  Location: [facility] ORS;  Service: Urology;  Laterality: N/A;   SHOULDER ARTHROSCOPY WITH ROTATOR CUFF REPAIR AND SUBACROMIAL DECOMPRESSION Right 05/27/2023   Procedure: SHOULDER ARTHROSCOPY WITH ROTATOR CUFF REPAIR AND SUBACROMIAL DECOMPRESSION;  Surgeon: Jones Broom, MD;  Location: [facility] ORS;  Service: Orthopedics;  Laterality: Right;  NEEDS 90 MINUTES   TEE WITHOUT CARDIOVERSION N/A 09/07/2022   Procedure: TRANSESOPHAGEAL ECHOCARDIOGRAM (TEE);  Surgeon: Alleen Borne, MD;  Location: [facility] OR;  Service: Open Heart Surgery;  Laterality: N/A;   THYROIDECTOMY, PARTIAL     [facility])   Patient Active Problem List   Diagnosis Date Noted   Anxiety and depression 02/21/2023   Pancytopenia (HCC) 02/21/2023   Elevated liver enzymes 02/21/2023   Alcohol use disorder, severe 02/16/2023   S/P ascending aortic aneurysm repair 09/07/2022   Aneurysm of ascending aorta without rupture (HCC) 07/20/2022   Mild persistent asthma without complication 01/24/2020   Prostate cancer (HCC) 01/08/2016   Varicose veins of bilateral lower extremities with other complications 03/01/2013   Former smokeless tobacco use 11/25/2012   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HCC)    GERD (gastroesophageal reflux disease)    Allergic rhinitis due  to pollen    Hypertension    Hyperlipidemia    PTSD (post-traumatic stress disorder)     PCP: Hannah Beat, MD  REFERRING PROVIDER: Berline Lopes  REFERRING DIAG: RIGHT SHOULDER ROTATOR CUFF TEAR   THERAPY DIAG:  Acute pain of right shoulder  Muscle weakness (generalized)  Rationale for Evaluation and Treatment: Rehabilitation  ONSET DATE: 05/27/23  SUBJECTIVE:                                                                                                                                                                                       SUBJECTIVE STATEMENT: Reports increased pain after last session but has subsided prior to today.   Hand dominance: Right  PERTINENT HISTORY: Pt is a pleasant 63 y.o. male referred to OPPT s/p R RTC tear repair and subacromial decompression 05/27/23. Pt reports having a fall at work in the [employer] onto his R shoulder. Happened December 5th, 2023. Waited for his RTC surgery as he had open heart surgery for ascending aortic aneurysm in February 2024. Pain is minimal, controlled via Tylenol. R handed. Pt works in [employer] lab at [employer] requiring BUE use such as mobilizing patients, transfers, and use of equipment for performing catheterizations.   PAIN:  Are you having pain? None on arrival; noted soreness during end-range stretches and soreness at end of session.   PRECAUTIONS: Other: RTC repair protocol  Weeks Six-Twelve Precautions: -no lifting -no supporting BW with hands and arms -no sudden jerking -no excessive behind the back movements -avoid upper extremity ergometer    RED FLAGS: None   WEIGHT BEARING RESTRICTIONS: No  FALLS:  Has patient fallen in last 6 months? No  LIVING ENVIRONMENT: Lives with: lives with their spouse Lives in: House/apartment  OCCUPATION: Cardiovascular Specialist  PLOF: Independent  PATIENT GOALS:Return to occupation, return to PLOF.  NEXT MD VISIT: N/A  OBJECTIVE:  Note: Objective measures were completed at Evaluation unless otherwise noted.  DIAGNOSTIC FINDINGS:  N/A  PATIENT SURVEYS:  FOTO 47/68  COGNITION: Overall cognitive status: Within functional limits for tasks assessed     SENSATION: WFL  POSTURE: Mildly rounded shoulders  CERVICAL AROM: Flexion: 70 Extension: 55 Rotation R/L: 60/70 Lateral flexion R/L: 45/45  UPPER EXTREMITY ROM:   Active ROM unless otherwise noted Right Eval PROM 11/26 Left eval  Shoulder flexion 81 165  Shoulder extension   65  Shoulder abduction  149  Shoulder adduction    Shoulder internal rotation 55 90  Shoulder external rotation 20 65  Elbow flexion WNL WNL  Elbow extension 0 WNL  Wrist flexion WNL WNL  Wrist extension WNL WNL  Wrist  ulnar deviation    Wrist radial deviation    Wrist pronation WNL WNL  Wrist supination WNL WNL  (Blank rows = not tested)  UPPER EXTREMITY MMT:  MMT Right Eval Left Eval  Shoulder flexion deferred 5  Shoulder extension deferred   Shoulder abduction deferred 5  Shoulder adduction    Shoulder internal rotation deferred 5  Shoulder external rotation deferred 5  Middle trapezius    Lower trapezius    Elbow flexion  5  Elbow extension  4 (seated)   TODAY'S TREATMENT:                                                                                                                                         DATE: 09/16/23  There.ex:   Side lying open books: 12/side   Lat pull down: 2x15, 35#   Scap retractions: 2x15, 35#  Seated chest press: 3x12, 25#. Min multi modal cuing for form/technique.   There.act:  Resisted R sided thoracic rotation in golf stance swing: blue TB, 3x8  RUE Resisted D1 pattern flexion to extension with blue TB: 3x15  Standing RUE shoulder ER in 90 deg abduction: 3x6, RTB with light TC's on elbows   PATIENT EDUCATION: Education details: Updated precautions/restrictions/HEP per phase 2 protocol Person educated: Patient Education method: Explanation Education comprehension: verbalized understanding, returned demonstration, verbal cues required, and tactile cues required  HOME EXERCISE PROGRAM: Access Code: [email or web address] URL: [email or web address] Date: 08/31/2023 Prepared by: Ronnie Derby  Exercises - Seated Shoulder Abduction with Bent Elbow  - 1 x daily - 3-4 x weekly - 2 sets - 12 reps - Prone Shoulder Row  - 3 x daily - 1 sets - 10 reps - Supine Shoulder Flexion Extension AAROM with Dowel  - 1 x daily - 4-5 x weekly -  3 sets - 12 reps - Prone Shoulder Row  - 1 x daily - 3 x weekly - 2 sets - 12 reps - Prone Shoulder Extension - Single Arm  - 1 x daily - 3 x weekly - 2 sets - 12 reps - Shoulder Internal Rotation with Resistance  - 1 x daily - 2-3 x weekly - 3 sets - 12 reps - Standing Row with Resistance with Anchored Resistance at Chest Height Palms Down  - 1 x daily - 2-3 x weekly - 3 sets - 8 reps - Single Arm Scaption with Resistance  - 1 x daily - 3-4 x weekly - 3 sets - 8 reps - Standing Shoulder Horizontal Abduction with Resistance  - 1 x daily - 3-4 x weekly - 3 sets - 8 reps - Shoulder External Rotation with Anchored Resistance  - 1 x daily - 3-4 x weekly - 3 sets - 8 reps   Access Code: [email or web address] URL: [email or web address] Date: 08/17/2023 Prepared by: Ronnie Derby  Exercises - Seated Single Arm Shoulder Scaption  - 1 x daily -  3-4 x weekly - 2 sets - 12 reps - Seated Shoulder External Rotation  - 1 x daily - 3-4 x weekly - 2 sets - 12 reps - Seated Shoulder Abduction with Bent Elbow  - 1 x daily - 3-4 x weekly - 2 sets - 12 reps - Prone Shoulder Row  - 3 x daily - 1 sets - 10 reps - Supine Shoulder Flexion Extension AAROM with Dowel  - 1 x daily - 4-5 x weekly - 3 sets - 12 reps - Sidelying Shoulder External Rotation  - 1 x daily - 3 x weekly - 2 sets - 12 reps - Prone Shoulder Row  - 1 x daily - 3 x weekly - 2 sets - 12 reps - Prone Shoulder Extension - Single Arm  - 1 x daily - 3 x weekly - 2 sets - 12 reps - Shoulder Internal Rotation with Resistance  - 1 x daily - 2-3 x weekly - 3 sets - 12 reps - Shoulder External Rotation and Scapular Retraction with Resistance  - 1 x daily - 2-3 x weekly - 3 sets - 12 reps - Standing Row with Resistance with Anchored Resistance at Chest Height Palms Down  - 1 x daily - 2-3 x weekly - 3 sets - 8 reps   Access Code: [email or web address] URL: [email or web address] Date: 08/11/2023 Prepared by: Ronnie Derby  Exercises - Seated  Single Arm Shoulder Scaption  - 1 x daily - 3-4 x weekly - 2 sets - 12 reps - Seated Shoulder External Rotation  - 1 x daily - 3-4 x weekly - 2 sets - 12 reps - Seated Shoulder Abduction with Bent Elbow  - 1 x daily - 3-4 x weekly - 2 sets - 12 reps - Prone Shoulder Row  - 3 x daily - 1 sets - 10 reps - Supine Shoulder Flexion Extension AAROM with Dowel  - 1 x daily - 4-5 x weekly - 3 sets - 12 reps - Sidelying Shoulder External Rotation  - 1 x daily - 3 x weekly - 2 sets - 12 reps - Prone Shoulder Row  - 1 x daily - 3 x weekly - 2 sets - 12 reps - Prone Shoulder Extension - Single Arm  - 1 x daily - 3 x weekly - 2 sets - 12 reps  Access Code: [email or web address] URL: [email or web address] Date: 08/05/2023 Prepared by: Ronnie Derby  Exercises - Prone Shoulder Row  - 3 x daily - 1 sets - 10 reps - Isometric Shoulder Adduction  - 3 x daily - 1 sets - 10 reps - 3 sec hold - Isometric Shoulder Abduction at Wall  - 3 x daily - 1 sets - 10 reps - 3 sec hold - Standing Isometric Shoulder External Rotation with Doorway  - 3 x daily - 1 sets - 10 reps - 3 sec hold - Standing Isometric Shoulder Flexion with Doorway - Arm Bent  - 3 x daily - 1 sets - 10 reps - 3 sec hold - Standing Isometric Shoulder Internal Rotation at Doorway  - 3 x daily - 1 sets - 10 reps - 3 sec hold - Supine Shoulder Flexion Extension AAROM with Dowel  - 1 x daily - 4-5 x weekly - 3 sets - 12 reps - Sidelying Shoulder External Rotation  - 1 x daily - 3 x weekly - 2 sets - 12 reps - Prone Shoulder Row  - 1 x daily - 3 x weekly - 2 sets -  12 reps - Prone Shoulder Extension - Single Arm  - 1 x daily - 3 x weekly - 2 sets - 12 reps - Prone Shoulder Horizontal Abduction  - 1 x daily - 3 x weekly - 2 sets - 12 reps - Supine Shoulder Flexion Extension Full Range AROM  - 1 x daily - 7 x weekly - 2 sets - 15 reps - Supine Shoulder Abduction AROM  - 1 x daily - 7 x weekly - 2 sets - 15 reps   Access Code: [email or web address] URL:  [email or web address] Date: 07/09/2023 Prepared by: Alvera Novel  Exercises - Seated Scapular Retraction  - 3 x daily - 1 sets - 15 reps - Seated Shoulder Shrugs  - 3 x daily - 1 sets - 15 reps - Seated Shoulder Flexion Towel Slide at Table Top  - 3 x daily - 1 sets - 15-20 reps - Seated Shoulder Abduction Towel Slide at Table Top  - 1 x daily - 1 sets - 15-20 reps - Prone Shoulder Row  - 3 x daily - 1 sets - 10 reps - Isometric Shoulder Adduction  - 3 x daily - 1 sets - 10 reps - 3 sec hold - Isometric Shoulder Abduction at Wall  - 3 x daily - 1 sets - 10 reps - 3 sec hold - Standing Isometric Shoulder External Rotation with Doorway  - 3 x daily - 1 sets - 10 reps - 3 sec hold - Standing Isometric Shoulder Flexion with Doorway - Arm Bent  - 3 x daily - 1 sets - 10 reps - 3 sec hold - Standing Isometric Shoulder Internal Rotation at Doorway  - 3 x daily - 1 sets - 10 reps - 3 sec hold  ASSESSMENT:  CLINICAL IMPRESSION: Continuing PT POC with focus on end range flexion and abduction ROM and progressive, gentle shoulder strengthening. Introducing thoracic mobility and resisted thoracic rotation to prep pt return to sport for golfing recreation. T now s/p 16 weeks in protocol thus introducing resisted machine exercises for periscapular strengthening. Also progressing RTC stability in abducted ranges of motion with success. Pt with mild discomfort post session but overall feels better than last session. Encouraged 24-48 hours of rest to allow for muscular recovery. PT to continue to progress strengthening to return to recreational activity and ADL's. Pt will continue to benefit from skilled Pt services to progress RUE strength and AROM to return to PLOF.   OBJECTIVE IMPAIRMENTS: decreased mobility, decreased ROM, decreased strength, impaired UE functional use, postural dysfunction, and pain.   ACTIVITY LIMITATIONS: carrying, lifting, bathing, toileting, dressing, reach over head,  and hygiene/grooming  PARTICIPATION LIMITATIONS: cleaning, community activity, occupation, and yard work  PERSONAL FACTORS: Age, Education, Fitness, Past/current experiences, Profession, and Time since onset of injury/illness/exacerbation are also affecting patient's functional outcome.   REHAB POTENTIAL: Good  CLINICAL DECISION MAKING: Stable/uncomplicated  EVALUATION COMPLEXITY: Low   GOALS: Goals reviewed with patient? No  SHORT TERM GOALS: Target date: 07/06/23 unless otherwise posted below  Pt will be independent with HEP to improve R shoulder AROM and strength within protocol parameters to improve functional mobility.  Baseline: 06/08/23: Provided initial HEP; 07/02/23: Independent with HEP progressing PROM.   Goal status: MET  2.  Pt will demonstrate R shoulder AROM in flexion to at least 120 degrees in supine for gradual return to overhead ADL completion. Baseline: 06/08/23: Phase 1 of current protocol (PROM); 08/05/23: 125 degrees Goal status: MET Target Date: 07/21/22 (8 weeks post op)  LONG TERM GOALS: Target date: 10/26/23 unless otherwise posted below  Pt will improve FOTO to target score to demonstrate clinically significant improvement in functional mobility. Baseline: 06/08/23: 47/68; 08/17/23: 40/98; 08/31/23: 64/68 Goal status: ON GOING  2.  Pt will demonstrate full, R shoulder AROM in all planes equal to LUE to demonstrate return to full capacity to complete ADL's.  Baseline: 06/08/23  Active ROM unless otherwise noted Right Eval PROM 11/26 Left eval Right  AROM Seated 08/17/23 Right  AROM Seated 08/31/23  Shoulder flexion 81 165 125 140  Shoulder extension  65    Shoulder abduction  149 91 115  Shoulder adduction      Shoulder internal rotation 55 90    Shoulder external rotation 20 65 60 60  Elbow flexion WNL WNL    Elbow extension 0 WNL    Wrist flexion WNL WNL    Wrist extension WNL WNL    Wrist ulnar deviation      Wrist radial deviation       Wrist pronation WNL WNL    Wrist supination WNL WNL    (Blank rows = not tested) Goal status: INITIAL  3.  Pt will be independent with updated HEP program including periscapular and RTC strength/stability related exercises to begin return to ability to complete work related and recreational related activity.   Baseline: 06/08/23: ; 08/17/23: independent with progressive protocol Goal status: MET  4.  Pt will report return to full work related tasks without strength or other reported functional deficits to demonstrate return to PLOF.  Baseline:  Goal status: INITIAL Target date: 11/28/22 (16 weeks post-op) PLAN:  PT FREQUENCY: 1-2x/week  PT DURATION: 8 weeks  PLANNED INTERVENTIONS: 97164- PT Re-evaluation, 97110-Therapeutic exercises, 97530- Therapeutic activity, 97112- Neuromuscular re-education, 97535- Self Care, 11914- Manual therapy, 97014- Electrical stimulation (unattended), [phone]- Electrical stimulation (manual), Patient/Family education, Dry Needling, Joint mobilization, Spinal manipulation, Spinal mobilization, Cryotherapy, Moist heat, and Biofeedback  PLAN FOR NEXT SESSION: week 16 of protocol. Extension and behind back AROM. End range flexion and abduction with scapulohumeral rhythm. Thoracic rotation mimicking golf swing.  Delphia Grates. Fairly IV, PT, DPT Physical Therapist- [place]  [facility]  4:08 PM, 09/16/23

## 2023-09-20 ENCOUNTER — Ambulatory Visit: Payer: PRIVATE HEALTH INSURANCE

## 2023-09-22 ENCOUNTER — Ambulatory Visit: Payer: PRIVATE HEALTH INSURANCE

## 2023-09-22 DIAGNOSIS — M25511 Pain in right shoulder: Secondary | ICD-10-CM

## 2023-09-22 DIAGNOSIS — M6281 Muscle weakness (generalized): Secondary | ICD-10-CM

## 2023-09-22 NOTE — Therapy (Unsigned)
 OUTPATIENT PHYSICAL THERAPY SHOULDER TREATMENT  Patient Name: Austin Houston MRN: [id number] DOB:10-Oct-1960, 63 y.o., male Today's Date: 09/23/2023  END OF SESSION:  PT End of Session - 09/22/23 1602     Visit Number 17    Number of Visits 25    Date for PT Re-Evaluation 10/26/23    Authorization Type [facility]    Progress Note Due on Visit 10    PT Start Time 1601    PT Stop Time 1645    PT Time Calculation (min) 44 min    Activity Tolerance Patient tolerated treatment well    Behavior During Therapy WFL for tasks assessed/performed             Past Medical History:  Diagnosis Date   Actinic keratosis    Allergic rhinitis due to pollen    Asthma    vs COPD- taking inhalers- as related to allergies only.   Basal cell carcinoma 03/26/2020   R lat deltoid - ED&C    Dysplastic nevus 03/07/2020   L costal infrapectoral - moderate   Dysplastic nevus 03/07/2020   R post flank above waistline - moderate   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HCC)    s/p partial thyroidectomy (follicular adenoma)-surgery only   GERD (gastroesophageal reflux disease)    Hearing impaired person, bilateral    hearing aida bilateral"high frequency loss   Hyperlipidemia    Hypertension    Prostate cancer (HCC) 01/08/2016   PTSD (post-traumatic stress disorder)    s/p multiple active deployments for [employer], no medications taken currently   Past Surgical History:  Procedure Laterality Date   COLONOSCOPY W/ POLYPECTOMY     age 35 "adenoma"   INGUINAL HERNIA REPAIR     right   LEFT HEART CATH AND CORONARY ANGIOGRAPHY N/A 07/20/2022   Procedure: LEFT HEART CATH AND CORONARY ANGIOGRAPHY;  Surgeon: Tonny Bollman, MD;  Location: [facility] INVASIVE CV LAB;  Service: Cardiovascular;  Laterality: N/A;   REPLACEMENT ASCENDING AORTA N/A 09/07/2022   Procedure: REPLACEMENT ASCENDING AORTA;  Surgeon: Alleen Borne, MD;  Location: [facility] OR;  Service: Open Heart Surgery;  Laterality: N/A;   WITH CIRC ARREST   ROBOT ASSISTED LAPAROSCOPIC RADICAL PROSTATECTOMY N/A 07/23/2016   Procedure: XI ROBOTIC ASSISTED LAPAROSCOPIC RADICAL PROSTATECTOMY LEVEL 1;  Surgeon: Heloise Purpura, MD;  Location: [facility] ORS;  Service: Urology;  Laterality: N/A;   SHOULDER ARTHROSCOPY WITH ROTATOR CUFF REPAIR AND SUBACROMIAL DECOMPRESSION Right 05/27/2023   Procedure: SHOULDER ARTHROSCOPY WITH ROTATOR CUFF REPAIR AND SUBACROMIAL DECOMPRESSION;  Surgeon: Jones Broom, MD;  Location: [facility] ORS;  Service: Orthopedics;  Laterality: Right;  NEEDS 90 MINUTES   TEE WITHOUT CARDIOVERSION N/A 09/07/2022   Procedure: TRANSESOPHAGEAL ECHOCARDIOGRAM (TEE);  Surgeon: Alleen Borne, MD;  Location: [facility] OR;  Service: Open Heart Surgery;  Laterality: N/A;   THYROIDECTOMY, PARTIAL     [facility])   Patient Active Problem List   Diagnosis Date Noted   Anxiety and depression 02/21/2023   Pancytopenia (HCC) 02/21/2023   Elevated liver enzymes 02/21/2023   Alcohol use disorder, severe 02/16/2023   S/P ascending aortic aneurysm repair 09/07/2022   Aneurysm of ascending aorta without rupture (HCC) 07/20/2022   Mild persistent asthma without complication 01/24/2020   Prostate cancer (HCC) 01/08/2016   Varicose veins of bilateral lower extremities with other complications 03/01/2013   Former smokeless tobacco use 11/25/2012   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HCC)    GERD (gastroesophageal reflux disease)    Allergic rhinitis due  to pollen    Hypertension    Hyperlipidemia    PTSD (post-traumatic stress disorder)     PCP: Hannah Beat, MD  REFERRING PROVIDER: Berline Lopes  REFERRING DIAG: RIGHT SHOULDER ROTATOR CUFF TEAR   THERAPY DIAG:  Acute pain of right shoulder  Muscle weakness (generalized)  Rationale for Evaluation and Treatment: Rehabilitation  ONSET DATE: 05/27/23  SUBJECTIVE:                                                                                                                                                                                       SUBJECTIVE STATEMENT: Pt reports being sick over the last few days. Felt sore after last session. Ready to progress his strength training.  Hand dominance: Right  PERTINENT HISTORY: Pt is a pleasant 63 y.o. male referred to OPPT s/p R RTC tear repair and subacromial decompression 05/27/23. Pt reports having a fall at work in the [employer] onto his R shoulder. Happened December 5th, 2023. Waited for his RTC surgery as he had open heart surgery for ascending aortic aneurysm in February 2024. Pain is minimal, controlled via Tylenol. R handed. Pt works in [employer] lab at [employer] requiring BUE use such as mobilizing patients, transfers, and use of equipment for performing catheterizations.   PAIN:  Are you having pain? None on arrival; noted soreness during end-range stretches and soreness at end of session.   PRECAUTIONS: Other: RTC repair protocol  Weeks Six-Twelve Precautions: -no lifting -no supporting BW with hands and arms -no sudden jerking -no excessive behind the back movements -avoid upper extremity ergometer    RED FLAGS: None   WEIGHT BEARING RESTRICTIONS: No  FALLS:  Has patient fallen in last 6 months? No  LIVING ENVIRONMENT: Lives with: lives with their spouse Lives in: House/apartment  OCCUPATION: Cardiovascular Specialist  PLOF: Independent  PATIENT GOALS:Return to occupation, return to PLOF.  NEXT MD VISIT: N/A  OBJECTIVE:  Note: Objective measures were completed at Evaluation unless otherwise noted.  DIAGNOSTIC FINDINGS:  N/A  PATIENT SURVEYS:  FOTO 47/68  COGNITION: Overall cognitive status: Within functional limits for tasks assessed     SENSATION: WFL  POSTURE: Mildly rounded shoulders  CERVICAL AROM: Flexion: 70 Extension: 55 Rotation R/L: 60/70 Lateral flexion R/L: 45/45  UPPER EXTREMITY ROM:   Active ROM unless otherwise noted Right Eval PROM 11/26 Left eval   Shoulder flexion 81 165  Shoulder extension  65  Shoulder abduction  149  Shoulder adduction    Shoulder internal rotation 55 90  Shoulder external rotation 20 65  Elbow flexion WNL WNL  Elbow extension 0 WNL  Wrist flexion WNL WNL  Wrist extension WNL WNL  Wrist ulnar deviation    Wrist radial deviation    Wrist pronation WNL WNL  Wrist supination WNL WNL  (Blank rows = not tested)  UPPER EXTREMITY MMT:  MMT Right Eval Left Eval  Shoulder flexion deferred 5  Shoulder extension deferred   Shoulder abduction deferred 5  Shoulder adduction    Shoulder internal rotation deferred 5  Shoulder external rotation deferred 5  Middle trapezius    Lower trapezius    Elbow flexion  5  Elbow extension  4 (seated)   TODAY'S TREATMENT:                                                                                                                                         DATE: 09/23/23  There.ex:   Lat pull down at [facility]: 3x15, 45#   Seated Scap retractions at [facility]: 2x15, 35#  Seated chest press at [facility]: 3x15, 35#. Min multi modal cuing for form/technique.   Standing B shoulder horizontal abduction with black TB: 3x12     R shoulder flexion AROM post session: 150  R shoulder abduction AROM post session: 140    There.act:  RUE D2 flexion with GTB: 2x8, 1x8 with Blue TB Standing RUE shoulder ER in 90 deg abduction: 3x8, RTB with light TC's on elbows  Standing RUE shoulder flexed at end range, 3KG med ball CW/CCW rotations: 2x30 sec/direction   PATIENT EDUCATION: Education details: Updated precautions/restrictions/HEP per phase 2 protocol Person educated: Patient Education method: Explanation Education comprehension: verbalized understanding, returned demonstration, verbal cues required, and tactile cues required  HOME EXERCISE PROGRAM: Access Code: [email or web address] URL: [email or web address] Date: 08/31/2023 Prepared by: Ronnie Derby  Exercises - Seated  Shoulder Abduction with Bent Elbow  - 1 x daily - 3-4 x weekly - 2 sets - 12 reps - Prone Shoulder Row  - 3 x daily - 1 sets - 10 reps - Supine Shoulder Flexion Extension AAROM with Dowel  - 1 x daily - 4-5 x weekly - 3 sets - 12 reps - Prone Shoulder Row  - 1 x daily - 3 x weekly - 2 sets - 12 reps - Prone Shoulder Extension - Single Arm  - 1 x daily - 3 x weekly - 2 sets - 12 reps - Shoulder Internal Rotation with Resistance  - 1 x daily - 2-3 x weekly - 3 sets - 12 reps - Standing Row with Resistance with Anchored Resistance at Chest Height Palms Down  - 1 x daily - 2-3 x weekly - 3 sets - 8 reps - Single Arm Scaption with Resistance  - 1 x daily - 3-4 x weekly - 3 sets - 8 reps - Standing Shoulder Horizontal Abduction with Resistance  - 1 x daily - 3-4 x weekly - 3 sets - 8 reps - Shoulder External Rotation with Anchored Resistance  - 1 x daily -  3-4 x weekly - 3 sets - 8 reps   Access Code: [email or web address] URL: [email or web address] Date: 08/17/2023 Prepared by: Ronnie Derby  Exercises - Seated Single Arm Shoulder Scaption  - 1 x daily - 3-4 x weekly - 2 sets - 12 reps - Seated Shoulder External Rotation  - 1 x daily - 3-4 x weekly - 2 sets - 12 reps - Seated Shoulder Abduction with Bent Elbow  - 1 x daily - 3-4 x weekly - 2 sets - 12 reps - Prone Shoulder Row  - 3 x daily - 1 sets - 10 reps - Supine Shoulder Flexion Extension AAROM with Dowel  - 1 x daily - 4-5 x weekly - 3 sets - 12 reps - Sidelying Shoulder External Rotation  - 1 x daily - 3 x weekly - 2 sets - 12 reps - Prone Shoulder Row  - 1 x daily - 3 x weekly - 2 sets - 12 reps - Prone Shoulder Extension - Single Arm  - 1 x daily - 3 x weekly - 2 sets - 12 reps - Shoulder Internal Rotation with Resistance  - 1 x daily - 2-3 x weekly - 3 sets - 12 reps - Shoulder External Rotation and Scapular Retraction with Resistance  - 1 x daily - 2-3 x weekly - 3 sets - 12 reps - Standing Row with Resistance with Anchored  Resistance at Chest Height Palms Down  - 1 x daily - 2-3 x weekly - 3 sets - 8 reps   Access Code: [email or web address] URL: [email or web address] Date: 08/11/2023 Prepared by: Ronnie Derby  Exercises - Seated Single Arm Shoulder Scaption  - 1 x daily - 3-4 x weekly - 2 sets - 12 reps - Seated Shoulder External Rotation  - 1 x daily - 3-4 x weekly - 2 sets - 12 reps - Seated Shoulder Abduction with Bent Elbow  - 1 x daily - 3-4 x weekly - 2 sets - 12 reps - Prone Shoulder Row  - 3 x daily - 1 sets - 10 reps - Supine Shoulder Flexion Extension AAROM with Dowel  - 1 x daily - 4-5 x weekly - 3 sets - 12 reps - Sidelying Shoulder External Rotation  - 1 x daily - 3 x weekly - 2 sets - 12 reps - Prone Shoulder Row  - 1 x daily - 3 x weekly - 2 sets - 12 reps - Prone Shoulder Extension - Single Arm  - 1 x daily - 3 x weekly - 2 sets - 12 reps  Access Code: [email or web address] URL: [email or web address] Date: 08/05/2023 Prepared by: Ronnie Derby  Exercises - Prone Shoulder Row  - 3 x daily - 1 sets - 10 reps - Isometric Shoulder Adduction  - 3 x daily - 1 sets - 10 reps - 3 sec hold - Isometric Shoulder Abduction at Wall  - 3 x daily - 1 sets - 10 reps - 3 sec hold - Standing Isometric Shoulder External Rotation with Doorway  - 3 x daily - 1 sets - 10 reps - 3 sec hold - Standing Isometric Shoulder Flexion with Doorway - Arm Bent  - 3 x daily - 1 sets - 10 reps - 3 sec hold - Standing Isometric Shoulder Internal Rotation at Doorway  - 3 x daily - 1 sets - 10 reps - 3 sec hold - Supine Shoulder Flexion Extension AAROM with Dowel  - 1 x daily - 4-5 x weekly - 3 sets - 12 reps -  Sidelying Shoulder External Rotation  - 1 x daily - 3 x weekly - 2 sets - 12 reps - Prone Shoulder Row  - 1 x daily - 3 x weekly - 2 sets - 12 reps - Prone Shoulder Extension - Single Arm  - 1 x daily - 3 x weekly - 2 sets - 12 reps - Prone Shoulder Horizontal Abduction  - 1 x daily - 3 x weekly - 2 sets - 12  reps - Supine Shoulder Flexion Extension Full Range AROM  - 1 x daily - 7 x weekly - 2 sets - 15 reps - Supine Shoulder Abduction AROM  - 1 x daily - 7 x weekly - 2 sets - 15 reps   Access Code: [email or web address] URL: [email or web address] Date: 07/09/2023 Prepared by: Alvera Novel  Exercises - Seated Scapular Retraction  - 3 x daily - 1 sets - 15 reps - Seated Shoulder Shrugs  - 3 x daily - 1 sets - 15 reps - Seated Shoulder Flexion Towel Slide at Table Top  - 3 x daily - 1 sets - 15-20 reps - Seated Shoulder Abduction Towel Slide at Table Top  - 1 x daily - 1 sets - 15-20 reps - Prone Shoulder Row  - 3 x daily - 1 sets - 10 reps - Isometric Shoulder Adduction  - 3 x daily - 1 sets - 10 reps - 3 sec hold - Isometric Shoulder Abduction at Wall  - 3 x daily - 1 sets - 10 reps - 3 sec hold - Standing Isometric Shoulder External Rotation with Doorway  - 3 x daily - 1 sets - 10 reps - 3 sec hold - Standing Isometric Shoulder Flexion with Doorway - Arm Bent  - 3 x daily - 1 sets - 10 reps - 3 sec hold - Standing Isometric Shoulder Internal Rotation at Doorway  - 3 x daily - 1 sets - 10 reps - 3 sec hold  ASSESSMENT:  CLINICAL IMPRESSION: Continuing PT POC with focus on end range flexion and abduction ROM and progressive shoulder strengthening. Pt making steady progress with increased resistance levels without onset of R shoulder pain. Still addressing RTC stability in overhead positions and abducted ranges as well. Will update HEP to address current stage of protocol. Pt will continue to benefit from skilled Pt services to progress RUE strength and AROM to return to PLOF.   OBJECTIVE IMPAIRMENTS: decreased mobility, decreased ROM, decreased strength, impaired UE functional use, postural dysfunction, and pain.   ACTIVITY LIMITATIONS: carrying, lifting, bathing, toileting, dressing, reach over head, and hygiene/grooming  PARTICIPATION LIMITATIONS: cleaning, community activity,  occupation, and yard work  PERSONAL FACTORS: Age, Education, Fitness, Past/current experiences, Profession, and Time since onset of injury/illness/exacerbation are also affecting patient's functional outcome.   REHAB POTENTIAL: Good  CLINICAL DECISION MAKING: Stable/uncomplicated  EVALUATION COMPLEXITY: Low   GOALS: Goals reviewed with patient? No  SHORT TERM GOALS: Target date: 07/06/23 unless otherwise posted below  Pt will be independent with HEP to improve R shoulder AROM and strength within protocol parameters to improve functional mobility.  Baseline: 06/08/23: Provided initial HEP; 07/02/23: Independent with HEP progressing PROM.   Goal status: MET  2.  Pt will demonstrate R shoulder AROM in flexion to at least 120 degrees in supine for gradual return to overhead ADL completion. Baseline: 06/08/23: Phase 1 of current protocol (PROM); 08/05/23: 125 degrees Goal status: MET Target Date: 07/21/22 (8 weeks post op)  LONG TERM GOALS: Target date: 10/26/23 unless otherwise  posted below  Pt will improve FOTO to target score to demonstrate clinically significant improvement in functional mobility. Baseline: 06/08/23: 47/68; 08/17/23: 29/52; 08/31/23: 64/68 Goal status: ON GOING  2.  Pt will demonstrate full, R shoulder AROM in all planes equal to LUE to demonstrate return to full capacity to complete ADL's.  Baseline: 06/08/23  Active ROM unless otherwise noted Right Eval PROM 11/26 Left eval Right  AROM Seated 08/17/23 Right  AROM Seated 08/31/23  Shoulder flexion 81 165 125 140  Shoulder extension  65    Shoulder abduction  149 91 115  Shoulder adduction      Shoulder internal rotation 55 90    Shoulder external rotation 20 65 60 60  Elbow flexion WNL WNL    Elbow extension 0 WNL    Wrist flexion WNL WNL    Wrist extension WNL WNL    Wrist ulnar deviation      Wrist radial deviation      Wrist pronation WNL WNL    Wrist supination WNL WNL    (Blank rows = not  tested) Goal status: INITIAL  3.  Pt will be independent with updated HEP program including periscapular and RTC strength/stability related exercises to begin return to ability to complete work related and recreational related activity.   Baseline: 06/08/23: ; 08/17/23: independent with progressive protocol Goal status: MET  4.  Pt will report return to full work related tasks without strength or other reported functional deficits to demonstrate return to PLOF.  Baseline:  Goal status: INITIAL Target date: 11/28/22 (16 weeks post-op) PLAN:  PT FREQUENCY: 1-2x/week  PT DURATION: 8 weeks  PLANNED INTERVENTIONS: 97164- PT Re-evaluation, 97110-Therapeutic exercises, 97530- Therapeutic activity, 97112- Neuromuscular re-education, 97535- Self Care, 84132- Manual therapy, 97014- Electrical stimulation (unattended), [id number]- Electrical stimulation (manual), Patient/Family education, Dry Needling, Joint mobilization, Spinal manipulation, Spinal mobilization, Cryotherapy, Moist heat, and Biofeedback  PLAN FOR NEXT SESSION: week 16 of protocol. End range flexion and abduction with scapulohumeral rhythm. Global R shoulder strengthening. Thoracic rotation mimicking golf swing.  Delphia Grates. Fairly IV, PT, DPT Physical Therapist- [place]  [facility]  8:14 AM, 09/23/23

## 2023-09-27 ENCOUNTER — Ambulatory Visit: Payer: PRIVATE HEALTH INSURANCE

## 2023-09-27 DIAGNOSIS — M25511 Pain in right shoulder: Secondary | ICD-10-CM | POA: Diagnosis not present

## 2023-09-27 DIAGNOSIS — M6281 Muscle weakness (generalized): Secondary | ICD-10-CM

## 2023-09-27 NOTE — Therapy (Signed)
 OUTPATIENT PHYSICAL THERAPY SHOULDER TREATMENT  Patient Name: Austin Houston MRN: [id number] DOB:[birth year], 63 y.o., male Today's Date: 09/27/2023  END OF SESSION:  PT End of Session - 09/27/23 1601     Visit Number 18    Number of Visits 25    Date for PT Re-Evaluation 10/26/23    Authorization Type [facility]    Progress Note Due on Visit 10    PT Start Time 1601    PT Stop Time 1645    PT Time Calculation (min) 44 min    Activity Tolerance Patient tolerated treatment well    Behavior During Therapy WFL for tasks assessed/performed             Past Medical History:  Diagnosis Date   Actinic keratosis    Allergic rhinitis due to pollen    Asthma    vs COPD- taking inhalers- as related to allergies only.   Basal cell carcinoma 03/26/2020   R lat deltoid - ED&C    Dysplastic nevus 03/07/2020   L costal infrapectoral - moderate   Dysplastic nevus 03/07/2020   R post flank above waistline - moderate   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HCC)    s/p partial thyroidectomy (follicular adenoma)-surgery only   GERD (gastroesophageal reflux disease)    Hearing impaired person, bilateral    hearing aida bilateral"high frequency loss   Hyperlipidemia    Hypertension    Prostate cancer (HCC) 01/08/2016   PTSD (post-traumatic stress disorder)    s/p multiple active deployments for [employer], no medications taken currently   Past Surgical History:  Procedure Laterality Date   COLONOSCOPY W/ POLYPECTOMY     age 59 "adenoma"   INGUINAL HERNIA REPAIR     right   LEFT HEART CATH AND CORONARY ANGIOGRAPHY N/A 07/20/2022   Procedure: LEFT HEART CATH AND CORONARY ANGIOGRAPHY;  Surgeon: Tonny Bollman, MD;  Location: [facility] INVASIVE CV LAB;  Service: Cardiovascular;  Laterality: N/A;   REPLACEMENT ASCENDING AORTA N/A 09/07/2022   Procedure: REPLACEMENT ASCENDING AORTA;  Surgeon: Alleen Borne, MD;  Location: [facility] OR;  Service: Open Heart Surgery;  Laterality: N/A;   WITH CIRC ARREST   ROBOT ASSISTED LAPAROSCOPIC RADICAL PROSTATECTOMY N/A 07/23/2016   Procedure: XI ROBOTIC ASSISTED LAPAROSCOPIC RADICAL PROSTATECTOMY LEVEL 1;  Surgeon: Heloise Purpura, MD;  Location: [facility] ORS;  Service: Urology;  Laterality: N/A;   SHOULDER ARTHROSCOPY WITH ROTATOR CUFF REPAIR AND SUBACROMIAL DECOMPRESSION Right 05/27/2023   Procedure: SHOULDER ARTHROSCOPY WITH ROTATOR CUFF REPAIR AND SUBACROMIAL DECOMPRESSION;  Surgeon: Jones Broom, MD;  Location: [facility] ORS;  Service: Orthopedics;  Laterality: Right;  NEEDS 90 MINUTES   TEE WITHOUT CARDIOVERSION N/A 09/07/2022   Procedure: TRANSESOPHAGEAL ECHOCARDIOGRAM (TEE);  Surgeon: Alleen Borne, MD;  Location: [facility] OR;  Service: Open Heart Surgery;  Laterality: N/A;   THYROIDECTOMY, PARTIAL     [name]   Patient Active Problem List   Diagnosis Date Noted   Anxiety and depression 02/21/2023   Pancytopenia (HCC) 02/21/2023   Elevated liver enzymes 02/21/2023   Alcohol use disorder, severe 02/16/2023   S/P ascending aortic aneurysm repair 09/07/2022   Aneurysm of ascending aorta without rupture (HCC) 07/20/2022   Mild persistent asthma without complication 01/24/2020   Prostate cancer (HCC) 01/08/2016   Varicose veins of bilateral lower extremities with other complications 03/01/2013   Former smokeless tobacco use 11/25/2012   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HCC)    GERD (gastroesophageal reflux disease)    Allergic rhinitis due  to pollen    Hypertension    Hyperlipidemia    PTSD (post-traumatic stress disorder)     PCP: Hannah Beat, MD  REFERRING PROVIDER: Berline Lopes  REFERRING DIAG: RIGHT SHOULDER ROTATOR CUFF TEAR   THERAPY DIAG:  Acute pain of right shoulder  Muscle weakness (generalized)  Rationale for Evaluation and Treatment: Rehabilitation  ONSET DATE: 05/27/23  SUBJECTIVE:                                                                                                                                                                                       SUBJECTIVE STATEMENT: Pt reports his shoulder is doing well. Had f/u with orthopedic MD and all is well.  Hand dominance: Right  PERTINENT HISTORY: Pt is a pleasant 63 y.o. male referred to OPPT s/p R RTC tear repair and subacromial decompression 05/27/23. Pt reports having a fall at work in the [employer] onto his R shoulder. Happened December 5th, 2023. Waited for his RTC surgery as he had open heart surgery for ascending aortic aneurysm in February 2024. Pain is minimal, controlled via Tylenol. R handed. Pt works in [employer] lab at [employer] requiring BUE use such as mobilizing patients, transfers, and use of equipment for performing catheterizations.   PAIN:  Are you having pain? None on arrival; noted soreness during end-range stretches and soreness at end of session.   PRECAUTIONS: Other: RTC repair protocol  Weeks Six-Twelve Precautions: -no lifting -no supporting BW with hands and arms -no sudden jerking -no excessive behind the back movements -avoid upper extremity ergometer    RED FLAGS: None   WEIGHT BEARING RESTRICTIONS: No  FALLS:  Has patient fallen in last 6 months? No  LIVING ENVIRONMENT: Lives with: lives with their spouse Lives in: House/apartment  OCCUPATION: Cardiovascular Specialist  PLOF: Independent  PATIENT GOALS:Return to occupation, return to PLOF.  NEXT MD VISIT: N/A  OBJECTIVE:  Note: Objective measures were completed at Evaluation unless otherwise noted.  DIAGNOSTIC FINDINGS:  N/A  PATIENT SURVEYS:  FOTO 47/68  COGNITION: Overall cognitive status: Within functional limits for tasks assessed     SENSATION: WFL  POSTURE: Mildly rounded shoulders  CERVICAL AROM: Flexion: 70 Extension: 55 Rotation R/L: 60/70 Lateral flexion R/L: 45/45  UPPER EXTREMITY ROM:   Active ROM unless otherwise noted Right Eval PROM 11/26 Left eval  Shoulder flexion 81 165   Shoulder extension  65  Shoulder abduction  149  Shoulder adduction    Shoulder internal rotation 55 90  Shoulder external rotation 20 65  Elbow flexion WNL WNL  Elbow extension 0 WNL  Wrist flexion WNL WNL  Wrist extension WNL  WNL  Wrist ulnar deviation    Wrist radial deviation    Wrist pronation WNL WNL  Wrist supination WNL WNL  (Blank rows = not tested)  UPPER EXTREMITY MMT:  MMT Right Eval Left Eval  Shoulder flexion deferred 5  Shoulder extension deferred   Shoulder abduction deferred 5  Shoulder adduction    Shoulder internal rotation deferred 5  Shoulder external rotation deferred 5  Middle trapezius    Lower trapezius    Elbow flexion  5  Elbow extension  4 (seated)   TODAY'S TREATMENT:                                                                                                                                         DATE: 09/27/23  There.ex:  Standing R shoulder wall angel: x8   Seated R shoulder Arnold presses: 3# DB, 1x8, 4# DB 1x10, 1x8   Lat pull down at OMEGA: 3x15, 55#   Seated Scap retractions at OMEGA: 2x15, 55#    There.act:  Standing R shoulder 3 KG med ball bounces at end range flexion for RTC stability: 1 x 3 minutes    R shoulder abduction CW/CCW rotations at 115 degrees: 1 minute CW/ 1 minute CCW  Standing RUE shoulder ER in 90 deg abduction: 3x8, GTB with light TC's on elbows   PATIENT EDUCATION: Education details: Updated precautions/restrictions/HEP per phase 2 protocol Person educated: Patient Education method: Explanation Education comprehension: verbalized understanding, returned demonstration, verbal cues required, and tactile cues required  HOME EXERCISE PROGRAM: Access Code: [email or web address] URL: [email or web address] Date: 08/31/2023 Prepared by: Ronnie Derby  Exercises - Seated Shoulder Abduction with Bent Elbow  - 1 x daily - 3-4 x weekly - 2 sets - 12 reps - Prone Shoulder Row  - 3 x daily - 1 sets - 10  reps - Supine Shoulder Flexion Extension AAROM with Dowel  - 1 x daily - 4-5 x weekly - 3 sets - 12 reps - Prone Shoulder Row  - 1 x daily - 3 x weekly - 2 sets - 12 reps - Prone Shoulder Extension - Single Arm  - 1 x daily - 3 x weekly - 2 sets - 12 reps - Shoulder Internal Rotation with Resistance  - 1 x daily - 2-3 x weekly - 3 sets - 12 reps - Standing Row with Resistance with Anchored Resistance at Chest Height Palms Down  - 1 x daily - 2-3 x weekly - 3 sets - 8 reps - Single Arm Scaption with Resistance  - 1 x daily - 3-4 x weekly - 3 sets - 8 reps - Standing Shoulder Horizontal Abduction with Resistance  - 1 x daily - 3-4 x weekly - 3 sets - 8 reps - Shoulder External Rotation with Anchored Resistance  - 1 x daily - 3-4 x weekly - 3 sets - 8 reps   Access Code:  [email or web address] URL: [email or web address] Date: 08/17/2023 Prepared by: Ronnie Derby  Exercises - Seated Single Arm Shoulder Scaption  - 1 x daily - 3-4 x weekly - 2 sets - 12 reps - Seated Shoulder External Rotation  - 1 x daily - 3-4 x weekly - 2 sets - 12 reps - Seated Shoulder Abduction with Bent Elbow  - 1 x daily - 3-4 x weekly - 2 sets - 12 reps - Prone Shoulder Row  - 3 x daily - 1 sets - 10 reps - Supine Shoulder Flexion Extension AAROM with Dowel  - 1 x daily - 4-5 x weekly - 3 sets - 12 reps - Sidelying Shoulder External Rotation  - 1 x daily - 3 x weekly - 2 sets - 12 reps - Prone Shoulder Row  - 1 x daily - 3 x weekly - 2 sets - 12 reps - Prone Shoulder Extension - Single Arm  - 1 x daily - 3 x weekly - 2 sets - 12 reps - Shoulder Internal Rotation with Resistance  - 1 x daily - 2-3 x weekly - 3 sets - 12 reps - Shoulder External Rotation and Scapular Retraction with Resistance  - 1 x daily - 2-3 x weekly - 3 sets - 12 reps - Standing Row with Resistance with Anchored Resistance at Chest Height Palms Down  - 1 x daily - 2-3 x weekly - 3 sets - 8 reps   Access Code: [email or web address] URL:  [email or web address] Date: 08/11/2023 Prepared by: Ronnie Derby  Exercises - Seated Single Arm Shoulder Scaption  - 1 x daily - 3-4 x weekly - 2 sets - 12 reps - Seated Shoulder External Rotation  - 1 x daily - 3-4 x weekly - 2 sets - 12 reps - Seated Shoulder Abduction with Bent Elbow  - 1 x daily - 3-4 x weekly - 2 sets - 12 reps - Prone Shoulder Row  - 3 x daily - 1 sets - 10 reps - Supine Shoulder Flexion Extension AAROM with Dowel  - 1 x daily - 4-5 x weekly - 3 sets - 12 reps - Sidelying Shoulder External Rotation  - 1 x daily - 3 x weekly - 2 sets - 12 reps - Prone Shoulder Row  - 1 x daily - 3 x weekly - 2 sets - 12 reps - Prone Shoulder Extension - Single Arm  - 1 x daily - 3 x weekly - 2 sets - 12 reps  Access Code: [email or web address] URL: [email or web address] Date: 08/05/2023 Prepared by: Ronnie Derby  Exercises - Prone Shoulder Row  - 3 x daily - 1 sets - 10 reps - Isometric Shoulder Adduction  - 3 x daily - 1 sets - 10 reps - 3 sec hold - Isometric Shoulder Abduction at Wall  - 3 x daily - 1 sets - 10 reps - 3 sec hold - Standing Isometric Shoulder External Rotation with Doorway  - 3 x daily - 1 sets - 10 reps - 3 sec hold - Standing Isometric Shoulder Flexion with Doorway - Arm Bent  - 3 x daily - 1 sets - 10 reps - 3 sec hold - Standing Isometric Shoulder Internal Rotation at Doorway  - 3 x daily - 1 sets - 10 reps - 3 sec hold - Supine Shoulder Flexion Extension AAROM with Dowel  - 1 x daily - 4-5 x weekly - 3 sets - 12 reps - Sidelying Shoulder External Rotation  - 1 x daily - 3 x  weekly - 2 sets - 12 reps - Prone Shoulder Row  - 1 x daily - 3 x weekly - 2 sets - 12 reps - Prone Shoulder Extension - Single Arm  - 1 x daily - 3 x weekly - 2 sets - 12 reps - Prone Shoulder Horizontal Abduction  - 1 x daily - 3 x weekly - 2 sets - 12 reps - Supine Shoulder Flexion Extension Full Range AROM  - 1 x daily - 7 x weekly - 2 sets - 15 reps - Supine  Shoulder Abduction AROM  - 1 x daily - 7 x weekly - 2 sets - 15 reps   Access Code: [email or web address] URL: [email or web address] Date: 07/09/2023 Prepared by: Alvera Novel  Exercises - Seated Scapular Retraction  - 3 x daily - 1 sets - 15 reps - Seated Shoulder Shrugs  - 3 x daily - 1 sets - 15 reps - Seated Shoulder Flexion Towel Slide at Table Top  - 3 x daily - 1 sets - 15-20 reps - Seated Shoulder Abduction Towel Slide at Table Top  - 1 x daily - 1 sets - 15-20 reps - Prone Shoulder Row  - 3 x daily - 1 sets - 10 reps - Isometric Shoulder Adduction  - 3 x daily - 1 sets - 10 reps - 3 sec hold - Isometric Shoulder Abduction at Wall  - 3 x daily - 1 sets - 10 reps - 3 sec hold - Standing Isometric Shoulder External Rotation with Doorway  - 3 x daily - 1 sets - 10 reps - 3 sec hold - Standing Isometric Shoulder Flexion with Doorway - Arm Bent  - 3 x daily - 1 sets - 10 reps - 3 sec hold - Standing Isometric Shoulder Internal Rotation at Doorway  - 3 x daily - 1 sets - 10 reps - 3 sec hold  ASSESSMENT:  CLINICAL IMPRESSION: Continuing PT POC with focus on end range flexion and abduction ROM and progressive shoulder strengthening. Pt making steady progress with increased resistance levels without onset of R shoulder pain. Pt continues to display the most limitations with shoulder abduction with pain at end ranges. PT to continue to progress shoulder strength as able per protocol.  Pt will continue to benefit from skilled Pt services to progress RUE strength and AROM to return to PLOF.   OBJECTIVE IMPAIRMENTS: decreased mobility, decreased ROM, decreased strength, impaired UE functional use, postural dysfunction, and pain.   ACTIVITY LIMITATIONS: carrying, lifting, bathing, toileting, dressing, reach over head, and hygiene/grooming  PARTICIPATION LIMITATIONS: cleaning, community activity, occupation, and yard work  PERSONAL FACTORS: Age, Education, Fitness, Past/current  experiences, Profession, and Time since onset of injury/illness/exacerbation are also affecting patient's functional outcome.   REHAB POTENTIAL: Good  CLINICAL DECISION MAKING: Stable/uncomplicated  EVALUATION COMPLEXITY: Low   GOALS: Goals reviewed with patient? No  SHORT TERM GOALS: Target date: 07/06/23 unless otherwise posted below  Pt will be independent with HEP to improve R shoulder AROM and strength within protocol parameters to improve functional mobility.  Baseline: 06/08/23: Provided initial HEP; 07/02/23: Independent with HEP progressing PROM.   Goal status: MET  2.  Pt will demonstrate R shoulder AROM in flexion to at least 120 degrees in supine for gradual return to overhead ADL completion. Baseline: 06/08/23: Phase 1 of current protocol (PROM); 08/05/23: 125 degrees Goal status: MET Target Date: 07/21/22 (8 weeks post op)  LONG TERM GOALS: Target date: 10/26/23 unless otherwise posted below  Pt will improve  FOTO to target score to demonstrate clinically significant improvement in functional mobility. Baseline: 06/08/23: 47/68; 08/17/23: 29/52; 08/31/23: 64/68 Goal status: ON GOING  2.  Pt will demonstrate full, R shoulder AROM in all planes equal to LUE to demonstrate return to full capacity to complete ADL's.  Baseline: 06/08/23  Active ROM unless otherwise noted Right Eval PROM 11/26 Left eval Right  AROM Seated 08/17/23 Right  AROM Seated 08/31/23  Shoulder flexion 81 165 125 140  Shoulder extension  65    Shoulder abduction  149 91 115  Shoulder adduction      Shoulder internal rotation 55 90    Shoulder external rotation 20 65 60 60  Elbow flexion WNL WNL    Elbow extension 0 WNL    Wrist flexion WNL WNL    Wrist extension WNL WNL    Wrist ulnar deviation      Wrist radial deviation      Wrist pronation WNL WNL    Wrist supination WNL WNL    (Blank rows = not tested) Goal status: INITIAL  3.  Pt will be independent with updated HEP program including  periscapular and RTC strength/stability related exercises to begin return to ability to complete work related and recreational related activity.   Baseline: 06/08/23: ; 08/17/23: independent with progressive protocol Goal status: MET  4.  Pt will report return to full work related tasks without strength or other reported functional deficits to demonstrate return to PLOF.  Baseline:  Goal status: INITIAL Target date: 11/28/22 (16 weeks post-op) PLAN:  PT FREQUENCY: 1-2x/week  PT DURATION: 8 weeks  PLANNED INTERVENTIONS: 97164- PT Re-evaluation, 97110-Therapeutic exercises, 97530- Therapeutic activity, 97112- Neuromuscular re-education, 97535- Self Care, 84132- Manual therapy, 97014- Electrical stimulation (unattended), [phone]- Electrical stimulation (manual), Patient/Family education, Dry Needling, Joint mobilization, Spinal manipulation, Spinal mobilization, Cryotherapy, Moist heat, and Biofeedback  PLAN FOR NEXT SESSION: week 16 of protocol. End range flexion and abduction with scapulohumeral rhythm. Global R shoulder strengthening. Thoracic rotation mimicking golf swing.  Delphia Grates. Fairly IV, PT, DPT Physical Therapist- [place]  [facility]  4:53 PM, 09/27/23

## 2023-09-28 ENCOUNTER — Other Ambulatory Visit: Payer: Self-pay

## 2023-09-28 DIAGNOSIS — N5201 Erectile dysfunction due to arterial insufficiency: Secondary | ICD-10-CM | POA: Diagnosis not present

## 2023-09-28 DIAGNOSIS — C61 Malignant neoplasm of prostate: Secondary | ICD-10-CM | POA: Diagnosis not present

## 2023-09-28 MED ORDER — TADALAFIL 5 MG PO TABS
5.0000 mg | ORAL_TABLET | Freq: Every day | ORAL | 3 refills | Status: AC
  Filled 2023-09-28 – 2023-11-11 (×2): qty 90, 90d supply, fill #0

## 2023-09-30 ENCOUNTER — Ambulatory Visit: Payer: Worker's Compensation | Attending: Orthopedic Surgery

## 2023-09-30 DIAGNOSIS — M25511 Pain in right shoulder: Secondary | ICD-10-CM | POA: Diagnosis present

## 2023-09-30 DIAGNOSIS — M6281 Muscle weakness (generalized): Secondary | ICD-10-CM | POA: Diagnosis present

## 2023-09-30 NOTE — Therapy (Signed)
 OUTPATIENT PHYSICAL THERAPY SHOULDER TREATMENT  Patient Name: Austin Houston MRN: [id number] DOB:1961/01/21, 63 y.o., male Today's Date: 09/30/2023  END OF SESSION:  PT End of Session - 09/30/23 1559     Visit Number 19    Number of Visits 25    Date for PT Re-Evaluation 10/26/23    Authorization Type [facility]    Progress Note Due on Visit 10    PT Start Time 1600    PT Stop Time 1644    PT Time Calculation (min) 44 min    Activity Tolerance Patient tolerated treatment well    Behavior During Therapy WFL for tasks assessed/performed             Past Medical History:  Diagnosis Date   Actinic keratosis    Allergic rhinitis due to pollen    Asthma    vs COPD- taking inhalers- as related to allergies only.   Basal cell carcinoma 03/26/2020   R lat deltoid - ED&C    Dysplastic nevus 03/07/2020   L costal infrapectoral - moderate   Dysplastic nevus 03/07/2020   R post flank above waistline - moderate   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HCC)    s/p partial thyroidectomy (follicular adenoma)-surgery only   GERD (gastroesophageal reflux disease)    Hearing impaired person, bilateral    hearing aida bilateral"high frequency loss   Hyperlipidemia    Hypertension    Prostate cancer (HCC) 01/08/2016   PTSD (post-traumatic stress disorder)    s/p multiple active deployments for [employer], no medications taken currently   Past Surgical History:  Procedure Laterality Date   COLONOSCOPY W/ POLYPECTOMY     age 68 "adenoma"   INGUINAL HERNIA REPAIR     right   LEFT HEART CATH AND CORONARY ANGIOGRAPHY N/A 07/20/2022   Procedure: LEFT HEART CATH AND CORONARY ANGIOGRAPHY;  Surgeon: Tonny Bollman, MD;  Location: [facility] INVASIVE CV LAB;  Service: Cardiovascular;  Laterality: N/A;   REPLACEMENT ASCENDING AORTA N/A 09/07/2022   Procedure: REPLACEMENT ASCENDING AORTA;  Surgeon: Alleen Borne, MD;  Location: [facility] OR;  Service: Open Heart Surgery;  Laterality: N/A;   WITH CIRC ARREST   ROBOT ASSISTED LAPAROSCOPIC RADICAL PROSTATECTOMY N/A 07/23/2016   Procedure: XI ROBOTIC ASSISTED LAPAROSCOPIC RADICAL PROSTATECTOMY LEVEL 1;  Surgeon: Heloise Purpura, MD;  Location: [facility] ORS;  Service: Urology;  Laterality: N/A;   SHOULDER ARTHROSCOPY WITH ROTATOR CUFF REPAIR AND SUBACROMIAL DECOMPRESSION Right 05/27/2023   Procedure: SHOULDER ARTHROSCOPY WITH ROTATOR CUFF REPAIR AND SUBACROMIAL DECOMPRESSION;  Surgeon: Jones Broom, MD;  Location: [facility] ORS;  Service: Orthopedics;  Laterality: Right;  NEEDS 90 MINUTES   TEE WITHOUT CARDIOVERSION N/A 09/07/2022   Procedure: TRANSESOPHAGEAL ECHOCARDIOGRAM (TEE);  Surgeon: Alleen Borne, MD;  Location: [facility] OR;  Service: Open Heart Surgery;  Laterality: N/A;   THYROIDECTOMY, PARTIAL     [facility])   Patient Active Problem List   Diagnosis Date Noted   Anxiety and depression 02/21/2023   Pancytopenia (HCC) 02/21/2023   Elevated liver enzymes 02/21/2023   Alcohol use disorder, severe 02/16/2023   S/P ascending aortic aneurysm repair 09/07/2022   Aneurysm of ascending aorta without rupture (HCC) 07/20/2022   Mild persistent asthma without complication 01/24/2020   Prostate cancer (HCC) 01/08/2016   Varicose veins of bilateral lower extremities with other complications 03/01/2013   Former smokeless tobacco use 11/25/2012   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HCC)    GERD (gastroesophageal reflux disease)    Allergic rhinitis due  to pollen    Hypertension    Hyperlipidemia    PTSD (post-traumatic stress disorder)     PCP: Hannah Beat, MD  REFERRING PROVIDER: Berline Lopes  REFERRING DIAG: RIGHT SHOULDER ROTATOR CUFF TEAR   THERAPY DIAG:  Acute pain of right shoulder  Muscle weakness (generalized)  Rationale for Evaluation and Treatment: Rehabilitation  ONSET DATE: 05/27/23  SUBJECTIVE:                                                                                                                                                                                       SUBJECTIVE STATEMENT: Pt reports he had some pain and soreness after last session but is feeling better today. Hand dominance: Right  PERTINENT HISTORY: Pt is a pleasant 63 y.o. male referred to OPPT s/p R RTC tear repair and subacromial decompression 05/27/23. Pt reports having a fall at work in the [employer] onto his R shoulder. Happened December 5th, 2023. Waited for his RTC surgery as he had open heart surgery for ascending aortic aneurysm in February 2024. Pain is minimal, controlled via Tylenol. R handed. Pt works in [employer] lab at [employer] requiring BUE use such as mobilizing patients, transfers, and use of equipment for performing catheterizations.   PAIN:  Are you having pain? None on arrival; noted soreness during end-range stretches and soreness at end of session.   PRECAUTIONS: Other: RTC repair protocol  Weeks Six-Twelve Precautions: -no lifting -no supporting BW with hands and arms -no sudden jerking -no excessive behind the back movements -avoid upper extremity ergometer    RED FLAGS: None   WEIGHT BEARING RESTRICTIONS: No  FALLS:  Has patient fallen in last 6 months? No  LIVING ENVIRONMENT: Lives with: lives with their spouse Lives in: House/apartment  OCCUPATION: Cardiovascular Specialist  PLOF: Independent  PATIENT GOALS:Return to occupation, return to PLOF.  NEXT MD VISIT: N/A  OBJECTIVE:  Note: Objective measures were completed at Evaluation unless otherwise noted.  DIAGNOSTIC FINDINGS:  N/A  PATIENT SURVEYS:  FOTO 47/68  COGNITION: Overall cognitive status: Within functional limits for tasks assessed     SENSATION: WFL  POSTURE: Mildly rounded shoulders  CERVICAL AROM: Flexion: 70 Extension: 55 Rotation R/L: 60/70 Lateral flexion R/L: 45/45  UPPER EXTREMITY ROM:   Active ROM unless otherwise noted Right Eval PROM 11/26 Left eval  Shoulder flexion 81 165   Shoulder extension  65  Shoulder abduction  149  Shoulder adduction    Shoulder internal rotation 55 90  Shoulder external rotation 20 65  Elbow flexion WNL WNL  Elbow extension 0 WNL  Wrist flexion WNL WNL  Wrist extension WNL WNL  Wrist ulnar deviation    Wrist radial deviation    Wrist pronation WNL WNL  Wrist supination WNL WNL  (Blank rows = not tested)  UPPER EXTREMITY MMT:  MMT Right Eval Left Eval  Shoulder flexion deferred 5  Shoulder extension deferred   Shoulder abduction deferred 5  Shoulder adduction    Shoulder internal rotation deferred 5  Shoulder external rotation deferred 5  Middle trapezius    Lower trapezius    Elbow flexion  5  Elbow extension  4 (seated)   TODAY'S TREATMENT:                                                                                                                                         DATE: 09/30/23  There.ex:  Seated R shoulder Arnold presses: 4# DB 3x8   Lat pull down at [employer]: 3x15, 55#   Seated Scap retractions at OMEGA: 3x15, 55#    There.act:  Standing RUE D2 flexion pattern with 3# DB: 3x8   Standing R shoulder 3 KG med ball bounces at end range flexion for RTC stability: 1 x 1.5 minutes, 1x1 minute   Incline push ups on mat table at highest height: 1x6, 2x8    R shoulder abduction CW/CCW rotations at 115 degrees: 1 minute CW/ 1 minute CCW. 1x45 sec CW/CCW  Standing RUE shoulder ER in 90 deg abduction: 3x8, GTB with light TC's on elbows    Manual Therapy: 5 minutes not billed. Used for improving R shoulder stiffness Supine R shoulder Grade 3 mobs AP and inferior in end range flexion fro gentle GHJ mobility. 2x20 sec Supine R shoulder abduction at 90 deg AP grade 3 mobs for GHJ mobility 2x20 sec  Supine R shoulder abduction at 90 deg inferior grade 3 mobs for GHJ mobility 2x20 sec   PATIENT EDUCATION: Education details: Updated precautions/restrictions/HEP per phase 2 protocol Person educated:  Patient Education method: Explanation Education comprehension: verbalized understanding, returned demonstration, verbal cues required, and tactile cues required  HOME EXERCISE PROGRAM: Access Code: [email or web address] URL: [email or web address] Date: 08/31/2023 Prepared by: Ronnie Derby  Exercises - Seated Shoulder Abduction with Bent Elbow  - 1 x daily - 3-4 x weekly - 2 sets - 12 reps - Prone Shoulder Row  - 3 x daily - 1 sets - 10 reps - Supine Shoulder Flexion Extension AAROM with Dowel  - 1 x daily - 4-5 x weekly - 3 sets - 12 reps - Prone Shoulder Row  - 1 x daily - 3 x weekly - 2 sets - 12 reps - Prone Shoulder Extension - Single Arm  - 1 x daily - 3 x weekly - 2 sets - 12 reps - Shoulder Internal Rotation with Resistance  - 1 x daily - 2-3 x weekly - 3 sets - 12 reps - Standing Row with Resistance with Anchored Resistance at Chest Height Palms Down  -  1 x daily - 2-3 x weekly - 3 sets - 8 reps - Single Arm Scaption with Resistance  - 1 x daily - 3-4 x weekly - 3 sets - 8 reps - Standing Shoulder Horizontal Abduction with Resistance  - 1 x daily - 3-4 x weekly - 3 sets - 8 reps - Shoulder External Rotation with Anchored Resistance  - 1 x daily - 3-4 x weekly - 3 sets - 8 reps   Access Code: [email or web address] URL: [email or web address] Date: 08/17/2023 Prepared by: Ronnie Derby  Exercises - Seated Single Arm Shoulder Scaption  - 1 x daily - 3-4 x weekly - 2 sets - 12 reps - Seated Shoulder External Rotation  - 1 x daily - 3-4 x weekly - 2 sets - 12 reps - Seated Shoulder Abduction with Bent Elbow  - 1 x daily - 3-4 x weekly - 2 sets - 12 reps - Prone Shoulder Row  - 3 x daily - 1 sets - 10 reps - Supine Shoulder Flexion Extension AAROM with Dowel  - 1 x daily - 4-5 x weekly - 3 sets - 12 reps - Sidelying Shoulder External Rotation  - 1 x daily - 3 x weekly - 2 sets - 12 reps - Prone Shoulder Row  - 1 x daily - 3 x weekly - 2 sets - 12 reps - Prone Shoulder  Extension - Single Arm  - 1 x daily - 3 x weekly - 2 sets - 12 reps - Shoulder Internal Rotation with Resistance  - 1 x daily - 2-3 x weekly - 3 sets - 12 reps - Shoulder External Rotation and Scapular Retraction with Resistance  - 1 x daily - 2-3 x weekly - 3 sets - 12 reps - Standing Row with Resistance with Anchored Resistance at Chest Height Palms Down  - 1 x daily - 2-3 x weekly - 3 sets - 8 reps   Access Code: [email or web address] URL: [email or web address] Date: 08/11/2023 Prepared by: Ronnie Derby  Exercises - Seated Single Arm Shoulder Scaption  - 1 x daily - 3-4 x weekly - 2 sets - 12 reps - Seated Shoulder External Rotation  - 1 x daily - 3-4 x weekly - 2 sets - 12 reps - Seated Shoulder Abduction with Bent Elbow  - 1 x daily - 3-4 x weekly - 2 sets - 12 reps - Prone Shoulder Row  - 3 x daily - 1 sets - 10 reps - Supine Shoulder Flexion Extension AAROM with Dowel  - 1 x daily - 4-5 x weekly - 3 sets - 12 reps - Sidelying Shoulder External Rotation  - 1 x daily - 3 x weekly - 2 sets - 12 reps - Prone Shoulder Row  - 1 x daily - 3 x weekly - 2 sets - 12 reps - Prone Shoulder Extension - Single Arm  - 1 x daily - 3 x weekly - 2 sets - 12 reps  Access Code: [email or web address] URL: [email or web address] Date: 08/05/2023 Prepared by: Ronnie Derby  Exercises - Prone Shoulder Row  - 3 x daily - 1 sets - 10 reps - Isometric Shoulder Adduction  - 3 x daily - 1 sets - 10 reps - 3 sec hold - Isometric Shoulder Abduction at Wall  - 3 x daily - 1 sets - 10 reps - 3 sec hold - Standing Isometric Shoulder External Rotation with Doorway  - 3 x daily - 1 sets - 10 reps - 3 sec hold - Standing  Isometric Shoulder Flexion with Doorway - Arm Bent  - 3 x daily - 1 sets - 10 reps - 3 sec hold - Standing Isometric Shoulder Internal Rotation at Doorway  - 3 x daily - 1 sets - 10 reps - 3 sec hold - Supine Shoulder Flexion Extension AAROM with Dowel  - 1 x daily - 4-5 x weekly - 3 sets - 12  reps - Sidelying Shoulder External Rotation  - 1 x daily - 3 x weekly - 2 sets - 12 reps - Prone Shoulder Row  - 1 x daily - 3 x weekly - 2 sets - 12 reps - Prone Shoulder Extension - Single Arm  - 1 x daily - 3 x weekly - 2 sets - 12 reps - Prone Shoulder Horizontal Abduction  - 1 x daily - 3 x weekly - 2 sets - 12 reps - Supine Shoulder Flexion Extension Full Range AROM  - 1 x daily - 7 x weekly - 2 sets - 15 reps - Supine Shoulder Abduction AROM  - 1 x daily - 7 x weekly - 2 sets - 15 reps   Access Code: [email or web address] URL: [email or web address] Date: 07/09/2023 Prepared by: Alvera Novel  Exercises - Seated Scapular Retraction  - 3 x daily - 1 sets - 15 reps - Seated Shoulder Shrugs  - 3 x daily - 1 sets - 15 reps - Seated Shoulder Flexion Towel Slide at Table Top  - 3 x daily - 1 sets - 15-20 reps - Seated Shoulder Abduction Towel Slide at Table Top  - 1 x daily - 1 sets - 15-20 reps - Prone Shoulder Row  - 3 x daily - 1 sets - 10 reps - Isometric Shoulder Adduction  - 3 x daily - 1 sets - 10 reps - 3 sec hold - Isometric Shoulder Abduction at Wall  - 3 x daily - 1 sets - 10 reps - 3 sec hold - Standing Isometric Shoulder External Rotation with Doorway  - 3 x daily - 1 sets - 10 reps - 3 sec hold - Standing Isometric Shoulder Flexion with Doorway - Arm Bent  - 3 x daily - 1 sets - 10 reps - 3 sec hold - Standing Isometric Shoulder Internal Rotation at Doorway  - 3 x daily - 1 sets - 10 reps - 3 sec hold  ASSESSMENT:  CLINICAL IMPRESSION: Continuing PT POC with focus on end range flexion and abduction ROM and progressive shoulder strengthening. Pt making steady progress with increased resistance levels without onset of R shoulder pain. Pt does fatigue quicker today than prior sessions but overall good tolerance. Ended session with manual therapy to reduce R shoulder stiffness induced by fatigue. Pt reports relief post intervention. Pt will continue to benefit from skilled Pt  services to progress RUE strength and AROM to return to PLOF.   OBJECTIVE IMPAIRMENTS: decreased mobility, decreased ROM, decreased strength, impaired UE functional use, postural dysfunction, and pain.   ACTIVITY LIMITATIONS: carrying, lifting, bathing, toileting, dressing, reach over head, and hygiene/grooming  PARTICIPATION LIMITATIONS: cleaning, community activity, occupation, and yard work  PERSONAL FACTORS: Age, Education, Fitness, Past/current experiences, Profession, and Time since onset of injury/illness/exacerbation are also affecting patient's functional outcome.   REHAB POTENTIAL: Good  CLINICAL DECISION MAKING: Stable/uncomplicated  EVALUATION COMPLEXITY: Low   GOALS: Goals reviewed with patient? No  SHORT TERM GOALS: Target date: 07/06/23 unless otherwise posted below  Pt will be independent with HEP to improve R shoulder AROM and strength  within protocol parameters to improve functional mobility.  Baseline: 06/08/23: Provided initial HEP; 07/02/23: Independent with HEP progressing PROM.   Goal status: MET  2.  Pt will demonstrate R shoulder AROM in flexion to at least 120 degrees in supine for gradual return to overhead ADL completion. Baseline: 06/08/23: Phase 1 of current protocol (PROM); 08/05/23: 125 degrees Goal status: MET Target Date: 07/21/22 (8 weeks post op)  LONG TERM GOALS: Target date: 10/26/23 unless otherwise posted below  Pt will improve FOTO to target score to demonstrate clinically significant improvement in functional mobility. Baseline: 06/08/23: 47/68; 08/17/23: 16/10; 08/31/23: 64/68 Goal status: ON GOING  2.  Pt will demonstrate full, R shoulder AROM in all planes equal to LUE to demonstrate return to full capacity to complete ADL's.  Baseline: 06/08/23  Active ROM unless otherwise noted Right Eval PROM 11/26 Left eval Right  AROM Seated 08/17/23 Right  AROM Seated 08/31/23  Shoulder flexion 81 165 125 140  Shoulder extension  65     Shoulder abduction  149 91 115  Shoulder adduction      Shoulder internal rotation 55 90    Shoulder external rotation 20 65 60 60  Elbow flexion WNL WNL    Elbow extension 0 WNL    Wrist flexion WNL WNL    Wrist extension WNL WNL    Wrist ulnar deviation      Wrist radial deviation      Wrist pronation WNL WNL    Wrist supination WNL WNL    (Blank rows = not tested) Goal status: INITIAL  3.  Pt will be independent with updated HEP program including periscapular and RTC strength/stability related exercises to begin return to ability to complete work related and recreational related activity.   Baseline: 06/08/23: ; 08/17/23: independent with progressive protocol Goal status: MET  4.  Pt will report return to full work related tasks without strength or other reported functional deficits to demonstrate return to PLOF.  Baseline:  Goal status: INITIAL Target date: 11/28/22 (16 weeks post-op) PLAN:  PT FREQUENCY: 1-2x/week  PT DURATION: 8 weeks  PLANNED INTERVENTIONS: 97164- PT Re-evaluation, 97110-Therapeutic exercises, 97530- Therapeutic activity, 97112- Neuromuscular re-education, 97535- Self Care, 96045- Manual therapy, 97014- Electrical stimulation (unattended), [phone]- Electrical stimulation (manual), Patient/Family education, Dry Needling, Joint mobilization, Spinal manipulation, Spinal mobilization, Cryotherapy, Moist heat, and Biofeedback  PLAN FOR NEXT SESSION: UPDATE HEP. week 16 of protocol. End range flexion and abduction with scapulohumeral rhythm. Global R shoulder strengthening. Thoracic rotation mimicking golf swing.  Delphia Grates. Fairly IV, PT, DPT Physical Therapist- [place]  [facility]  5:10 PM, 09/30/23

## 2023-10-04 ENCOUNTER — Ambulatory Visit: Payer: Worker's Compensation

## 2023-10-05 ENCOUNTER — Ambulatory Visit: Payer: PRIVATE HEALTH INSURANCE

## 2023-10-05 DIAGNOSIS — M6281 Muscle weakness (generalized): Secondary | ICD-10-CM

## 2023-10-05 DIAGNOSIS — M25511 Pain in right shoulder: Secondary | ICD-10-CM | POA: Diagnosis not present

## 2023-10-05 NOTE — Therapy (Unsigned)
 OUTPATIENT PHYSICAL THERAPY SHOULDER TREATMENT/PROGRESS NOTE  Dates of Reporting Period: 08/17/23 - 10/05/23  Patient Name: Austin Houston MRN: [id number] DOB:[birth year], 63 y.o., male Today's Date: 10/06/2023  END OF SESSION:  PT End of Session - 10/05/23 1602     Visit Number 20    Number of Visits 25    Date for PT Re-Evaluation 10/26/23    Authorization Type [facility]    Progress Note Due on Visit 10    PT Start Time 1601    PT Stop Time 1644    PT Time Calculation (min) 43 min    Activity Tolerance Patient tolerated treatment well    Behavior During Therapy WFL for tasks assessed/performed             Past Medical History:  Diagnosis Date   Actinic keratosis    Allergic rhinitis due to pollen    Asthma    vs COPD- taking inhalers- as related to allergies only.   Basal cell carcinoma 03/26/2020   R lat deltoid - ED&C    Dysplastic nevus 03/07/2020   L costal infrapectoral - moderate   Dysplastic nevus 03/07/2020   R post flank above waistline - moderate   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HCC)    s/p partial thyroidectomy (follicular adenoma)-surgery only   GERD (gastroesophageal reflux disease)    Hearing impaired person, bilateral    hearing aida bilateral"high frequency loss   Hyperlipidemia    Hypertension    Prostate cancer (HCC) 01/08/2016   PTSD (post-traumatic stress disorder)    s/p multiple active deployments for [employer], no medications taken currently   Past Surgical History:  Procedure Laterality Date   COLONOSCOPY W/ POLYPECTOMY     age 72 "adenoma"   INGUINAL HERNIA REPAIR     right   LEFT HEART CATH AND CORONARY ANGIOGRAPHY N/A 07/20/2022   Procedure: LEFT HEART CATH AND CORONARY ANGIOGRAPHY;  Surgeon: Tonny Bollman, MD;  Location: [facility] INVASIVE CV LAB;  Service: Cardiovascular;  Laterality: N/A;   REPLACEMENT ASCENDING AORTA N/A 09/07/2022   Procedure: REPLACEMENT ASCENDING AORTA;  Surgeon: Alleen Borne, MD;  Location:  [facility] OR;  Service: Open Heart Surgery;  Laterality: N/A;  WITH CIRC ARREST   ROBOT ASSISTED LAPAROSCOPIC RADICAL PROSTATECTOMY N/A 07/23/2016   Procedure: XI ROBOTIC ASSISTED LAPAROSCOPIC RADICAL PROSTATECTOMY LEVEL 1;  Surgeon: Heloise Purpura, MD;  Location: [facility] ORS;  Service: Urology;  Laterality: N/A;   SHOULDER ARTHROSCOPY WITH ROTATOR CUFF REPAIR AND SUBACROMIAL DECOMPRESSION Right 05/27/2023   Procedure: SHOULDER ARTHROSCOPY WITH ROTATOR CUFF REPAIR AND SUBACROMIAL DECOMPRESSION;  Surgeon: Jones Broom, MD;  Location: [facility] ORS;  Service: Orthopedics;  Laterality: Right;  NEEDS 90 MINUTES   TEE WITHOUT CARDIOVERSION N/A 09/07/2022   Procedure: TRANSESOPHAGEAL ECHOCARDIOGRAM (TEE);  Surgeon: Alleen Borne, MD;  Location: [facility] OR;  Service: Open Heart Surgery;  Laterality: N/A;   THYROIDECTOMY, PARTIAL     [facility])   Patient Active Problem List   Diagnosis Date Noted   Anxiety and depression 02/21/2023   Pancytopenia (HCC) 02/21/2023   Elevated liver enzymes 02/21/2023   Alcohol use disorder, severe 02/16/2023   S/P ascending aortic aneurysm repair 09/07/2022   Aneurysm of ascending aorta without rupture (HCC) 07/20/2022   Mild persistent asthma without complication 01/24/2020   Prostate cancer (HCC) 01/08/2016   Varicose veins of bilateral lower extremities with other complications 03/01/2013   Former smokeless tobacco use 11/25/2012   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HCC)    GERD (  gastroesophageal reflux disease)    Allergic rhinitis due to pollen    Hypertension    Hyperlipidemia    PTSD (post-traumatic stress disorder)     PCP: Hannah Beat, MD  REFERRING PROVIDER: Berline Lopes  REFERRING DIAG: RIGHT SHOULDER ROTATOR CUFF TEAR   THERAPY DIAG:  Acute pain of right shoulder  Muscle weakness (generalized)  Rationale for Evaluation and Treatment: Rehabilitation  ONSET DATE: 05/27/23  SUBJECTIVE:                                                                                                                                                                                       SUBJECTIVE STATEMENT: Pt reports he did a lot of yard work for his mother over the weekend. Also waxed her car. Returning to normal outdoor activities. Felt better with manual intervention after last session. Hand dominance: Right  PERTINENT HISTORY: Pt is a pleasant 63 y.o. male referred to OPPT s/p R RTC tear repair and subacromial decompression 05/27/23. Pt reports having a fall at work in the [employer] onto his R shoulder. Happened December 5th, 2023. Waited for his RTC surgery as he had open heart surgery for ascending aortic aneurysm in February 2024. Pain is minimal, controlled via Tylenol. R handed. Pt works in [employer] lab at [employer] requiring BUE use such as mobilizing patients, transfers, and use of equipment for performing catheterizations.   PAIN:  Are you having pain? None on arrival; noted soreness during end-range stretches and soreness at end of session.   PRECAUTIONS: Other: RTC repair protocol  Weeks Six-Twelve Precautions: -no lifting -no supporting BW with hands and arms -no sudden jerking -no excessive behind the back movements -avoid upper extremity ergometer    RED FLAGS: None   WEIGHT BEARING RESTRICTIONS: No  FALLS:  Has patient fallen in last 6 months? No  LIVING ENVIRONMENT: Lives with: lives with their spouse Lives in: House/apartment  OCCUPATION: Cardiovascular Specialist  PLOF: Independent  PATIENT GOALS:Return to occupation, return to PLOF.  NEXT MD VISIT: N/A  OBJECTIVE:  Note: Objective measures were completed at Evaluation unless otherwise noted.  DIAGNOSTIC FINDINGS:  N/A  PATIENT SURVEYS:  FOTO 47/68  COGNITION: Overall cognitive status: Within functional limits for tasks assessed     SENSATION: WFL  POSTURE: Mildly rounded shoulders  CERVICAL AROM: Flexion: 70 Extension: 55 Rotation R/L:  60/70 Lateral flexion R/L: 45/45  UPPER EXTREMITY ROM:   Active ROM unless otherwise noted Right Eval PROM 11/26 Left eval  Shoulder flexion 81 165  Shoulder extension  65  Shoulder abduction  149  Shoulder adduction    Shoulder internal rotation 55 90  Shoulder external rotation 20 65  Elbow flexion WNL WNL  Elbow extension 0 WNL  Wrist flexion WNL WNL  Wrist extension WNL WNL  Wrist ulnar deviation    Wrist radial deviation    Wrist pronation WNL WNL  Wrist supination WNL WNL  (Blank rows = not tested)  UPPER EXTREMITY MMT:  MMT Right Eval Left Eval  Shoulder flexion deferred 5  Shoulder extension deferred   Shoulder abduction deferred 5  Shoulder adduction    Shoulder internal rotation deferred 5  Shoulder external rotation deferred 5  Middle trapezius    Lower trapezius    Elbow flexion  5  Elbow extension  4 (seated)   TODAY'S TREATMENT:                                                                                                                                         DATE: 10/06/23  There.ex:  Seated R shoulder Arnold presses: 4# DB 3x8   Lat pull down at [employer]: 2x12, 1x8 65#   Seated Scap retractions at OMEGA: 3x12, 65#    There.act:  Standing Alternating punches with 4# DB's: 3x12  Standing R shoulder arnold presses: 4# DB 3x15  Incline push ups on mat table at highest height: 3x8   Standing R shoulder abduction long lever arm to 90 degrees: 3x15, 3# DB   Manual Therapy: 10 minutes supine, Used for improving R shoulder stiffness post exercise and for pain modulation.  Supine R shoulder Grade 3 mobs AP and inferior in end range flexion fro gentle GHJ mobility. 2x20 sec Supine R shoulder abduction at 90 deg AP grade 3 mobs for GHJ mobility 2x20 sec  Supine R shoulder abduction at 90 deg inferior grade 3 mobs for GHJ mobility 2x20 sec   PATIENT EDUCATION: Education details: Updated precautions/restrictions/HEP per phase 2 protocol Person  educated: Patient Education method: Explanation Education comprehension: verbalized understanding, returned demonstration, verbal cues required, and tactile cues required  HOME EXERCISE PROGRAM: Access Code: [email or web address] URL: [email or web address] Date: 08/31/2023 Prepared by: Ronnie Derby  Exercises - Seated Shoulder Abduction with Bent Elbow  - 1 x daily - 3-4 x weekly - 2 sets - 12 reps - Prone Shoulder Row  - 3 x daily - 1 sets - 10 reps - Supine Shoulder Flexion Extension AAROM with Dowel  - 1 x daily - 4-5 x weekly - 3 sets - 12 reps - Prone Shoulder Row  - 1 x daily - 3 x weekly - 2 sets - 12 reps - Prone Shoulder Extension - Single Arm  - 1 x daily - 3 x weekly - 2 sets - 12 reps - Shoulder Internal Rotation with Resistance  - 1 x daily - 2-3 x weekly - 3 sets - 12 reps - Standing Row with Resistance with Anchored Resistance at Chest Height Palms Down  - 1 x daily - 2-3 x weekly -  3 sets - 8 reps - Single Arm Scaption with Resistance  - 1 x daily - 3-4 x weekly - 3 sets - 8 reps - Standing Shoulder Horizontal Abduction with Resistance  - 1 x daily - 3-4 x weekly - 3 sets - 8 reps - Shoulder External Rotation with Anchored Resistance  - 1 x daily - 3-4 x weekly - 3 sets - 8 reps   Access Code: [email or web address] URL: [email or web address] Date: 08/17/2023 Prepared by: Ronnie Derby  Exercises - Seated Single Arm Shoulder Scaption  - 1 x daily - 3-4 x weekly - 2 sets - 12 reps - Seated Shoulder External Rotation  - 1 x daily - 3-4 x weekly - 2 sets - 12 reps - Seated Shoulder Abduction with Bent Elbow  - 1 x daily - 3-4 x weekly - 2 sets - 12 reps - Prone Shoulder Row  - 3 x daily - 1 sets - 10 reps - Supine Shoulder Flexion Extension AAROM with Dowel  - 1 x daily - 4-5 x weekly - 3 sets - 12 reps - Sidelying Shoulder External Rotation  - 1 x daily - 3 x weekly - 2 sets - 12 reps - Prone Shoulder Row  - 1 x daily - 3 x weekly - 2 sets - 12 reps - Prone  Shoulder Extension - Single Arm  - 1 x daily - 3 x weekly - 2 sets - 12 reps - Shoulder Internal Rotation with Resistance  - 1 x daily - 2-3 x weekly - 3 sets - 12 reps - Shoulder External Rotation and Scapular Retraction with Resistance  - 1 x daily - 2-3 x weekly - 3 sets - 12 reps - Standing Row with Resistance with Anchored Resistance at Chest Height Palms Down  - 1 x daily - 2-3 x weekly - 3 sets - 8 reps   Access Code: [email or web address] URL: [email or web address] Date: 08/11/2023 Prepared by: Ronnie Derby  Exercises - Seated Single Arm Shoulder Scaption  - 1 x daily - 3-4 x weekly - 2 sets - 12 reps - Seated Shoulder External Rotation  - 1 x daily - 3-4 x weekly - 2 sets - 12 reps - Seated Shoulder Abduction with Bent Elbow  - 1 x daily - 3-4 x weekly - 2 sets - 12 reps - Prone Shoulder Row  - 3 x daily - 1 sets - 10 reps - Supine Shoulder Flexion Extension AAROM with Dowel  - 1 x daily - 4-5 x weekly - 3 sets - 12 reps - Sidelying Shoulder External Rotation  - 1 x daily - 3 x weekly - 2 sets - 12 reps - Prone Shoulder Row  - 1 x daily - 3 x weekly - 2 sets - 12 reps - Prone Shoulder Extension - Single Arm  - 1 x daily - 3 x weekly - 2 sets - 12 reps  Access Code: [email or web address] URL: [email or web address] Date: 08/05/2023 Prepared by: Ronnie Derby  Exercises - Prone Shoulder Row  - 3 x daily - 1 sets - 10 reps - Isometric Shoulder Adduction  - 3 x daily - 1 sets - 10 reps - 3 sec hold - Isometric Shoulder Abduction at Wall  - 3 x daily - 1 sets - 10 reps - 3 sec hold - Standing Isometric Shoulder External Rotation with Doorway  - 3 x daily - 1 sets - 10 reps - 3 sec hold - Standing Isometric Shoulder Flexion with Doorway - Arm Bent  -  3 x daily - 1 sets - 10 reps - 3 sec hold - Standing Isometric Shoulder Internal Rotation at Doorway  - 3 x daily - 1 sets - 10 reps - 3 sec hold - Supine Shoulder Flexion Extension AAROM with Dowel  - 1 x daily - 4-5 x weekly - 3  sets - 12 reps - Sidelying Shoulder External Rotation  - 1 x daily - 3 x weekly - 2 sets - 12 reps - Prone Shoulder Row  - 1 x daily - 3 x weekly - 2 sets - 12 reps - Prone Shoulder Extension - Single Arm  - 1 x daily - 3 x weekly - 2 sets - 12 reps - Prone Shoulder Horizontal Abduction  - 1 x daily - 3 x weekly - 2 sets - 12 reps - Supine Shoulder Flexion Extension Full Range AROM  - 1 x daily - 7 x weekly - 2 sets - 15 reps - Supine Shoulder Abduction AROM  - 1 x daily - 7 x weekly - 2 sets - 15 reps   Access Code: [email or web address] URL: [email or web address] Date: 07/09/2023 Prepared by: Alvera Novel  Exercises - Seated Scapular Retraction  - 3 x daily - 1 sets - 15 reps - Seated Shoulder Shrugs  - 3 x daily - 1 sets - 15 reps - Seated Shoulder Flexion Towel Slide at Table Top  - 3 x daily - 1 sets - 15-20 reps - Seated Shoulder Abduction Towel Slide at Table Top  - 1 x daily - 1 sets - 15-20 reps - Prone Shoulder Row  - 3 x daily - 1 sets - 10 reps - Isometric Shoulder Adduction  - 3 x daily - 1 sets - 10 reps - 3 sec hold - Isometric Shoulder Abduction at Wall  - 3 x daily - 1 sets - 10 reps - 3 sec hold - Standing Isometric Shoulder External Rotation with Doorway  - 3 x daily - 1 sets - 10 reps - 3 sec hold - Standing Isometric Shoulder Flexion with Doorway - Arm Bent  - 3 x daily - 1 sets - 10 reps - 3 sec hold - Standing Isometric Shoulder Internal Rotation at Doorway  - 3 x daily - 1 sets - 10 reps - 3 sec hold  ASSESSMENT:  CLINICAL IMPRESSION: Pt at 20th visit warranting progress note. Pt remains in latter stage of protocol with progressive strengthening in overhead ranges. Pt making good progress with less painful abduction in resistance. On 09/23/23 pt achieving 150 deg flexion in R shoulder AROM and 140 abduction without pain. Continuing to progress overhead endurance and strength training for full return to ADL's and work related tasks. Also incorporating manual  interventions at end of session to reduce pain and stiffness with progressive overloading. Patient's condition has the potential to improve in response to therapy. Maximum improvement is yet to be obtained. The anticipated improvement is attainable and reasonable in a generally predictable time. Pt will continue to benefit from skilled Pt services to progress RUE strength and AROM to return to PLOF.  OBJECTIVE IMPAIRMENTS: decreased mobility, decreased ROM, decreased strength, impaired UE functional use, postural dysfunction, and pain.   ACTIVITY LIMITATIONS: carrying, lifting, bathing, toileting, dressing, reach over head, and hygiene/grooming  PARTICIPATION LIMITATIONS: cleaning, community activity, occupation, and yard work  PERSONAL FACTORS: Age, Education, Fitness, Past/current experiences, Profession, and Time since onset of injury/illness/exacerbation are also affecting patient's functional outcome.   REHAB POTENTIAL: Good  CLINICAL DECISION MAKING:  Stable/uncomplicated  EVALUATION COMPLEXITY: Low   GOALS: Goals reviewed with patient? No  SHORT TERM GOALS: Target date: 07/06/23 unless otherwise posted below  Pt will be independent with HEP to improve R shoulder AROM and strength within protocol parameters to improve functional mobility.  Baseline: 06/08/23: Provided initial HEP; 07/02/23: Independent with HEP progressing PROM.   Goal status: MET  2.  Pt will demonstrate R shoulder AROM in flexion to at least 120 degrees in supine for gradual return to overhead ADL completion. Baseline: 06/08/23: Phase 1 of current protocol (PROM); 08/05/23: 125 degrees Goal status: MET Target Date: 07/21/22 (8 weeks post op)  LONG TERM GOALS: Target date: 10/26/23 unless otherwise posted below  Pt will improve FOTO to target score to demonstrate clinically significant improvement in functional mobility. Baseline: 06/08/23: 47/68; 08/17/23: 16/10; 08/31/23: 64/68 Goal status: ON GOING  2.  Pt will  demonstrate full, R shoulder AROM in all planes equal to LUE to demonstrate return to full capacity to complete ADL's.  Baseline: 06/08/23  Active ROM unless otherwise noted Right Eval PROM 11/26 Left eval Right  AROM Seated 08/17/23 Right  AROM Seated 08/31/23  Shoulder flexion 81 165 125 140  Shoulder extension  65    Shoulder abduction  149 91 115  Shoulder adduction      Shoulder internal rotation 55 90    Shoulder external rotation 20 65 60 60  Elbow flexion WNL WNL    Elbow extension 0 WNL    Wrist flexion WNL WNL    Wrist extension WNL WNL    Wrist ulnar deviation      Wrist radial deviation      Wrist pronation WNL WNL    Wrist supination WNL WNL    (Blank rows = not tested) Goal status: INITIAL  3.  Pt will be independent with updated HEP program including periscapular and RTC strength/stability related exercises to begin return to ability to complete work related and recreational related activity.   Baseline: 06/08/23: ; 08/17/23: independent with progressive protocol Goal status: MET  4.  Pt will report return to full work related tasks without strength or other reported functional deficits to demonstrate return to PLOF.  Baseline:  Goal status: INITIAL Target date: 11/28/22 (16 weeks post-op) PLAN:  PT FREQUENCY: 1-2x/week  PT DURATION: 8 weeks  PLANNED INTERVENTIONS: 97164- PT Re-evaluation, 97110-Therapeutic exercises, 97530- Therapeutic activity, 97112- Neuromuscular re-education, 97535- Self Care, 96045- Manual therapy, 97014- Electrical stimulation (unattended), [phone]- Electrical stimulation (manual), Patient/Family education, Dry Needling, Joint mobilization, Spinal manipulation, Spinal mobilization, Cryotherapy, Moist heat, and Biofeedback  PLAN FOR NEXT SESSION: UPDATE HEP. week 16 of protocol. End range flexion and abduction with scapulohumeral rhythm. Global R shoulder strengthening. Thoracic rotation mimicking golf swing.  Delphia Grates. Fairly IV, PT,  DPT Physical Therapist- [facility]  8:49 AM, 10/06/23

## 2023-10-06 ENCOUNTER — Ambulatory Visit: Payer: PRIVATE HEALTH INSURANCE

## 2023-10-07 ENCOUNTER — Ambulatory Visit: Payer: PRIVATE HEALTH INSURANCE

## 2023-10-07 DIAGNOSIS — M6281 Muscle weakness (generalized): Secondary | ICD-10-CM

## 2023-10-07 DIAGNOSIS — M25511 Pain in right shoulder: Secondary | ICD-10-CM | POA: Diagnosis not present

## 2023-10-07 NOTE — Therapy (Signed)
 OUTPATIENT PHYSICAL THERAPY SHOULDER TREATMENT  Patient Name: Austin Houston MRN: [id number] DOB:1961/01/16, 63 y.o., male Today's Date: 10/07/2023  END OF SESSION:  PT End of Session - 10/07/23 1433     Visit Number 21    Number of Visits 25    Date for PT Re-Evaluation 10/26/23    Authorization Type [facility]    Progress Note Due on Visit 10    PT Start Time 1433    PT Stop Time 1513    PT Time Calculation (min) 40 min    Activity Tolerance Patient tolerated treatment well    Behavior During Therapy WFL for tasks assessed/performed             Past Medical History:  Diagnosis Date   Actinic keratosis    Allergic rhinitis due to pollen    Asthma    vs COPD- taking inhalers- as related to allergies only.   Basal cell carcinoma 03/26/2020   R lat deltoid - ED&C    Dysplastic nevus 03/07/2020   L costal infrapectoral - moderate   Dysplastic nevus 03/07/2020   R post flank above waistline - moderate   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HCC)    s/p partial thyroidectomy (follicular adenoma)-surgery only   GERD (gastroesophageal reflux disease)    Hearing impaired person, bilateral    hearing aida bilateral"high frequency loss   Hyperlipidemia    Hypertension    Prostate cancer (HCC) 01/08/2016   PTSD (post-traumatic stress disorder)    s/p multiple active deployments for [employer], no medications taken currently   Past Surgical History:  Procedure Laterality Date   COLONOSCOPY W/ POLYPECTOMY     age 13 "adenoma"   INGUINAL HERNIA REPAIR     right   LEFT HEART CATH AND CORONARY ANGIOGRAPHY N/A 07/20/2022   Procedure: LEFT HEART CATH AND CORONARY ANGIOGRAPHY;  Surgeon: Tonny Bollman, MD;  Location: [facility] INVASIVE CV LAB;  Service: Cardiovascular;  Laterality: N/A;   REPLACEMENT ASCENDING AORTA N/A 09/07/2022   Procedure: REPLACEMENT ASCENDING AORTA;  Surgeon: Alleen Borne, MD;  Location: [facility] OR;  Service: Open Heart Surgery;  Laterality: N/A;   WITH CIRC ARREST   ROBOT ASSISTED LAPAROSCOPIC RADICAL PROSTATECTOMY N/A 07/23/2016   Procedure: XI ROBOTIC ASSISTED LAPAROSCOPIC RADICAL PROSTATECTOMY LEVEL 1;  Surgeon: Heloise Purpura, MD;  Location: [facility] ORS;  Service: Urology;  Laterality: N/A;   SHOULDER ARTHROSCOPY WITH ROTATOR CUFF REPAIR AND SUBACROMIAL DECOMPRESSION Right 05/27/2023   Procedure: SHOULDER ARTHROSCOPY WITH ROTATOR CUFF REPAIR AND SUBACROMIAL DECOMPRESSION;  Surgeon: Jones Broom, MD;  Location: [facility] ORS;  Service: Orthopedics;  Laterality: Right;  NEEDS 90 MINUTES   TEE WITHOUT CARDIOVERSION N/A 09/07/2022   Procedure: TRANSESOPHAGEAL ECHOCARDIOGRAM (TEE);  Surgeon: Alleen Borne, MD;  Location: [facility] OR;  Service: Open Heart Surgery;  Laterality: N/A;   THYROIDECTOMY, PARTIAL     [facility])   Patient Active Problem List   Diagnosis Date Noted   Anxiety and depression 02/21/2023   Pancytopenia (HCC) 02/21/2023   Elevated liver enzymes 02/21/2023   Alcohol use disorder, severe 02/16/2023   S/P ascending aortic aneurysm repair 09/07/2022   Aneurysm of ascending aorta without rupture (HCC) 07/20/2022   Mild persistent asthma without complication 01/24/2020   Prostate cancer (HCC) 01/08/2016   Varicose veins of bilateral lower extremities with other complications 03/01/2013   Former smokeless tobacco use 11/25/2012   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HCC)    GERD (gastroesophageal reflux disease)    Allergic rhinitis due  to pollen    Hypertension    Hyperlipidemia    PTSD (post-traumatic stress disorder)     PCP: Hannah Beat, MD  REFERRING PROVIDER: Berline Lopes  REFERRING DIAG: RIGHT SHOULDER ROTATOR CUFF TEAR   THERAPY DIAG:  Acute pain of right shoulder  Muscle weakness (generalized)  Rationale for Evaluation and Treatment: Rehabilitation  ONSET DATE: 05/27/23  SUBJECTIVE:                                                                                                                                                                                       SUBJECTIVE STATEMENT: Patient reported that manual therapy in last session improved pain and stiffness. Prior to PT, he worked in the yard and lifted [employer] (~40#); some soreness following yard work.  Hand dominance: Right  PERTINENT HISTORY: Pt is a pleasant 63 y.o. male referred to OPPT s/p R RTC tear repair and subacromial decompression 05/27/23. Pt reports having a fall at work in the [employer] onto his R shoulder. Happened December 5th, 2023. Waited for his RTC surgery as he had open heart surgery for ascending aortic aneurysm in February 2024. Pain is minimal, controlled via Tylenol. R handed. Pt works in [employer] lab at [employer] requiring BUE use such as mobilizing patients, transfers, and use of equipment for performing catheterizations.   PAIN:  Are you having pain? None on arrival; noted soreness during end-range stretches and soreness at end of session.   PRECAUTIONS: Other: RTC repair protocol  Weeks Six-Twelve Precautions: -no lifting -no supporting BW with hands and arms -no sudden jerking -no excessive behind the back movements -avoid upper extremity ergometer    RED FLAGS: None   WEIGHT BEARING RESTRICTIONS: No  FALLS:  Has patient fallen in last 6 months? No  LIVING ENVIRONMENT: Lives with: lives with their spouse Lives in: House/apartment  OCCUPATION: Cardiovascular Specialist  PLOF: Independent  PATIENT GOALS:Return to occupation, return to PLOF.  NEXT MD VISIT: N/A  OBJECTIVE:  Note: Objective measures were completed at Evaluation unless otherwise noted.  DIAGNOSTIC FINDINGS:  N/A  PATIENT SURVEYS:  FOTO 47/68  COGNITION: Overall cognitive status: Within functional limits for tasks assessed     SENSATION: WFL  POSTURE: Mildly rounded shoulders  CERVICAL AROM: Flexion: 70 Extension: 55 Rotation R/L: 60/70 Lateral flexion R/L: 45/45  UPPER EXTREMITY ROM:    Active ROM unless otherwise noted Right Eval PROM 11/26 Left eval  Shoulder flexion 81 165  Shoulder extension  65  Shoulder abduction  149  Shoulder adduction    Shoulder internal rotation 55 90  Shoulder external rotation 20 65  Elbow flexion WNL WNL  Elbow extension 0 WNL  Wrist flexion WNL WNL  Wrist extension WNL WNL  Wrist ulnar deviation    Wrist radial deviation    Wrist pronation WNL WNL  Wrist supination WNL WNL  (Blank rows = not tested)  UPPER EXTREMITY MMT:  MMT Right Eval Left Eval  Shoulder flexion deferred 5  Shoulder extension deferred   Shoulder abduction deferred 5  Shoulder adduction    Shoulder internal rotation deferred 5  Shoulder external rotation deferred 5  Middle trapezius    Lower trapezius    Elbow flexion  5  Elbow extension  4 (seated)   TODAY'S TREATMENT:                                                                                                                                         DATE: 10/07/23  There.ex:   Standing Wall Circles CCW/CW direction 30s each direction   Manual at R wrist Rhythmic Stabilization at 90 Shoulder Flexion 2 x 30sec, in order to improve rotator cuff muscular endurance and strength  Manual at R wrist, Rhythmic Stabilization at 90 Shoulder Flexion 2 x 30sec 2#DB, 3# DB, in order to improve rotator cuff muscular endurance and strength  OMEGA:  Scapular Retractions 3x12 65#   There.act:   Pronation/Supination with Hammer at 90 shoulder flexion, 3# AW attached at end of hammer: 1x5x,1x10 ea direction  Incline push ups on mat table at highest height: 1x8   Incline push ups on reverse bosu on mat table at highest height: 2x10   Shoulder Press 5# 1x10, 10# DB 2x12    Manual Therapy: 10 minutes supine, Used for improving R shoulder stiffness post exercise and for pain modulation. Gentle GHJ Oscillations in between sets   Supine End range R shoulder Flexion with OP for GHJ mobility and inferior  flexibility 3x30  Supine R shoulder abduction at 90 deg AP grade 3 mobs for GHJ mobility 3x30 sec   Supine R shoulder abduction at 90 deg inferior grade 3 mobs for GHJ mobility 3x30 sec   PATIENT EDUCATION: Education details: Updated precautions/restrictions/HEP per phase 2 protocol Person educated: Patient Education method: Explanation Education comprehension: verbalized understanding, returned demonstration, verbal cues required, and tactile cues required  HOME EXERCISE PROGRAM: Access Code: [email or web address] URL: [email or web address] Date: 08/31/2023 Prepared by: Ronnie Derby  Exercises - Seated Shoulder Abduction with Bent Elbow  - 1 x daily - 3-4 x weekly - 2 sets - 12 reps - Prone Shoulder Row  - 3 x daily - 1 sets - 10 reps - Supine Shoulder Flexion Extension AAROM with Dowel  - 1 x daily - 4-5 x weekly - 3 sets - 12 reps - Prone Shoulder Row  - 1 x daily - 3 x weekly - 2 sets - 12 reps - Prone Shoulder Extension - Single Arm  - 1 x daily - 3 x weekly - 2 sets -  12 reps - Shoulder Internal Rotation with Resistance  - 1 x daily - 2-3 x weekly - 3 sets - 12 reps - Standing Row with Resistance with Anchored Resistance at Chest Height Palms Down  - 1 x daily - 2-3 x weekly - 3 sets - 8 reps - Single Arm Scaption with Resistance  - 1 x daily - 3-4 x weekly - 3 sets - 8 reps - Standing Shoulder Horizontal Abduction with Resistance  - 1 x daily - 3-4 x weekly - 3 sets - 8 reps - Shoulder External Rotation with Anchored Resistance  - 1 x daily - 3-4 x weekly - 3 sets - 8 reps   Access Code: [email or web address] URL: [email or web address] Date: 08/17/2023 Prepared by: Ronnie Derby  Exercises - Seated Single Arm Shoulder Scaption  - 1 x daily - 3-4 x weekly - 2 sets - 12 reps - Seated Shoulder External Rotation  - 1 x daily - 3-4 x weekly - 2 sets - 12 reps - Seated Shoulder Abduction with Bent Elbow  - 1 x daily - 3-4 x weekly - 2 sets - 12 reps - Prone Shoulder Row  -  3 x daily - 1 sets - 10 reps - Supine Shoulder Flexion Extension AAROM with Dowel  - 1 x daily - 4-5 x weekly - 3 sets - 12 reps - Sidelying Shoulder External Rotation  - 1 x daily - 3 x weekly - 2 sets - 12 reps - Prone Shoulder Row  - 1 x daily - 3 x weekly - 2 sets - 12 reps - Prone Shoulder Extension - Single Arm  - 1 x daily - 3 x weekly - 2 sets - 12 reps - Shoulder Internal Rotation with Resistance  - 1 x daily - 2-3 x weekly - 3 sets - 12 reps - Shoulder External Rotation and Scapular Retraction with Resistance  - 1 x daily - 2-3 x weekly - 3 sets - 12 reps - Standing Row with Resistance with Anchored Resistance at Chest Height Palms Down  - 1 x daily - 2-3 x weekly - 3 sets - 8 reps   Access Code: [email or web address] URL: [email or web address] Date: 08/11/2023 Prepared by: Ronnie Derby  Exercises - Seated Single Arm Shoulder Scaption  - 1 x daily - 3-4 x weekly - 2 sets - 12 reps - Seated Shoulder External Rotation  - 1 x daily - 3-4 x weekly - 2 sets - 12 reps - Seated Shoulder Abduction with Bent Elbow  - 1 x daily - 3-4 x weekly - 2 sets - 12 reps - Prone Shoulder Row  - 3 x daily - 1 sets - 10 reps - Supine Shoulder Flexion Extension AAROM with Dowel  - 1 x daily - 4-5 x weekly - 3 sets - 12 reps - Sidelying Shoulder External Rotation  - 1 x daily - 3 x weekly - 2 sets - 12 reps - Prone Shoulder Row  - 1 x daily - 3 x weekly - 2 sets - 12 reps - Prone Shoulder Extension - Single Arm  - 1 x daily - 3 x weekly - 2 sets - 12 reps  Access Code: [email or web address] URL: [email or web address] Date: 08/05/2023 Prepared by: Ronnie Derby  Exercises - Prone Shoulder Row  - 3 x daily - 1 sets - 10 reps - Isometric Shoulder Adduction  - 3 x daily - 1 sets - 10 reps - 3 sec hold - Isometric Shoulder Abduction at Wall  -  3 x daily - 1 sets - 10 reps - 3 sec hold - Standing Isometric Shoulder External Rotation with Doorway  - 3 x daily - 1 sets - 10 reps - 3 sec hold -  Standing Isometric Shoulder Flexion with Doorway - Arm Bent  - 3 x daily - 1 sets - 10 reps - 3 sec hold - Standing Isometric Shoulder Internal Rotation at Doorway  - 3 x daily - 1 sets - 10 reps - 3 sec hold - Supine Shoulder Flexion Extension AAROM with Dowel  - 1 x daily - 4-5 x weekly - 3 sets - 12 reps - Sidelying Shoulder External Rotation  - 1 x daily - 3 x weekly - 2 sets - 12 reps - Prone Shoulder Row  - 1 x daily - 3 x weekly - 2 sets - 12 reps - Prone Shoulder Extension - Single Arm  - 1 x daily - 3 x weekly - 2 sets - 12 reps - Prone Shoulder Horizontal Abduction  - 1 x daily - 3 x weekly - 2 sets - 12 reps - Supine Shoulder Flexion Extension Full Range AROM  - 1 x daily - 7 x weekly - 2 sets - 15 reps - Supine Shoulder Abduction AROM  - 1 x daily - 7 x weekly - 2 sets - 15 reps   Access Code: [email or web address] URL: [email or web address] Date: 07/09/2023 Prepared by: Alvera Novel  Exercises - Seated Scapular Retraction  - 3 x daily - 1 sets - 15 reps - Seated Shoulder Shrugs  - 3 x daily - 1 sets - 15 reps - Seated Shoulder Flexion Towel Slide at Table Top  - 3 x daily - 1 sets - 15-20 reps - Seated Shoulder Abduction Towel Slide at Table Top  - 1 x daily - 1 sets - 15-20 reps - Prone Shoulder Row  - 3 x daily - 1 sets - 10 reps - Isometric Shoulder Adduction  - 3 x daily - 1 sets - 10 reps - 3 sec hold - Isometric Shoulder Abduction at Wall  - 3 x daily - 1 sets - 10 reps - 3 sec hold - Standing Isometric Shoulder External Rotation with Doorway  - 3 x daily - 1 sets - 10 reps - 3 sec hold - Standing Isometric Shoulder Flexion with Doorway - Arm Bent  - 3 x daily - 1 sets - 10 reps - 3 sec hold - Standing Isometric Shoulder Internal Rotation at Doorway  - 3 x daily - 1 sets - 10 reps - 3 sec hold  ASSESSMENT:  CLINICAL IMPRESSION:  Patient arrived to physical therapy without reports of pain. Today's session with a main focus of improving GHJ stability and RTC  strengthening. Pt tolerated increase in intensity with stabilization exercises. Continued to progress increase in resistance with overhead endurance in order to facilitate return to job related tasks. Patient continues to demonstrate improvements with open kinetic chain stabilizations, yet he endorsed difficulty with fatigue while "using a drill". Future sessions to include progressing open kinetic chain activities. Time spent incorporating manual techniques in order to improve pain and stiffness in GHJ. Based on today's performance, Pt will continue to benefit from skilled Pt services to progress RUE strength and AROM to return to PLOF.   OBJECTIVE IMPAIRMENTS: decreased mobility, decreased ROM, decreased strength, impaired UE functional use, postural dysfunction, and pain.   ACTIVITY LIMITATIONS: carrying, lifting, bathing, toileting, dressing, reach over head, and hygiene/grooming  PARTICIPATION LIMITATIONS: cleaning,  community activity, occupation, and yard work  PERSONAL FACTORS: Age, Education, Fitness, Past/current experiences, Profession, and Time since onset of injury/illness/exacerbation are also affecting patient's functional outcome.   REHAB POTENTIAL: Good  CLINICAL DECISION MAKING: Stable/uncomplicated  EVALUATION COMPLEXITY: Low   GOALS: Goals reviewed with patient? No  SHORT TERM GOALS: Target date: 07/06/23 unless otherwise posted below  Pt will be independent with HEP to improve R shoulder AROM and strength within protocol parameters to improve functional mobility.  Baseline: 06/08/23: Provided initial HEP; 07/02/23: Independent with HEP progressing PROM.   Goal status: MET  2.  Pt will demonstrate R shoulder AROM in flexion to at least 120 degrees in supine for gradual return to overhead ADL completion. Baseline: 06/08/23: Phase 1 of current protocol (PROM); 08/05/23: 125 degrees Goal status: MET Target Date: 07/21/22 (8 weeks post op)  LONG TERM GOALS: Target date:  10/26/23 unless otherwise posted below  Pt will improve FOTO to target score to demonstrate clinically significant improvement in functional mobility. Baseline: 06/08/23: 47/68; 08/17/23: 40/98; 08/31/23: 64/68 Goal status: ON GOING  2.  Pt will demonstrate full, R shoulder AROM in all planes equal to LUE to demonstrate return to full capacity to complete ADL's.  Baseline: 06/08/23  Active ROM unless otherwise noted Right Eval PROM 11/26 Left eval Right  AROM Seated 08/17/23 Right  AROM Seated 08/31/23  Shoulder flexion 81 165 125 140  Shoulder extension  65    Shoulder abduction  149 91 115  Shoulder adduction      Shoulder internal rotation 55 90    Shoulder external rotation 20 65 60 60  Elbow flexion WNL WNL    Elbow extension 0 WNL    Wrist flexion WNL WNL    Wrist extension WNL WNL    Wrist ulnar deviation      Wrist radial deviation      Wrist pronation WNL WNL    Wrist supination WNL WNL    (Blank rows = not tested) Goal status: INITIAL  3.  Pt will be independent with updated HEP program including periscapular and RTC strength/stability related exercises to begin return to ability to complete work related and recreational related activity.   Baseline: 06/08/23: ; 08/17/23: independent with progressive protocol Goal status: MET  4.  Pt will report return to full work related tasks without strength or other reported functional deficits to demonstrate return to PLOF.  Baseline:  Goal status: INITIAL Target date: 11/28/22 (16 weeks post-op) PLAN:  PT FREQUENCY: 1-2x/week  PT DURATION: 8 weeks  PLANNED INTERVENTIONS: 97164- PT Re-evaluation, 97110-Therapeutic exercises, 97530- Therapeutic activity, 97112- Neuromuscular re-education, 97535- Self Care, 11914- Manual therapy, 97014- Electrical stimulation (unattended), [phone]- Electrical stimulation (manual), Patient/Family education, Dry Needling, Joint mobilization, Spinal manipulation, Spinal mobilization, Cryotherapy, Moist  heat, and Biofeedback  PLAN FOR NEXT SESSION: End range flexion and abduction with scapulohumeral rhythm. Global R shoulder strengthening. Thoracic rotation mimicking golf swing.  Christene Lye PT, DPT Physical Therapist- [facility]  4:02 PM, 10/07/23

## 2023-10-08 ENCOUNTER — Other Ambulatory Visit: Payer: Self-pay

## 2023-10-11 ENCOUNTER — Other Ambulatory Visit: Payer: Self-pay | Admitting: Family Medicine

## 2023-10-11 ENCOUNTER — Other Ambulatory Visit: Payer: Self-pay

## 2023-10-11 ENCOUNTER — Other Ambulatory Visit: Payer: Self-pay | Admitting: Pulmonary Disease

## 2023-10-11 ENCOUNTER — Ambulatory Visit: Payer: 59

## 2023-10-11 DIAGNOSIS — M25511 Pain in right shoulder: Secondary | ICD-10-CM | POA: Diagnosis not present

## 2023-10-11 DIAGNOSIS — M6281 Muscle weakness (generalized): Secondary | ICD-10-CM

## 2023-10-11 MED ORDER — ESCITALOPRAM OXALATE 10 MG PO TABS
10.0000 mg | ORAL_TABLET | Freq: Every day | ORAL | 1 refills | Status: DC
  Filled 2023-10-11: qty 90, 90d supply, fill #0
  Filled 2024-01-03: qty 90, 90d supply, fill #1
  Filled 2024-01-06: qty 90, 90d supply, fill #0

## 2023-10-11 MED FILL — Trazodone HCl Tab 50 MG: ORAL | 90 days supply | Qty: 90 | Fill #2 | Status: AC

## 2023-10-11 MED FILL — Budesonide-Formoterol Fumarate Dihyd Aerosol 160-4.5 MCG/ACT: RESPIRATORY_TRACT | 30 days supply | Qty: 10.2 | Fill #1 | Status: CN

## 2023-10-11 MED FILL — Trazodone HCl Tab 50 MG: ORAL | 90 days supply | Qty: 90 | Fill #2 | Status: CN

## 2023-10-11 MED FILL — Budesonide-Formoterol Fumarate Dihyd Aerosol 160-4.5 MCG/ACT: RESPIRATORY_TRACT | 30 days supply | Qty: 10.2 | Fill #1 | Status: AC

## 2023-10-11 NOTE — Therapy (Signed)
 OUTPATIENT PHYSICAL THERAPY SHOULDER TREATMENT  Patient Name: Austin Houston MRN: [id number] DOB:01/13/1961, 63 y.o., male Today's Date: 10/11/2023  END OF SESSION:  PT End of Session - 10/11/23 1604     Visit Number 22    Number of Visits 25    Date for PT Re-Evaluation 10/26/23    Authorization Type [facility]    Progress Note Due on Visit 10    PT Start Time 1604    PT Stop Time 1645    PT Time Calculation (min) 41 min    Activity Tolerance Patient tolerated treatment well    Behavior During Therapy WFL for tasks assessed/performed             Past Medical History:  Diagnosis Date   Actinic keratosis    Allergic rhinitis due to pollen    Asthma    vs COPD- taking inhalers- as related to allergies only.   Basal cell carcinoma 03/26/2020   R lat deltoid - ED&C    Dysplastic nevus 03/07/2020   L costal infrapectoral - moderate   Dysplastic nevus 03/07/2020   R post flank above waistline - moderate   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HCC)    s/p partial thyroidectomy (follicular adenoma)-surgery only   GERD (gastroesophageal reflux disease)    Hearing impaired person, bilateral    hearing aida bilateral"high frequency loss   Hyperlipidemia    Hypertension    Prostate cancer (HCC) 01/08/2016   PTSD (post-traumatic stress disorder)    s/p multiple active deployments for [employer], no medications taken currently   Past Surgical History:  Procedure Laterality Date   COLONOSCOPY W/ POLYPECTOMY     age 63 "adenoma"   INGUINAL HERNIA REPAIR     right   LEFT HEART CATH AND CORONARY ANGIOGRAPHY N/A 07/20/2022   Procedure: LEFT HEART CATH AND CORONARY ANGIOGRAPHY;  Surgeon: Tonny Bollman, MD;  Location: [facility] INVASIVE CV LAB;  Service: Cardiovascular;  Laterality: N/A;   REPLACEMENT ASCENDING AORTA N/A 09/07/2022   Procedure: REPLACEMENT ASCENDING AORTA;  Surgeon: Alleen Borne, MD;  Location: [facility] OR;  Service: Open Heart Surgery;  Laterality: N/A;   WITH CIRC ARREST   ROBOT ASSISTED LAPAROSCOPIC RADICAL PROSTATECTOMY N/A 07/23/2016   Procedure: XI ROBOTIC ASSISTED LAPAROSCOPIC RADICAL PROSTATECTOMY LEVEL 1;  Surgeon: Heloise Purpura, MD;  Location: [facility] ORS;  Service: Urology;  Laterality: N/A;   SHOULDER ARTHROSCOPY WITH ROTATOR CUFF REPAIR AND SUBACROMIAL DECOMPRESSION Right 05/27/2023   Procedure: SHOULDER ARTHROSCOPY WITH ROTATOR CUFF REPAIR AND SUBACROMIAL DECOMPRESSION;  Surgeon: Jones Broom, MD;  Location: [facility] ORS;  Service: Orthopedics;  Laterality: Right;  NEEDS 90 MINUTES   TEE WITHOUT CARDIOVERSION N/A 09/07/2022   Procedure: TRANSESOPHAGEAL ECHOCARDIOGRAM (TEE);  Surgeon: Alleen Borne, MD;  Location: [facility] OR;  Service: Open Heart Surgery;  Laterality: N/A;   THYROIDECTOMY, PARTIAL     [facility])   Patient Active Problem List   Diagnosis Date Noted   Anxiety and depression 02/21/2023   Pancytopenia (HCC) 02/21/2023   Elevated liver enzymes 02/21/2023   Alcohol use disorder, severe 02/16/2023   S/P ascending aortic aneurysm repair 09/07/2022   Aneurysm of ascending aorta without rupture (HCC) 07/20/2022   Mild persistent asthma without complication 01/24/2020   Prostate cancer (HCC) 01/08/2016   Varicose veins of bilateral lower extremities with other complications 03/01/2013   Former smokeless tobacco use 11/25/2012   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HCC)    GERD (gastroesophageal reflux disease)    Allergic rhinitis due  to pollen    Hypertension    Hyperlipidemia    PTSD (post-traumatic stress disorder)     PCP: Hannah Beat, MD  REFERRING PROVIDER: Berline Lopes  REFERRING DIAG: RIGHT SHOULDER ROTATOR CUFF TEAR   THERAPY DIAG:  Acute pain of right shoulder  Muscle weakness (generalized)  Rationale for Evaluation and Treatment: Rehabilitation  ONSET DATE: 05/27/23  SUBJECTIVE:                                                                                                                                                                                       SUBJECTIVE STATEMENT: Patient reported that manual therapy in last session improved pain and stiffness. Patient reports consistent (6-7/10 NPS) in the AM after a normal day of ADLs.  Currently no pain in right shoulder.  Hand dominance: Right  PERTINENT HISTORY: Pt is a pleasant 63 y.o. male referred to OPPT s/p R RTC tear repair and subacromial decompression 05/27/23. Pt reports having a fall at work in the [employer] onto his R shoulder. Happened December 5th, 2023. Waited for his RTC surgery as he had open heart surgery for ascending aortic aneurysm in February 2024. Pain is minimal, controlled via Tylenol. R handed. Pt works in [employer] lab at [employer] requiring BUE use such as mobilizing patients, transfers, and use of equipment for performing catheterizations.   PAIN:  Are you having pain? None on arrival; noted soreness during end-range stretches and soreness at end of session.   PRECAUTIONS: Other: RTC repair protocol  Weeks Six-Twelve Precautions: -no lifting -no supporting BW with hands and arms -no sudden jerking -no excessive behind the back movements -avoid upper extremity ergometer    RED FLAGS: None   WEIGHT BEARING RESTRICTIONS: No  FALLS:  Has patient fallen in last 6 months? No  LIVING ENVIRONMENT: Lives with: lives with their spouse Lives in: House/apartment  OCCUPATION: Cardiovascular Specialist  PLOF: Independent  PATIENT GOALS:Return to occupation, return to PLOF.  NEXT MD VISIT: N/A  OBJECTIVE:  Note: Objective measures were completed at Evaluation unless otherwise noted.  DIAGNOSTIC FINDINGS:  N/A  PATIENT SURVEYS:  FOTO 47/68  COGNITION: Overall cognitive status: Within functional limits for tasks assessed     SENSATION: WFL  POSTURE: Mildly rounded shoulders  CERVICAL AROM: Flexion: 70 Extension: 55 Rotation R/L: 60/70 Lateral flexion R/L: 45/45  UPPER  EXTREMITY ROM:   Active ROM unless otherwise noted Right Eval PROM 11/26 Left eval  Shoulder flexion 81 165  Shoulder extension  65  Shoulder abduction  149  Shoulder adduction    Shoulder internal rotation 55 90  Shoulder external rotation 20 65  Elbow  flexion WNL WNL  Elbow extension 0 WNL  Wrist flexion WNL WNL  Wrist extension WNL WNL  Wrist ulnar deviation    Wrist radial deviation    Wrist pronation WNL WNL  Wrist supination WNL WNL  (Blank rows = not tested)  UPPER EXTREMITY MMT:  MMT Right Eval Left Eval  Shoulder flexion deferred 5  Shoulder extension deferred   Shoulder abduction deferred 5  Shoulder adduction    Shoulder internal rotation deferred 5  Shoulder external rotation deferred 5  Middle trapezius    Lower trapezius    Elbow flexion  5  Elbow extension  4 (seated)   TODAY'S TREATMENT:                                                                                                                                         DATE: 10/11/23  There.ex:   Standing Shoulder Flexion to 90 with 5# DB 3 x 15;    High Row against Blue TB (Shoulder Abd at 90) 3x15; multi modal cues provided in order to improve technique and form  T bar Row on RUE only with Olympic Bar against wall with 2 x10# plates;   Time spent Education on rotator cuff sleeper stretch in sidelying and self AAROM into R shoulder external rotation with pole;    There.act:   Standing Rhythmic Stabilization Shoulder Abd 90 with 3# MB against the wall 2 x 1 min;  Standing Pronation/Supination with 6# DB at 90 shoulder flexion: 2x8 ea direction  Incline push ups on reverse bosu on mat table at highest height: 2x10 (added perturbations to bosu during up phase)    Manual Therapy: 10 minutes supine, Used for improving R shoulder stiffness post exercise and for pain modulation. Gentle GHJ Oscillations in between sets   Supine R Shoulder external rotation with PT OP for GHJ mobility 2 x 30    Supine R shoulder abduction at 90 deg AP grade 3 mobs for GHJ mobility 3x30 sec   Supine R shoulder abduction at 90 deg inferior grade 3 mobs for GHJ mobility 3x30 sec   PATIENT EDUCATION: Education details: Updated precautions/restrictions/HEP per phase 2 protocol Person educated: Patient Education method: Explanation Education comprehension: verbalized understanding, returned demonstration, verbal cues required, and tactile cues required  HOME EXERCISE PROGRAM: Access Code: [email or web address] URL: [email or web address] Date: 08/31/2023 Prepared by: Ronnie Derby  Exercises - Seated Shoulder Abduction with Bent Elbow  - 1 x daily - 3-4 x weekly - 2 sets - 12 reps - Prone Shoulder Row  - 3 x daily - 1 sets - 10 reps - Supine Shoulder Flexion Extension AAROM with Dowel  - 1 x daily - 4-5 x weekly - 3 sets - 12 reps - Prone Shoulder Row  - 1 x daily - 3 x weekly - 2 sets - 12 reps - Prone Shoulder Extension - Single Arm  - 1  x daily - 3 x weekly - 2 sets - 12 reps - Shoulder Internal Rotation with Resistance  - 1 x daily - 2-3 x weekly - 3 sets - 12 reps - Standing Row with Resistance with Anchored Resistance at Chest Height Palms Down  - 1 x daily - 2-3 x weekly - 3 sets - 8 reps - Single Arm Scaption with Resistance  - 1 x daily - 3-4 x weekly - 3 sets - 8 reps - Standing Shoulder Horizontal Abduction with Resistance  - 1 x daily - 3-4 x weekly - 3 sets - 8 reps - Shoulder External Rotation with Anchored Resistance  - 1 x daily - 3-4 x weekly - 3 sets - 8 reps   Access Code: [email or web address] URL: [email or web address] Date: 08/17/2023 Prepared by: Ronnie Derby  Exercises - Seated Single Arm Shoulder Scaption  - 1 x daily - 3-4 x weekly - 2 sets - 12 reps - Seated Shoulder External Rotation  - 1 x daily - 3-4 x weekly - 2 sets - 12 reps - Seated Shoulder Abduction with Bent Elbow  - 1 x daily - 3-4 x weekly - 2 sets - 12 reps - Prone Shoulder Row  - 3 x daily - 1  sets - 10 reps - Supine Shoulder Flexion Extension AAROM with Dowel  - 1 x daily - 4-5 x weekly - 3 sets - 12 reps - Sidelying Shoulder External Rotation  - 1 x daily - 3 x weekly - 2 sets - 12 reps - Prone Shoulder Row  - 1 x daily - 3 x weekly - 2 sets - 12 reps - Prone Shoulder Extension - Single Arm  - 1 x daily - 3 x weekly - 2 sets - 12 reps - Shoulder Internal Rotation with Resistance  - 1 x daily - 2-3 x weekly - 3 sets - 12 reps - Shoulder External Rotation and Scapular Retraction with Resistance  - 1 x daily - 2-3 x weekly - 3 sets - 12 reps - Standing Row with Resistance with Anchored Resistance at Chest Height Palms Down  - 1 x daily - 2-3 x weekly - 3 sets - 8 reps   Access Code: [email or web address] URL: [email or web address] Date: 08/11/2023 Prepared by: Ronnie Derby  Exercises - Seated Single Arm Shoulder Scaption  - 1 x daily - 3-4 x weekly - 2 sets - 12 reps - Seated Shoulder External Rotation  - 1 x daily - 3-4 x weekly - 2 sets - 12 reps - Seated Shoulder Abduction with Bent Elbow  - 1 x daily - 3-4 x weekly - 2 sets - 12 reps - Prone Shoulder Row  - 3 x daily - 1 sets - 10 reps - Supine Shoulder Flexion Extension AAROM with Dowel  - 1 x daily - 4-5 x weekly - 3 sets - 12 reps - Sidelying Shoulder External Rotation  - 1 x daily - 3 x weekly - 2 sets - 12 reps - Prone Shoulder Row  - 1 x daily - 3 x weekly - 2 sets - 12 reps - Prone Shoulder Extension - Single Arm  - 1 x daily - 3 x weekly - 2 sets - 12 reps  Access Code: [email or web address] URL: [email or web address] Date: 08/05/2023 Prepared by: Ronnie Derby  Exercises - Prone Shoulder Row  - 3 x daily - 1 sets - 10 reps - Isometric Shoulder Adduction  - 3 x daily - 1 sets - 10 reps - 3  sec hold - Isometric Shoulder Abduction at Wall  - 3 x daily - 1 sets - 10 reps - 3 sec hold - Standing Isometric Shoulder External Rotation with Doorway  - 3 x daily - 1 sets - 10 reps - 3 sec hold - Standing Isometric  Shoulder Flexion with Doorway - Arm Bent  - 3 x daily - 1 sets - 10 reps - 3 sec hold - Standing Isometric Shoulder Internal Rotation at Doorway  - 3 x daily - 1 sets - 10 reps - 3 sec hold - Supine Shoulder Flexion Extension AAROM with Dowel  - 1 x daily - 4-5 x weekly - 3 sets - 12 reps - Sidelying Shoulder External Rotation  - 1 x daily - 3 x weekly - 2 sets - 12 reps - Prone Shoulder Row  - 1 x daily - 3 x weekly - 2 sets - 12 reps - Prone Shoulder Extension - Single Arm  - 1 x daily - 3 x weekly - 2 sets - 12 reps - Prone Shoulder Horizontal Abduction  - 1 x daily - 3 x weekly - 2 sets - 12 reps - Supine Shoulder Flexion Extension Full Range AROM  - 1 x daily - 7 x weekly - 2 sets - 15 reps - Supine Shoulder Abduction AROM  - 1 x daily - 7 x weekly - 2 sets - 15 reps   Access Code: [email or web address] URL: [email or web address] Date: 07/09/2023 Prepared by: Alvera Novel  Exercises - Seated Scapular Retraction  - 3 x daily - 1 sets - 15 reps - Seated Shoulder Shrugs  - 3 x daily - 1 sets - 15 reps - Seated Shoulder Flexion Towel Slide at Table Top  - 3 x daily - 1 sets - 15-20 reps - Seated Shoulder Abduction Towel Slide at Table Top  - 1 x daily - 1 sets - 15-20 reps - Prone Shoulder Row  - 3 x daily - 1 sets - 10 reps - Isometric Shoulder Adduction  - 3 x daily - 1 sets - 10 reps - 3 sec hold - Isometric Shoulder Abduction at Wall  - 3 x daily - 1 sets - 10 reps - 3 sec hold - Standing Isometric Shoulder External Rotation with Doorway  - 3 x daily - 1 sets - 10 reps - 3 sec hold - Standing Isometric Shoulder Flexion with Doorway - Arm Bent  - 3 x daily - 1 sets - 10 reps - 3 sec hold - Standing Isometric Shoulder Internal Rotation at Doorway  - 3 x daily - 1 sets - 10 reps - 3 sec hold  ASSESSMENT:  CLINICAL IMPRESSION: Patient arrived to physical therapy with a main focus on improving GHJ stability and strengthening. Patient tolerated increase in resistance with T-bar row  exercise targeting posterior shoulder strength. He continues to demonstrate improvements with GHJ mobility without report of additional pain. Educated patient on stretches for shoulder mobility and to reduce pain in the AM; good return demonstration from patient. Time spent incorporating manual techniques in order to improve pain and stiffness in GHJ. Based on today's performance, Pt will continue to benefit from skilled Pt services to progress RUE strength and AROM to return to PLOF.   OBJECTIVE IMPAIRMENTS: decreased mobility, decreased ROM, decreased strength, impaired UE functional use, postural dysfunction, and pain.   ACTIVITY LIMITATIONS: carrying, lifting, bathing, toileting, dressing, reach over head, and hygiene/grooming  PARTICIPATION LIMITATIONS: cleaning, community activity, occupation, and yard work  PERSONAL  FACTORS: Age, Education, Fitness, Past/current experiences, Profession, and Time since onset of injury/illness/exacerbation are also affecting patient's functional outcome.   REHAB POTENTIAL: Good  CLINICAL DECISION MAKING: Stable/uncomplicated  EVALUATION COMPLEXITY: Low   GOALS: Goals reviewed with patient? No  SHORT TERM GOALS: Target date: 07/06/23 unless otherwise posted below  Pt will be independent with HEP to improve R shoulder AROM and strength within protocol parameters to improve functional mobility.  Baseline: 06/08/23: Provided initial HEP; 07/02/23: Independent with HEP progressing PROM.   Goal status: MET  2.  Pt will demonstrate R shoulder AROM in flexion to at least 120 degrees in supine for gradual return to overhead ADL completion. Baseline: 06/08/23: Phase 1 of current protocol (PROM); 08/05/23: 125 degrees Goal status: MET Target Date: 07/21/22 (8 weeks post op)  LONG TERM GOALS: Target date: 10/26/23 unless otherwise posted below  Pt will improve FOTO to target score to demonstrate clinically significant improvement in functional  mobility. Baseline: 06/08/23: 47/68; 08/17/23: 65/78; 08/31/23: 64/68 Goal status: ON GOING  2.  Pt will demonstrate full, R shoulder AROM in all planes equal to LUE to demonstrate return to full capacity to complete ADL's.  Baseline: 06/08/23  Active ROM unless otherwise noted Right Eval PROM 11/26 Left eval Right  AROM Seated 08/17/23 Right  AROM Seated 08/31/23  Shoulder flexion 81 165 125 140  Shoulder extension  65    Shoulder abduction  149 91 115  Shoulder adduction      Shoulder internal rotation 55 90    Shoulder external rotation 20 65 60 60  Elbow flexion WNL WNL    Elbow extension 0 WNL    Wrist flexion WNL WNL    Wrist extension WNL WNL    Wrist ulnar deviation      Wrist radial deviation      Wrist pronation WNL WNL    Wrist supination WNL WNL    (Blank rows = not tested) Goal status: INITIAL  3.  Pt will be independent with updated HEP program including periscapular and RTC strength/stability related exercises to begin return to ability to complete work related and recreational related activity.   Baseline: 06/08/23: ; 08/17/23: independent with progressive protocol Goal status: MET  4.  Pt will report return to full work related tasks without strength or other reported functional deficits to demonstrate return to PLOF.  Baseline:  Goal status: INITIAL Target date: 11/28/22 (16 weeks post-op) PLAN:  PT FREQUENCY: 1-2x/week  PT DURATION: 8 weeks  PLANNED INTERVENTIONS: 97164- PT Re-evaluation, 97110-Therapeutic exercises, 97530- Therapeutic activity, 97112- Neuromuscular re-education, 97535- Self Care, [id number]- Manual therapy, 97014- Electrical stimulation (unattended), [id number]- Electrical stimulation (manual), Patient/Family education, Dry Needling, Joint mobilization, Spinal manipulation, Spinal mobilization, Cryotherapy, Moist heat, and Biofeedback  PLAN FOR NEXT SESSION: End range flexion and abduction with scapulohumeral rhythm. Global R shoulder strengthening.    Christene Lye PT, DPT Physical Therapist- [facility]  4:06 PM, 10/11/23

## 2023-10-12 ENCOUNTER — Other Ambulatory Visit: Payer: Self-pay

## 2023-10-12 ENCOUNTER — Other Ambulatory Visit: Payer: Self-pay | Admitting: Family Medicine

## 2023-10-12 MED FILL — Montelukast Sodium Tab 10 MG (Base Equiv): ORAL | 90 days supply | Qty: 90 | Fill #0 | Status: AC

## 2023-10-12 NOTE — Telephone Encounter (Signed)
 Last refilled by Dr. Chilton Greathouse.  Refill?

## 2023-10-20 ENCOUNTER — Ambulatory Visit: Payer: Worker's Compensation | Attending: Orthopedic Surgery

## 2023-10-20 DIAGNOSIS — M6281 Muscle weakness (generalized): Secondary | ICD-10-CM | POA: Diagnosis present

## 2023-10-20 DIAGNOSIS — M25511 Pain in right shoulder: Secondary | ICD-10-CM | POA: Diagnosis present

## 2023-10-20 NOTE — Therapy (Signed)
 OUTPATIENT PHYSICAL THERAPY SHOULDER TREATMENT  Patient Name: HAZAEL OLVEDA MRN: [id number] DOB:1960/09/29, 63 y.o., male Today's Date: 10/20/2023  END OF SESSION:  PT End of Session - 10/20/23 1300     Visit Number 22    Number of Visits 25    Date for PT Re-Evaluation 10/26/23    Authorization Type [facility]    Progress Note Due on Visit 10    PT Start Time 1300    PT Stop Time 1340    PT Time Calculation (min) 40 min    Activity Tolerance Patient tolerated treatment well    Behavior During Therapy WFL for tasks assessed/performed             Past Medical History:  Diagnosis Date   Actinic keratosis    Allergic rhinitis due to pollen    Asthma    vs COPD- taking inhalers- as related to allergies only.   Basal cell carcinoma 03/26/2020   R lat deltoid - ED&C    Dysplastic nevus 03/07/2020   L costal infrapectoral - moderate   Dysplastic nevus 03/07/2020   R post flank above waistline - moderate   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HCC)    s/p partial thyroidectomy (follicular adenoma)-surgery only   GERD (gastroesophageal reflux disease)    Hearing impaired person, bilateral    hearing aida bilateral"high frequency loss   Hyperlipidemia    Hypertension    Prostate cancer (HCC) 01/08/2016   PTSD (post-traumatic stress disorder)    s/p multiple active deployments for [employer], no medications taken currently   Past Surgical History:  Procedure Laterality Date   COLONOSCOPY W/ POLYPECTOMY     age 48 "adenoma"   INGUINAL HERNIA REPAIR     right   LEFT HEART CATH AND CORONARY ANGIOGRAPHY N/A 07/20/2022   Procedure: LEFT HEART CATH AND CORONARY ANGIOGRAPHY;  Surgeon: Tonny Bollman, MD;  Location: [facility] INVASIVE CV LAB;  Service: Cardiovascular;  Laterality: N/A;   REPLACEMENT ASCENDING AORTA N/A 09/07/2022   Procedure: REPLACEMENT ASCENDING AORTA;  Surgeon: Alleen Borne, MD;  Location: [facility] OR;  Service: Open Heart Surgery;  Laterality: N/A;  WITH  CIRC ARREST   ROBOT ASSISTED LAPAROSCOPIC RADICAL PROSTATECTOMY N/A 07/23/2016   Procedure: XI ROBOTIC ASSISTED LAPAROSCOPIC RADICAL PROSTATECTOMY LEVEL 1;  Surgeon: Heloise Purpura, MD;  Location: [facility] ORS;  Service: Urology;  Laterality: N/A;   SHOULDER ARTHROSCOPY WITH ROTATOR CUFF REPAIR AND SUBACROMIAL DECOMPRESSION Right 05/27/2023   Procedure: SHOULDER ARTHROSCOPY WITH ROTATOR CUFF REPAIR AND SUBACROMIAL DECOMPRESSION;  Surgeon: Jones Broom, MD;  Location: [facility] ORS;  Service: Orthopedics;  Laterality: Right;  NEEDS 90 MINUTES   TEE WITHOUT CARDIOVERSION N/A 09/07/2022   Procedure: TRANSESOPHAGEAL ECHOCARDIOGRAM (TEE);  Surgeon: Alleen Borne, MD;  Location: [facility] OR;  Service: Open Heart Surgery;  Laterality: N/A;   THYROIDECTOMY, PARTIAL     [facility])   Patient Active Problem List   Diagnosis Date Noted   Anxiety and depression 02/21/2023   Pancytopenia (HCC) 02/21/2023   Elevated liver enzymes 02/21/2023   Alcohol use disorder, severe 02/16/2023   S/P ascending aortic aneurysm repair 09/07/2022   Aneurysm of ascending aorta without rupture (HCC) 07/20/2022   Mild persistent asthma without complication 01/24/2020   Prostate cancer (HCC) 01/08/2016   Varicose veins of bilateral lower extremities with other complications 03/01/2013   Former smokeless tobacco use 11/25/2012   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HCC)    GERD (gastroesophageal reflux disease)    Allergic rhinitis due  to pollen    Hypertension    Hyperlipidemia    PTSD (post-traumatic stress disorder)     PCP: Hannah Beat, MD  REFERRING PROVIDER: Berline Lopes  REFERRING DIAG: RIGHT SHOULDER ROTATOR CUFF TEAR   THERAPY DIAG:  Acute pain of right shoulder  Muscle weakness (generalized)  Rationale for Evaluation and Treatment: Rehabilitation  ONSET DATE: 05/27/23  SUBJECTIVE:                                                                                                                                                                                       SUBJECTIVE STATEMENT: Patient reports continued pain that disrupts sleep in R shoulder. Still consistently 6-7/10 in AM.  Currently no pain in right shoulder.  Hand dominance: Right  PERTINENT HISTORY: Pt is a pleasant 63 y.o. male referred to OPPT s/p R RTC tear repair and subacromial decompression 05/27/23. Pt reports having a fall at work in the [employer] onto his R shoulder. Happened December 5th, 2023. Waited for his RTC surgery as he had open heart surgery for ascending aortic aneurysm in February 2024. Pain is minimal, controlled via Tylenol. R handed. Pt works in [employer] lab at [employer] requiring BUE use such as mobilizing patients, transfers, and use of equipment for performing catheterizations.   PAIN:  Are you having pain? None on arrival; noted soreness during end-range stretches and soreness at end of session.   PRECAUTIONS: Other: RTC repair protocol  Weeks Six-Twelve Precautions: -no lifting -no supporting BW with hands and arms -no sudden jerking -no excessive behind the back movements -avoid upper extremity ergometer    RED FLAGS: None   WEIGHT BEARING RESTRICTIONS: No  FALLS:  Has patient fallen in last 6 months? No  LIVING ENVIRONMENT: Lives with: lives with their spouse Lives in: House/apartment  OCCUPATION: Cardiovascular Specialist  PLOF: Independent  PATIENT GOALS:Return to occupation, return to PLOF.  NEXT MD VISIT: N/A  OBJECTIVE:  Note: Objective measures were completed at Evaluation unless otherwise noted.  DIAGNOSTIC FINDINGS:  N/A  PATIENT SURVEYS:  FOTO 47/68  COGNITION: Overall cognitive status: Within functional limits for tasks assessed     SENSATION: WFL  POSTURE: Mildly rounded shoulders  CERVICAL AROM: Flexion: 70 Extension: 55 Rotation R/L: 60/70 Lateral flexion R/L: 45/45  UPPER EXTREMITY ROM:   Active ROM unless otherwise noted Right Eval PROM  11/26 Left eval  Shoulder flexion 81 165  Shoulder extension  65  Shoulder abduction  149  Shoulder adduction    Shoulder internal rotation 55 90  Shoulder external rotation 20 65  Elbow flexion WNL WNL  Elbow extension 0 WNL  Wrist flexion  WNL WNL  Wrist extension WNL WNL  Wrist ulnar deviation    Wrist radial deviation    Wrist pronation WNL WNL  Wrist supination WNL WNL  (Blank rows = not tested)  UPPER EXTREMITY MMT:  MMT Right Eval Left Eval  Shoulder flexion deferred 5  Shoulder extension deferred   Shoulder abduction deferred 5  Shoulder adduction    Shoulder internal rotation deferred 5  Shoulder external rotation deferred 5  Middle trapezius    Lower trapezius    Elbow flexion  5  Elbow extension  4 (seated)   TODAY'S TREATMENT:                                                                                                                                         DATE: 10/20/23  There.ex:   Omega:   Lat Pulldown 2 x 15 65#   Face Pull  2 x 10 10 #   Half-Kneeling Bottoms Up ONEOK (Knee on airex):  2 x 8 with 20# KB    PT with hands around KB in order to control major perturbations from weight  PT demo with Pt return demonstration:   Doorway Stretch from 60 to 90 of GHJ Abd:    2 x 30s at 60 of abd   2 x 30s at 90 of abd   - VC for neutral pelvic alignment and less rotation in order to feel pectoral stretch   R Sidelying Sleeper Stretch with self OP    2 x 30s   - VC for OP to be placed at distal forearm rather than hand   There.act:    Standing CCW/CW Wall Circles against green ball: 1 min x ea direction   Shoulder Taps from Push Position with Mat Table At Highest Height:   1 x 10 (Too easy)  Shoulder Taps from Push Up Position on Mat Table:   1 x 10 in Quadraped   1 x 8 from Standard Push Up Pos  1 x 15 from Standard Push Up Pos (Spontaneous additional repetitions from pt)  10# KB Bottom Up Carry x 44m:  2  Laps with 10# KB  2 Laps with 20# KB   Manual Therapy: 10 minutes supine, Used for improving R shoulder stiffness post exercise and for pain modulation. Gentle GHJ Oscillations in between sets   Supine R Shoulder external rotation with PT OP for GHJ mobility 3 x 30s   Supine R shoulder abduction at 90 deg AP grade IV mobs for GHJ mobility 3 x 30 sec   Supine R shoulder abduction at 90 deg inferior grade IV mobs for GHJ mobility 3 x 30 sec   Pt endorsed improved shoulder abd. ROM without "catching" or pain following manual techniques   PATIENT EDUCATION: Education details: Updated precautions/restrictions/HEP per phase 2 protocol Person educated: Patient Education method: Explanation Education comprehension: verbalized understanding, returned  demonstration, verbal cues required, and tactile cues required  HOME EXERCISE PROGRAM: Access Code: [email or web address] URL: [email or web address] Date: 10/20/2023 Prepared by: Cristal Deer Chena Chohan  Exercises - Seated Shoulder Abduction with Bent Elbow  - 1 x daily - 3-4 x weekly - 2 sets - 12 reps - Prone Shoulder Row  - 3 x daily - 1 sets - 10 reps - Supine Shoulder Flexion Extension AAROM with Dowel  - 1 x daily - 4-5 x weekly - 3 sets - 12 reps - Prone Shoulder Row  - 1 x daily - 3 x weekly - 2 sets - 12 reps - Prone Shoulder Extension - Single Arm  - 1 x daily - 3 x weekly - 2 sets - 12 reps - Shoulder Internal Rotation with Resistance  - 1 x daily - 2-3 x weekly - 3 sets - 12 reps - Standing Row with Resistance with Anchored Resistance at Chest Height Palms Down  - 1 x daily - 2-3 x weekly - 3 sets - 8 reps - Single Arm Scaption with Resistance  - 1 x daily - 3-4 x weekly - 3 sets - 8 reps - Standing Shoulder Horizontal Abduction with Resistance  - 1 x daily - 3-4 x weekly - 3 sets - 8 reps - Shoulder External Rotation with Anchored Resistance  - 1 x daily - 3-4 x weekly - 3 sets - 8 reps - Sleeper Stretch  - 1-2 x daily - 7 x weekly - 3  sets - 30-60s hold - Single Arm Doorway Pec Stretch at 90 Degrees Abduction  - 1-2 x daily - 7 x weekly - 3 sets - 30-60s hold - Doorway Pec Stretch at 60 Degrees Abduction with Arm Straight  - 1-2 x daily - 7 x weekly - 3 sets - 30-60s hold  Access Code: [email or web address] URL: [email or web address] Date: 08/31/2023 Prepared by: Ronnie Derby  Exercises - Seated Shoulder Abduction with Bent Elbow  - 1 x daily - 3-4 x weekly - 2 sets - 12 reps - Prone Shoulder Row  - 3 x daily - 1 sets - 10 reps - Supine Shoulder Flexion Extension AAROM with Dowel  - 1 x daily - 4-5 x weekly - 3 sets - 12 reps - Prone Shoulder Row  - 1 x daily - 3 x weekly - 2 sets - 12 reps - Prone Shoulder Extension - Single Arm  - 1 x daily - 3 x weekly - 2 sets - 12 reps - Shoulder Internal Rotation with Resistance  - 1 x daily - 2-3 x weekly - 3 sets - 12 reps - Standing Row with Resistance with Anchored Resistance at Chest Height Palms Down  - 1 x daily - 2-3 x weekly - 3 sets - 8 reps - Single Arm Scaption with Resistance  - 1 x daily - 3-4 x weekly - 3 sets - 8 reps - Standing Shoulder Horizontal Abduction with Resistance  - 1 x daily - 3-4 x weekly - 3 sets - 8 reps - Shoulder External Rotation with Anchored Resistance  - 1 x daily - 3-4 x weekly - 3 sets - 8 reps  ASSESSMENT:  CLINICAL IMPRESSION: Pt arrived to OPPT with a continued focus on GHJ stability and parascapular strengthening. Pt tolerated increased intensity with GHJ stabilization exercises today. SBA provided with Kettlebell exercises in order to control external perturbations from KB weight. Extensive time spent educating patient on proper stretch protocol for anterior and posterior shoulder musculature. Continued manual techniques for  pain modulation and improved GHJ mobility; pt able to perform full AROM of GHJ abd.without report of additional pain or catching sensation. Based on today's performance, Pt will continue to benefit from skilled Pt  services to progress RUE strength and AROM to return to PLOF.  OBJECTIVE IMPAIRMENTS: decreased mobility, decreased ROM, decreased strength, impaired UE functional use, postural dysfunction, and pain.   ACTIVITY LIMITATIONS: carrying, lifting, bathing, toileting, dressing, reach over head, and hygiene/grooming  PARTICIPATION LIMITATIONS: cleaning, community activity, occupation, and yard work  PERSONAL FACTORS: Age, Education, Fitness, Past/current experiences, Profession, and Time since onset of injury/illness/exacerbation are also affecting patient's functional outcome.   REHAB POTENTIAL: Good  CLINICAL DECISION MAKING: Stable/uncomplicated  EVALUATION COMPLEXITY: Low   GOALS: Goals reviewed with patient? No  SHORT TERM GOALS: Target date: 07/06/23 unless otherwise posted below  Pt will be independent with HEP to improve R shoulder AROM and strength within protocol parameters to improve functional mobility.  Baseline: 06/08/23: Provided initial HEP; 07/02/23: Independent with HEP progressing PROM.   Goal status: MET  2.  Pt will demonstrate R shoulder AROM in flexion to at least 120 degrees in supine for gradual return to overhead ADL completion. Baseline: 06/08/23: Phase 1 of current protocol (PROM); 08/05/23: 125 degrees Goal status: MET Target Date: 07/21/22 (8 weeks post op)  LONG TERM GOALS: Target date: 10/26/23 unless otherwise posted below  Pt will improve FOTO to target score to demonstrate clinically significant improvement in functional mobility. Baseline: 06/08/23: 47/68; 08/17/23: 16/10; 08/31/23: 64/68 Goal status: Deferred  2.  Pt will demonstrate full, R shoulder AROM in all planes equal to LUE to demonstrate return to full capacity to complete ADL's.  Baseline: 06/08/23  Active ROM unless otherwise noted Right Eval PROM 11/26 Left eval Right  AROM Seated 08/17/23 Right  AROM Seated 08/31/23  Shoulder flexion 81 165 125 140  Shoulder extension  65     Shoulder abduction  149 91 115  Shoulder adduction      Shoulder internal rotation 55 90    Shoulder external rotation 20 65 60 60  Elbow flexion WNL WNL    Elbow extension 0 WNL    Wrist flexion WNL WNL    Wrist extension WNL WNL    Wrist ulnar deviation      Wrist radial deviation      Wrist pronation WNL WNL    Wrist supination WNL WNL    (Blank rows = not tested) Goal status: INITIAL  3.  Pt will be independent with updated HEP program including periscapular and RTC strength/stability related exercises to begin return to ability to complete work related and recreational related activity.   Baseline: 06/08/23: ; 08/17/23: independent with progressive protocol Goal status: MET  4.  Pt will report return to full work related tasks without strength or other reported functional deficits to demonstrate return to PLOF.  Baseline:  Goal status: INITIAL Target date: 11/28/22 (16 weeks post-op) PLAN:  PT FREQUENCY: 1-2x/week  PT DURATION: 8 weeks  PLANNED INTERVENTIONS: 97164- PT Re-evaluation, 97110-Therapeutic exercises, 97530- Therapeutic activity, 97112- Neuromuscular re-education, 97535- Self Care, 96045- Manual therapy, 97014- Electrical stimulation (unattended), [id number]- Electrical stimulation (manual), Patient/Family education, Dry Needling, Joint mobilization, Spinal manipulation, Spinal mobilization, Cryotherapy, Moist heat, and Biofeedback  PLAN FOR NEXT SESSION: End range flexion and abduction with scapulohumeral rhythm. Global R shoulder strengthening. GHJ stabilization exercises  Christene Lye PT, DPT Physical Therapist- [place]  [facility] [place]  1:01 PM, 10/20/23

## 2023-10-27 ENCOUNTER — Ambulatory Visit: Admitting: Surgery

## 2023-10-27 ENCOUNTER — Ambulatory Visit

## 2023-10-27 DIAGNOSIS — M25511 Pain in right shoulder: Secondary | ICD-10-CM

## 2023-10-27 DIAGNOSIS — M6281 Muscle weakness (generalized): Secondary | ICD-10-CM

## 2023-10-30 ENCOUNTER — Telehealth: Admitting: Nurse Practitioner

## 2023-10-30 DIAGNOSIS — L237 Allergic contact dermatitis due to plants, except food: Secondary | ICD-10-CM | POA: Diagnosis not present

## 2023-10-30 MED ORDER — PREDNISONE 10 MG PO TABS: ORAL_TABLET | ORAL | 0 refills | Status: DC

## 2023-10-30 NOTE — Progress Notes (Signed)
E-Visit for Poison Ivy  We are sorry that you are not feeing well.  Here is how we plan to help!  Based on what you have shared with me it looks like you have had an allergic reaction to the oily resin from a group of plants.  This resin is very sticky, so it easily attaches to your skin, clothing, tools equipment, and pet's fur.    This blistering rash is often called poison ivy rash although it can come from contact with the leaves, stems and roots of poison ivy, poison oak and poison sumac.  The oily resin contains urushiol (u-ROO-she-ol) that produces a skin rash on exposed skin.  The severity of the rash depends on the amount of urushiol that gets on your skin.  A section of skin with more urushiol on it may develop a rash sooner.  The rash usually develops 12-48 hours after exposure and can last two to three weeks.  Your skin must come in direct contact with the plant's oil to be affected.  Blister fluid doesn't spread the rash.  However, if you come into contact with a piece of clothing or pet fur that has urushiol on it, the rash may spread out.  You can also transfer the oil to other parts of your body with your fingers.  Often the rash looks like a straight line because of the way the plant brushes against your skin.  Since your rash is widespread or has resulted in a large number of blisters, I have prescribed an oral corticosteroid.  Please follow these recommendations:  I have sent a prednisone dose pack to your chosen pharmacy. Be sure to follow the instructions carefully and complete the entire prescription. You may use Benadryl or Caladryl topical lotions to sooth the itch and remember cool, not hot, showers and baths can help relieve the itching!  Place cool, wet compresses on the affected area for 15-30 minutes several times a day.  You may also take oral antihistamines, such as diphenhydramine (Benadryl, others), which may also help you sleep better.  Watch your skin for any purulent  (pus) drainage or red streaking from the site.  If this occurs, contact your provider.  You may require an antibiotic for a skin infection.  Make sure that the clothes you were wearing as well as any towels or sheets that may have come in contact with the oil (urushiol) are washed in detergent and hot water.       I have developed the following plan to treat your condition I am prescribing a two week course of steroids (37 tablets of 10 mg prednisone).  Days 1-4 take 4 tablets (40 mg) daily  Days 5-8 take 3 tablets (30 mg) daily, Days 9-11 take 2 tablets (20 mg) daily, Days 12-14 take 1 tablet (10 mg) daily.    What can you do to prevent this rash?  Avoid the plants.  Learn how to identify poison ivy, poison oak and poison sumac in all seasons.  When hiking or engaging in other activities that might expose you to these plants, try to stay on cleared pathways.  If camping, make sure you pitch your tent in an area free of these plants.  Keep pets from running through wooded areas so that urushiol doesn't accidentally stick to their fur, which you may touch.  Remove or kill the plants.  In your yard, you can get rid of poison ivy by applying an herbicide or pulling it out of   the ground, including the roots, while wearing heavy gloves.  Afterward remove the gloves and thoroughly wash them and your hands.  Don't burn poison ivy or related plants because the urushiol can be carried by smoke.  Wear protective clothing.  If needed, protect your skin by wearing socks, boots, pants, long sleeves and vinyl gloves.  Wash your skin right away.  Washing off the oil with soap and water within 30 minutes of exposure may reduce your chances of getting a poison ivy rash.  Even washing after an hour or so can help reduce the severity of the rash.  If you walk through some poison ivy and then later touch your shoes, you may get some urushiol on your hands, which may then transfer to your face or body by touching or  rubbing.  If the contaminated object isn't cleaned, the urushiol on it can still cause a skin reaction years later.    Be careful not to reuse towels after you have washed your skin.  Also carefully wash clothing in detergent and hot water to remove all traces of the oil.  Handle contaminated clothing carefully so you don't transfer the urushiol to yourself, furniture, rugs or appliances.  Remember that pets can carry the oil on their fur and paws.  If you think your pet may be contaminated with urushiol, put on some long rubber gloves and give your pet a bath.  Finally, be careful not to burn these plants as the smoke can contain traces of the oil.  Inhaling the smoke may result in difficulty breathing. If that occurred you should see a physician as soon as possible.  See your doctor right away if:  The reaction is severe or widespread You inhaled the smoke from burning poison ivy and are having difficulty breathing Your skin continues to swell The rash affects your eyes, mouth or genitals Blisters are oozing pus You develop a fever greater than 100 F (37.8 C) The rash doesn't get better within a few weeks.  If you scratch the poison ivy rash, bacteria under your fingernails may cause the skin to become infected.  See your doctor if pus starts oozing from the blisters.  Treatment generally includes antibiotics.  Poison ivy treatments are usually limited to self-care methods.  And the rash typically goes away on its own in two to three weeks.     If the rash is widespread or results in a large number of blisters, your doctor may prescribe an oral corticosteroid, such as prednisone.  If a bacterial infection has developed at the rash site, your doctor may give you a prescription for an oral antibiotic.  MAKE SURE YOU  Understand these instructions. Will watch your condition. Will get help right away if you are not doing well or get worse.   Thank you for choosing an e-visit.  Your  e-visit answers were reviewed by a board certified advanced clinical practitioner to complete your personal care plan. Depending upon the condition, your plan could have included both over the counter or prescription medications.  Please review your pharmacy choice. Make sure the pharmacy is open so you can pick up prescription now. If there is a problem, you may contact your provider through MyChart messaging and have the prescription routed to another pharmacy.  Your safety is important to us. If you have drug allergies check your prescription carefully.   For the next 24 hours you can use MyChart to ask questions about today's visit, request a non-urgent   call back, or ask for a work or school excuse. You will get an email in the next two days asking about your experience. I hope that your e-visit has been valuable and will speed your recovery.    

## 2023-10-30 NOTE — Progress Notes (Signed)
 I have spent 5 minutes in review of e-visit questionnaire, review and updating patient chart, medical decision making and response to patient.   Claiborne Rigg, NP

## 2023-11-03 ENCOUNTER — Ambulatory Visit

## 2023-11-10 MED FILL — Budesonide-Formoterol Fumarate Dihyd Aerosol 160-4.5 MCG/ACT: RESPIRATORY_TRACT | 30 days supply | Qty: 10.2 | Fill #2 | Status: AC

## 2023-11-11 ENCOUNTER — Other Ambulatory Visit (HOSPITAL_COMMUNITY): Payer: Self-pay

## 2023-11-18 ENCOUNTER — Ambulatory Visit: Payer: 59 | Admitting: Dermatology

## 2023-12-01 ENCOUNTER — Ambulatory Visit: Attending: Surgery | Admitting: Surgery

## 2023-12-01 ENCOUNTER — Encounter: Payer: Self-pay | Admitting: Surgery

## 2023-12-01 VITALS — BP 148/91 | HR 69 | Resp 18 | Ht 73.0 in | Wt 200.0 lb

## 2023-12-01 DIAGNOSIS — Z09 Encounter for follow-up examination after completed treatment for conditions other than malignant neoplasm: Secondary | ICD-10-CM

## 2023-12-01 DIAGNOSIS — I7121 Aneurysm of the ascending aorta, without rupture: Secondary | ICD-10-CM | POA: Diagnosis not present

## 2023-12-01 NOTE — Progress Notes (Signed)
 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  He has been doing well and staying active.  Current Outpatient Medications  Medication Sig Dispense Refill   albuterol  (VENTOLIN  HFA) 108 (90 Base) MCG/ACT inhaler Inhale 2 puffs into the lungs every 4 (four) hours as needed for wheezing or shortness of breath.     aspirin  EC 81 MG tablet Take 81 mg by mouth daily. Swallow whole.     budesonide -formoterol  (SYMBICORT ) 160-4.5 MCG/ACT inhaler Inhale 2 puffs into the lungs in the morning and at bedtime. 10.2 g 5   cetirizine (ZYRTEC) 10 MG tablet Take 10 mg by mouth daily.     escitalopram  (LEXAPRO ) 10 MG tablet Take 1 tablet (10 mg total) by mouth daily at 8am 90 tablet 1   fluticasone  (FLONASE ) 50 MCG/ACT nasal spray Place 2 sprays into both nostrils daily. (Patient taking differently: Place 1 spray into both nostrils daily.) 16 g 6   losartan  (COZAAR ) 25 MG tablet Take 1 tablet (25 mg total) by mouth daily. 30 tablet 2   metoprolol  tartrate (LOPRESSOR ) 25 MG tablet Take 1 tablet (25 mg total) by mouth daily. 90 tablet 3   montelukast  (SINGULAIR ) 10 MG tablet Take 1 tablet (10 mg total) by mouth daily. 90 tablet 3   Multiple Vitamin (MULTIVITAMIN WITH MINERALS) TABS tablet Take 1 tablet by mouth daily.     naltrexone  (DEPADE) 50 MG tablet Take 1 tablet (50 mg total) by mouth daily at 8am 90 tablet 1   omeprazole  (PRILOSEC) 40 MG capsule Take 1 capsule (40 mg total) by mouth daily. 90 capsule 3   ondansetron  (ZOFRAN ) 4 MG tablet Take 1 tablet (4 mg total) by mouth every 8 (eight) hours as needed for nausea or vomiting. 20 tablet 0   tadalafil  (CIALIS ) 5 MG tablet Take 5 mg by mouth daily.     tadalafil  (CIALIS ) 5 MG tablet Take 1 tablet (5 mg total) by mouth daily. 90 tablet 3   traZODone  (DESYREL ) 50 MG tablet Take 1 tablet (50 mg total) by mouth daily at 9:00 PM ** 24 hour  max: 1 tablet ** 90 tablet 3   No current facility-administered medications for this visit.     Physical Exam: BP (!) 148/91   Pulse 69   Resp 18   Ht 6\' 1"  (1.854 m)   Wt 200 lb (90.7 kg)   SpO2 95%   BMI 26.39 kg/m  He looks well. Cardiac exam shows a regular rate and rhythm with normal heart sounds.  There is no murmur.  Diagnostic Tests:  Narrative & Impression  CLINICAL DATA:  Ascending thoracic aortic aneurysms repair follow-up   EXAM: CT ANGIOGRAPHY CHEST WITH CONTRAST   TECHNIQUE: Multidetector CT imaging of the chest was performed using the standard protocol during bolus administration of intravenous contrast. Multiplanar CT image reconstructions and MIPs were obtained to evaluate the vascular anatomy.   RADIATION DOSE REDUCTION: This exam was performed according to the departmental dose-optimization program which includes automated exposure control, adjustment of the mA and/or kV according to patient size and/or use of iterative reconstruction technique.   CONTRAST:  75mL OMNIPAQUE  IOHEXOL  350 MG/ML SOLN   COMPARISON:  April 28, 2022   FINDINGS: Cardiovascular: Status post repair of an ascending aortic aneurysms, the ascending aorta has been wrap. The aortic arch ascending aorta junction appears slightly elongated with surgical clips. The maximum diameter of  the ascending aorta proximal 3.4 by 3.8 cm.   No evidence of extravasation of contrast. No evidence of pseudoaneurysm formation.   Normal branching of the arch with normal visualization of the right brachiocephalic trunk, left common carotid artery and subclavian artery, descending thoracic aorta is unremarkable.   Coronary arteries are patent.   There is mild moderate coronary artery calcifications involving particularly the LAD distribution   No pericardial effusions   Completely healed median sternotomy.   Mediastinum/Nodes: No enlarged mediastinal, hilar, or axillary lymph nodes.  Thyroid  gland, trachea, and esophagus demonstrate no significant findings.   Lungs/Pleura: Lungs are clear. No pleural effusion or pneumothorax. No pulmonary nodules   Upper Abdomen: No acute abnormality.   Musculoskeletal: No chest wall abnormality. No acute or significant osseous findings.   Review of the MIP images confirms the above findings.   IMPRESSION: *Status post repair of ascending aortic aneurysm. No evidence of extravasation of contrast. No evidence of pseudoaneurysm formation. *Mild to moderate coronary artery calcifications involving particularly the LAD distribution. *No acute cardiopulmonary process.     Electronically Signed   By: Fredrich Jefferson M.D.   On: 10/01/2023 07:32      Impression:  He is doing well 1 year following his surgery.  Follow-up CTA of the chest shows a stable ascending aortic repair with no evidence of pseudoaneurysm.  The aortic root is normal size.  The aortic arch and descending thoracic aorta down to the level of the infrarenal abdominal aorta appear normal in size.  I discussed the importance of maintaining good blood pressure control and minimizing his other cardiac risk factors in preventing future aneurysm or aortic dissection.  Plan:  He will return to see me in 5 years with a CTA of the chest for aortic surveillance.  I spent 5 minutes performing this established patient evaluation and > 50% of this time was spent face to face counseling and coordinating the surveillance of his previously resected ascending aortic aneurysm.  Bartley Lightning, MD [facility] [phone]

## 2023-12-06 MED FILL — Budesonide-Formoterol Fumarate Dihyd Aerosol 160-4.5 MCG/ACT: RESPIRATORY_TRACT | 30 days supply | Qty: 10.2 | Fill #3 | Status: CN

## 2023-12-07 ENCOUNTER — Other Ambulatory Visit: Payer: Self-pay

## 2023-12-08 ENCOUNTER — Other Ambulatory Visit: Payer: Self-pay

## 2023-12-08 MED FILL — Budesonide-Formoterol Fumarate Dihyd Aerosol 160-4.5 MCG/ACT: RESPIRATORY_TRACT | 30 days supply | Qty: 10.2 | Fill #3 | Status: CN

## 2023-12-09 ENCOUNTER — Other Ambulatory Visit: Payer: Self-pay

## 2023-12-09 ENCOUNTER — Other Ambulatory Visit (HOSPITAL_COMMUNITY): Payer: Self-pay

## 2023-12-09 MED FILL — Budesonide-Formoterol Fumarate Dihyd Aerosol 160-4.5 MCG/ACT: RESPIRATORY_TRACT | 30 days supply | Qty: 10.2 | Fill #0 | Status: AC

## 2023-12-09 MED FILL — Budesonide-Formoterol Fumarate Dihyd Aerosol 160-4.5 MCG/ACT: RESPIRATORY_TRACT | 30 days supply | Qty: 10.2 | Fill #3 | Status: CN

## 2024-01-03 ENCOUNTER — Other Ambulatory Visit: Payer: Self-pay

## 2024-01-03 ENCOUNTER — Other Ambulatory Visit: Payer: Self-pay | Admitting: Family Medicine

## 2024-01-03 DIAGNOSIS — K5792 Diverticulitis of intestine, part unspecified, without perforation or abscess without bleeding: Secondary | ICD-10-CM

## 2024-01-03 MED FILL — Trazodone HCl Tab 50 MG: ORAL | 90 days supply | Qty: 90 | Fill #3 | Status: CN

## 2024-01-03 MED FILL — Montelukast Sodium Tab 10 MG (Base Equiv): ORAL | 90 days supply | Qty: 90 | Fill #1 | Status: CN

## 2024-01-03 MED FILL — Budesonide-Formoterol Fumarate Dihyd Aerosol 160-4.5 MCG/ACT: RESPIRATORY_TRACT | 30 days supply | Qty: 10.2 | Fill #1 | Status: AC

## 2024-01-03 MED FILL — Budesonide-Formoterol Fumarate Dihyd Aerosol 160-4.5 MCG/ACT: RESPIRATORY_TRACT | 30 days supply | Qty: 10.2 | Fill #2 | Status: CN

## 2024-01-04 ENCOUNTER — Other Ambulatory Visit: Payer: Self-pay

## 2024-01-04 NOTE — Telephone Encounter (Signed)
 Last office visit 04/05/2023 for CPE.  Last refilled ondansetron  04/09/2023 for #20 with no refills.  Naltrexone  04/06/2023 for #90 with 1 refill.  Patient note on refill:    Patient comment: Have been using 25 mg per MD because of side effects. Can pills be 25mg  so I don't have to cut?   I do not see that it comes in a 25 mg tablets.    Next Appt: CPE 04/05/2024.

## 2024-01-06 ENCOUNTER — Other Ambulatory Visit: Payer: Self-pay

## 2024-01-06 ENCOUNTER — Other Ambulatory Visit (HOSPITAL_COMMUNITY): Payer: Self-pay

## 2024-01-06 MED FILL — Trazodone HCl Tab 50 MG: ORAL | 90 days supply | Qty: 90 | Fill #0 | Status: AC

## 2024-01-06 MED FILL — Montelukast Sodium Tab 10 MG (Base Equiv): ORAL | 90 days supply | Qty: 90 | Fill #0 | Status: AC

## 2024-01-20 ENCOUNTER — Encounter (INDEPENDENT_AMBULATORY_CARE_PROVIDER_SITE_OTHER): Payer: Self-pay

## 2024-01-20 ENCOUNTER — Other Ambulatory Visit: Payer: Self-pay | Admitting: Family Medicine

## 2024-01-21 ENCOUNTER — Other Ambulatory Visit (HOSPITAL_COMMUNITY): Payer: Self-pay

## 2024-01-21 MED ORDER — NALTREXONE HCL 50 MG PO TABS
50.0000 mg | ORAL_TABLET | Freq: Every day | ORAL | 1 refills | Status: AC
  Filled 2024-01-21 – 2024-02-11 (×2): qty 90, 90d supply, fill #0

## 2024-01-21 NOTE — Telephone Encounter (Signed)
 Last office visit 04/05/2023 for CPE.  Last refilled 04/06/23 for #90 with 1 refills.  Next Appt: CPE 04/05/2024.

## 2024-01-22 ENCOUNTER — Other Ambulatory Visit (HOSPITAL_COMMUNITY): Payer: Self-pay

## 2024-01-22 ENCOUNTER — Other Ambulatory Visit: Payer: Self-pay | Admitting: Family Medicine

## 2024-01-25 ENCOUNTER — Ambulatory Visit: Admitting: Dermatology

## 2024-01-25 ENCOUNTER — Encounter: Payer: Self-pay | Admitting: Dermatology

## 2024-01-25 DIAGNOSIS — L814 Other melanin hyperpigmentation: Secondary | ICD-10-CM

## 2024-01-25 DIAGNOSIS — L570 Actinic keratosis: Secondary | ICD-10-CM | POA: Diagnosis not present

## 2024-01-25 DIAGNOSIS — W908XXA Exposure to other nonionizing radiation, initial encounter: Secondary | ICD-10-CM | POA: Diagnosis not present

## 2024-01-25 DIAGNOSIS — Z86018 Personal history of other benign neoplasm: Secondary | ICD-10-CM

## 2024-01-25 DIAGNOSIS — L988 Other specified disorders of the skin and subcutaneous tissue: Secondary | ICD-10-CM

## 2024-01-25 DIAGNOSIS — L578 Other skin changes due to chronic exposure to nonionizing radiation: Secondary | ICD-10-CM

## 2024-01-25 DIAGNOSIS — Z1283 Encounter for screening for malignant neoplasm of skin: Secondary | ICD-10-CM

## 2024-01-25 DIAGNOSIS — L820 Inflamed seborrheic keratosis: Secondary | ICD-10-CM

## 2024-01-25 DIAGNOSIS — L821 Other seborrheic keratosis: Secondary | ICD-10-CM

## 2024-01-25 DIAGNOSIS — D1801 Hemangioma of skin and subcutaneous tissue: Secondary | ICD-10-CM

## 2024-01-25 DIAGNOSIS — D692 Other nonthrombocytopenic purpura: Secondary | ICD-10-CM

## 2024-01-25 DIAGNOSIS — D229 Melanocytic nevi, unspecified: Secondary | ICD-10-CM

## 2024-01-25 DIAGNOSIS — Z85828 Personal history of other malignant neoplasm of skin: Secondary | ICD-10-CM

## 2024-01-25 NOTE — Patient Instructions (Addendum)
 OCULOPLASTIC SURGEONS (UPPER/LOWER BLEPHAROPLASTY):  Dr. Deward Brownie in [place]  phone #[phone] Dr. Mliss Sheldon in [facility] [place]  at [facility] phone number [phone] Dr. Greig Gay [phone]    Actinic keratoses are precancerous spots that appear secondary to cumulative UV radiation exposure/sun exposure over time. They are chronic with expected duration over 1 year. A portion of actinic keratoses will progress to squamous cell carcinoma of the skin. It is not possible to reliably predict which spots will progress to skin cancer and so treatment is recommended to prevent development of skin cancer.  Recommend daily broad spectrum sunscreen SPF 30+ to sun-exposed areas, reapply every 2 hours as needed.  Recommend staying in the shade or wearing long sleeves, sun glasses (UVA+UVB protection) and wide brim hats (4-inch brim around the entire circumference of the hat). Call for new or changing lesions.    Cryotherapy Aftercare  Wash gently with soap and water  everyday.   Apply Vaseline and Band-Aid daily until healed.   Seborrheic Keratosis  What causes seborrheic keratoses? Seborrheic keratoses are harmless, common skin growths that first appear during adult life.  As time goes by, more growths appear.  Some people may develop a large number of them.  Seborrheic keratoses appear on both covered and uncovered body parts.  They are not caused by sunlight.  The tendency to develop seborrheic keratoses can be inherited.  They vary in color from skin-colored to gray, brown, or even black.  They can be either smooth or have a rough, warty surface.   Seborrheic keratoses are superficial and look as if they were stuck on the skin.  Under the microscope this type of keratosis looks like layers upon layers of skin.  That is why at times the top layer may seem to fall off, but the rest of the growth remains and re-grows.    Treatment Seborrheic keratoses do not  need to be treated, but can easily be removed in the office.  Seborrheic keratoses often cause symptoms when they rub on clothing or jewelry.  Lesions can be in the way of shaving.  If they become inflamed, they can cause itching, soreness, or burning.  Removal of a seborrheic keratosis can be accomplished by freezing, burning, or surgery. If any spot bleeds, scabs, or grows rapidly, please return to have it checked, as these can be an indication of a skin cancer.    Melanoma ABCDEs  Melanoma is the most dangerous type of skin cancer, and is the leading cause of death from skin disease.  You are more likely to develop melanoma if you: Have light-colored skin, light-colored eyes, or red or blond hair Spend a lot of time in the sun Tan regularly, either outdoors or in a tanning bed Have had blistering sunburns, especially during childhood Have a close family member who has had a melanoma Have atypical moles or large birthmarks  Early detection of melanoma is key since treatment is typically straightforward and cure rates are extremely high if we catch it early.   The first sign of melanoma is often a change in a mole or a new dark spot.  The ABCDE system is a way of remembering the signs of melanoma.  A for asymmetry:  The two halves do not match. B for border:  The edges of the growth are irregular. C for color:  A mixture of colors are present instead of an even brown color. D for diameter:  Melanomas are usually (but not always)  greater than 6mm - the size of a pencil eraser. E for evolution:  The spot keeps changing in size, shape, and color.  Please check your skin once per month between visits. You can use a small mirror in front and a large mirror behind you to keep an eye on the back side or your body.   If you see any new or changing lesions before your next follow-up, please call to schedule a visit.  Please continue daily skin protection including broad spectrum sunscreen SPF  30+ to sun-exposed areas, reapplying every 2 hours as needed when you're outdoors.   Staying in the shade or wearing long sleeves, sun glasses (UVA+UVB protection) and wide brim hats (4-inch brim around the entire circumference of the hat) are also recommended for sun protection.       Due to recent changes in healthcare laws, you may see results of your pathology and/or laboratory studies on MyChart before the doctors have had a chance to review them. We understand that in some cases there may be results that are confusing or concerning to you. Please understand that not all results are received at the same time and often the doctors may need to interpret multiple results in order to provide you with the best plan of care or course of treatment. Therefore, we ask that you please give us  2 business days to thoroughly review all your results before contacting the office for clarification. Should we see a critical lab result, you will be contacted sooner.   If You Need Anything After Your Visit  If you have any questions or concerns for your doctor, please call our main line at [phone] and press option 4 to reach your doctor's medical assistant. If no one answers, please leave a voicemail as directed and we will return your call as soon as possible. Messages left after 4 pm will be answered the following business day.   You may also send us  a message via MyChart. We typically respond to MyChart messages within 1-2 business days.  For prescription refills, please ask your pharmacy to contact our office. Our fax number is [phone].  If you have an urgent issue when the clinic is closed that cannot wait until the next business day, you can page your doctor at the number below.    Please note that while we do our best to be available for urgent issues outside of office hours, we are not available 24/7.   If you have an urgent issue and are unable to reach us , you may choose to seek medical  care at your doctor's office, retail clinic, urgent care center, or emergency room.  If you have a medical emergency, please immediately call 911 or go to the emergency department.  Pager Numbers  - Dr. Hester: [phone]  - Dr. Jackquline: [phone]  - Dr. Claudene: [phone]   In the event of inclement weather, please call our main line at [phone] for an update on the status of any delays or closures.  Dermatology Medication Tips: Please keep the boxes that topical medications come in in order to help keep track of the instructions about where and how to use these. Pharmacies typically print the medication instructions only on the boxes and not directly on the medication tubes.   If your medication is too expensive, please contact our office at [phone] option 4 or send us  a message through MyChart.   We are unable to tell what your co-pay for medications will be  in advance as this is different depending on your insurance coverage. However, we may be able to find a substitute medication at lower cost or fill out paperwork to get insurance to cover a needed medication.   If a prior authorization is required to get your medication covered by your insurance company, please allow us  1-2 business days to complete this process.  Drug prices often vary depending on where the prescription is filled and some pharmacies may offer cheaper prices.  The website www.goodrx.com contains coupons for medications through different pharmacies. The prices here do not account for what the cost may be with help from insurance (it may be cheaper with your insurance), but the website can give you the price if you did not use any insurance.  - You can print the associated coupon and take it with your prescription to the pharmacy.  - You may also stop by our office during regular business hours and pick up a GoodRx coupon card.  - If you need your prescription sent electronically to a different  pharmacy, notify our office through [facility] or by phone at [phone] option 4.     Si Usted Necesita Algo Despus de Su Visita  Tambin puede enviarnos un mensaje a travs de Clinical cytogeneticist. Por lo general respondemos a los mensajes de MyChart en el transcurso de 1 a 2 das hbiles.  Para renovar recetas, por favor pida a su farmacia que se ponga en contacto con nuestra oficina. Randi lakes de fax es [place] [phone].  Si tiene un asunto urgente cuando la clnica est cerrada y que no puede esperar hasta el siguiente da hbil, puede llamar/localizar a su doctor(a) al nmero que aparece a continuacin.   Por favor, tenga en cuenta que aunque hacemos todo lo posible para estar disponibles para asuntos urgentes fuera del horario de [place], no estamos disponibles las 24 horas del da, los 7 [address]  [address] de la [place].   Si tiene un problema urgente y no puede comunicarse con nosotros, puede optar por buscar atencin mdica  en el consultorio de su doctor(a), en una clnica privada, en un centro de atencin urgente o en una sala de emergencias.  Si tiene Engineer, drilling, por favor llame inmediatamente al 911 o vaya a la sala de emergencias.  Nmeros de bper  - Dr. Hester: [phone]  - Dra. Jackquline: [phone]  - Dr. Claudene: [phone]   En caso de inclemencias del tiempo, por favor llame a landry capes principal al [phone] para una actualizacin sobre el [place] de cualquier retraso o cierre.  Consejos para la medicacin en dermatologa: Por favor, guarde las cajas en las que vienen los medicamentos de uso tpico para ayudarle a seguir las instrucciones sobre dnde y cmo usarlos. Las farmacias generalmente imprimen las instrucciones del medicamento slo en las cajas y no directamente en los tubos del [place].   Si su medicamento es muy caro, por favor, pngase en contacto con landry rieger llamando al [phone] y presione la opcin 4 o envenos un mensaje a  travs de Clinical cytogeneticist.   No podemos decirle cul ser su copago por los medicamentos por adelantado ya que esto es diferente dependiendo de la cobertura de su seguro. Sin embargo, es posible que podamos encontrar un medicamento sustituto a Audiological scientist un formulario para que el seguro cubra el medicamento que se considera necesario.   Si se requiere una autorizacin previa para que su compaa de seguros malta su medicamento, por favor permtanos de 1  a 2 das hbiles para completar este proceso.  Los precios de los medicamentos varan con frecuencia dependiendo del Environmental consultant de dnde se surte la receta y alguna farmacias pueden ofrecer precios ms baratos.  El sitio web www.goodrx.com tiene cupones para medicamentos de Health and safety inspector. Los precios aqu no tienen en cuenta lo que podra costar con la ayuda del seguro (puede ser ms barato con su seguro), pero el sitio web puede darle el precio si no utiliz Tourist information centre manager.  - Puede imprimir el cupn correspondiente y llevarlo con su receta a la farmacia.  - Tambin puede pasar por nuestra oficina durante el horario de atencin regular y Education officer, museum una tarjeta de cupones de GoodRx.  - Si necesita que su receta se enve electrnicamente a una farmacia diferente, informe a nuestra oficina a travs de MyChart de [place] o por telfono llamando al [phone] y presione la opcin 4.

## 2024-01-25 NOTE — Progress Notes (Signed)
 Follow-Up Visit   Subjective  Margo BRAVO Branch is a 63 y.o. male who presents for the following: Skin Cancer Screening and Full Body Skin Exam Hx of aks, isk, hx of bcc, hx of dysplastic nevi,  Has used 5 f/u cream on rough spots in past   The patient presents for Total-Body Skin Exam (TBSE) for skin cancer screening and mole check. The patient has spots, moles and lesions to be evaluated, some may be new or changing and the patient may have concern these could be cancer.  The following portions of the chart were reviewed this encounter and updated as appropriate: medications, allergies, medical history  Review of Systems:  No other skin or systemic complaints except as noted in HPI or Assessment and Plan.  Objective  Well appearing patient in no apparent distress; mood and affect are within normal limits.  A full examination was performed including scalp, head, eyes, ears, nose, lips, neck, chest, axillae, abdomen, back, buttocks, bilateral upper extremities, bilateral lower extremities, hands, feet, fingers, toes, fingernails, and toenails. All findings within normal limits unless otherwise noted below.   Relevant physical exam findings are noted in the Assessment and Plan.  Scalp x 5 (5) Erythematous stuck-on, waxy papule or plaque left forehead x 1, left preauricular x 1 (2) Erythematous thin papules/macules with gritty scale.   Assessment & Plan   SKIN CANCER SCREENING PERFORMED TODAY.  ACTINIC DAMAGE - Chronic condition, secondary to cumulative UV/sun exposure - diffuse scaly erythematous macules with underlying dyspigmentation - Recommend daily broad spectrum sunscreen SPF 30+ to sun-exposed areas, reapply every 2 hours as needed.  - Staying in the shade or wearing long sleeves, sun glasses (UVA+UVB protection) and wide brim hats (4-inch brim around the entire circumference of the hat) are also recommended for sun protection.  - Call for new or changing  lesions.  LENTIGINES, SEBORRHEIC KERATOSES, HEMANGIOMAS - Benign normal skin lesions - Benign-appearing - Call for any changes  MELANOCYTIC NEVI - Tan-brown and/or pink-flesh-colored symmetric macules and papules - Benign appearing on exam today - Observation - Call clinic for new or changing moles - Recommend daily use of broad spectrum spf 30+ sunscreen to sun-exposed areas.   Purpura - Chronic; persistent and recurrent.  Treatable, but not curable. - Violaceous macules and patches - Benign - Related to trauma, age, sun damage and/or use of blood thinners, chronic use of topical and/or oral steroids - Observe - Can use OTC arnica containing moisturizer such as Dermend Bruise Formula if desired - Call for worsening or other concerns  HISTORY OF BASAL CELL CARCINOMA OF THE SKIN 03/26/2020 right lateral deltoid treated with ED&C - No evidence of recurrence today - Recommend regular full body skin exams - Recommend daily broad spectrum sunscreen SPF 30+ to sun-exposed areas, reapply every 2 hours as needed.  - Call if any new or changing lesions are noted between office visits  HISTORY OF DYSPLASTIC NEVUS 03/07/2020 left costal infrapectoral moderate  03/07/2020 right posterior flank above waistline - moderate  No evidence of recurrence today Recommend regular full body skin exams Recommend daily broad spectrum sunscreen SPF 30+ to sun-exposed areas, reapply every 2 hours as needed.  Call if any new or changing lesions are noted between office visits  FACIAL ELASTOSIS Exam: Rhytides and volume loss. Treatment Plan: Discussed bags under eye Recommend ocular surgeon for  blepharoplasty Dr. Greig Gay or Dr. Mliss Sheldon  Recommend daily broad spectrum sunscreen SPF 30+ to sun-exposed areas, reapply every 2 hours  as needed. Call for new or changing lesions.  Staying in the shade or wearing long sleeves, sun glasses (UVA+UVB protection) and wide brim hats (4-inch brim around the  entire circumference of the hat) are also recommended for sun protection.   INFLAMED SEBORRHEIC KERATOSIS (5) Scalp x 5 (5) Symptomatic, irritating, patient would like treated. Destruction of lesion - Scalp x 5 (5) Complexity: simple   Destruction method: cryotherapy   Informed consent: discussed and consent obtained   Timeout:  patient name, date of birth, surgical site, and procedure verified Lesion destroyed using liquid nitrogen: Yes   Region frozen until ice ball extended beyond lesion: Yes   Outcome: patient tolerated procedure well with no complications   Post-procedure details: wound care instructions given    ACTINIC KERATOSIS (2) left forehead x 1, left preauricular x 1 (2) Actinic keratoses are precancerous spots that appear secondary to cumulative UV radiation exposure/sun exposure over time. They are chronic with expected duration over 1 year. A portion of actinic keratoses will progress to squamous cell carcinoma of the skin. It is not possible to reliably predict which spots will progress to skin cancer and so treatment is recommended to prevent development of skin cancer.  Recommend daily broad spectrum sunscreen SPF 30+ to sun-exposed areas, reapply every 2 hours as needed.  Recommend staying in the shade or wearing long sleeves, sun glasses (UVA+UVB protection) and wide brim hats (4-inch brim around the entire circumference of the hat). Call for new or changing lesions. Destruction of lesion - left forehead x 1, left preauricular x 1 (2) Complexity: simple   Destruction method: cryotherapy   Informed consent: discussed and consent obtained   Timeout:  patient name, date of birth, surgical site, and procedure verified Lesion destroyed using liquid nitrogen: Yes   Region frozen until ice ball extended beyond lesion: Yes   Outcome: patient tolerated procedure well with no complications   Post-procedure details: wound care instructions given    Return in about 1 year  (around 01/24/2025) for TBSE.  IEleanor Blush, CMA, am acting as scribe for Alm Rhyme, MD.   Documentation: I have reviewed the above documentation for accuracy and completeness, and I agree with the above.  Alm Rhyme, MD

## 2024-01-26 IMAGING — DX DG CHEST 2V
2 series · 2 of 2 positions shown · non-contrast
Comparison: 07/16/2016

CLINICAL DATA: 60-year-old male with a history of cough and asthma

EXAM:
CHEST - 2 VIEW

[chest pa]
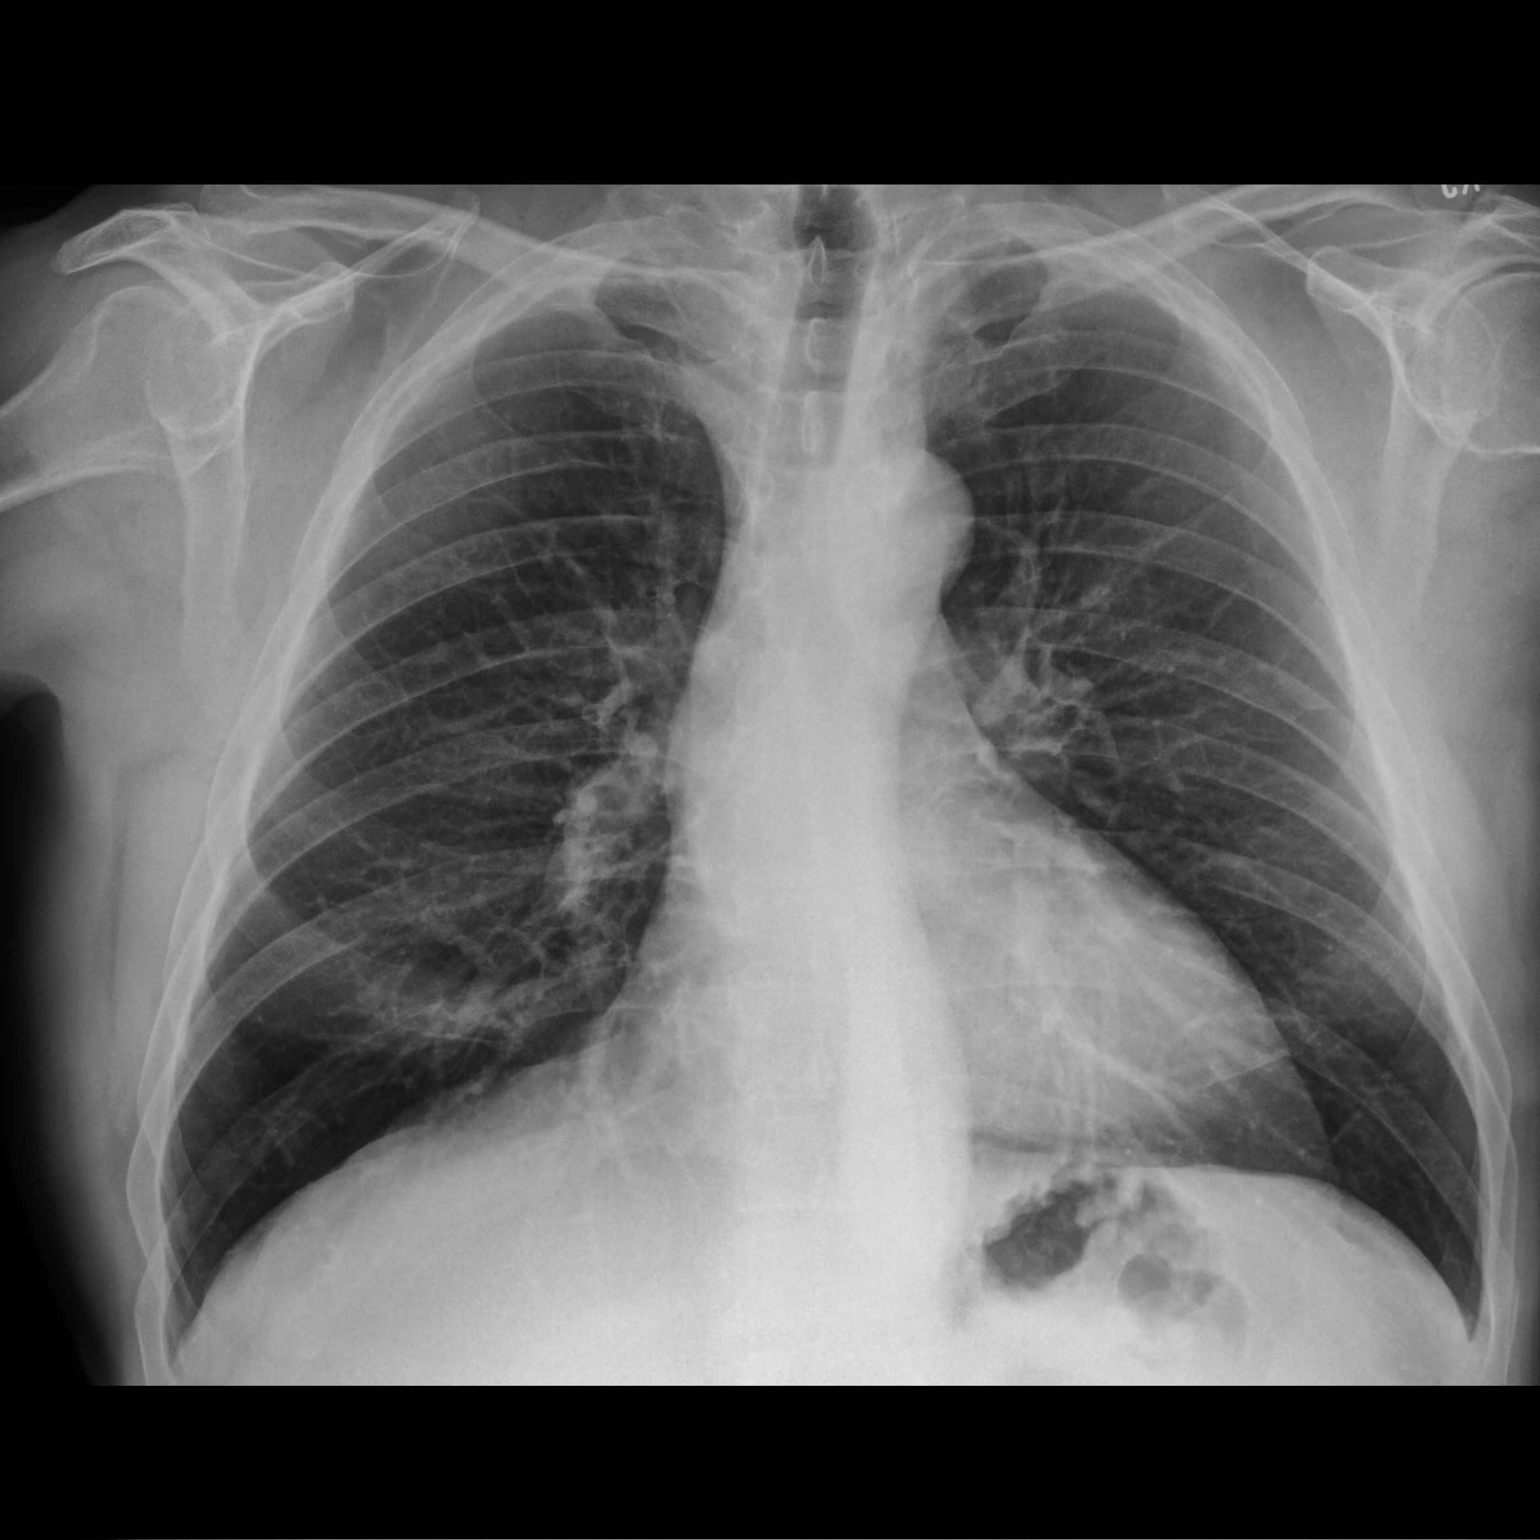

[chest lat]
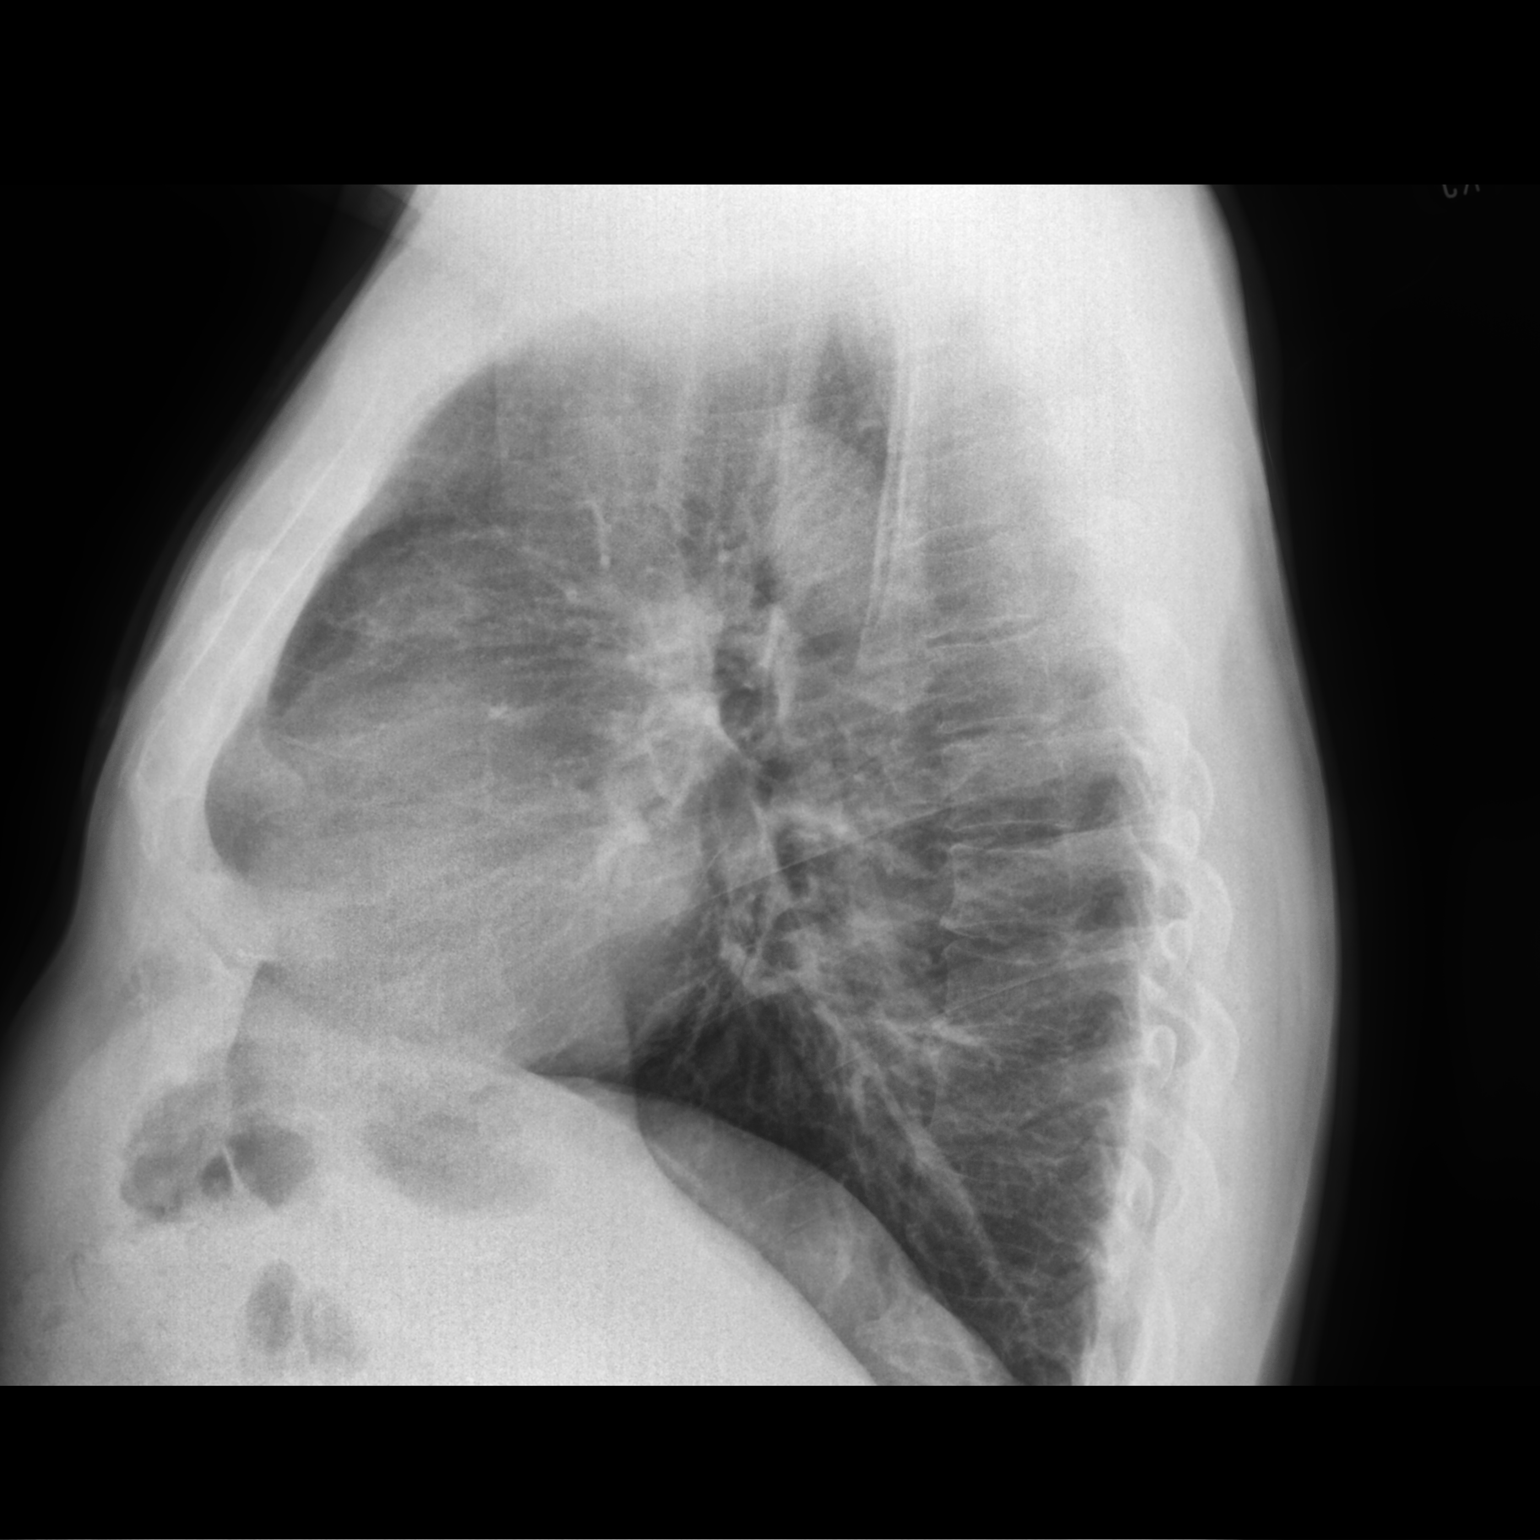

[2 of 2 positions shown; findings below may reference images not displayed]

FINDINGS: Cardiomediastinal silhouette unchanged in size and contour. No
evidence of central vascular congestion. No interlobular septal
thickening.

No pneumothorax or pleural effusion. Coarsened interstitial
markings, with no confluent airspace disease.

No acute displaced fracture. Degenerative changes of the spine.
IMPRESSION: Similar appearance of the chest, with chronic changes and no
evidence of acute cardiopulmonary disease

## 2024-01-28 ENCOUNTER — Other Ambulatory Visit (HOSPITAL_COMMUNITY): Payer: Self-pay

## 2024-02-02 MED FILL — Budesonide-Formoterol Fumarate Dihyd Aerosol 160-4.5 MCG/ACT: RESPIRATORY_TRACT | 30 days supply | Qty: 10.2 | Fill #2 | Status: AC

## 2024-02-04 ENCOUNTER — Other Ambulatory Visit: Payer: Self-pay

## 2024-02-04 ENCOUNTER — Other Ambulatory Visit (HOSPITAL_COMMUNITY): Payer: Self-pay

## 2024-02-04 ENCOUNTER — Encounter: Payer: Self-pay | Admitting: Pharmacist

## 2024-02-07 ENCOUNTER — Other Ambulatory Visit: Payer: Self-pay

## 2024-02-11 ENCOUNTER — Other Ambulatory Visit: Payer: Self-pay

## 2024-02-11 ENCOUNTER — Other Ambulatory Visit (HOSPITAL_COMMUNITY): Payer: Self-pay

## 2024-02-25 ENCOUNTER — Other Ambulatory Visit: Payer: Self-pay | Admitting: Medical Genetics

## 2024-03-01 ENCOUNTER — Other Ambulatory Visit: Payer: Self-pay | Admitting: Pulmonary Disease

## 2024-03-01 ENCOUNTER — Other Ambulatory Visit: Payer: Self-pay

## 2024-03-01 ENCOUNTER — Other Ambulatory Visit (HOSPITAL_COMMUNITY): Payer: Self-pay

## 2024-03-01 MED ORDER — BUDESONIDE-FORMOTEROL FUMARATE 160-4.5 MCG/ACT IN AERO
2.0000 | INHALATION_SPRAY | Freq: Two times a day (BID) | RESPIRATORY_TRACT | 5 refills | Status: AC
  Filled 2024-03-01: qty 10.2, 30d supply, fill #0
  Filled 2024-03-26: qty 10.2, 30d supply, fill #1
  Filled 2024-04-25: qty 10.2, 30d supply, fill #2
  Filled 2024-05-23: qty 10.2, 30d supply, fill #3
  Filled 2024-07-02: qty 10.2, 30d supply, fill #4

## 2024-03-31 ENCOUNTER — Ambulatory Visit: Admitting: Pulmonary Disease

## 2024-03-31 ENCOUNTER — Encounter: Payer: Self-pay | Admitting: Pulmonary Disease

## 2024-03-31 VITALS — BP 151/83 | HR 50 | Temp 97.9°F | Ht 73.0 in | Wt 205.0 lb

## 2024-03-31 DIAGNOSIS — J4540 Moderate persistent asthma, uncomplicated: Secondary | ICD-10-CM | POA: Diagnosis not present

## 2024-03-31 NOTE — Patient Instructions (Signed)
  VISIT SUMMARY: You had a follow-up appointment to manage your moderate persistent asthma. Your symptoms are well-controlled with your current medication regimen, and you are able to adjust your Symbicort  dosage as needed.  YOUR PLAN: MODERATE PERSISTENT ASTHMA: Your asthma is well-controlled with your current medications. You experience occasional wheezing and productive cough, especially when exposed to environmental triggers like smoke. -Continue using Symbicort  as both a maintenance and reliever inhaler, adjusting the dose as needed. -Stop using the albuterol  inhaler. -Continue taking Singulair  daily. -Follow up in one year.

## 2024-03-31 NOTE — Progress Notes (Signed)
 Austin Houston    [id number]    [birth year]  Primary Care Physician:Copland, Jacques, MD  Referring Physician: Watt Jacques, MD [address] [place],  [place] [address]  Chief complaint: Follow-up for asthma  HPI: 63 y.o. -old with history of asthma He was initially on Foradil  and then Qvar .  This was changed to Symbicort  in 2021 He developed COVID-19 in June 2022 followed by flu infection around Thanksgiving of 2022.  Reports worsening asthma control since these infections. He was previously using Symbicort  intermittently but now using 2 puffs twice daily.  He also needs to use his albuterol  rescue inhaler up to 3 times daily  Has seasonal allergies for which she is taking Zyrtec over-the-counter.  Denies GERD symptoms  Interim history: Discussed the use of AI scribe software for clinical note transcription with the patient, who gave verbal consent to proceed.  History of Present Illness Austin Houston is a 63 year old male with moderate persistent asthma who presents for follow-up of his asthma management.  Asthma symptoms and control - Moderate persistent asthma with current follow-up for management - Symbicort  usage reduced to approximately one puff daily - During spring, required up to four puffs daily due to increased coughing and wheezing - Cough is organized, productive, and manageable - Recent exposure to campfire smoke caused mild symptom exacerbation, managed by avoiding triggers - Rare nocturnal symptoms, with occasional wheezing  Asthma medication adherence and adjustments - Takes Symbicort  once daily in the morning, occasionally at night if needed - Uses daily Singulair  I printed for him he just to follow-up in a year Relevant pulmonary history: Pets: Dog 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  No ongoing exposures.  Denies any mold, hot tub, Jacuzzi.  No feather pillows or comforters Smoking history: Never smoker.  No vaping Travel history: No significant travel history Relevant family history: No family history of lung disease   Outpatient Encounter Medications as of 03/31/2024  Medication Sig   albuterol  (VENTOLIN  HFA) 108 (90 Base) MCG/ACT inhaler Inhale 2 puffs into the lungs every 4 (four) hours as needed for wheezing or shortness of breath.   aspirin  EC 81 MG tablet Take 81 mg by mouth daily. Swallow whole.   budesonide -formoterol  (SYMBICORT ) 160-4.5 MCG/ACT inhaler Inhale 2 puffs into the lungs in the morning and at bedtime.   cetirizine (ZYRTEC) 10 MG tablet Take 10 mg by mouth daily.   escitalopram  (LEXAPRO ) 10 MG tablet Take 1 tablet (10 mg total) by mouth daily at 8am   fluticasone  (FLONASE ) 50 MCG/ACT nasal spray Place 2 sprays into both nostrils daily.   losartan  (COZAAR ) 25 MG tablet Take 1 tablet (25 mg total) by mouth daily.   metoprolol  tartrate (LOPRESSOR ) 25 MG tablet Take 1 tablet (25 mg total) by mouth daily.   montelukast  (SINGULAIR ) 10 MG tablet Take 1 tablet (10 mg total) by mouth daily.   Multiple Vitamin (MULTIVITAMIN WITH MINERALS) TABS tablet Take 1 tablet by mouth daily.   naltrexone  (DEPADE) 50 MG tablet Take 1 tablet (50 mg total) by mouth daily at 8am   omeprazole  (PRILOSEC) 40 MG capsule Take 1 capsule (40 mg total) by mouth daily.   ondansetron  (ZOFRAN ) 4 MG tablet Take 1 tablet (4 mg total) by mouth every 8 (eight) hours as needed for nausea or  vomiting.   tadalafil  (CIALIS ) 5 MG tablet Take 5 mg by mouth daily.   tadalafil  (CIALIS ) 5 MG tablet Take 1 tablet (5 mg total) by mouth daily.   traZODone  (DESYREL ) 50 MG tablet Take 1 tablet (50 mg total) by mouth daily at 9:00 PM ** 24 hour max: 1 tablet **   [DISCONTINUED] atorvastatin  (LIPITOR) 20 MG tablet Take 1 tablet (20 mg total) by mouth daily.   [DISCONTINUED] beclomethasone (QVAR ) 80  MCG/ACT inhaler Inhale 2 puffs into the lungs daily.   No facility-administered encounter medications on file as of 03/31/2024.    Vitals:   03/31/24 1141  BP: (!) 151/83  Pulse: (!) 50  Temp: 97.9 F (36.6 C)  Height: 6' 1 (1.854 m)  Weight: 205 lb (93 kg)  SpO2: 95%  TempSrc: Oral  BMI (Calculated): 27.05     Physical Exam GEN: No acute distress. CV: Regular rate and rhythm, no murmurs. LUNGS: Clear to auscultation bilaterally, normal respiratory effort. SKIN JOINTS: Warm and dry, no rash.    Data Reviewed: Imaging: CT high-resolution 01/07/2017-no evidence of interstitial lung disease, thoracic aortic aneurysm, two-vessel coronary atherosclerosis, hepatic steatosis.    Chest x-ray 08/25/2021-coarsened interstitial changes.  CT chest 04/28/2022-ascending aortic aneurysm measuring 5 cm, hepatic steatosis.  Visualized lungs are clear  CTA 09/14/2023-lungs are clear with no acute abnormality I have reviewed the images personally.  PFTs: 04/06/2017 FVC 5.44 [100%], FEV1 4.45 [106%], F/F 82, TLC 8.57 [131%], DLCO 42.13 [115%]  11/11/2021 FVC 5.25 [98%], FEV1 4.17 [103%], F/F 79, TLC 7.93 [104%], DLCO 45.62 [149%]  ACT score  08/25/2021- 15 11/11/2021- 23  Labs: CBC 12/12/2020-WBC 3.1, eos 7.3%, absolute eosinophilic count 226 CBC 08/25/2021-WBC 4.4, eos 4.5%, absolute eosinophil count 198  IgE 08/25/2021-1689  Assessment & Plan Moderate persistent asthma Moderate persistent asthma is well-controlled with current medication regimen. Symptoms include occasional wheezing and productive cough, particularly in response to environmental triggers such as smoke. He is currently using Symbicort  effectively, titrating the dose as needed, and reports no significant limitations in daily activities. Symbicort  is being used as both a maintenance and reliever inhaler (SMART), which is supported by studies showing better asthma control compared to using albuterol  as a rescue inhaler.  Recent CT  angiogram reviewed with clear lungs - Continue Symbicort  as both maintenance and reliever inhaler, adjusting dose as needed. - Discontinue albuterol  inhaler. - Continue Singulair  daily. - Follow up in one year.  Plan/Recommendations: Continue Symbicort , Singulair  Follow-up in 1 year  Lonna Coder MD [facility] 03/31/2024, 11:47 AM  CC: Watt Mirza, MD

## 2024-04-01 ENCOUNTER — Other Ambulatory Visit: Payer: Self-pay

## 2024-04-03 ENCOUNTER — Other Ambulatory Visit: Payer: Self-pay

## 2024-04-05 ENCOUNTER — Encounter: Admitting: Family Medicine

## 2024-04-08 MED FILL — Montelukast Sodium Tab 10 MG (Base Equiv): ORAL | 90 days supply | Qty: 90 | Fill #1 | Status: AC

## 2024-04-10 ENCOUNTER — Other Ambulatory Visit
Admission: RE | Admit: 2024-04-10 | Discharge: 2024-04-10 | Disposition: A | Discharge status: Home / Self Care | Payer: Self-pay | Source: Ambulatory Visit | Attending: Medical Genetics | Admitting: Medical Genetics

## 2024-04-11 ENCOUNTER — Other Ambulatory Visit: Payer: Self-pay | Admitting: Family Medicine

## 2024-04-11 ENCOUNTER — Other Ambulatory Visit (HOSPITAL_COMMUNITY): Payer: Self-pay

## 2024-04-11 ENCOUNTER — Other Ambulatory Visit: Payer: Self-pay

## 2024-04-11 MED ORDER — TRAZODONE HCL 50 MG PO TABS
50.0000 mg | ORAL_TABLET | Freq: Every day | ORAL | 0 refills | Status: DC
  Filled 2024-04-11: qty 90, 90d supply, fill #0

## 2024-04-11 MED ORDER — ESCITALOPRAM OXALATE 10 MG PO TABS
10.0000 mg | ORAL_TABLET | Freq: Every day | ORAL | 0 refills | Status: DC
  Filled 2024-04-11: qty 90, 90d supply, fill #0

## 2024-04-21 LAB — GENECONNECT MOLECULAR SCREEN: Genetic Analysis Overall Interpretation: NEGATIVE

## 2024-04-26 ENCOUNTER — Other Ambulatory Visit (HOSPITAL_COMMUNITY): Payer: Self-pay

## 2024-04-26 ENCOUNTER — Other Ambulatory Visit: Payer: Self-pay

## 2024-05-01 NOTE — Progress Notes (Unsigned)
 Austin Newbold T. Gabreille Dardis, MD, CAQ Sports Medicine [facility] at [facility] [address] [place], [address]  Phone: [phone]  FAX: [phone]  Austin Houston - 63 y.o. male  MRN [id number]  Date of Birth: 10/08/1960  Date: 05/03/2024  PCP: Watt Mirza, MD  Referral: Watt Mirza, MD  No chief complaint on file.  Patient Care Team: Watt Mirza, MD as PCP - General (Family Medicine) Darron Deatrice LABOR, MD as PCP - Cardiology (Cardiology) Subjective:   Austin Houston is a 63 y.o. pleasant patient who presents with the following:  Discussed the use of AI scribe software for clinical note transcription with the patient, who gave verbal consent to proceed.  History of Present Illness     Preventative Health Maintenance Visit:  Health Maintenance Summary Reviewed and updated, unless pt declines services.  Tobacco History Reviewed. Alcohol : No concerns, no excessive use Exercise Habits: Some activity, rec at least 30 mins 5 times a week STD concerns: no risk or activity to increase risk Drug Use: None  Prevnar 20 Flu vaccine  He has basically done well.  He does have a history of prostate cancer as well as follicular thyroid  carcinoma.  Asthma, which has been stable.  Stable hypertension hyperlipidemia.  He has had some elevated liver enzymes secondary to chronic alcoholism.  Health Maintenance  Topic Date Due   Pneumococcal Vaccine: 50+ Years (1 of 2 - PCV) Never done   Hepatitis B Vaccines 19-59 Average Risk (2 of 3 - 19+ 3-dose series) 01/09/1983   Influenza Vaccine  02/11/2024   COVID-19 Vaccine (6 - 2025-26 season) 03/13/2024   Colonoscopy  01/22/2026   DTaP/Tdap/Td (5 - Td or Tdap) 08/28/2032   Hepatitis C Screening  Completed   HIV Screening  Completed   Zoster Vaccines- Shingrix  Completed   HPV VACCINES  Aged Out   Meningococcal B Vaccine  Aged Out   Immunization History  Administered Date(s)  Administered   Anthrax 09/14/2001, 09/28/2001, 06/16/2002, 03/24/2003   Hepatitis A, Adult 02/11/1996   Hepatitis B, ADULT 12/12/1982   Influenza Nasal 03/19/2007   Influenza Split 09/10/2016   Influenza Whole 05/13/1996, 07/22/1997, 07/20/1999, 06/13/2000, 06/30/2001, 04/30/2002   Influenza, Quadrivalent, Recombinant, Inj, Pf 04/13/2018   Influenza, Seasonal, Injecte, Preservative Fre 04/05/2023   Influenza,inj,Quad PF,6+ Mos 04/13/2015, 04/01/2020   Influenza-Unspecified 03/31/2017, 04/14/2019   MMR 08/19/1997   Meningococcal polysaccharide vaccine (MPSV4) 06/13/2000, 06/13/2005   OPV 09/10/1985   PFIZER(Purple Top)SARS-COV-2 Vaccination 07/28/2019, 08/17/2019, 04/01/2020   Pfizer Covid-19 Vaccine Bivalent Booster 32yrs & up 07/24/2021   Pfizer(Comirnaty )Fall Seasonal Vaccine 12 years and older 04/05/2023   Td 02/11/1996, 03/13/2004   Tdap 10/21/2016, 08/28/2022   Typhoid Inactivated 07/18/2005   Typhoid Parenteral 12/11/1998, 07/23/2001, 07/21/2003   Typhoid Parenteral, AKD ([place]  Military) 12/12/1995   Yellow Fever 06/13/1995, 06/13/2005   Zoster Recombinant(Shingrix) 12/12/2020, 07/24/2021   Patient Active Problem List   Diagnosis Date Noted   Prostate cancer (HCC) 01/08/2016    Priority: High   Follicular cancer of thyroid  (HCC)     Priority: High   Elevated liver enzymes 02/21/2023    Priority: Medium    Alcohol  use disorder, severe 02/16/2023    Priority: Medium    Mild persistent asthma without complication 01/24/2020    Priority: Medium    Hypertension     Priority: Medium    Hyperlipidemia     Priority: Medium    Anxiety and depression 02/21/2023    Priority: Low  Former smokeless tobacco use 11/25/2012    Priority: Low   GERD (gastroesophageal reflux disease)     Priority: Low   Allergic rhinitis due to pollen     Priority: Low   PTSD (post-traumatic stress disorder)     Priority: Low   Pancytopenia (HCC) 02/21/2023   S/P ascending aortic aneurysm repair  09/07/2022   Varicose veins of bilateral lower extremities with other complications 03/01/2013    Past Medical History:  Diagnosis Date   Actinic keratosis    Allergic rhinitis due to pollen    Asthma    vs COPD- taking inhalers- as related to allergies only.   Basal cell carcinoma 03/26/2020   R lat deltoid - ED&C    Dysplastic nevus 03/07/2020   L costal infrapectoral - moderate   Dysplastic nevus 03/07/2020   R post flank above waistline - moderate   Follicular cancer of thyroid  (HCC)    s/p partial thyroidectomy (follicular adenoma)-surgery only   GERD (gastroesophageal reflux disease)    Hearing impaired person, bilateral    hearing aida bilateralhigh frequency loss   Hyperlipidemia    Hypertension    Prostate cancer (HCC) 01/08/2016   PTSD (post-traumatic stress disorder)    s/p multiple active deployments for [employer], no medications taken currently    Past Surgical History:  Procedure Laterality Date   COLONOSCOPY W/ POLYPECTOMY     age 43 adenoma   INGUINAL HERNIA REPAIR     right   LEFT HEART CATH AND CORONARY ANGIOGRAPHY N/A 07/20/2022   Procedure: LEFT HEART CATH AND CORONARY ANGIOGRAPHY;  Surgeon: Wonda Sharper, MD;  [facility] INVASIVE CV LAB;  Service: Cardiovascular;  Laterality: N/A;   REPLACEMENT ASCENDING AORTA N/A 09/07/2022   Procedure: REPLACEMENT ASCENDING AORTA;  Surgeon: Lucas Dorise POUR, MD;  Location: [facility] OR;  Service: Open Heart Surgery;  Laterality: N/A;  WITH CIRC ARREST   ROBOT ASSISTED LAPAROSCOPIC RADICAL PROSTATECTOMY N/A 07/23/2016   Procedure: XI ROBOTIC ASSISTED LAPAROSCOPIC RADICAL PROSTATECTOMY LEVEL 1;  Surgeon: Gretel Ferrara, MD;  Location: [facility] ORS;  Service: Urology;  Laterality: N/A;   SHOULDER ARTHROSCOPY WITH ROTATOR CUFF REPAIR AND SUBACROMIAL DECOMPRESSION Right 05/27/2023   Procedure: SHOULDER ARTHROSCOPY WITH ROTATOR CUFF REPAIR AND SUBACROMIAL DECOMPRESSION;  Surgeon: Dozier Soulier, MD;  Location: [facility] ORS;  Service:  Orthopedics;  Laterality: Right;  NEEDS 90 MINUTES   TEE WITHOUT CARDIOVERSION N/A 09/07/2022   Procedure: TRANSESOPHAGEAL ECHOCARDIOGRAM (TEE);  Surgeon: Lucas Dorise POUR, MD;  Location: [facility] OR;  Service: Open Heart Surgery;  Laterality: N/A;   THYROIDECTOMY, PARTIAL     [name] ([facility])    Family History  Problem Relation Age of Onset   Heart disease Paternal Grandmother    Prostate cancer Father        Recurrent x 3   Hyperlipidemia Father    Other Father        varicose veins   Hyperlipidemia Mother    Other Mother        varicose veins   Hypertension Maternal Grandmother    Hypertension Maternal Grandfather    Sudden death Brother 14   Colon cancer Neg Hx    Esophageal cancer Neg Hx    Rectal cancer Neg Hx    Stomach cancer Neg Hx     Social History   Social History Narrative   [name] is his preferred name   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  Review of Systems: Pertinent positives are listed above.  Otherwise, a full 14 point review of systems has been done in full and it is negative except where it is noted positive.  Objective:   There were no vitals taken for this visit. Ideal Body Weight:    Ideal Body Weight:   No results found.    02/16/2023   10:17 AM 02/15/2023   10:39 AM 12/12/2020    8:45 AM 06/21/2019    2:05 PM 12/16/2017    9:18 AM  Depression screen PHQ 2/9  Decreased Interest   0 0 1  Down, Depressed, Hopeless   0 0 1  PHQ - 2 Score   0 0 2  Altered sleeping   0  1  Tired, decreased energy   0  1  Change in appetite   0  1  Feeling bad or failure about yourself    0  0  Trouble concentrating   0  1  Moving slowly or fidgety/restless   0  1  Suicidal thoughts   0  0  PHQ-9  Score   0  7  Difficult doing work/chores   Not difficult at all       Information is confidential and restricted. Go to Review Flowsheets to unlock data.     GEN: well developed, well nourished, no acute distress Eyes: conjunctiva and lids normal, PERRLA, EOMI ENT: TM clear, nares clear, oral exam WNL Neck: supple, no lymphadenopathy, no thyromegaly, no JVD Pulm: clear to auscultation and percussion, respiratory effort normal CV: regular rate and rhythm, S1-S2, no murmur, rub or gallop, no bruits, peripheral pulses normal and symmetric, no cyanosis, clubbing, edema or varicosities GI: soft, non-tender; no hepatosplenomegaly, masses; active bowel sounds all quadrants GU: deferred Lymph: no cervical, axillary or inguinal adenopathy MSK: gait normal, muscle tone and strength WNL, no joint swelling, effusions, discoloration, crepitus  SKIN: clear, good turgor, color WNL, no rashes, lesions, or ulcerations Neuro: normal mental status, normal strength, sensation, and motion Psych: alert; oriented to person, place and time, normally interactive and not anxious or depressed in appearance.  All labs reviewed with patient. Results for orders placed or performed during the hospital encounter of 04/10/24  Gene[employer] Molecular Screen - Blood ( Clinical Lab)   Collection Time: 04/10/24  3:27 PM  Result Value Ref Range   Genetic Analysis Overall Interpretation Negative    Genetic Disease Assessed      This is a screening test and does not detect all pathogenic or likely pathogenic variant(s) in the tested genes; diagnostic testing is recommended for individuals with a personal or family history of heart disease or hereditary cancer. [employer] Tier One  Population Screen is a screening test that analyzes 11 genes related to hereditary breast and ovarian cancer (HBOC) syndrome, Lynch syndrome, and familial hypercholesterolemia. This test only reports clinically significant pathogenic and likely   pathogenic variants but does not report variants of uncertain significance (VUS). In addition, analysis of the PMS2 gene excludes exons 11-15, which overlap with a known pseudogene (PMS2CL).    Genetic Analysis Report      No pathogenic or likely pathogenic variants were detected in the genes analyzed by this test.Genetic test results should be interpreted in the context of an individual's  personal medical and family history. Alteration to medical management is NOT  recommended based solely on this result. Clinical correlation is advised.Additional Considerations- This is a screening test; individuals may still carry pathogenic or likely pathogenic variant(s) in the tested genes that are not detected by this test.-  For individuals at risk for these or other related conditions based on factors including personal or family history, diagnostic testing is recommended.- The absence of pathogenic or likely pathogenic variant(s) in the analyzed genes, while reassuring,  does not eliminate the possibility of a hereditary condition; there are other variants and genes associated with heart disease and hereditary cancer that are not included in this test.    Genes Tested See Notes    Disclaimer See Notes    Sequencing Location See Notes    Interpretation Methods and Limitations See Notes     Assessment and Plan:     ICD-10-CM   1. Healthcare maintenance  Z00.00      Assessment & Plan   Health Maintenance Exam: The patient's preventative maintenance and recommended screening tests for an annual wellness exam were reviewed in full today. Brought up to date unless services declined.  Counselled on the importance of diet, exercise, and its role in overall health and mortality. The patient's FH and SH was reviewed, including their home life, tobacco status, and drug and alcohol  status.  Follow-up in 1 year for physical exam or additional follow-up below.  Disposition: No follow-ups on file.  No  orders of the defined types were placed in this encounter.  There are no discontinued medications. No orders of the defined types were placed in this encounter.   Signed,  Jacques DASEN. Treysean Petruzzi, MD   Allergies as of 05/03/2024       Reactions   Vioxx [rofecoxib] Anaphylaxis, Other (See Comments)   Tolerates aspirin  and naproxen .   Celebrex [celecoxib] Rash, Other (See Comments)   Tolerates aspirin  and naproxen .   Doxycycline Rash        Medication List        Accurate as of May 01, 2024  9:00 AM. If you have any questions, ask your nurse or doctor.          albuterol  108 (90 Base) MCG/ACT inhaler Commonly known as: VENTOLIN  HFA Inhale 2 puffs into the lungs every 4 (four) hours as needed for wheezing or shortness of breath.   aspirin  EC 81 MG tablet Take 81 mg by mouth daily. Swallow whole.   budesonide -formoterol  160-4.5 MCG/ACT inhaler Commonly known as: SYMBICORT  Inhale 2 puffs into the lungs in the morning and at bedtime.   cetirizine 10 MG tablet Commonly known as: ZYRTEC Take 10 mg by mouth daily.   escitalopram  10 MG tablet Commonly known as: LEXAPRO  Take 1 tablet (10 mg total) by mouth daily at 8am   fluticasone  50 MCG/ACT nasal spray Commonly known as: FLONASE  Place 2 sprays into both nostrils daily.   losartan  25 MG tablet Commonly known as: COZAAR  Take 1 tablet (25 mg total) by mouth daily.   metoprolol  tartrate 25 MG tablet Commonly known as: LOPRESSOR  Take 1 tablet (25 mg total) by mouth daily.   montelukast  10 MG tablet Commonly known as: SINGULAIR  Take 1 tablet (10 mg total) by mouth daily.   multivitamin with minerals Tabs tablet Take 1 tablet by mouth daily.   naltrexone  50 MG tablet Commonly known as: DEPADE Take 1 tablet (50 mg total) by mouth daily at 8am   omeprazole  40 MG capsule Commonly  known as: PRILOSEC Take 1 capsule (40 mg total) by mouth daily.   ondansetron  4 MG tablet Commonly known as: Zofran  Take 1  tablet (4 mg total) by mouth every 8 (eight) hours as needed for nausea or vomiting.   tadalafil  5 MG tablet Commonly known as: CIALIS  Take 5 mg by mouth daily.   tadalafil  5 MG tablet Commonly known as: CIALIS  Take 1 tablet (5 mg total) by mouth daily.   traZODone  50 MG tablet Commonly known as: DESYREL  Take 1 tablet (50 mg total) by mouth daily at 9:00 PM ** 24 hour max: 1 tablet **

## 2024-05-03 ENCOUNTER — Encounter: Payer: Self-pay | Admitting: Family Medicine

## 2024-05-03 ENCOUNTER — Ambulatory Visit: Admitting: Family Medicine

## 2024-05-03 VITALS — BP 100/60 | HR 60 | Temp 97.1°F | Ht 72.25 in | Wt 206.5 lb

## 2024-05-03 DIAGNOSIS — Z23 Encounter for immunization: Secondary | ICD-10-CM

## 2024-05-03 DIAGNOSIS — M1712 Unilateral primary osteoarthritis, left knee: Secondary | ICD-10-CM

## 2024-05-03 DIAGNOSIS — Z131 Encounter for screening for diabetes mellitus: Secondary | ICD-10-CM | POA: Diagnosis not present

## 2024-05-03 DIAGNOSIS — Z0001 Encounter for general adult medical examination with abnormal findings: Secondary | ICD-10-CM

## 2024-05-03 DIAGNOSIS — Z79899 Other long term (current) drug therapy: Secondary | ICD-10-CM

## 2024-05-03 DIAGNOSIS — E782 Mixed hyperlipidemia: Secondary | ICD-10-CM | POA: Diagnosis not present

## 2024-05-03 DIAGNOSIS — C73 Malignant neoplasm of thyroid gland: Secondary | ICD-10-CM | POA: Diagnosis not present

## 2024-05-03 DIAGNOSIS — Z0000 Encounter for general adult medical examination without abnormal findings: Secondary | ICD-10-CM

## 2024-05-03 LAB — CBC WITH DIFFERENTIAL/PLATELET
Basophils Absolute: 0 K/uL (ref 0.0–0.1)
Basophils Relative: 0.7 % (ref 0.0–3.0)
Eosinophils Absolute: 0.3 K/uL (ref 0.0–0.7)
Eosinophils Relative: 6.8 % — ABNORMAL HIGH (ref 0.0–5.0)
HCT: 43.3 % (ref 39.0–52.0)
Hemoglobin: 14.3 g/dL (ref 13.0–17.0)
Lymphocytes Relative: 21.5 % (ref 12.0–46.0)
Lymphs Abs: 0.9 K/uL (ref 0.7–4.0)
MCHC: 33 g/dL (ref 30.0–36.0)
MCV: 92.7 fl (ref 78.0–100.0)
Monocytes Absolute: 0.4 K/uL (ref 0.1–1.0)
Monocytes Relative: 9.8 % (ref 3.0–12.0)
Neutro Abs: 2.7 K/uL (ref 1.4–7.7)
Neutrophils Relative %: 61.2 % (ref 43.0–77.0)
Platelets: 282 K/uL (ref 150.0–400.0)
RBC: 4.67 Mil/uL (ref 4.22–5.81)
RDW: 13.4 % (ref 11.5–15.5)
WBC: 4.4 K/uL (ref 4.0–10.5)

## 2024-05-03 LAB — BASIC METABOLIC PANEL WITH GFR
BUN: 11 mg/dL (ref 6–23)
CO2: 30 meq/L (ref 19–32)
Calcium: 9 mg/dL (ref 8.4–10.5)
Chloride: 101 meq/L (ref 96–112)
Creatinine, Ser: 1.03 mg/dL (ref 0.40–1.50)
GFR: 78.24 mL/min (ref >60.00)
Glucose, Bld: 100 mg/dL — ABNORMAL HIGH (ref 70–99)
Potassium: 4 meq/L (ref 3.5–5.1)
Sodium: 137 meq/L (ref 135–145)

## 2024-05-03 LAB — T4, FREE: Free T4: 0.77 ng/dL (ref 0.60–1.60)

## 2024-05-03 LAB — HEPATIC FUNCTION PANEL
ALT: 17 U/L (ref 0–53)
AST: 23 U/L (ref 0–37)
Albumin: 4.4 g/dL (ref 3.5–5.2)
Alkaline Phosphatase: 61 U/L (ref 39–117)
Bilirubin, Direct: 0.1 mg/dL (ref 0.0–0.3)
Total Bilirubin: 0.8 mg/dL (ref 0.2–1.2)
Total Protein: 6.6 g/dL (ref 6.0–8.3)

## 2024-05-03 LAB — TSH: TSH: 2.54 u[IU]/mL (ref 0.35–5.50)

## 2024-05-03 LAB — LIPID PANEL
Cholesterol: 268 mg/dL — ABNORMAL HIGH (ref 0–200)
HDL: 57.2 mg/dL (ref >39.00)
LDL Cholesterol: 183 mg/dL — ABNORMAL HIGH (ref 0–99)
NonHDL: 210.71
Total CHOL/HDL Ratio: 5
Triglycerides: 139 mg/dL (ref 0.0–149.0)
VLDL: 27.8 mg/dL (ref 0.0–40.0)

## 2024-05-03 LAB — T3, FREE: T3, Free: 3.6 pg/mL (ref 2.3–4.2)

## 2024-05-03 LAB — HEMOGLOBIN A1C: Hgb A1c MFr Bld: 5.8 % (ref 4.6–6.5)

## 2024-05-03 MED ORDER — TRIAMCINOLONE ACETONIDE 40 MG/ML IJ SUSP
40.0000 mg | Freq: Once | INTRAMUSCULAR | Status: AC
  Administered 2024-05-03: 40 mg via INTRA_ARTICULAR

## 2024-05-03 NOTE — Patient Instructions (Signed)
 Covid booster  RSV vaccine

## 2024-05-04 ENCOUNTER — Ambulatory Visit: Payer: Self-pay | Admitting: Family Medicine

## 2024-05-04 DIAGNOSIS — E782 Mixed hyperlipidemia: Secondary | ICD-10-CM

## 2024-05-04 DIAGNOSIS — Z79899 Other long term (current) drug therapy: Secondary | ICD-10-CM

## 2024-05-06 ENCOUNTER — Other Ambulatory Visit (HOSPITAL_COMMUNITY): Payer: Self-pay

## 2024-05-06 MED ORDER — ATORVASTATIN CALCIUM 40 MG PO TABS
40.0000 mg | ORAL_TABLET | Freq: Every day | ORAL | 3 refills | Status: AC
Start: 2024-05-06 — End: ?
  Filled 2024-05-06: qty 90, 90d supply, fill #0

## 2024-05-08 ENCOUNTER — Encounter: Admitting: Family Medicine

## 2024-05-10 ENCOUNTER — Other Ambulatory Visit (HOSPITAL_COMMUNITY): Payer: Self-pay

## 2024-05-11 ENCOUNTER — Other Ambulatory Visit: Payer: Self-pay | Admitting: Family Medicine

## 2024-05-11 DIAGNOSIS — K5792 Diverticulitis of intestine, part unspecified, without perforation or abscess without bleeding: Secondary | ICD-10-CM

## 2024-05-15 ENCOUNTER — Other Ambulatory Visit: Payer: Self-pay

## 2024-05-15 ENCOUNTER — Other Ambulatory Visit (HOSPITAL_COMMUNITY): Payer: Self-pay

## 2024-05-15 MED ORDER — ONDANSETRON HCL 4 MG PO TABS
4.0000 mg | ORAL_TABLET | Freq: Three times a day (TID) | ORAL | 1 refills | Status: AC | PRN
  Filled 2024-05-15: qty 20, 7d supply, fill #0

## 2024-05-15 NOTE — Telephone Encounter (Signed)
 Zofran  Last filled:  04/20/23, #20 Last OV:  05/03/24, CPE Next OV:  none

## 2024-05-18 ENCOUNTER — Other Ambulatory Visit: Payer: Self-pay

## 2024-06-07 ENCOUNTER — Other Ambulatory Visit: Payer: Self-pay

## 2024-06-07 ENCOUNTER — Other Ambulatory Visit (HOSPITAL_COMMUNITY): Payer: Self-pay

## 2024-06-13 ENCOUNTER — Other Ambulatory Visit (HOSPITAL_COMMUNITY): Payer: Self-pay

## 2024-06-14 ENCOUNTER — Other Ambulatory Visit (HOSPITAL_COMMUNITY): Payer: Self-pay

## 2024-06-14 MED ORDER — BUSPIRONE HCL 15 MG PO TABS
ORAL_TABLET | ORAL | 0 refills | Status: AC
  Filled 2024-06-14: qty 60, 30d supply, fill #0

## 2024-06-14 MED ORDER — PRAZOSIN HCL 1 MG PO CAPS
ORAL_CAPSULE | ORAL | 0 refills | Status: AC
  Filled 2024-06-14: qty 30, 30d supply, fill #0

## 2024-06-30 ENCOUNTER — Other Ambulatory Visit: Payer: Self-pay

## 2024-06-30 ENCOUNTER — Other Ambulatory Visit: Payer: Self-pay | Admitting: Family Medicine

## 2024-06-30 ENCOUNTER — Other Ambulatory Visit (HOSPITAL_COMMUNITY): Payer: Self-pay

## 2024-06-30 MED ORDER — OMEPRAZOLE 40 MG PO CPDR
40.0000 mg | DELAYED_RELEASE_CAPSULE | Freq: Every day | ORAL | 3 refills | Status: AC
  Filled 2024-06-30 (×2): qty 90, 90d supply, fill #0

## 2024-07-03 ENCOUNTER — Other Ambulatory Visit: Payer: Self-pay | Admitting: Family Medicine

## 2024-07-03 ENCOUNTER — Other Ambulatory Visit (HOSPITAL_COMMUNITY): Payer: Self-pay

## 2024-07-03 MED ORDER — METOPROLOL TARTRATE 25 MG PO TABS
25.0000 mg | ORAL_TABLET | Freq: Every day | ORAL | 3 refills | Status: AC
  Filled 2024-07-03: qty 90, 90d supply, fill #0

## 2024-07-03 MED ORDER — ESCITALOPRAM OXALATE 10 MG PO TABS
10.0000 mg | ORAL_TABLET | Freq: Every day | ORAL | 3 refills | Status: AC
  Filled 2024-07-03: qty 90, 90d supply, fill #0

## 2024-07-07 MED FILL — Montelukast Sodium Tab 10 MG (Base Equiv): ORAL | 90 days supply | Qty: 90 | Fill #2 | Status: AC

## 2024-07-08 ENCOUNTER — Other Ambulatory Visit (HOSPITAL_COMMUNITY): Payer: Self-pay

## 2024-07-10 ENCOUNTER — Other Ambulatory Visit: Payer: Self-pay

## 2024-07-10 MED ORDER — ESCITALOPRAM OXALATE 20 MG PO TABS
20.0000 mg | ORAL_TABLET | Freq: Every day | ORAL | 0 refills | Status: AC
  Filled 2024-07-11: qty 30, 30d supply, fill #0

## 2024-07-11 ENCOUNTER — Other Ambulatory Visit (HOSPITAL_COMMUNITY): Payer: Self-pay

## 2024-07-11 ENCOUNTER — Other Ambulatory Visit: Payer: Self-pay

## 2024-07-12 ENCOUNTER — Other Ambulatory Visit: Payer: Self-pay

## 2024-07-18 ENCOUNTER — Other Ambulatory Visit (HOSPITAL_COMMUNITY): Payer: Self-pay

## 2024-08-04 ENCOUNTER — Other Ambulatory Visit (HOSPITAL_COMMUNITY): Payer: Self-pay

## 2025-01-24 ENCOUNTER — Ambulatory Visit: Admitting: Dermatology
